# Patient Record
Sex: Female | Born: 1960 | Race: Black or African American | Hispanic: No | Smoking: Former smoker
Health system: Southern US, Community
[De-identification: ages and names within clinical notes are randomized; demographics above are authoritative.]

## PROBLEM LIST (undated history)

## (undated) DIAGNOSIS — C50919 Malignant neoplasm of unspecified site of unspecified female breast: Secondary | ICD-10-CM

## (undated) DIAGNOSIS — K5010 Crohn's disease of large intestine without complications: Secondary | ICD-10-CM

## (undated) DIAGNOSIS — K219 Gastro-esophageal reflux disease without esophagitis: Secondary | ICD-10-CM

## (undated) DIAGNOSIS — R7303 Prediabetes: Secondary | ICD-10-CM

## (undated) DIAGNOSIS — I10 Essential (primary) hypertension: Secondary | ICD-10-CM

## (undated) HISTORY — DX: Essential (primary) hypertension: I10

## (undated) HISTORY — PX: MYOMECTOMY: SHX85

## (undated) HISTORY — DX: Malignant neoplasm of unspecified site of unspecified female breast: C50.919

## (undated) HISTORY — PX: BREAST SURGERY: SHX581

## (undated) HISTORY — PX: TONSILLECTOMY: SUR1361

## (undated) HISTORY — PX: BUNIONECTOMY: SHX129

---

## 2006-02-23 ENCOUNTER — Encounter: Admission: RE | Admit: 2006-02-23 | Discharge: 2006-02-23 | Payer: Self-pay | Admitting: Obstetrics & Gynecology

## 2006-03-24 ENCOUNTER — Encounter: Admission: RE | Admit: 2006-03-24 | Discharge: 2006-03-24 | Payer: Self-pay | Admitting: Internal Medicine

## 2006-10-10 ENCOUNTER — Ambulatory Visit: Payer: Self-pay

## 2007-04-24 ENCOUNTER — Encounter: Admission: RE | Admit: 2007-04-24 | Discharge: 2007-04-24 | Payer: Self-pay | Admitting: Obstetrics & Gynecology

## 2007-05-02 ENCOUNTER — Encounter: Admission: RE | Admit: 2007-05-02 | Discharge: 2007-05-02 | Payer: Self-pay | Admitting: Obstetrics & Gynecology

## 2008-01-18 ENCOUNTER — Ambulatory Visit: Payer: Self-pay

## 2008-07-03 ENCOUNTER — Encounter: Admission: RE | Admit: 2008-07-03 | Discharge: 2008-07-03 | Payer: Self-pay | Admitting: Internal Medicine

## 2009-07-14 ENCOUNTER — Encounter: Admission: RE | Admit: 2009-07-14 | Discharge: 2009-07-14 | Payer: Self-pay | Admitting: Internal Medicine

## 2010-04-21 ENCOUNTER — Other Ambulatory Visit: Admission: RE | Admit: 2010-04-21 | Discharge: 2010-04-21 | Payer: Self-pay | Admitting: Internal Medicine

## 2010-07-19 ENCOUNTER — Encounter: Payer: Self-pay | Admitting: Obstetrics & Gynecology

## 2010-08-05 ENCOUNTER — Other Ambulatory Visit: Payer: Self-pay | Admitting: Internal Medicine

## 2010-08-05 DIAGNOSIS — Z1231 Encounter for screening mammogram for malignant neoplasm of breast: Secondary | ICD-10-CM

## 2010-08-12 ENCOUNTER — Ambulatory Visit
Admission: RE | Admit: 2010-08-12 | Discharge: 2010-08-12 | Disposition: A | Discharge status: Home / Self Care | Payer: Federal, State, Local not specified - PPO | Source: Ambulatory Visit | Attending: Internal Medicine | Admitting: Internal Medicine

## 2010-08-12 DIAGNOSIS — Z1231 Encounter for screening mammogram for malignant neoplasm of breast: Secondary | ICD-10-CM

## 2011-07-15 ENCOUNTER — Other Ambulatory Visit: Payer: Self-pay | Admitting: Internal Medicine

## 2011-07-15 DIAGNOSIS — Z1231 Encounter for screening mammogram for malignant neoplasm of breast: Secondary | ICD-10-CM

## 2011-08-20 ENCOUNTER — Ambulatory Visit: Payer: Federal, State, Local not specified - PPO

## 2011-08-27 ENCOUNTER — Ambulatory Visit
Admission: RE | Admit: 2011-08-27 | Discharge: 2011-08-27 | Disposition: A | Discharge status: Home / Self Care | Payer: Federal, State, Local not specified - PPO | Source: Ambulatory Visit | Attending: Internal Medicine | Admitting: Internal Medicine

## 2011-08-27 DIAGNOSIS — Z1231 Encounter for screening mammogram for malignant neoplasm of breast: Secondary | ICD-10-CM

## 2012-08-14 ENCOUNTER — Other Ambulatory Visit: Payer: Self-pay | Admitting: Internal Medicine

## 2012-08-14 DIAGNOSIS — Z1231 Encounter for screening mammogram for malignant neoplasm of breast: Secondary | ICD-10-CM

## 2012-09-01 ENCOUNTER — Ambulatory Visit: Payer: Federal, State, Local not specified - PPO

## 2012-09-22 ENCOUNTER — Other Ambulatory Visit (HOSPITAL_COMMUNITY)
Admission: RE | Admit: 2012-09-22 | Discharge: 2012-09-22 | Disposition: A | Discharge status: Home / Self Care | Payer: Federal, State, Local not specified - PPO | Source: Ambulatory Visit | Attending: Internal Medicine | Admitting: Internal Medicine

## 2012-09-22 DIAGNOSIS — Z01419 Encounter for gynecological examination (general) (routine) without abnormal findings: Secondary | ICD-10-CM | POA: Insufficient documentation

## 2012-10-27 ENCOUNTER — Ambulatory Visit: Payer: Federal, State, Local not specified - PPO

## 2012-12-08 ENCOUNTER — Ambulatory Visit
Admission: RE | Admit: 2012-12-08 | Discharge: 2012-12-08 | Disposition: A | Discharge status: Home / Self Care | Payer: Federal, State, Local not specified - PPO | Source: Ambulatory Visit | Attending: Internal Medicine | Admitting: Internal Medicine

## 2012-12-08 DIAGNOSIS — Z1231 Encounter for screening mammogram for malignant neoplasm of breast: Secondary | ICD-10-CM

## 2013-01-12 ENCOUNTER — Encounter: Payer: Self-pay | Admitting: Internal Medicine

## 2014-04-23 ENCOUNTER — Emergency Department (HOSPITAL_COMMUNITY)
Admission: EM | Admit: 2014-04-23 | Discharge: 2014-04-23 | Disposition: A | Discharge status: Home / Self Care | Payer: Federal, State, Local not specified - PPO | Source: Home / Self Care | Attending: Family Medicine | Admitting: Family Medicine

## 2014-04-23 ENCOUNTER — Encounter (HOSPITAL_COMMUNITY): Payer: Self-pay | Admitting: Emergency Medicine

## 2014-04-23 DIAGNOSIS — L237 Allergic contact dermatitis due to plants, except food: Secondary | ICD-10-CM

## 2014-04-23 DIAGNOSIS — IMO0001 Reserved for inherently not codable concepts without codable children: Secondary | ICD-10-CM

## 2014-04-23 DIAGNOSIS — R030 Elevated blood-pressure reading, without diagnosis of hypertension: Secondary | ICD-10-CM

## 2014-04-23 MED ORDER — PREDNISONE 5 MG PO KIT: PACK | ORAL | Status: DC

## 2014-04-23 MED ORDER — CLOBETASOL PROPIONATE 0.05 % EX OINT: 1.0000 "application " | TOPICAL_OINTMENT | Freq: Two times a day (BID) | CUTANEOUS | Status: DC

## 2014-04-23 NOTE — ED Provider Notes (Signed)
Rita Cole is a 53 y.o. female who presents to Urgent Care today for rash. Patient notes a several day history of rash on her bilateral forearms. Patient was trimming vines in her yard and thinks she was exposed to poison ivy which she is sensitive to. She has  tried over-the-counter anti-itch medicines which have helped a little. In the past prednisone and clobetasol have helped a lot. No mouth swelling or trouble breathing. No chest pains palpitations. Patient denies any history of hypertension.  History reviewed. No pertinent past medical history. History  Substance Use Topics  . Smoking status: Never Smoker   . Smokeless tobacco: Not on file  . Alcohol Use: No   ROS as above Medications: No current facility-administered medications for this encounter.   Current Outpatient Prescriptions  Medication Sig Dispense Refill  . clobetasol ointment (TEMOVATE) 6.07 % Apply 1 application topically 2 (two) times daily.  30 g  0  . PredniSONE 5 MG KIT 12 day dosepack po  1 kit  0    Exam:  BP 193/86  Pulse 73  Temp(Src) 98.1 F (36.7 C) (Oral)  Resp 18  SpO2 99% Gen: Well NAD HEENT: EOMI,  MMM no mouth swelling or oral lesions.  Lungs: Normal work of breathing. CTABL Heart: RRR no MRG Abd: NABS, Soft. Nondistended, Nontender Exts: Brisk capillary refill, warm and well perfused.  Skin: Vesicular street rash bilateral upper arms consistent with poison ivy dermatitis  No results found for this or any previous visit (from the past 24 hour(s)). No results found.  Assessment and Plan: 53 y.o. female with  irritant contact dermatitis due to poison ivy. Treat with prednisone Dosepak and clobetasol ointment.  Recommend patient follow up with the primary care provider sent for elevated blood pressure.  Discussed warning signs or symptoms. Please see discharge instructions. Patient expresses understanding.     Gregor Hams, MD 04/23/14 [phone]

## 2014-04-23 NOTE — ED Notes (Signed)
C/o rash on arms and hands.  States taking off branches of trees over the weekend.  gradually getting worse and having irritation.

## 2014-04-23 NOTE — Discharge Instructions (Signed)
Thank you for coming in today. Take prednisone daily for 12 days. Use clobetasol ointment as needed. Follow-up with your primary care provider soon for blood pressure issues. Call or go to the emergency room if you get worse, have trouble breathing, have chest pains, or palpitations.    Poison [employer] ivy is a inflammation of the skin (contact dermatitis) caused by touching the allergens on the leaves of the ivy plant following previous exposure to the plant. The rash usually appears 48 hours after exposure. The rash is usually bumps (papules) or blisters (vesicles) in a linear pattern. Depending on your own sensitivity, the rash may simply cause redness and itching, or it may also progress to blisters which may break open. These must be well cared for to prevent secondary bacterial (germ) infection, followed by scarring. Keep any open areas dry, clean, dressed, and covered with an antibacterial ointment if needed. The eyes may also get puffy. The puffiness is worst in the morning and gets better as the day progresses. This dermatitis usually heals without scarring, within 2 to 3 weeks without treatment. HOME CARE INSTRUCTIONS  Thoroughly wash with soap and water as soon as you have been exposed to poison ivy. You have about one half hour to remove the plant resin before it will cause the rash. This washing will destroy the oil or antigen on the skin that is causing, or will cause, the rash. Be sure to wash under your fingernails as any plant resin there will continue to spread the rash. Do not rub skin vigorously when washing affected area. Poison ivy cannot spread if no oil from the plant remains on your body. A rash that has progressed to weeping sores will not spread the rash unless you have not washed thoroughly. It is also important to wash any clothes you have been wearing as these may carry active allergens. The rash will return if you wear the unwashed clothing, even several days  later. Avoidance of the plant in the future is the best measure. Poison ivy plant can be recognized by the number of leaves. Generally, poison ivy has three leaves with flowering branches on a single stem. Diphenhydramine may be purchased over the counter and used as needed for itching. Do not drive with this medication if it makes you drowsy.Ask your caregiver about medication for children. SEEK MEDICAL CARE IF:  Open sores develop.  Redness spreads beyond area of rash.  You notice purulent (pus-like) discharge.  You have increased pain.  Other signs of infection develop (such as fever). Document Released: 06/11/2000 Document Revised: 09/06/2011 Document Reviewed: 11/22/2008 [facility] Patient Information 2015 [place], [place]. This information is not intended to replace advice given to you by your health care provider. Make sure you discuss any questions you have with your health care provider.

## 2014-07-24 ENCOUNTER — Other Ambulatory Visit (INDEPENDENT_AMBULATORY_CARE_PROVIDER_SITE_OTHER): Payer: Self-pay | Admitting: Surgery

## 2014-11-14 ENCOUNTER — Encounter (HOSPITAL_COMMUNITY): Payer: Self-pay | Admitting: Emergency Medicine

## 2014-11-14 ENCOUNTER — Emergency Department (HOSPITAL_COMMUNITY)
Admission: EM | Admit: 2014-11-14 | Discharge: 2014-11-14 | Disposition: A | Discharge status: Home / Self Care | Payer: Federal, State, Local not specified - PPO | Source: Home / Self Care

## 2014-11-14 DIAGNOSIS — L200 Besnier's prurigo: Secondary | ICD-10-CM | POA: Diagnosis not present

## 2014-11-14 DIAGNOSIS — L239 Allergic contact dermatitis, unspecified cause: Secondary | ICD-10-CM

## 2014-11-14 MED ORDER — DEXAMETHASONE 4 MG PO TABS
ORAL_TABLET | ORAL | Status: AC
  Filled 2014-11-14: qty 2

## 2014-11-14 MED ORDER — PREDNISONE 50 MG PO TABS: ORAL_TABLET | ORAL | Status: DC

## 2014-11-14 MED ORDER — DEXAMETHASONE 2 MG PO TABS
ORAL_TABLET | ORAL | Status: AC
  Filled 2014-11-14: qty 1

## 2014-11-14 MED ORDER — DEXAMETHASONE 4 MG PO TABS
10.0000 mg | ORAL_TABLET | Freq: Once | ORAL | Status: AC
  Administered 2014-11-14: 10 mg via ORAL

## 2014-11-14 NOTE — Discharge Instructions (Signed)
Your rash is likely due to something he came into contact with her environment. They're given a dose of steroids in our clinic to help start the healing process. Please take your steroids as prescribed for the full dose. Please use Benadryl 50 mg for additional relief.

## 2014-11-14 NOTE — ED Notes (Signed)
Reports working out in the yard on Sunday.  Developed a rash on hands, arms, and face.  Pt has used otc creams/ointments with no relief.

## 2014-11-14 NOTE — ED Provider Notes (Addendum)
CSN: [id number]     Arrival date & time 11/14/14  1543 History   None    Chief Complaint  Patient presents with  . Rash   (Consider location/radiation/quality/duration/timing/severity/associated sxs/prior Treatment) HPI  Patient was working in her yard on Sunday when she started developing a tingling sensation in her skin. Was working on trimming bushes and trees. Following day she developed itchy rash. Rarely on her arms and now on her face. Clear pustules. Nontender. Calamine lotion without improvement. Benadryl with some improvement in the itch. Progressive and constant.  Denies dysphagia, dyspnea, shortness breath, palpitations, tongue swelling, runny nose, congestion, cough, chest pain, bone pain, dysuria, back pain.  History reviewed. No pertinent past medical history. History reviewed. No pertinent past surgical history. Family History  Problem Relation Age of Onset  . Heart attack Father    History  Substance Use Topics  . Smoking status: Never Smoker   . Smokeless tobacco: Not on file  . Alcohol Use: No   OB History    No data available     Review of Systems Per HPI with all other pertinent systems negative.   Allergies  Review of patient's allergies indicates no known allergies.  Home Medications   Prior to Admission medications   Medication Sig Start Date End Date Taking? Authorizing Provider  clobetasol ointment (TEMOVATE) 5.99 % Apply 1 application topically 2 (two) times daily. 04/23/14   Gregor Hams, MD  predniSONE (DELTASONE) 50 MG tablet Take daily with breakfast 11/14/14   Waldemar Dickens, MD   BP 197/77 mmHg  Pulse 81  Temp(Src) 97.9 F (36.6 C) (Oral)  Resp 18  SpO2 95% Physical Exam Physical Exam  Constitutional: oriented to person, place, and time. appears well-developed and well-nourished. No distress.  HENT:  Head: Normocephalic and atraumatic.  Eyes: EOMI. PERRL.  Neck: Normal range of motion.  Cardiovascular: RRR, no m/r/g, 2+ distal  pulses,  Pulmonary/Chest: Effort normal and breath sounds normal. No respiratory distress.  Abdominal: Soft. Bowel sounds are normal. NonTTP, no distension.  Musculoskeletal: Normal range of motion. Non ttp, no effusion.  Neurological: alert and oriented to person, place, and time.  Skin: Maculopapular rash on arms and face with few clear vesicles.Marland Kitchen  Psychiatric: normal mood and affect. behavior is normal. Judgment and thought content normal.    ED Course  Procedures (including critical care time) Labs Review Labs Reviewed - No data to display  Imaging Review No results found.   MDM   1. Allergic dermatitis    Decadron 10 mg by mouth in office.  Continue Benadryl 50 mg. Start steroid Dosepak. Precautions given and all questions answered.  Hypertension: Blood pressure elevated likely secondary to acute illness and patient stress. Patient to follow-up with PCP if remains elevated in the future.  Waldemar Dickens, MD 11/14/14 1649  Waldemar Dickens, MD 11/14/14 [phone]

## 2017-03-18 ENCOUNTER — Ambulatory Visit (HOSPITAL_COMMUNITY)
Admission: EM | Admit: 2017-03-18 | Discharge: 2017-03-18 | Disposition: A | Discharge status: Home / Self Care | Payer: Federal, State, Local not specified - PPO | Attending: Internal Medicine | Admitting: Internal Medicine

## 2017-03-18 ENCOUNTER — Encounter (HOSPITAL_COMMUNITY): Payer: Self-pay | Admitting: Emergency Medicine

## 2017-03-18 DIAGNOSIS — L237 Allergic contact dermatitis due to plants, except food: Secondary | ICD-10-CM

## 2017-03-18 MED ORDER — TRIAMCINOLONE ACETONIDE 0.1 % EX CREA: 1.0000 "application " | TOPICAL_CREAM | Freq: Two times a day (BID) | CUTANEOUS | 0 refills | Status: DC

## 2017-03-18 NOTE — Discharge Instructions (Signed)
Apply the triamcinolone cream to the areas affected as we discussed. Do this twice a day. In between those application she may also use Benadryl gel or cream. Cold compresses neck to the eye and face will help with swelling and itching and burning.

## 2017-03-18 NOTE — ED Provider Notes (Signed)
[place]    CSN: [id number] Arrival date & time: 03/18/17  1641     History   Chief Complaint Chief Complaint  Patient presents with  . Rash    HPI Rita Cole is a 56 y.o. female.   Partially 5 days ago this patient noticed some swelling rash and itchiness in the right periorbital area left upper cheek and a small area along the neckline. This occurred after she was cleaning up some brush in the yard. In the morning she wakes up with puffiness and swelling to the left face and underneath the left eye. Denies actual problems or visual problems. She has tried Benadryl topically without much relief. No pain just itching.      History reviewed. No pertinent past medical history.  There are no active problems to display for this patient.   History reviewed. No pertinent surgical history.  OB History    No data available       Home Medications    Prior to Admission medications   Medication Sig Start Date End Date Taking? Authorizing Provider  clobetasol ointment (TEMOVATE) 0.99 % Apply 1 application topically 2 (two) times daily. 04/23/14   Gregor Hams, MD  predniSONE (DELTASONE) 50 MG tablet Take daily with breakfast 11/14/14   Waldemar Dickens, MD  triamcinolone cream (KENALOG) 0.1 % Apply 1 application topically 2 (two) times daily. 03/18/17   Janne Napoleon, NP    Family History Family History  Problem Relation Age of Onset  . Heart attack Father     Social History Social History  Substance Use Topics  . Smoking status: Never Smoker  . Smokeless tobacco: Never Used  . Alcohol use No     Allergies   Patient has no known allergies.   Review of Systems Review of Systems  Constitutional: Negative.   HENT: Negative for congestion, ear pain, postnasal drip, sore throat and tinnitus.        Minor swelling/bagginess under the left eye and left cheek.  Respiratory: Negative.   Gastrointestinal: Negative.   Skin: Positive for rash.    Neurological: Negative.   All other systems reviewed and are negative.    Physical Exam Triage Vital Signs ED Triage Vitals  Enc Vitals Group     BP 03/18/17 1806 (!) 157/86     Pulse Rate 03/18/17 1806 83     Resp 03/18/17 1806 20     Temp 03/18/17 1806 98.4 F (36.9 C)     Temp Source 03/18/17 1806 Oral     SpO2 03/18/17 1806 98 %     Weight --      Height --      Head Circumference --      Peak Flow --      Pain Score 03/18/17 1807 5     Pain Loc --      Pain Edu? --      Excl. in [place]? --    No data found.   Updated Vital Signs BP (!) 157/86 (BP Location: Left Arm)   Pulse 83   Temp 98.4 F (36.9 C) (Oral)   Resp 20   SpO2 98%   Visual Acuity Right Eye Distance:   Left Eye Distance:   Bilateral Distance:    Right Eye Near:   Left Eye Near:    Bilateral Near:     Physical Exam  Constitutional: She is oriented to person, place, and time. She appears well-developed and well-nourished. No distress.  Eyes: EOM are normal.  Neck: Normal range of motion. Neck supple.  Cardiovascular: Normal rate.   Pulmonary/Chest: Effort normal. No respiratory distress.  Musculoskeletal: She exhibits no edema.  Neurological: She is alert and oriented to person, place, and time. She exhibits normal muscle tone.  Skin: Skin is warm and dry. Capillary refill takes less than 2 seconds.  As per history of present illness. Slightly rough and papular rash to the upper eyelid, beneath the left lower eyelid and cheek. A ring of similar rash around the right anterolateral neck.  Psychiatric: She has a normal mood and affect.  Nursing note and vitals reviewed.    UC Treatments / Results  Labs (all labs ordered are listed, but only abnormal results are displayed) Labs Reviewed - No data to display  EKG  EKG Interpretation None       Radiology No results found.  Procedures Procedures (including critical care time)  Medications Ordered in UC Medications - No data to  display   Initial Impression / Assessment and Plan / UC Course  I have reviewed the triage vital signs and the nursing notes.  Pertinent labs & imaging results that were available during my care of the patient were reviewed by me and considered in my medical decision making (see chart for details).    Apply the triamcinolone cream to the areas affected as we discussed. Do this twice a day. In between those application she may also use Benadryl gel or cream. Cold compresses neck to the eye and face will help with swelling and itching and burning.    Final Clinical Impressions(s) / UC Diagnoses   Final diagnoses:  Allergic contact dermatitis due to plants, except food    New Prescriptions New Prescriptions   TRIAMCINOLONE CREAM (KENALOG) 0.1 %    Apply 1 application topically 2 (two) times daily.   Discharge vital provider 1927 hrs.  Controlled Substance Prescriptions [place] Controlled Substance Registry consulted? Not Applicable   Janne Napoleon, NP 03/18/17 [phone]
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
## 2017-05-29 ENCOUNTER — Encounter (HOSPITAL_COMMUNITY): Payer: Self-pay | Admitting: *Deleted

## 2017-05-29 ENCOUNTER — Ambulatory Visit (HOSPITAL_COMMUNITY)
Admission: EM | Admit: 2017-05-29 | Discharge: 2017-05-29 | Disposition: A | Discharge status: Home / Self Care | Payer: Federal, State, Local not specified - PPO | Attending: Urgent Care | Admitting: Urgent Care

## 2017-05-29 ENCOUNTER — Other Ambulatory Visit: Payer: Self-pay

## 2017-05-29 DIAGNOSIS — J019 Acute sinusitis, unspecified: Secondary | ICD-10-CM

## 2017-05-29 DIAGNOSIS — Z9109 Other allergy status, other than to drugs and biological substances: Secondary | ICD-10-CM

## 2017-05-29 DIAGNOSIS — I10 Essential (primary) hypertension: Secondary | ICD-10-CM

## 2017-05-29 DIAGNOSIS — J3489 Other specified disorders of nose and nasal sinuses: Secondary | ICD-10-CM

## 2017-05-29 DIAGNOSIS — R030 Elevated blood-pressure reading, without diagnosis of hypertension: Secondary | ICD-10-CM

## 2017-05-29 MED ORDER — CETIRIZINE HCL 10 MG PO TABS: 10.0000 mg | ORAL_TABLET | Freq: Every day | ORAL | 11 refills | Status: DC

## 2017-05-29 MED ORDER — AMOXICILLIN 500 MG PO CAPS: 500.0000 mg | ORAL_CAPSULE | Freq: Three times a day (TID) | ORAL | 0 refills | Status: DC

## 2017-05-29 NOTE — ED Triage Notes (Addendum)
Per pt she thinks she has sinus infection, per pt her right side is more affected.

## 2017-05-29 NOTE — ED Provider Notes (Signed)
    MRN: [id number] DOB: 11-07-1960  Subjective:   Rita Cole is a 56 y.o. female presenting for 4 day history of worsening right sided sinus pain, facial pain, right ear fullness, nasal congestion (R>L), mild dry cough. Has been using nasal saline and Flonase to address allergies which have been difficult to control for patient this fall season. She has also tried [place] flu. Denies smoking cigarettes.   Cassara takes [facility] and has No Known Allergies.  Jenevie has pmh of HTN. Denies past surgical history.  Objective:   Vitals: BP (!) 155/80 (BP Location: Left Arm) Comment: large cuff  Pulse 78   Temp 97.9 F (36.6 C) (Oral)   Resp (!) 24   SpO2 97%   BP Readings from Last 3 Encounters:  05/29/17 (!) 155/80  03/18/17 (!) 157/86  11/14/14 197/77    Physical Exam  Constitutional: She is oriented to person, place, and time. She appears well-developed and well-nourished.  HENT:  TM's intact bilaterally, no effusions or erythema. Nasal turbinates pink and moist, nasal passages patent. Right sided maxillary sinus tenderness. Throat with moderate post-nasal drainage, mucous membranes moist.   Eyes: Right eye exhibits discharge (clear). Left eye exhibits no discharge.  Neck: Normal range of motion. Neck supple.  Cardiovascular: Normal rate.  Pulmonary/Chest: Effort normal.  Lymphadenopathy:    She has no cervical adenopathy.  Neurological: She is alert and oriented to person, place, and time.  Skin: Skin is warm and dry.   Assessment and Plan :   Acute sinusitis, recurrence not specified, unspecified location  Sinus pain  Environmental allergies  Essential hypertension  Elevated blood pressure reading  Will manage sinusitis secondary to difficult to control allergies with amoxicillin. Hold Flonase for now but use Zyrtec. Recommended supportive care otherwise. Return-to-clinic precautions discussed, patient verbalized understanding. Counseled patient on risks of  uncontrolled HTN. She will consider establishing care for management of her BP.  Jaynee Eagles, PA-C [facility]  05/29/2017  3:55 PM    Jaynee Eagles, PA-C 05/29/17 1624

## 2017-05-29 NOTE — Discharge Instructions (Signed)
If you are interested in letting me help you with your healthcare, please call and set up an appointment to establish care.  [place]

## 2020-09-06 ENCOUNTER — Emergency Department (HOSPITAL_COMMUNITY): Payer: Federal, State, Local not specified - PPO

## 2020-09-06 ENCOUNTER — Other Ambulatory Visit: Payer: Self-pay

## 2020-09-06 ENCOUNTER — Emergency Department (HOSPITAL_COMMUNITY)
Admission: EM | Admit: 2020-09-06 | Discharge: 2020-09-07 | Disposition: A | Discharge status: Home / Self Care | Payer: Federal, State, Local not specified - PPO | Attending: Emergency Medicine | Admitting: Emergency Medicine

## 2020-09-06 DIAGNOSIS — M25552 Pain in left hip: Secondary | ICD-10-CM | POA: Insufficient documentation

## 2020-09-06 DIAGNOSIS — I10 Essential (primary) hypertension: Secondary | ICD-10-CM | POA: Diagnosis not present

## 2020-09-06 DIAGNOSIS — M47812 Spondylosis without myelopathy or radiculopathy, cervical region: Secondary | ICD-10-CM

## 2020-09-06 DIAGNOSIS — R030 Elevated blood-pressure reading, without diagnosis of hypertension: Secondary | ICD-10-CM

## 2020-09-06 DIAGNOSIS — M25532 Pain in left wrist: Secondary | ICD-10-CM | POA: Diagnosis not present

## 2020-09-06 DIAGNOSIS — R000 Tachycardia, unspecified: Secondary | ICD-10-CM | POA: Insufficient documentation

## 2020-09-06 DIAGNOSIS — M542 Cervicalgia: Secondary | ICD-10-CM | POA: Diagnosis present

## 2020-09-06 DIAGNOSIS — M4692 Unspecified inflammatory spondylopathy, cervical region: Secondary | ICD-10-CM | POA: Diagnosis not present

## 2020-09-06 LAB — CBC WITH DIFFERENTIAL/PLATELET
Abs Immature Granulocytes: 0.03 10*3/uL (ref 0.00–0.07)
Basophils Absolute: 0.1 10*3/uL (ref 0.0–0.1)
Basophils Relative: 1 %
Eosinophils Absolute: 0.1 10*3/uL (ref 0.0–0.5)
Eosinophils Relative: 1 %
HCT: 42.4 % (ref 36.0–46.0)
Hemoglobin: 13.5 g/dL (ref 12.0–15.0)
Immature Granulocytes: 0 %
Lymphocytes Relative: 14 %
Lymphs Abs: 1.3 10*3/uL (ref 0.7–4.0)
MCH: 28.2 pg (ref 26.0–34.0)
MCHC: 31.8 g/dL (ref 30.0–36.0)
MCV: 88.7 fL (ref 80.0–100.0)
Monocytes Absolute: 1 10*3/uL (ref 0.1–1.0)
Monocytes Relative: 10 %
Neutro Abs: 6.9 10*3/uL (ref 1.7–7.7)
Neutrophils Relative %: 74 %
Platelets: 259 10*3/uL (ref 150–400)
RBC: 4.78 MIL/uL (ref 3.87–5.11)
RDW: 14.6 % (ref 11.5–15.5)
WBC: 9.3 10*3/uL (ref 4.0–10.5)
nRBC: 0 % (ref 0.0–0.2)

## 2020-09-06 LAB — SEDIMENTATION RATE: Sed Rate: 65 mm/hr — ABNORMAL HIGH (ref 0–22)

## 2020-09-06 LAB — BASIC METABOLIC PANEL
Anion gap: 12 (ref 5–15)
BUN: 20 mg/dL (ref 6–20)
CO2: 26 mmol/L (ref 22–32)
Calcium: 9.6 mg/dL (ref 8.9–10.3)
Chloride: 99 mmol/L (ref 98–111)
Creatinine, Ser: 1.01 mg/dL — ABNORMAL HIGH (ref 0.44–1.00)
GFR, Estimated: >60 mL/min (ref >60)
Glucose, Bld: 90 mg/dL (ref 70–99)
Potassium: 4.8 mmol/L (ref 3.5–5.1)
Sodium: 137 mmol/L (ref 135–145)

## 2020-09-06 LAB — C-REACTIVE PROTEIN: CRP: 6.8 mg/dL — ABNORMAL HIGH (ref <1.0)

## 2020-09-06 MED ORDER — KETOROLAC TROMETHAMINE 60 MG/2ML IM SOLN
60.0000 mg | Freq: Once | INTRAMUSCULAR | Status: AC
  Administered 2020-09-06: 60 mg via INTRAMUSCULAR
  Filled 2020-09-06: qty 2

## 2020-09-06 MED ORDER — KETOROLAC TROMETHAMINE 10 MG PO TABS: 10.0000 mg | ORAL_TABLET | Freq: Four times a day (QID) | ORAL | 0 refills | Status: DC | PRN

## 2020-09-06 NOTE — ED Provider Notes (Signed)
Care assumed from Dr. Joya Gaskins, patient with pains in multiple areas which are probably secondary to osteoarthritis but pending CRP and sedimentation rate.  She did get good pain relief with ketorolac.  Both sedimentation rate and CRP are moderately elevated.  Her symptoms are consistent with polymyalgia rheumatica and will start treatment with prednisone.  Also, blood pressure is significantly elevated.  Patient states that she does check her blood pressure at home and pressures frequently elevated to almost 847 systolic.  She clearly will need to be started on medication for her hypertension and she is given a prescription for hydrochlorothiazide.  She is to see her primary care provider in the next week at which time blood pressure can be rechecked and she can arrange for referral to rheumatology.   Rita Fuel, MD 20/72/18 [phone]

## 2020-09-06 NOTE — ED Provider Notes (Signed)
[facility] DEPT Provider Note   CSN: [id number] Arrival date & time: 09/06/20  2013     History Chief Complaint  Patient presents with   Groin Pain   Torticollis   Back Pain    Pain in groin, stiff neck and back, and difficultly getting around     Eudell L Bonello is a 60 y.o. female.  Patient reports that she has multiple sites of musculoskeletal pain.  No history of injury.  She states that it is just like something came over her.  She has seen her primary care physician, and physical therapy has been prescribed.  She did 6 sessions without relief, and she has done home exercises.  Main sites of pain include her neck, left wrist, and left hip.  She has had chronic knee pain, and this seems unrelated to her current issue.  No history of inflammatory arthritis.  The history is provided by the patient.  Back Pain Pain location: cervical spine. Quality:  Stiffness Stiffness is present:  All day Radiates to:  R shoulder and L shoulder Pain severity:  Severe Pain is:  Same all the time Onset quality:  Gradual Duration: worse for the past 1 week. Timing:  Constant Progression:  Worsening Chronicity:  New Context: not recent illness and not recent injury   Relieved by:  Nothing Worsened by:  Movement and bending Ineffective treatments:  Muscle relaxants (physical therapy) Associated symptoms: no abdominal pain, no bladder incontinence, no bowel incontinence, no chest pain, no dysuria, no fever, no numbness, no paresthesias, no tingling and no weight loss        No past medical history on file.  There are no problems to display for this patient.   No past surgical history on file.   OB History   No obstetric history on file.     Family History  Problem Relation Age of Onset   Heart attack Father     Social History   Tobacco Use   Smoking status: Never Smoker   Smokeless tobacco: Never Used  Vaping Use   Vaping Use:  Never used  Substance Use Topics   Alcohol use: No   Drug use: No    Home Medications Prior to Admission medications   Medication Sig Start Date End Date Taking? Authorizing Provider  amoxicillin (AMOXIL) 500 MG capsule Take 1 capsule (500 mg total) by mouth 3 (three) times daily. 05/29/17   Jaynee Eagles, PA-C  cetirizine (ZYRTEC ALLERGY) 10 MG tablet Take 1 tablet (10 mg total) by mouth daily. 05/29/17   Jaynee Eagles, PA-C    Allergies    Patient has no known allergies.  Review of Systems   Review of Systems  Constitutional: Negative for chills, fever and weight loss.  HENT: Negative for ear pain and sore throat.   Eyes: Negative for pain and visual disturbance.  Respiratory: Negative for cough and shortness of breath.   Cardiovascular: Negative for chest pain and palpitations.  Gastrointestinal: Negative for abdominal pain, bowel incontinence and vomiting.  Genitourinary: Negative for bladder incontinence, dysuria and hematuria.  Musculoskeletal: Positive for arthralgias (left wrist pain and swelling, left hip pain), back pain, gait problem (has to hold her left lower abdomen/groin when walking due to pain) and neck stiffness.  Skin: Negative for color change and rash.  Neurological: Negative for tingling, seizures, syncope, numbness and paresthesias.  All other systems reviewed and are negative.   Physical Exam Updated Vital Signs BP (!) 202/123 (BP Location: Right  Arm)    Pulse (!) 109    Temp 98.9 F (37.2 C) (Oral)    Resp 18    Ht 5\' 6"  (1.676 m)    Wt 102.1 kg    SpO2 97%    BMI 36.32 kg/m   Physical Exam Vitals and nursing note reviewed.  Constitutional:      Comments: Moves stiffly, appears uncomfortable  HENT:     Head: Normocephalic and atraumatic.  Eyes:     General: No scleral icterus. Neck:     Comments: Decreased ROM for all with increased pain with leftward rotation, flexion, and extension of the neck.  Pulmonary:     Effort: Pulmonary effort is normal.  No respiratory distress.  Abdominal:     Palpations: Abdomen is soft.     Tenderness: There is no abdominal tenderness.  Musculoskeletal:     Left wrist: No effusion. Decreased range of motion.     Cervical back: Muscular tenderness present. Decreased range of motion.     Left hip: Decreased range of motion.     Comments: Increased pain with flexion and extension of the left wrist Increased pain with hip flexion and internal rotation  All extremities are warm and well-perfused.  Skin:    General: Skin is warm and dry.  Neurological:     Mental Status: She is alert.  Psychiatric:        Mood and Affect: Mood normal.     ED Results / Procedures / Treatments   Labs (all labs ordered are listed, but only abnormal results are displayed) Labs Reviewed  BASIC METABOLIC PANEL - Abnormal; Notable for the following components:      Result Value   Creatinine, Ser 1.01 (*)    All other components within normal limits  SEDIMENTATION RATE - Abnormal; Notable for the following components:   Sed Rate 65 (*)    All other components within normal limits  C-REACTIVE PROTEIN - Abnormal; Notable for the following components:   CRP 6.8 (*)    All other components within normal limits  CBC WITH DIFFERENTIAL/PLATELET    EKG None  Radiology DG Cervical Spine Complete  Result Date: 09/06/2020 CLINICAL DATA:  Neck pain EXAM: CERVICAL SPINE - COMPLETE 4+ VIEW COMPARISON:  None. FINDINGS: Degenerative disc disease is greatest at C4-6 with anterior osteophytes. No visible fracture. The dens is intact. Lateral masses of C1 and C2 are aligned. IMPRESSION: Degenerative disc disease, greatest at C4-6. Electronically Signed   By: Ulyses Jarred M.D.   On: 09/06/2020 20:53   DG Wrist Complete Left  Result Date: 09/06/2020 CLINICAL DATA:  Left wrist swelling. EXAM: LEFT WRIST - COMPLETE 3+ VIEW COMPARISON:  None. FINDINGS: There is no evidence of fracture or dislocation. Degenerative changes are seen  within the lateral aspect of the left wrist and at the carpometacarpal articulation of the left thumb. Mild diffuse soft tissue swelling is noted. IMPRESSION: Degenerative changes without evidence of an acute osseous abnormality. Electronically Signed   By: Virgina Norfolk M.D.   On: 09/06/2020 20:57   DG Hip Unilat With Pelvis 2-3 Views Left  Result Date: 09/06/2020 CLINICAL DATA:  Left-sided groin pain EXAM: DG HIP (WITH OR WITHOUT PELVIS) 2-3V LEFT COMPARISON:  None. FINDINGS: There is no evidence of hip fracture or dislocation. There is no evidence of arthropathy or other focal bone abnormality. IMPRESSION: Negative. Electronically Signed   By: Ulyses Jarred M.D.   On: 09/06/2020 20:55    Procedures Procedures  Medications Ordered in ED Medications  ketorolac (TORADOL) injection 60 mg (has no administration in time range)    ED Course  I have reviewed the triage vital signs and the nursing notes.  Pertinent labs & imaging results that were available during my care of the patient were reviewed by me and considered in my medical decision making (see chart for details).  Clinical Course as of 09/06/20 2303  Sat Sep 06, 2020  2211 DG Hip Unilat With Pelvis 2-3 Views Left [AW]    Clinical Course User Index [AW] Arnaldo Natal, MD   MDM Rules/Calculators/A&P                          The patient is 60 years old.  She presents with a variety of musculoskeletal sources of pain.  Upon arrival to the ED she was noted to be mildly tachycardic and hypertensive.  She appeared to be in quite a bit of pain, and at this point it appears her vital sign abnormalities are related to her pain.  While I did consider other sources of pain, her symptoms seem to be worse with movement of her neck and the other affected joints.  Therefore, I think it is most likely these are musculoskeletal in etiology.  Imaging does correlate her symptoms with abnormalities.  Her cervical spine has quite extensive  arthritis, and she also has some arthritis of her left wrist.  The patient does not have significant arthritis of her hip.  I ordered labs to assess for the presence or absence of any inflammatory markers.  She understands these are very broad test and that she may need further testing.  We talked about inflammatory arthropathies, and she will ask her primary care doctor about a referral to rheumatology.  She did achieve symptom relief with anti-inflammatories, and I prescribed a short course of these upon discharge. No evidence of septic arthritis. Final Clinical Impression(s) / ED Diagnoses Final diagnoses:  Cervical spine arthritis  Arthralgia of left wrist  Pain of left hip joint    Rx / DC Orders ED Discharge Orders         Ordered    ketorolac (TORADOL) 10 MG tablet  Every 6 hours PRN        09/06/20 2302           Arnaldo Natal, MD 09/07/20 1315

## 2020-09-06 NOTE — ED Triage Notes (Signed)
Patient arrived POV. A&O x4 Pain in groin area, back, neck, and arms. Patient has been having difficulty moving. L wrist swollen. Patient is taking physical therapy. But pain has is recurrent.

## 2020-09-06 NOTE — Discharge Instructions (Addendum)
Please consider asking your primary care physician to refer you to a rheumatologist.  Please check your blood pressure once a day and keep a record of it.  Take that record with you when you see your primary care provider.  Return if your symptoms are getting worse.

## 2020-09-07 MED ORDER — PREDNISONE 5 MG PO TABS: 15.0000 mg | ORAL_TABLET | Freq: Every day | ORAL | 0 refills | Status: DC

## 2020-09-07 MED ORDER — PREDNISONE 5 MG PO TABS
15.0000 mg | ORAL_TABLET | Freq: Once | ORAL | Status: AC
  Administered 2020-09-07: 15 mg via ORAL
  Filled 2020-09-07: qty 1

## 2020-09-07 MED ORDER — HYDROCHLOROTHIAZIDE 25 MG PO TABS: 25.0000 mg | ORAL_TABLET | Freq: Every day | ORAL | 0 refills | Status: DC

## 2020-09-07 MED ORDER — MELOXICAM 15 MG PO TABS: 15.0000 mg | ORAL_TABLET | Freq: Every day | ORAL | 0 refills | Status: DC

## 2020-09-26 ENCOUNTER — Telehealth: Payer: Self-pay | Admitting: Hematology and Oncology

## 2020-09-26 ENCOUNTER — Encounter: Payer: Self-pay | Admitting: *Deleted

## 2020-09-26 NOTE — Telephone Encounter (Signed)
LVM for patient in reference to afternoon [facility] appointment for 4/6, patient to arrive at 1215, left my contact information for patient to return call

## 2020-09-30 ENCOUNTER — Encounter: Payer: Self-pay | Admitting: *Deleted

## 2020-09-30 ENCOUNTER — Other Ambulatory Visit: Payer: Self-pay | Admitting: *Deleted

## 2020-09-30 ENCOUNTER — Encounter: Payer: Self-pay | Admitting: Nurse Practitioner

## 2020-09-30 DIAGNOSIS — C50412 Malignant neoplasm of upper-outer quadrant of left female breast: Secondary | ICD-10-CM

## 2020-09-30 NOTE — Progress Notes (Signed)
[facility] NOTE  Patient Care Team: Riki Sheer, NP as PCP - General (Nurse Practitioner) Mauro Kaufmann, RN as Oncology Nurse Navigator Rockwell Germany, RN as Oncology Nurse Navigator Rolm Bookbinder, MD as Consulting Physician (General Surgery) Nicholas Lose, MD as Consulting Physician (Hematology and Oncology) Kyung Rudd, MD as Consulting Physician (Radiation Oncology)  CHIEF COMPLAINTS/PURPOSE OF CONSULTATION:  Newly diagnosed breast cancer  HISTORY OF PRESENTING ILLNESS:  Rita Cole 60 y.o. female is here because of recent diagnosis of left breast cancer. Screening mammogram on 09/12/20 showed a focal asymmetry in the left breast and multiple enlarged left axillary lymph nodes. Diagnostic mammogram and [place] on 09/25/20 showed a 5.0cm left breast asymmetry at the 1-2 o'clock position with skin thickening and 3 enlarged lymph nodes, largest 5.0cm. She presents to the clinic today for initial evaluation and discussion of treatment options.  Patient also has not she did not feel any problems until she came for her routine mammogram and Dr. Georgiann Cocker at [facility] mammography detected that her left breast was much larger and there was a lot of lymphedema and noticed some redness as well.  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  To her clinical examination it does look like peau d'orange but I do not feel that she meets the clinical criteria for inflammatory breast cancer.  I reviewed her records extensively and collaborated the history with the patient.  SUMMARY OF ONCOLOGIC HISTORY: Oncology History  Malignant neoplasm of upper-outer quadrant of left breast in female, estrogen receptor negative ([place])  09/30/2020 Initial Diagnosis   Swollen left breast: Mammogram detected left breast mass UOQ skin thickening with 3 abnormal lymph nodes that are matted and hard, ultrasound 2.7 cm, 2.9 cm, 3 cm and 3 lymph nodes biopsy revealed  grade 2-3 IDC with DCIS ER/PR negative HER-2 IHC equivocal FISH pending, Ki-67 60%, lymph node biopsy positive   10/01/2020 Cancer Staging   Staging form: Breast, AJCC 8th Edition - Clinical stage from 10/01/2020: Stage IIIC (cT4d, cN2a, cM0, G3, ER-, PR-, HER2: Equivocal) - Signed by Nicholas Lose, MD on 10/01/2020 Histologic grading system: 3 grade system     MEDICAL HISTORY:  Past Medical History:  Diagnosis Date  . Breast cancer ([place])   . Hypertension     SURGICAL HISTORY: Past Surgical History:  Procedure Laterality Date  . BUNIONECTOMY    . MYOMECTOMY      SOCIAL HISTORY: Social History   Socioeconomic History  . Marital status: Divorced    Spouse name: Not on file  . Number of children: Not on file  . Years of education: Not on file  . Highest education level: Not on file  Occupational History  . Not on file  Tobacco Use  . Smoking status: Former Smoker    Packs/day: 0.25  . Smokeless tobacco: Never Used  Vaping Use  . Vaping Use: Never used  Substance and Sexual Activity  . Alcohol use: No  . Drug use: No  . Sexual activity: Yes  Other Topics Concern  . Not on file  Social History Narrative  . Not on file   Social Determinants of Health   Financial Resource Strain: Not on file  Food Insecurity: Not on file  Transportation Needs: Not on file  Physical Activity: Not on file  Stress: Not on file  Social Connections: Not on file  Intimate Partner Violence: Not on file    FAMILY HISTORY: Family History  Problem Relation Age of  Onset  . Heart attack Father   . Lung cancer Father     ALLERGIES:  has No Known Allergies.  MEDICATIONS:  Current Outpatient Medications  Medication Sig Dispense Refill  . cyclobenzaprine (FLEXERIL) 10 MG tablet Take 10 mg by mouth 3 (three) times daily as needed.    . hydrochlorothiazide (HYDRODIURIL) 25 MG tablet Take 1 tablet (25 mg total) by mouth daily. 30 tablet 0  . ketorolac (TORADOL) 10 MG tablet Take 1 tablet (10  mg total) by mouth every 6 (six) hours as needed. 20 tablet 0  . meloxicam (MOBIC) 15 MG tablet Take 1 tablet (15 mg total) by mouth daily. 10 tablet 0  . Vitamin D, Ergocalciferol, (DRISDOL) 1.25 MG (50000 UNIT) CAPS capsule Take 50,000 Units by mouth once a week. Monday    . benazepril (LOTENSIN) 40 MG tablet Take 40 mg by mouth daily.    Marland Kitchen tiZANidine (ZANAFLEX) 4 MG tablet Take 4 mg by mouth 3 (three) times daily as needed.     No current facility-administered medications for this visit.    REVIEW OF SYSTEMS:   Constitutional: Denies fevers, chills or abnormal night sweats   Breast: left breast skin thickening and significant lymphedema and palpable mass in the axilla All other systems were reviewed with the patient and are negative.  PHYSICAL EXAMINATION: ECOG PERFORMANCE STATUS: 1 - Symptomatic but completely ambulatory  Vitals:   10/01/20 1259  BP: (!) 168/63  Pulse: 93  Resp: 18  Temp: 98 F (36.7 C)  SpO2: 100%   Filed Weights   10/01/20 1259  Weight: 211 lb 6.4 oz (95.9 kg)    GENERAL:alert, no distress and comfortable  BREAST: Markedly swollen left breast with lymphedema, firmness and thickening of the breast, markedly enlarged left axillary lymph nodes (exam performed in the presence of a chaperone)   LABORATORY DATA:  I have reviewed the data as listed Lab Results  Component Value Date   WBC 7.9 10/01/2020   HGB 12.5 10/01/2020   HCT 38.4 10/01/2020   MCV 84.2 10/01/2020   PLT 255 10/01/2020   Lab Results  Component Value Date   NA 142 10/01/2020   K 3.2 (L) 10/01/2020   CL 98 10/01/2020   CO2 29 10/01/2020    RADIOGRAPHIC STUDIES: I have personally reviewed the radiological reports and agreed with the findings in the report.  ASSESSMENT AND PLAN:  Malignant neoplasm of upper-outer quadrant of left breast in female, estrogen receptor negative ([place]) Inflammatory breast cancer: Mammogram detected left breast mass UOQ skin thickening with 3 abnormal  lymph nodes that are matted and hard, ultrasound 2.7 cm, 2.9 cm, 3 cm and 3 lymph nodes biopsy revealed grade 2-3 IDC with DCIS ER/PR negative HER-2 IHC equivocal FISH pending, Ki-67 60%, lymph node biopsy positive Stage IIIc Pathology and radiology counseling: Discussed with the patient, the details of pathology including the type of breast cancer,the clinical staging, the significance of ER, PR and HER-2/neu receptors and the implications for treatment. After reviewing the pathology in detail, we proceeded to discuss the different treatment options between surgery, radiation, chemotherapy, antiestrogen therapies.  Recommendation based on multidisciplinary tumor board: 1. Neoadjuvant chemotherapy with Adriamycin and Cytoxan dose dense 4 followed by Taxol weekly 12 with carboplatin every 3 weeks x4 (pembrolizumab will be added if she is triple negative) 2. Followed by breast conserving surgery versus mastectomy and axillary lymph node dissection (due to matted axillary lymph nodes) 3. Followed by adjuvant radiation therapy  Chemotherapy Counseling: I  discussed the risks and benefits of chemotherapy including the risks of nausea/ vomiting, risk of infection from low WBC count, fatigue due to chemo or anemia, bruising or bleeding due to low platelets, mouth sores, loss/ change in taste and decreased appetite. Liver and kidney function will be monitored through out chemotherapy as abnormalities in liver and kidney function may be a side effect of treatment.  Peripheral neuropathy due to Taxol and cardiac dysfunction due to Adriamycin was discussed in detail. Risk of permanent bone marrow dysfunction due to chemo were also discussed.  Plan: 1. Port placement   2. Echocardiogram 3. Chemotherapy class 4. Breast MRI 5. CT chest abdomen pelvis and bone scan for staging 6. Genetic counseling will also be arranged  [place]: Treatment of refractory nausea.  After first cycle of chemo if patient  experience chemo induced nausea and vomiting the randomized from cycle 2 to Aloxi plus Dex plus olanzapine or placebo plus Compazine or placebo plus placebo prior to chemo and take home medications for day 2 today for of Dex plus olanzapine or placebo and Compazine or placebo every 8 hours.  If patient does not have nausea after cycle 1, then the trial is complete.  Return to clinic in 1-2 weeks to start chemotherapy.   All questions were answered. The patient knows to call the clinic with any problems, questions or concerns.   Rulon Eisenmenger, MD, MPH 10/01/2020    I, Molly Dorshimer, am acting as scribe for Nicholas Lose, MD.  I have reviewed the above documentation for accuracy and completeness, and I agree with the above.

## 2020-10-01 ENCOUNTER — Ambulatory Visit
Admission: RE | Admit: 2020-10-01 | Discharge: 2020-10-01 | Disposition: A | Discharge status: Home / Self Care | Payer: Federal, State, Local not specified - PPO | Source: Ambulatory Visit | Attending: Radiation Oncology | Admitting: Radiation Oncology

## 2020-10-01 ENCOUNTER — Inpatient Hospital Stay (HOSPITAL_BASED_OUTPATIENT_CLINIC_OR_DEPARTMENT_OTHER): Payer: Federal, State, Local not specified - PPO | Admitting: Genetic Counselor

## 2020-10-01 ENCOUNTER — Encounter: Payer: Self-pay | Admitting: Physical Therapy

## 2020-10-01 ENCOUNTER — Other Ambulatory Visit: Payer: Self-pay | Admitting: *Deleted

## 2020-10-01 ENCOUNTER — Inpatient Hospital Stay: Payer: Federal, State, Local not specified - PPO

## 2020-10-01 ENCOUNTER — Other Ambulatory Visit: Payer: Self-pay

## 2020-10-01 ENCOUNTER — Encounter: Payer: Self-pay | Admitting: Hematology and Oncology

## 2020-10-01 ENCOUNTER — Encounter: Payer: Self-pay | Admitting: Emergency Medicine

## 2020-10-01 ENCOUNTER — Inpatient Hospital Stay
Payer: Federal, State, Local not specified - PPO | Attending: Hematology and Oncology | Admitting: Hematology and Oncology

## 2020-10-01 ENCOUNTER — Other Ambulatory Visit: Payer: Self-pay | Admitting: Pharmacist

## 2020-10-01 ENCOUNTER — Ambulatory Visit: Payer: Federal, State, Local not specified - PPO | Attending: General Surgery | Admitting: Physical Therapy

## 2020-10-01 VITALS — BP 168/63 | HR 93 | Temp 98.0°F | Resp 18 | Ht 66.0 in | Wt 211.4 lb

## 2020-10-01 DIAGNOSIS — R293 Abnormal posture: Secondary | ICD-10-CM | POA: Insufficient documentation

## 2020-10-01 DIAGNOSIS — Z171 Estrogen receptor negative status [ER-]: Secondary | ICD-10-CM

## 2020-10-01 DIAGNOSIS — C50412 Malignant neoplasm of upper-outer quadrant of left female breast: Secondary | ICD-10-CM

## 2020-10-01 DIAGNOSIS — Z801 Family history of malignant neoplasm of trachea, bronchus and lung: Secondary | ICD-10-CM | POA: Diagnosis not present

## 2020-10-01 DIAGNOSIS — Z87891 Personal history of nicotine dependence: Secondary | ICD-10-CM

## 2020-10-01 DIAGNOSIS — R590 Localized enlarged lymph nodes: Secondary | ICD-10-CM | POA: Diagnosis not present

## 2020-10-01 DIAGNOSIS — Z8249 Family history of ischemic heart disease and other diseases of the circulatory system: Secondary | ICD-10-CM

## 2020-10-01 LAB — CMP (CANCER CENTER ONLY)
ALT: 16 U/L (ref 0–44)
AST: 21 U/L (ref 15–41)
Albumin: 3.6 g/dL (ref 3.5–5.0)
Alkaline Phosphatase: 76 U/L (ref 38–126)
Anion gap: 15 (ref 5–15)
BUN: 29 mg/dL — ABNORMAL HIGH (ref 6–20)
CO2: 29 mmol/L (ref 22–32)
Calcium: 9.4 mg/dL (ref 8.9–10.3)
Chloride: 98 mmol/L (ref 98–111)
Creatinine: 1.16 mg/dL — ABNORMAL HIGH (ref 0.44–1.00)
GFR, Estimated: 55 mL/min — ABNORMAL LOW (ref >60)
Glucose, Bld: 94 mg/dL (ref 70–99)
Potassium: 3.2 mmol/L — ABNORMAL LOW (ref 3.5–5.1)
Sodium: 142 mmol/L (ref 135–145)
Total Bilirubin: 0.4 mg/dL (ref 0.3–1.2)
Total Protein: 8.6 g/dL — ABNORMAL HIGH (ref 6.5–8.1)

## 2020-10-01 LAB — CBC WITH DIFFERENTIAL (CANCER CENTER ONLY)
Abs Immature Granulocytes: 0.03 10*3/uL (ref 0.00–0.07)
Basophils Absolute: 0 10*3/uL (ref 0.0–0.1)
Basophils Relative: 1 %
Eosinophils Absolute: 0.1 10*3/uL (ref 0.0–0.5)
Eosinophils Relative: 1 %
HCT: 38.4 % (ref 36.0–46.0)
Hemoglobin: 12.5 g/dL (ref 12.0–15.0)
Immature Granulocytes: 0 %
Lymphocytes Relative: 16 %
Lymphs Abs: 1.2 10*3/uL (ref 0.7–4.0)
MCH: 27.4 pg (ref 26.0–34.0)
MCHC: 32.6 g/dL (ref 30.0–36.0)
MCV: 84.2 fL (ref 80.0–100.0)
Monocytes Absolute: 0.9 10*3/uL (ref 0.1–1.0)
Monocytes Relative: 11 %
Neutro Abs: 5.6 10*3/uL (ref 1.7–7.7)
Neutrophils Relative %: 71 %
Platelet Count: 255 10*3/uL (ref 150–400)
RBC: 4.56 MIL/uL (ref 3.87–5.11)
RDW: 14.1 % (ref 11.5–15.5)
WBC Count: 7.9 10*3/uL (ref 4.0–10.5)
nRBC: 0 % (ref 0.0–0.2)

## 2020-10-01 LAB — GENETIC SCREENING ORDER

## 2020-10-01 MED ORDER — PROCHLORPERAZINE MALEATE 10 MG PO TABS: 10.0000 mg | ORAL_TABLET | Freq: Four times a day (QID) | ORAL | 1 refills | Status: DC | PRN

## 2020-10-01 MED ORDER — LIDOCAINE-PRILOCAINE 2.5-2.5 % EX CREA: TOPICAL_CREAM | CUTANEOUS | 3 refills | Status: DC

## 2020-10-01 MED ORDER — ONDANSETRON HCL 8 MG PO TABS: 8.0000 mg | ORAL_TABLET | Freq: Two times a day (BID) | ORAL | 1 refills | Status: DC | PRN

## 2020-10-01 NOTE — Progress Notes (Signed)
REFERRING PROVIDER: Serena Croissant, MD [address] [place],  [place] [address]  PRIMARY PROVIDER:  Shanna Cisco, NP  PRIMARY REASON FOR VISIT:  Encounter Diagnoses  Name Primary?  . Malignant neoplasm of upper-outer quadrant of left breast in female, estrogen receptor negative (HCC) Yes  . Family history of lung cancer     HISTORY OF PRESENT ILLNESS:   Rita Cole, a 60 y.o. female, was seen for a [facility] cancer genetics consultation during the breast multidisciplinary clinic at the request of Dr. Pamelia Hoit due to a personal history of triple negative breast cancer.  Rita Cole presents to clinic today with her sister to discuss the possibility of a hereditary predisposition to cancer, to discuss genetic testing, and to further clarify her future cancer risks, as well as potential cancer risks for family members.   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 The preliminary treatment plan includes neoadjuvant chemotherapy, surgery, and adjuvant radiation.   CANCER HISTORY:  Oncology History  Malignant neoplasm of upper-outer quadrant of left breast in female, estrogen receptor negative (HCC)  09/30/2020 Initial Diagnosis   Inflammatory breast cancer: Mammogram detected left breast mass UOQ skin thickening with 3 abnormal lymph nodes that are matted and hard, ultrasound 2.7 cm, 2.9 cm, 3 cm and 3 lymph nodes biopsy revealed grade 2-3 IDC with DCIS ER/PR negative HER-2 IHC equivocal FISH pending, Ki-67 60%, lymph node biopsy positive   10/01/2020 Cancer Staging   Staging form: Breast, AJCC 8th Edition - Clinical stage from 10/01/2020: Stage IIIC (cT4d, cN2a, cM0, G3, ER-, PR-, HER2: Equivocal) - Signed by Serena Croissant, MD on 10/01/2020 Histologic grading system: 3 grade system      RISK FACTORS:  Menarche was at age 63.  First live birth at age 9.  OCP use for  approximately 25 years.  Ovaries intact: yes.  Hysterectomy: no.  Menopausal status: postmenopausal.  HRT use: 0 years. Colonoscopy: yes; most recent March 2022. Mammogram within the last year: yes. Up to date with pelvic exams: yes; most recent PAP February 2022  Past Medical History:  Diagnosis Date  . Breast cancer (HCC)   . Hypertension     Past Surgical History:  Procedure Laterality Date  . BUNIONECTOMY    . MYOMECTOMY      Social History   Socioeconomic History  . Marital status: Divorced    Spouse name: Not on file  . Number of children: Not on file  . Years of education: Not on file  . Highest education level: Not on file  Occupational History  . Not on file  Tobacco Use  . Smoking status: Former Smoker    Packs/day: 0.25  . Smokeless tobacco: Never Used  Vaping Use  . Vaping Use: Never used  Substance and Sexual Activity  . Alcohol use: No  . Drug use: No  . Sexual activity: Yes  Other Topics Concern  . Not on file  Social History Narrative  . Not on file   Social Determinants of Health   Financial Resource Strain: Not on file  Food Insecurity: Not on file  Transportation Needs: Not on file  Physical Activity: Not on file  Stress: Not on file  Social Connections: Not on file     FAMILY HISTORY:  We obtained a detailed, 4-generation family history.  Significant diagnoses are listed below: Family History  Problem Relation Age of Onset  . Heart attack  Father   . Lung cancer Father        dx unknown age  . Cancer Other        MGM's mother; unknown type; dx unknown age    Rita Cole has one daughter, Rita Cole, age 47.  She has one sister, Rita Cole, age 16.  Rita Cole mother is living at age 40.  Rita Cole reported possible breast cancer in her mother, stating that her mother had a left mastectomy.  Rita Cole reported an unknown type of cancer in her maternal grandmother's mother.  Rita Cole father died at  age 59 and had a history of lung cancer.   Rita Cole is unaware of previous family history of genetic testing for hereditary cancer risks. There is no reported Ashkenazi Jewish ancestry. There is no known consanguinity.  GENETIC COUNSELING ASSESSMENT: Rita Cole is a 60 y.o. female with a personal history of cancer which is somewhat suggestive of a hereditary cancer syndrome and predisposition to cancer given her diagnosis of triple negative breast cancer. We, therefore, discussed and recommended the following at today's visit.   DISCUSSION: We discussed that 5 - 10% of cancer is hereditary, with most cases of hereditary breast cancer associated with mutations in BRCA1/2.  There are other genes that can be associated with hereditary breast cancer syndromes.  Type of cancer risk and level of risk are gene-specific.  We discussed that testing is beneficial for several reasons including knowing how to follow individuals for their cancer risks, identifying whether potential surgical or treatment options would be beneficial, and understanding if other family members could be at risk for cancer and allowing them to undergo genetic testing.   We reviewed the characteristics, features and inheritance patterns of hereditary cancer syndromes. We also discussed genetic testing, including the appropriate family members to test, the process of testing, insurance coverage and turn-around-time for results. We discussed the implications of a negative, positive, carrier and/or variant of uncertain significant result. We recommended Rita Cole pursue genetic testing for a panel that includes genes associated with breast cancer, such as CustomNext-Cancer +RNAinsight Panel through [facility].   The CustomNext-Cancer+RNAinsight panel offered by [facility] includes sequencing and rearrangement analysis for the following 47 genes:  APC, ATM, AXIN2, BARD1, BMPR1A, BRCA1, BRCA2, BRIP1, CDH1, CDK4, CDKN2A,  CHEK2, DICER1, EPCAM, GREM1, HOXB13, MEN1, MLH1, MSH2, MSH3, MSH6, MUTYH, NBN, NF1, NF2, NTHL1, PALB2, PMS2, POLD1, POLE, PTEN, RAD51C, RAD51D, RECQL, RET, SDHA, SDHAF2, SDHB, SDHC, SDHD, SMAD4, SMARCA4, STK11, TP53, TSC1, TSC2, and VHL.  RNA data is routinely analyzed for use in variant interpretation for all genes.  Based on Rita Cole's personal history of triple negative breast cancer cancer, she meets medical criteria for genetic testing. Despite that she meets criteria, she may still have an out of pocket cost.   PLAN: Rita Cole is interested in genetic testing, but wishes to receive more information on insurance coverage before proceeding.  Insurance pre-verification was submitted to [employer] and should be completed in approximately 1 week.  An estimated out of pocket cost will be communicated to Rita Cole by telephone when available.   Lastly, we encouraged Rita Cole to remain in contact with cancer genetics annually so that we can continuously update the family history and inform her of any changes in cancer genetics and testing that may be of benefit for this family.   Ms. Brisbane questions were answered to her satisfaction today. Our contact information was provided should additional questions or concerns arise. Thank you  for the referral and allowing [place] to share in the care of your patient.   Sareena Odeh M. Joette Catching, [place], [facility] Genetic Counselor [email or web address] (P) [phone]  The patient was seen for a total of 20 minutes in face-to-face genetic counseling.  Drs. Magrinat, Lindi Adie and/or Burr Medico were available to discuss this case as needed.    _______________________________________________________________________ For Office Staff:  Number of people involved in session: 1 Was an Intern/ student involved with case: no

## 2020-10-01 NOTE — Therapy (Signed)
[place], [place], [address] Phone: [phone]   Fax:  [phone]  Physical Therapy Evaluation  Patient Details  Name: Rita Cole MRN: [id number] Date of Birth: December 02, 1960 Referring Provider (PT): Dr. Rolm Bookbinder   Encounter Date: 10/01/2020   PT End of Session - 10/01/20 1612    Visit Number 1    Number of Visits 2    Date for PT Re-Evaluation 04/02/21    PT Start Time 1518    PT Stop Time 1600    PT Time Calculation (min) 42 min    Activity Tolerance Treatment limited secondary to agitation    Behavior During Therapy [facility] for tasks assessed/performed           Past Medical History:  Diagnosis Date  . Breast cancer ([place])   . Hypertension     Past Surgical History:  Procedure Laterality Date  . BUNIONECTOMY    . MYOMECTOMY      There were no vitals filed for this visit.    Subjective Assessment - 10/01/20 1601    Subjective Patient reports she is here today to be seen by her medical team for her newly diagnosed left breast cancer.    Patient is accompained by: Family member    Pertinent History Patient was diagnosed on 09/12/2020 with left grade II-III invasive ductal carcinoma breast cancer. There are 3 areas in the upper outer quadrant measuring 3 cm, 2.9 cm, and 2.7 cm. It is triple negative with a Ki67 of 60%. She has 3 abnormal appearing lymph nodes and 1 was biopsied and found to be positive.    Patient Stated Goals Reduce lymphedema risk and learn post op shoulder ROM HEP    Currently in Pain? Yes    Pain Score 5     Pain Location --   Proximal upper and lower extremities   Pain Orientation Proximal    Pain Descriptors / Indicators Aching    Pain Type Chronic pain    Pain Onset More than a month ago    Pain Frequency Constant    Aggravating Factors  Lifting arms and leg    Pain Relieving Factors unknown              [facility] PT Assessment - 10/01/20 0001       Assessment   Medical Diagnosis Left breast cancer    Referring Provider (PT) Dr. Rolm Bookbinder    Onset Date/Surgical Date 09/12/20    Hand Dominance Right    Prior Therapy none      Precautions   Precautions Other (comment)    Precaution Comments active cancer      Restrictions   Weight Bearing Restrictions No      Balance Screen   Has the patient fallen in the past 6 months No    Has the patient had a decrease in activity level because of a fear of falling?  No    Is the patient reluctant to leave their home because of a fear of falling?  No      Home Environment   Living Environment Private residence    Living Arrangements Alone    Available Help at Discharge Family      Prior Function   Level of [place] Retired    Leisure She does not exercise      Cognition   Overall Cognitive Status Within Functional Limits for tasks assessed  Posture/Postural Control   Posture/Postural Control Postural limitations    Postural Limitations Rounded Shoulders;Forward head      ROM / Strength   AROM / PROM / Strength AROM;Strength      AROM   Overall AROM Comments Bilateral cervical rotation limited 25%; others are WNL    AROM Assessment Site Shoulder    Right/Left Shoulder Right;Left    Right Shoulder Extension 37 Degrees    Right Shoulder Flexion 110 Degrees    Right Shoulder ABduction 120 Degrees    Left Shoulder Extension 36 Degrees    Left Shoulder Flexion 122 Degrees    Left Shoulder ABduction 127 Degrees      Strength   Overall Strength Comments Gross weakness present in proximal upper and lower extremities             LYMPHEDEMA/ONCOLOGY QUESTIONNAIRE - 10/01/20 0001      Type   Cancer Type Left breast cancer      Lymphedema Assessments   Lymphedema Assessments Upper extremities      Right Upper Extremity Lymphedema   15 cm Proximal to Olecranon Process 36.3 cm    10 cm Proximal to Olecranon Process 33.2 cm     Olecranon Process 26.3 cm    10 cm Proximal to Ulnar Styloid Process 23.2 cm    Just Proximal to Ulnar Styloid Process 15.5 cm    Across Hand at [facility] 18.6 cm    At [place] of 2nd Digit 6 cm      Left Upper Extremity Lymphedema   15 cm Proximal to Olecranon Process 38.8 cm    10 cm Proximal to Olecranon Process 36.4 cm    Olecranon Process 27.6 cm    10 cm Proximal to Ulnar Styloid Process 22.2 cm    Just Proximal to Ulnar Styloid Process 16.5 cm    Across Hand at [facility] 18.3 cm    At [place] of 2nd Digit 5.9 cm           L-DEX FLOWSHEETS - 10/01/20 1600      L-DEX LYMPHEDEMA SCREENING   Measurement Type Unilateral    L-DEX MEASUREMENT EXTREMITY Upper Extremity    POSITION  Standing    DOMINANT SIDE Right    At Risk Side Left    BASELINE SCORE (UNILATERAL) 16.1   Pt with visible and measurable edema present in left upper arm likely due to mets in lymph nodes          The patient was assessed using the L-Dex machine today to produce a lymphedema index baseline score. The patient will be reassessed on a regular basis (typically every 3 months) to obtain new L-Dex scores. If the score is > 6.5 points away from his/her baseline score indicating onset of subclinical lymphedema, it will be recommended to wear a compression garment for 4 weeks, 12 hours per day and then be reassessed. If the score continues to be > 6.5 points from baseline at reassessment, we will initiate lymphedema treatment. Assessing in this manner has a 95% rate of preventing clinically significant lymphedema.      Neldon Mc - 10/01/20 0001    Open a tight or new jar Moderate difficulty    Do heavy household chores (wash walls, wash floors) Moderate difficulty    Carry a shopping bag or briefcase Mild difficulty    Wash your back Moderate difficulty    Use a knife to cut food No difficulty    Recreational activities  in which you take some force or impact through your arm, shoulder, or hand  (golf, hammering, tennis) Severe difficulty    During the past week, to what extent has your arm, shoulder or hand problem interfered with your normal social activities with family, friends, neighbors, or groups? Quite a bit    During the past week, to what extent has your arm, shoulder or hand problem limited your work or other regular daily activities Modererately    Arm, shoulder, or hand pain. Moderate    Tingling (pins and needles) in your arm, shoulder, or hand Moderate    Difficulty Sleeping Severe difficulty    DASH Score 50 %            Objective measurements completed on examination: See above findings.      Patient was instructed today in a home exercise program today for post op shoulder range of motion. These included active assist shoulder flexion in sitting, scapular retraction, wall walking with shoulder abduction, and hands behind head external rotation.  She was encouraged to do these twice a day, holding 3 seconds and repeating 5 times when permitted by her physician.             PT Education - 10/01/20 1610    Education Details Lymphedema risk reduction and post op shoulder ROM HEP    Person(s) Educated Patient;Other (comment)   sister   Methods Demonstration;Handout    Comprehension Returned demonstration;Verbalized understanding               PT Long Term Goals - 10/01/20 1620      PT LONG TERM GOAL #1   Title Patient will demonstrate she has regained shoulder ROM and function post operatively compared to baselines.    Time 6    Period Months    Status New    Target Date 04/02/21           Breast Clinic Goals - 10/01/20 1620      Patient will be able to verbalize understanding of pertinent lymphedema risk reduction practices relevant to her diagnosis specifically related to skin care.   Time 1    Period Days    Status Achieved      Patient will be able to return demonstrate and/or verbalize understanding of the post-op home exercise  program related to regaining shoulder range of motion.   Time 1    Period Days    Status Achieved      Patient will be able to verbalize understanding of the importance of attending the postoperative After Breast Cancer Class for further lymphedema risk reduction education and therapeutic exercise.   Time 1    Period Days    Status Achieved                 Plan - 10/01/20 1613    Clinical Impression Statement Patient was diagnosed on 09/12/2020 with left grade II-III invasive ductal carcinoma breast cancer. There are 3 areas in the upper outer quadrant measuring 3 cm, 2.9 cm, and 2.7 cm. It is triple negative with a Ki67 of 60%. She has 3 abnormal appearing lymph nodes and 1 was biopsied and found to be positive. Her multidisciplinary medical team met prior to her assessments to determine a recommended treatment plan. She is planning to have neoadjuvant chemotherapy followed by a left modified radical mastectomy and radiation. She will benefit from PT following upcoming staging scans (dependent on findings) for generalized weakness and ROM deficits. She will  benefit from a post op PT reassessment to determine needs at that time and from L-Dex screens every 3 months for 2 years to detect subclinical lymphedema. She currently measures 16.1 on the SOZO scale indicating active lymphedema in the left arm.    Stability/Clinical Decision Making Stable/Uncomplicated    Clinical Decision Making Low    Rehab Potential Good    PT Frequency --   Eval and 1 f/u visit   PT Treatment/Interventions ADLs/Self Care Home Management;Therapeutic exercise;Patient/family education    PT Next Visit Plan Will reassess 3-4 weeks post op    PT Home Exercise Plan Post op shoulder ROM HEP    Consulted and Agree with Plan of Care Patient;Family member/caregiver    Family Member Consulted sister           Patient will benefit from skilled therapeutic intervention in order to improve the following deficits and  impairments:  Postural dysfunction,Decreased range of motion,Decreased knowledge of precautions,Impaired UE functional use,Pain  Visit Diagnosis: Malignant neoplasm of upper-outer quadrant of left breast in female, estrogen receptor negative ([place]) - Plan: PT plan of care cert/re-cert  Abnormal posture - Plan: PT plan of care cert/re-cert   Patient will follow up at outpatient cancer rehab 3-4 weeks following surgery.  If the patient requires physical therapy at that time, a specific plan will be dictated and sent to the referring physician for approval. The patient was educated today on appropriate basic range of motion exercises to begin post operatively and the importance of attending the After Breast Cancer class following surgery.  Patient was educated today on lymphedema risk reduction practices as it pertains to recommendations that will benefit the patient immediately following surgery.  She verbalized good understanding.      Problem List Patient Active Problem List   Diagnosis Date Noted  . Malignant neoplasm of upper-outer quadrant of left breast in female, estrogen receptor negative ([place]) 09/30/2020   Annia Friendly, PT 10/01/20 4:23 PM  [place], [place], [address] Phone: [phone]   Fax:  [phone]  Name: Rita Cole MRN: [id number] Date of Birth: [birth year]

## 2020-10-01 NOTE — Research (Signed)
[id number] - TREATMENT OF REFRACTORY NAUSEA  Study Introduction:  Patient Rita Cole was identified by Dr. Lindi Adie as a potential candidate for the above listed study.  This Clinical Research Coordinator met with Rita Cole, MRN [id number], on 10/01/20 in a private exam room to discuss participation in the above listed research study.  Patient is accompanied by her family member.  A copy of the informed consent document and separate HIPAA Authorization was provided to the patient.  Patient reads, speaks, and understands [place].    Patient was provided with the business card of this Coordinator and encouraged to contact the research team with any questions.  Approximately 20 minutes were spent with the patient reviewing the informed consent documents.  Patient was provided the option of taking informed consent documents home to review and was encouraged to review at their convenience with their support network, including other care providers. Patient took the consent documents home to review.   Plan:  Will call the patient on Friday of this week to check her interest in the study.  She was urged to call in the meantime with any questions.  She was thanked for her time and consideration of the study.  Clabe Seal Clinical Research Coordinator I  10/01/20 5:25 PM

## 2020-10-01 NOTE — Assessment & Plan Note (Signed)
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 After reviewing the pathology in detail, we proceeded to discuss the different treatment options between surgery, radiation, chemotherapy, antiestrogen therapies.  Recommendation based on multidisciplinary tumor board: 1. Neoadjuvant chemotherapy with Adriamycin and Cytoxan dose dense 4 followed by Taxol weekly 12 with carboplatin every 3 weeks x4 (pembrolizumab will be added if she is triple negative) 2. Followed by mastectomy and targeted node dissection 3. Followed by adjuvant radiation therapy  Chemotherapy Counseling: I discussed the risks and benefits of chemotherapy including the risks of nausea/ vomiting, risk of infection from low WBC count, fatigue due to chemo or anemia, bruising or bleeding due to low platelets, mouth sores, loss/ change in taste and decreased appetite. Liver and kidney function will be monitored through out chemotherapy as abnormalities in liver and kidney function may be a side effect of treatment.  Peripheral neuropathy due to Taxol and cardiac dysfunction due to Adriamycin was discussed in detail. Risk of permanent bone marrow dysfunction due to chemo were also discussed.  Plan: 1. Port placement   2. Echocardiogram 3. Chemotherapy class 4. Breast MRI 5. CT chest abdomen pelvis and bone scan for staging 6. Genetic counseling will also be arranged  [place]: Treatment of refractory nausea.  After first cycle of  chemo if patient experience chemo induced nausea and vomiting the randomized from cycle 2 to Aloxi plus Dex plus olanzapine or placebo plus Compazine or placebo plus placebo prior to chemo and take home medications for day 2 today for of Dex plus olanzapine or placebo and Compazine or placebo every 8 hours.  If patient does not have nausea after cycle 1, then the trial is complete.  Return to clinic in 1-2 weeks to start chemotherapy.

## 2020-10-01 NOTE — Progress Notes (Signed)
START ON PATHWAY REGIMEN - Breast     Cycles 1 through 4: A cycle is every 21 days:     Pembrolizumab      Paclitaxel      Carboplatin      Filgrastim-xxxx    Cycles 5 through 8: A cycle is every 21 days:     Pembrolizumab      Doxorubicin      Cyclophosphamide      Pegfilgrastim-xxxx   **Always confirm dose/schedule in your pharmacy ordering system**  Patient Characteristics: Preoperative or Nonsurgical Candidate (Clinical Staging), Neoadjuvant Therapy followed by Surgery, Invasive Disease, Chemotherapy, HER2 Negative/Unknown/Equivocal, ER Negative/Unknown, Platinum Therapy Indicated, High-Risk Disease Present Therapeutic Status: Preoperative or Nonsurgical Candidate (Clinical Staging) AJCC M Category: cM0 AJCC Grade: G3 Breast Surgical Plan: Neoadjuvant Therapy followed by Surgery ER Status: Negative (-) AJCC 8 Stage Grouping: IIIC HER2 Status: Negative (-) AJCC T Category: cT4b AJCC N Category: cN2a PR Status: Negative (-) Type of Therapy: Platinum Therapy Indicated Intent of Therapy: Curative Intent, Not Discussed with Patient

## 2020-10-01 NOTE — Patient Instructions (Signed)

## 2020-10-01 NOTE — Progress Notes (Signed)
Radiation Oncology         [phone] ________________________________  Name: Rita Cole        MRN: [id number]  Date of Service: 10/01/2020 DOB: 01-Apr-1961  HQ:[id number], Nena Alexander, NP  Rolm Bookbinder, MD     REFERRING PHYSICIAN: Rolm Bookbinder, MD   DIAGNOSIS: The encounter diagnosis was Malignant neoplasm of upper-outer quadrant of left breast in female, estrogen receptor negative ([place]).   HISTORY OF PRESENT ILLNESS: Rita Cole is a 60 y.o. female seen in the multidisciplinary breast clinic for a new diagnosis of left breast cancer.  The patient was found to have distortion as well as adenopathy within the left breast, she had further diagnostic imaging that showed a 5 cm asymmetry as well as 3 abnormal appearing lymph nodes.  At the time of her ultrasound the radiologist noted hard red nodding skin as well as firm fixed lymph nodes in the left axilla.  She had 3 separate masses measuring 2.7 cm, 2.9 cm, and 3 cm each.  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  Her tumor was ER/PR negative, HER-2 is equivocal and pending FISH this morning, her Ki-67 was 60%.  She is seen today to discuss treatment recommendations of her cancer.   PREVIOUS RADIATION THERAPY: No   PAST MEDICAL HISTORY: No past medical history on file.     PAST SURGICAL HISTORY:No past surgical history on file.   FAMILY HISTORY:  Family History  Problem Relation Age of Onset  . Heart attack Father      SOCIAL HISTORY:  reports that she has never smoked. She has never used smokeless tobacco. She reports that she does not drink alcohol and does not use drugs.  The patient is divorced and lives in [place].  She works for the [employer].   ALLERGIES: Patient has no known allergies.   MEDICATIONS:   Current Outpatient Medications  Medication Sig Dispense Refill  . cyclobenzaprine (FLEXERIL) 10 MG tablet Take 10 mg by mouth 3 (three) times daily as needed.    . hydrochlorothiazide (HYDRODIURIL) 25 MG tablet Take 1 tablet (25 mg total) by mouth daily. 30 tablet 0  . ketorolac (TORADOL) 10 MG tablet Take 1 tablet (10 mg total) by mouth every 6 (six) hours as needed. 20 tablet 0  . meloxicam (MOBIC) 15 MG tablet Take 1 tablet (15 mg total) by mouth daily. 10 tablet 0  . predniSONE (DELTASONE) 5 MG tablet Take 3 tablets (15 mg total) by mouth daily. 2 tabs po daily x 4 days 30 tablet 0  . tiZANidine (ZANAFLEX) 4 MG tablet Take 4 mg by mouth 3 (three) times daily as needed.    . Vitamin D, Ergocalciferol, (DRISDOL) 1.25 MG (50000 UNIT) CAPS capsule Take 50,000 Units by mouth once a week. Monday     No current facility-administered medications for this encounter.     REVIEW OF SYSTEMS: On review of systems, the patient reports that she is doing well. She dos have some hip and low back pain at times. She denies any concerns with her breast at this time. No other complaints are noted.     PHYSICAL EXAM:  Wt Readings from Last 3 Encounters:  09/06/20 225 lb (102.1 kg)   Temp Readings from Last 3 Encounters:  09/07/20 98.4 F (36.9 C) (Oral)  05/29/17 97.9 F (36.6 C) (Oral)  03/18/17 98.4 F (36.9 C) (Oral)   BP Readings from Last 3 Encounters:  09/07/20 (!) 171/100  05/29/17 (!) 155/80  03/18/17 (!) 157/86   Pulse Readings from Last 3 Encounters:  09/07/20 93  05/29/17 78  03/18/17 83    In general this is a well appearing African-American female in no acute distress. She's alert and oriented x4 and appropriate throughout the examination. Cardiopulmonary assessment is negative for acute distress and she exhibits normal effort. Bilateral breast exam is deferred.    ECOG = 1  0 - Asymptomatic (Fully active, able to carry on all predisease activities without restriction)  1  - Symptomatic but completely ambulatory (Restricted in physically strenuous activity but ambulatory and able to carry out work of a light or sedentary nature. For example, light housework, office work)  2 - Symptomatic, <50% in bed during the day (Ambulatory and capable of all self care but unable to carry out any work activities. Up and about more than 50% of waking hours)  3 - Symptomatic, >50% in bed, but not bedbound (Capable of only limited self-care, confined to bed or chair 50% or more of waking hours)  4 - Bedbound (Completely disabled. Cannot carry on any self-care. Totally confined to bed or chair)  5 - Death   Eustace Pen MM, Creech RH, Tormey DC, et al. [phone]). "Toxicity and response criteria of the [facility]". [place] Oncol. 5 (6): 649-55    LABORATORY DATA:  Lab Results  Component Value Date   WBC 9.3 09/06/2020   HGB 13.5 09/06/2020   HCT 42.4 09/06/2020   MCV 88.7 09/06/2020   PLT 259 09/06/2020   Lab Results  Component Value Date   NA 137 09/06/2020   K 4.8 09/06/2020   CL 99 09/06/2020   CO2 26 09/06/2020   No results found for: ALT, AST, GGT, ALKPHOS, BILITOT    RADIOGRAPHY: DG Cervical Spine Complete  Result Date: 09/06/2020 CLINICAL DATA:  Neck pain EXAM: CERVICAL SPINE - COMPLETE 4+ VIEW COMPARISON:  None. FINDINGS: Degenerative disc disease is greatest at C4-6 with anterior osteophytes. No visible fracture. The dens is intact. Lateral masses of C1 and C2 are aligned. IMPRESSION: Degenerative disc disease, greatest at C4-6. Electronically Signed   By: Ulyses Jarred M.D.   On: 09/06/2020 20:53   DG Wrist Complete Left  Result Date: 09/06/2020 CLINICAL DATA:  Left wrist swelling. EXAM: LEFT WRIST - COMPLETE 3+ VIEW COMPARISON:  None. FINDINGS: There is no evidence of fracture or dislocation. Degenerative changes are seen within the lateral aspect of the left wrist and at the carpometacarpal articulation of the left thumb. Mild diffuse  soft tissue swelling is noted. IMPRESSION: Degenerative changes without evidence of an acute osseous abnormality. Electronically Signed   By: Virgina Norfolk M.D.   On: 09/06/2020 20:57   DG Hip Unilat With Pelvis 2-3 Views Left  Result Date: 09/06/2020 CLINICAL DATA:  Left-sided groin pain EXAM: DG HIP (WITH OR WITHOUT PELVIS) 2-3V LEFT COMPARISON:  None. FINDINGS: There is no evidence of hip fracture or dislocation. There is no evidence of arthropathy or other focal bone abnormality. IMPRESSION: Negative. Electronically Signed   By: Ulyses Jarred M.D.   On: 09/06/2020 20:55       IMPRESSION/PLAN: 1. 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 Dr. Lisbeth Renshaw discusses the pathology findings and reviews the nature of node positive breast  disease. The consensus from the breast conference includes proceeding with neoadjuvant chemotherapy.  Dr. Donne Hazel anticipates modified radical mastectomy given her nodal involvement, and presentation with what appears to be inflammatory disease.  Dr. Lisbeth Renshaw discusses the external radiotherapy to the chest wall and regional lymph nodes to reduce risks of local recurrence followed by antiestrogen therapy. We discussed the risks, benefits, short, and long term effects of radiotherapy, as well as the curative intent, and the patient is interested in proceeding. Dr. Lisbeth Renshaw discusses the delivery and logistics of radiotherapy and anticipates a course of 6 1/2 weeks of radiotherapy to the left chest wall and regional nodes.. We will see her back a few weeks after surgery to discuss the simulation process and anticipate we starting radiotherapy about 4-6 weeks after surgery.    In a visit lasting 60  minutes, greater than 50% of the time was spent face to face reviewing her case, as well as in preparation of, discussing, and coordinating the patient's care.  The above documentation reflects my direct findings during this  shared patient visit. Please see the separate note by Dr. Lisbeth Renshaw on this date for the remainder of the patient's plan of care.    Carola Rhine, [facility]    **Disclaimer: This note was dictated with voice recognition software. Similar sounding words can inadvertently be transcribed and this note may contain transcription errors which may not have been corrected upon publication of note.**

## 2020-10-02 ENCOUNTER — Encounter: Payer: Self-pay | Admitting: *Deleted

## 2020-10-02 ENCOUNTER — Telehealth: Payer: Self-pay | Admitting: *Deleted

## 2020-10-02 ENCOUNTER — Telehealth: Payer: Self-pay | Admitting: Genetic Counselor

## 2020-10-02 ENCOUNTER — Other Ambulatory Visit: Payer: Self-pay | Admitting: *Deleted

## 2020-10-02 ENCOUNTER — Encounter: Payer: Self-pay | Admitting: Genetic Counselor

## 2020-10-02 DIAGNOSIS — Z171 Estrogen receptor negative status [ER-]: Secondary | ICD-10-CM

## 2020-10-02 DIAGNOSIS — C50412 Malignant neoplasm of upper-outer quadrant of left female breast: Secondary | ICD-10-CM

## 2020-10-02 DIAGNOSIS — Z801 Family history of malignant neoplasm of trachea, bronchus and lung: Secondary | ICD-10-CM

## 2020-10-02 HISTORY — DX: Family history of malignant neoplasm of trachea, bronchus and lung: Z80.1

## 2020-10-02 NOTE — Progress Notes (Signed)
[facility] Psychosocial Distress Screening Counseling Intern  Counseling intern was referred by distress screening protocol.  The patient scored a 5 on the Psychosocial Distress Thermometer which indicates moderate distress. Counseling intern met with patient in exam room" to assess for distress and other psychosocial needs. The intern met with patient and her sister. They both liked the doctors and the patient reported feeling a "lot better." The sister asked a lot of questions about support and the patient shared a lot of her own feelings and thoughts. The patient reported being "full of faith" and requested to connect with the chaplain.   ONCBCN DISTRESS SCREENING 10/02/2020  Screening Type Initial Screening  Distress experienced in past week (1-10) 5  Family Problem type Children;Other (comment)  Emotional problem type Nervousness/Anxiety;Adjusting to illness  Physical Problem type Pain;Sleep/insomnia;Getting around;Loss of appetitie;Tingling hands/feet  Referral to support programs Yes   Follow up needed: Yes.    If yes, follow up plan: Intern will forward chart to [facility], MDiv per request of patient.  [name] Counseling Intern

## 2020-10-02 NOTE — Telephone Encounter (Signed)
Spoke to pt concerning Rita Cole from 10/01/20. Denies questions or concerns regarding dx or treatment care plan. Discussed in detail future appts with, dates, times and directions. No further needs voiced at this time. Encourage pt to call with needs. Received verbal understanding. Contact information provided.

## 2020-10-02 NOTE — Telephone Encounter (Signed)
Called patient to answer questions about insurance coverage and genetic testing.  Discussed plan of submitting pre-verification to obtain estimated OOP for genetic testing.  Estimate should be available in approximately 1 week, and I will call her with results.   Patient stated that she would like sample submitted to lab and placed on hold until pre-verification process is complete.

## 2020-10-03 ENCOUNTER — Telehealth: Payer: Self-pay | Admitting: Emergency Medicine

## 2020-10-03 NOTE — Telephone Encounter (Signed)
[id number] - TREATMENT OF REFRACTORY NAUSEA  10/03/20  9:45 AM Outgoing call:  Called patient to follow up concerning interest in this research study.  No answer, left voicemail requesting return call.

## 2020-10-06 ENCOUNTER — Ambulatory Visit
Admission: RE | Admit: 2020-10-06 | Discharge: 2020-10-06 | Disposition: A | Discharge status: Home / Self Care | Payer: Federal, State, Local not specified - PPO | Source: Ambulatory Visit | Attending: Hematology and Oncology | Admitting: Hematology and Oncology

## 2020-10-06 ENCOUNTER — Other Ambulatory Visit: Payer: Self-pay

## 2020-10-06 ENCOUNTER — Telehealth: Payer: Self-pay | Admitting: Emergency Medicine

## 2020-10-06 DIAGNOSIS — C50412 Malignant neoplasm of upper-outer quadrant of left female breast: Secondary | ICD-10-CM

## 2020-10-06 MED ORDER — GADOBUTROL 1 MMOL/ML IV SOLN
9.0000 mL | Freq: Once | INTRAVENOUS | Status: AC | PRN
  Administered 2020-10-06: 9 mL via INTRAVENOUS

## 2020-10-06 NOTE — Telephone Encounter (Signed)
DCP-001: Use of a Clinical Trial Screening Tool to Address Cancer Health Disparities in the [place] ([facility])  Introduced the DCP-001 research study to the patient.  Will review the consent document in person tomorrow prior to her chemo education appointment.  Patient was thanked for her time.  Clabe Seal Clinical Research Coordinator I  10/06/20  4:08 PM

## 2020-10-06 NOTE — Telephone Encounter (Signed)
[id number] - TREATMENT OF REFRACTORY NAUSEA  Called patient to discuss this research study.  Patient states that after consideration she has decided to decline participation.  Patient declined due to concerns of additional time required for the study with her busy schedule.  The patient was thanked for considering participating and urged to call if she has any further questions or changes her mind.  Clabe Seal Clinical Research Coordinator I  10/06/20 3:51 PM

## 2020-10-07 ENCOUNTER — Encounter: Payer: Self-pay | Admitting: Emergency Medicine

## 2020-10-07 ENCOUNTER — Encounter: Payer: Self-pay | Admitting: *Deleted

## 2020-10-07 ENCOUNTER — Ambulatory Visit (HOSPITAL_COMMUNITY)
Admission: RE | Admit: 2020-10-07 | Discharge: 2020-10-07 | Disposition: A | Discharge status: Home / Self Care | Payer: Federal, State, Local not specified - PPO | Source: Ambulatory Visit | Attending: Hematology and Oncology | Admitting: Hematology and Oncology

## 2020-10-07 ENCOUNTER — Telehealth: Payer: Self-pay | Admitting: Emergency Medicine

## 2020-10-07 ENCOUNTER — Telehealth: Payer: Self-pay | Admitting: Genetic Counselor

## 2020-10-07 ENCOUNTER — Encounter (HOSPITAL_COMMUNITY): Payer: Self-pay

## 2020-10-07 ENCOUNTER — Telehealth: Payer: Self-pay | Admitting: Hematology and Oncology

## 2020-10-07 ENCOUNTER — Inpatient Hospital Stay: Payer: Federal, State, Local not specified - PPO

## 2020-10-07 DIAGNOSIS — C50412 Malignant neoplasm of upper-outer quadrant of left female breast: Secondary | ICD-10-CM

## 2020-10-07 DIAGNOSIS — Z171 Estrogen receptor negative status [ER-]: Secondary | ICD-10-CM | POA: Diagnosis present

## 2020-10-07 MED ORDER — IOHEXOL 300 MG/ML  SOLN
100.0000 mL | Freq: Once | INTRAMUSCULAR | Status: AC | PRN
  Administered 2020-10-07: 100 mL via INTRAVENOUS

## 2020-10-07 NOTE — Telephone Encounter (Signed)
Scheduled appts per 4/7 sch msg. Called pt, no answer. Left msg with appts dates and times. Will have updated calendar printed for pt.

## 2020-10-07 NOTE — Telephone Encounter (Signed)
DCP-001: Use of a Clinical Trial Screening Tool to Address Cancer Health Disparities in the [facility])  Patient called after she finished her CT scan to inform that she is still considering participation in this study and is taking the forms home to review. The study procedures for this data collection study were discussed.  She was informed again that this study is voluntary.  She states that if she plans to participate, she will let me know prior to her appointments on Monday.  She was thanked for her time and urged to call with any questions.  Clabe Seal Clinical Research Coordinator I  10/07/20  3:48 PM

## 2020-10-07 NOTE — Research (Signed)
DCP-001: Use of a Clinical Trial Screening Tool to Address Cancer Health Disparities in the [place] [facility] ([facility])  10/07/20  Patient Rita Cole was identified by this Research officer, political party as a potential candidate for the above listed study.  This Clinical Research Coordinator met with JADELIN ENG, MRN [id number], on 10/07/20 in a manner and location that ensures patient privacy to discuss participation in the above listed research study.  Patient is Accompanied by her daughter Caryl Pina.  A copy of the informed consent document and separate HIPAA Authorization was provided to the patient.  Patient reads, speaks, and understands [place].   Patient was provided with the business card of this Coordinator and encouraged to contact the research team with any questions.  Approximately 10 minutes were spent with the patient reviewing the informed consent documents.  Patient was provided the option of taking informed consent documents home to review and was encouraged to review at their convenience with their support network, including other care providers.  The patient was given the informed consent documents to review.  Patient stated she will call me if she wants to participate in this study.  Clabe Seal Clinical Research Coordinator I  10/07/20 12:02 PM

## 2020-10-07 NOTE — Telephone Encounter (Signed)
Received update from Jesse Fall, RN that [name] received billing update from [employer] and wishes to proceed with genetic testing at this time.  LVM with [name] confirming receipt of this message and notifying her that testing is in progress.  Contact information provided.

## 2020-10-08 ENCOUNTER — Encounter: Payer: Self-pay | Admitting: *Deleted

## 2020-10-08 ENCOUNTER — Encounter: Payer: Self-pay | Admitting: General Practice

## 2020-10-08 NOTE — Progress Notes (Signed)
[facility] Spiritual Care Note  Reached Ellyson by phone per referral from Counseling Intern [name] for spiritual/emotional support. Anaiza's faith is a central part of her meaning-making and coping. She is working hard to process her diagnosis and the whirlwind of preparing for treatment. We plan to follow up in person at her first chemo treatment on 4/25.   [place], [place], [facility] Pager [phone] Voicemail [phone]

## 2020-10-09 ENCOUNTER — Other Ambulatory Visit: Payer: Self-pay | Admitting: *Deleted

## 2020-10-09 ENCOUNTER — Telehealth: Payer: Self-pay | Admitting: *Deleted

## 2020-10-09 DIAGNOSIS — C50412 Malignant neoplasm of upper-outer quadrant of left female breast: Secondary | ICD-10-CM

## 2020-10-09 DIAGNOSIS — R928 Other abnormal and inconclusive findings on diagnostic imaging of breast: Secondary | ICD-10-CM

## 2020-10-09 NOTE — Telephone Encounter (Signed)
Left vm regarding CT results and request return call to discuss recommendations.

## 2020-10-10 ENCOUNTER — Telehealth: Payer: Self-pay | Admitting: *Deleted

## 2020-10-10 NOTE — Telephone Encounter (Signed)
Spoke to pt concerning CT scan results and need for PET scan. Received verbal understanding.

## 2020-10-13 ENCOUNTER — Encounter: Payer: Self-pay | Admitting: Genetic Counselor

## 2020-10-13 ENCOUNTER — Telehealth: Payer: Self-pay | Admitting: *Deleted

## 2020-10-13 ENCOUNTER — Other Ambulatory Visit: Payer: Self-pay

## 2020-10-13 ENCOUNTER — Encounter (HOSPITAL_COMMUNITY)
Admission: RE | Admit: 2020-10-13 | Discharge: 2020-10-13 | Disposition: A | Discharge status: Home / Self Care | Payer: Federal, State, Local not specified - PPO | Source: Ambulatory Visit | Attending: Hematology and Oncology | Admitting: Hematology and Oncology

## 2020-10-13 ENCOUNTER — Other Ambulatory Visit: Payer: Self-pay | Admitting: Physician Assistant

## 2020-10-13 ENCOUNTER — Other Ambulatory Visit: Payer: Self-pay | Admitting: *Deleted

## 2020-10-13 ENCOUNTER — Telehealth: Payer: Self-pay | Admitting: Genetic Counselor

## 2020-10-13 DIAGNOSIS — Z171 Estrogen receptor negative status [ER-]: Secondary | ICD-10-CM | POA: Insufficient documentation

## 2020-10-13 DIAGNOSIS — Z1379 Encounter for other screening for genetic and chromosomal anomalies: Secondary | ICD-10-CM | POA: Insufficient documentation

## 2020-10-13 DIAGNOSIS — C50412 Malignant neoplasm of upper-outer quadrant of left female breast: Secondary | ICD-10-CM | POA: Diagnosis present

## 2020-10-13 MED ORDER — TECHNETIUM TC 99M MEDRONATE IV KIT
20.0000 | PACK | Freq: Once | INTRAVENOUS | Status: AC | PRN
  Administered 2020-10-13: 21.5 via INTRAVENOUS

## 2020-10-13 NOTE — Telephone Encounter (Signed)
Revealed negative genetic testing.  Discussed that we do not know why she has breast cancer or why there is cancer in the family. It could be sporadic, due to a different gene that we are not testing, or maybe our current technology may not be able to pick something up.  It will be important for her to keep in contact with genetics to keep up with whether additional testing may be needed.

## 2020-10-13 NOTE — Progress Notes (Signed)
Pharmacist Chemotherapy Monitoring - Initial Assessment    Anticipated start date: 10/20/20   Regimen:  . Are orders appropriate based on the patient's diagnosis, regimen, and cycle? Yes . Does the plan date match the patient's scheduled date? Yes . Is the sequencing of drugs appropriate? Yes . Are the premedications appropriate for the patient's regimen? Yes . Prior Authorization for treatment is: Approved o If applicable, is the correct biosimilar selected based on the patient's insurance? not applicable  Organ Function and Labs: Marland Kitchen Are dose adjustments needed based on the patient's renal function, hepatic function, or hematologic function? Yes . Are appropriate labs ordered prior to the start of patient's treatment? Yes . Other organ system assessment, if indicated: anthracyclines: Echo/ MUGA . The following baseline labs, if indicated, have been ordered: pembrolizumab: baseline TSH +/- T4  Dose Assessment: . Are the drug doses appropriate? Yes . Are the following correct: o Drug concentrations Yes o IV fluid compatible with drug Yes o Administration routes Yes o Timing of therapy Yes . If applicable, does the patient have documented access for treatment and/or plans for port-a-cath placement? yes . If applicable, have lifetime cumulative doses been properly documented and assessed? yes Lifetime Dose Tracking  No doses have been documented on this patient for the following tracked chemicals: Doxorubicin, Epirubicin, Idarubicin, Daunorubicin, Mitoxantrone, Bleomycin, Oxaliplatin, Carboplatin, Liposomal Doxorubicin  o   Toxicity Monitoring/Prevention: . The patient has the following take home antiemetics prescribed: Ondansetron and Prochlorperazine . The patient has the following take home medications prescribed: N/A . Medication allergies and previous infusion related reactions, if applicable, have been reviewed and addressed. No . The patient's current medication list has been  assessed for drug-drug interactions with their chemotherapy regimen. no significant drug-drug interactions were identified on review.  Order Review: . Are the treatment plan orders signed? Yes . Is the patient scheduled to see a provider prior to their treatment? No  I verify that I have reviewed each item in the above checklist and answered each question accordingly.  Larene Beach, [place], 10/13/2020  3:55 PM

## 2020-10-13 NOTE — Telephone Encounter (Signed)
Pt called this am with some questions.  She is at [facility] for bone Scan.  She wanted to do tour of treatment area.  Informed that I would come get her.  Toured treatment area & questions answered.

## 2020-10-13 NOTE — Telephone Encounter (Signed)
Spoke to pt regarding future appts. Confirmed appts, date and time as well as location. Informed right axiliary bx is negative.

## 2020-10-14 ENCOUNTER — Other Ambulatory Visit (HOSPITAL_COMMUNITY): Payer: Federal, State, Local not specified - PPO

## 2020-10-14 ENCOUNTER — Ambulatory Visit (HOSPITAL_COMMUNITY)
Admission: RE | Admit: 2020-10-14 | Discharge: 2020-10-14 | Disposition: A | Discharge status: Home / Self Care | Payer: Federal, State, Local not specified - PPO | Source: Ambulatory Visit | Attending: Hematology and Oncology | Admitting: Hematology and Oncology

## 2020-10-14 ENCOUNTER — Encounter: Payer: Self-pay | Admitting: *Deleted

## 2020-10-14 ENCOUNTER — Encounter (HOSPITAL_COMMUNITY): Payer: Self-pay

## 2020-10-14 ENCOUNTER — Ambulatory Visit (HOSPITAL_COMMUNITY): Payer: Federal, State, Local not specified - PPO

## 2020-10-14 ENCOUNTER — Other Ambulatory Visit: Payer: Self-pay | Admitting: Hematology and Oncology

## 2020-10-14 ENCOUNTER — Encounter: Payer: Self-pay | Admitting: Hematology and Oncology

## 2020-10-14 ENCOUNTER — Ambulatory Visit: Payer: Self-pay | Admitting: Genetic Counselor

## 2020-10-14 ENCOUNTER — Encounter: Payer: Self-pay | Admitting: Emergency Medicine

## 2020-10-14 DIAGNOSIS — Z791 Long term (current) use of non-steroidal anti-inflammatories (NSAID): Secondary | ICD-10-CM | POA: Diagnosis not present

## 2020-10-14 DIAGNOSIS — I10 Essential (primary) hypertension: Secondary | ICD-10-CM | POA: Diagnosis not present

## 2020-10-14 DIAGNOSIS — C50412 Malignant neoplasm of upper-outer quadrant of left female breast: Secondary | ICD-10-CM

## 2020-10-14 DIAGNOSIS — Z79899 Other long term (current) drug therapy: Secondary | ICD-10-CM | POA: Diagnosis not present

## 2020-10-14 DIAGNOSIS — C50912 Malignant neoplasm of unspecified site of left female breast: Secondary | ICD-10-CM | POA: Diagnosis present

## 2020-10-14 DIAGNOSIS — Z171 Estrogen receptor negative status [ER-]: Secondary | ICD-10-CM

## 2020-10-14 DIAGNOSIS — Z801 Family history of malignant neoplasm of trachea, bronchus and lung: Secondary | ICD-10-CM

## 2020-10-14 DIAGNOSIS — Z87891 Personal history of nicotine dependence: Secondary | ICD-10-CM | POA: Insufficient documentation

## 2020-10-14 DIAGNOSIS — R7303 Prediabetes: Secondary | ICD-10-CM | POA: Insufficient documentation

## 2020-10-14 DIAGNOSIS — Z1379 Encounter for other screening for genetic and chromosomal anomalies: Secondary | ICD-10-CM

## 2020-10-14 HISTORY — PX: IR IMAGING GUIDED PORT INSERTION: IMG5740

## 2020-10-14 HISTORY — DX: Prediabetes: R73.03

## 2020-10-14 MED ORDER — FENTANYL CITRATE (PF) 100 MCG/2ML IJ SOLN
INTRAMUSCULAR | Status: AC
  Filled 2020-10-14: qty 2

## 2020-10-14 MED ORDER — HEPARIN SOD (PORK) LOCK FLUSH 100 UNIT/ML IV SOLN
INTRAVENOUS | Status: AC
  Filled 2020-10-14: qty 5

## 2020-10-14 MED ORDER — MIDAZOLAM HCL 2 MG/2ML IJ SOLN
INTRAMUSCULAR | Status: AC
  Filled 2020-10-14: qty 4

## 2020-10-14 MED ORDER — SODIUM CHLORIDE 0.9 % IV SOLN: INTRAVENOUS | Status: DC

## 2020-10-14 MED ORDER — FENTANYL CITRATE (PF) 100 MCG/2ML IJ SOLN
INTRAMUSCULAR | Status: AC | PRN
  Administered 2020-10-14 (×2): 50 ug via INTRAVENOUS

## 2020-10-14 MED ORDER — LIDOCAINE-EPINEPHRINE 1 %-1:100000 IJ SOLN
INTRAMUSCULAR | Status: AC
  Filled 2020-10-14: qty 1

## 2020-10-14 MED ORDER — LIDOCAINE-EPINEPHRINE 1 %-1:100000 IJ SOLN
INTRAMUSCULAR | Status: AC | PRN
  Administered 2020-10-14 (×3): 10 mL via INTRADERMAL

## 2020-10-14 MED ORDER — HEPARIN SOD (PORK) LOCK FLUSH 100 UNIT/ML IV SOLN
INTRAVENOUS | Status: AC | PRN
  Administered 2020-10-14: 500 [IU] via INTRAVENOUS

## 2020-10-14 MED ORDER — MIDAZOLAM HCL 2 MG/2ML IJ SOLN
INTRAMUSCULAR | Status: AC | PRN
  Administered 2020-10-14 (×4): 1 mg via INTRAVENOUS

## 2020-10-14 NOTE — Discharge Instructions (Signed)
Implanted Port Home Guide An implanted port is a device that is placed under the skin. It is usually placed in the chest. The device can be used to give IV medicine, to take blood, or for dialysis. You may have an implanted port if:  You need IV medicine that would be irritating to the small veins in your hands or arms.  You need IV medicines, such as antibiotics, for a long period of time.  You need IV nutrition for a long period of time.  You need dialysis. When you have a port, your health care provider can choose to use the port instead of veins in your arms for these procedures. You may have fewer limitations when using a port than you would if you used other types of long-term IVs, and you will likely be able to return to normal activities after your incision heals. An implanted port has two main parts:  Reservoir. The reservoir is the part where a needle is inserted to give medicines or draw blood. The reservoir is round. After it is placed, it appears as a small, raised area under your skin.  Catheter. The catheter is a thin, flexible tube that connects the reservoir to a vein. Medicine that is inserted into the reservoir goes into the catheter and then into the vein. How is my port accessed? To access your port:  A numbing cream may be placed on the skin over the port site.  Your health care provider will put on a mask and sterile gloves.  The skin over your port will be cleaned carefully with a germ-killing soap and allowed to dry.  Your health care provider will gently pinch the port and insert a needle into it.  Your health care provider will check for a blood return to make sure the port is in the vein and is not clogged.  If your port needs to remain accessed to get medicine continuously (constant infusion), your health care provider will place a clear bandage (dressing) over the needle site. The dressing and needle will need to be changed every week, or as told by your  health care provider. What is flushing? Flushing helps keep the port from getting clogged. Follow instructions from your health care provider about how and when to flush the port. Ports are usually flushed with saline solution or a medicine called heparin. The need for flushing will depend on how the port is used:  If the port is only used from time to time to give medicines or draw blood, the port may need to be flushed: ? Before and after medicines have been given. ? Before and after blood has been drawn. ? As part of routine maintenance. Flushing may be recommended every 4-6 weeks.  If a constant infusion is running, the port may not need to be flushed.  Throw away any syringes in a disposal container that is meant for sharp items (sharps container). You can buy a sharps container from a pharmacy, or you can make one by using an empty hard plastic bottle with a cover. How long will my port stay implanted? The port can stay in for as long as your health care provider thinks it is needed. When it is time for the port to come out, a surgery will be done to remove it. The surgery will be similar to the procedure that was done to put the port in. Follow these instructions at home:  Flush your port as told by your health care   provider.  If you need an infusion over several days, follow instructions from your health care provider about how to take care of your port site. Make sure you: ? Wash your hands with soap and water before you change your dressing. If soap and water are not available, use alcohol-based hand sanitizer. ? Change your dressing as told by your health care provider. ? Place any used dressings or infusion bags into a plastic bag. Throw that bag in the trash. ? Keep the dressing that covers the needle clean and dry. Do not get it wet. ? Do not use scissors or sharp objects near the tube. ? Keep the tube clamped, unless it is being used.  Check your port site every day for signs  of infection. Check for: ? Redness, swelling, or pain. ? Fluid or blood. ? Pus or a bad smell.  Protect the skin around the port site. ? Avoid wearing bra straps that rub or irritate the site. ? Protect the skin around your port from seat belts. Place a soft pad over your chest if needed.  Bathe or shower as told by your health care provider. The site may get wet as long as you are not actively receiving an infusion.  Return to your normal activities as told by your health care provider. Ask your health care provider what activities are safe for you.  Carry a medical alert card or wear a medical alert bracelet at all times. This will let health care providers know that you have an implanted port in case of an emergency.   Get help right away if:  You have redness, swelling, or pain at the port site.  You have fluid or blood coming from your port site.  You have pus or a bad smell coming from the port site.  You have a fever. Summary  Implanted ports are usually placed in the chest for long-term IV access.  Follow instructions from your health care provider about flushing the port and changing bandages (dressings).  Take care of the area around your port by avoiding clothing that puts pressure on the area, and by watching for signs of infection.  Protect the skin around your port from seat belts. Place a soft pad over your chest if needed.  Get help right away if you have a fever or you have redness, swelling, pain, drainage, or a bad smell at the port site. This information is not intended to replace advice given to you by your health care provider. Make sure you discuss any questions you have with your health care provider. Document Revised: 10/29/2019 Document Reviewed: 10/29/2019 Elsevier Patient Education  2021 Elsevier Inc. Moderate Conscious Sedation, Adult, Care After This sheet gives you information about how to care for yourself after your procedure. Your health care  provider may also give you more specific instructions. If you have problems or questions, contact your health care provider. What can I expect after the procedure? After the procedure, it is common to have:  Sleepiness for several hours.  Impaired judgment for several hours.  Difficulty with balance.  Vomiting if you eat too soon. Follow these instructions at home: For the time period you were told by your health care provider:  Rest.  Do not participate in activities where you could fall or become injured.  Do not drive or use machinery.  Do not drink alcohol.  Do not take sleeping pills or medicines that cause drowsiness.  Do not make important decisions or sign   legal documents.  Do not take care of children on your own.      Eating and drinking  Follow the diet recommended by your health care provider.  Drink enough fluid to keep your urine pale yellow.  If you vomit: ? Drink water, juice, or soup when you can drink without vomiting. ? Make sure you have little or no nausea before eating solid foods.   General instructions  Take over-the-counter and prescription medicines only as told by your health care provider.  Have a responsible adult stay with you for the time you are told. It is important to have someone help care for you until you are awake and alert.  Do not smoke.  Keep all follow-up visits as told by your health care provider. This is important. Contact a health care provider if:  You are still sleepy or having trouble with balance after 24 hours.  You feel light-headed.  You keep feeling nauseous or you keep vomiting.  You develop a rash.  You have a fever.  You have redness or swelling around the IV site. Get help right away if:  You have trouble breathing.  You have new-onset confusion at home. Summary  After the procedure, it is common to feel sleepy, have impaired judgment, or feel nauseous if you eat too soon.  Rest after you  get home. Know the things you should not do after the procedure.  Follow the diet recommended by your health care provider and drink enough fluid to keep your urine pale yellow.  Get help right away if you have trouble breathing or new-onset confusion at home. This information is not intended to replace advice given to you by your health care provider. Make sure you discuss any questions you have with your health care provider. Document Revised: 10/12/2019 Document Reviewed: 05/10/2019 Elsevier Patient Education  2021 Elsevier Inc.  

## 2020-10-14 NOTE — Progress Notes (Signed)
Called pt to introduce myself as her Arboriculturist and to discuss copay assistance.  Pt would like to apply and gave me consent to apply in her behalf so I completed theonlineapplicationwith the[employer] for [employer]. The app is pending so I will notify herof the outcome once I receive it.  I also completed the [employer] application, will get hers and Dr. Geralyn Flash signature and fax to [employer] for processing.  I will notify the pt of the outcome once I receive it.  She would also like to apply for the [place] so she will bring her proof of income on 10/20/20.  If approved I will give her an expense sheet and my card for any questions or concernsshe may have in the future.

## 2020-10-14 NOTE — Progress Notes (Signed)
HPI:  Ms. Cutbirth was previously seen in the [facility] clinic due to a personal history of cancer and concerns regarding a hereditary predisposition to cancer. Please refer to our prior cancer genetics clinic note for more information regarding our discussion, assessment and recommendations, at the time. Ms. Bischoff recent genetic test results were disclosed to her, as were recommendations warranted by these results. These results and recommendations are discussed in more detail below.  CANCER HISTORY:  In April 2022, at the age of 60, 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 The preliminary treatment plan includes neoadjuvant chemotherapy, surgery, and adjuvant radiation.  Oncology History  Malignant neoplasm of upper-outer quadrant of left breast in female, estrogen receptor negative ([place])  09/30/2020 Initial Diagnosis   Swollen left breast: Mammogram detected left breast mass UOQ skin thickening with 3 abnormal lymph nodes that are matted and hard, ultrasound 2.7 cm, 2.9 cm, 3 cm and 3 lymph nodes biopsy revealed grade 2-3 IDC with DCIS ER/PR negative HER-2 IHC equivocal FISH pending, Ki-67 60%, lymph node biopsy positive   10/01/2020 Cancer Staging   Staging form: Breast, AJCC 8th Edition - Clinical stage from 10/01/2020: Stage IIIC (cT4b, cN2a, cM0, G3, ER-, PR-, HER2: Equivocal) - Signed by Nicholas Lose, MD on 10/01/2020 Stage prefix: Initial diagnosis Histologic grading system: 3 grade system   10/13/2020 Genetic Testing   Negative hereditary cancer genetic testing: no pathogenic variants detected in [facility] CustomNext-Cancer +RNAinsight Panel.  The report date is October 13, 2020.   The CustomNext-Cancer+RNAinsight panel offered by [facility] includes sequencing and rearrangement analysis for the following 47 genes:  APC, ATM, AXIN2, BARD1, BMPR1A, BRCA1, BRCA2, BRIP1, CDH1, CDK4, CDKN2A, CHEK2, DICER1, EPCAM,  GREM1, HOXB13, MEN1, MLH1, MSH2, MSH3, MSH6, MUTYH, NBN, NF1, NF2, NTHL1, PALB2, PMS2, POLD1, POLE, PTEN, RAD51C, RAD51D, RECQL, RET, SDHA, SDHAF2, SDHB, SDHC, SDHD, SMAD4, SMARCA4, STK11, TP53, TSC1, TSC2, and VHL.  RNA data is routinely analyzed for use in variant interpretation for all genes.   01/12/2021 -  Chemotherapy    Patient is on Treatment Plan: BREAST PEMBROLIZUMAB + AC Q21D X 4 CYCLES / PEMBROLIZUMAB + CARBOPLATIN D1 + PACLITAXEL D1,8,15 Q21D X 4 CYCLES        FAMILY HISTORY:  We obtained a detailed, 4-generation family history.  Significant diagnoses are listed below: Family History  Problem Relation Age of Onset  . Lung cancer Father        dx unknown age  . Cancer Other        MGM's mother; unknown type; dx unknown age    [name] has one daughter, Caryl Pina, age 58.  She has one sister, Tressia Miners, age 26.  Ms. Huge mother is living at age 50.  Ms. Branca reported possible breast cancer in her mother, stating that her mother had a left mastectomy.  Ms. Runnion reported an unknown type of cancer in her maternal grandmother's mother.  Ms. Limburg father died at age 15 and had a history of lung cancer.   Ms. Muldrew is unaware of previous family history of genetic testing for hereditary cancer risks. There is no reported Ashkenazi Jewish ancestry. There is no known consanguinity.  GENETIC TEST RESULTS: Genetic testing reported out on October 13, 2020.  The CustomNext-Cancer +RNAinsight Panel found no pathogenic mutations. The CustomNext-Cancer+RNAinsight panel offered by [facility] includes sequencing and rearrangement analysis for the following 47 genes:  APC, ATM, AXIN2, BARD1, BMPR1A, BRCA1, BRCA2, BRIP1, CDH1, CDK4, CDKN2A, CHEK2,  DICER1, EPCAM, GREM1, HOXB13, MEN1, MLH1, MSH2, MSH3, MSH6, MUTYH, NBN, NF1, NF2, NTHL1, PALB2, PMS2, POLD1, POLE, PTEN, RAD51C, RAD51D, RECQL, RET, SDHA, SDHAF2, SDHB, SDHC, SDHD, SMAD4, SMARCA4, STK11,  TP53, TSC1, TSC2, and VHL.  RNA data is routinely analyzed for use in variant interpretation for all genes.  The test report has been scanned into EPIC and is located under the Molecular Pathology section of the Results Review tab.  A portion of the result report is included below for reference.     We discussed with [name] that because current genetic testing is not perfect, it is possible there may be a gene mutation in one of these genes that current testing cannot detect, but that chance is small.  We also discussed, that there could be another gene that has not yet been discovered, or that we have not yet tested, that is responsible for the cancer diagnoses in the family. It is also possible there is a hereditary cause for the cancer in the family that [name] did not inherit and therefore was not identified in her testing.  Therefore, it is important to remain in touch with cancer genetics in the future so that we can continue to offer [name] the most up to date genetic testing.   ADDITIONAL GENETIC TESTING: There are other genes that are associated with increased cancer risk that can be analyzed. Should [name] wish to pursue additional genetic testing, we are happy to discuss and coordinate this testing, at any time.    CANCER SCREENING RECOMMENDATIONS: Ms. Pingley test result is considered negative (normal).  This means that we have not identified a hereditary cause for her personal history of cancer at this time. Most cancers happen by chance and this negative test suggests that her cancer may fall into this category.    While reassuring, this does not definitively rule out a hereditary predisposition to cancer. It is still possible that there could be genetic mutations that are undetectable by current technology. There could be genetic mutations in genes that have not been tested or identified to increase cancer risk.  Therefore, it is  recommended she continue to follow the cancer management and screening guidelines provided by her oncology and primary healthcare provider.   An individual's cancer risk and medical management are not determined by genetic test results alone. Overall cancer risk assessment incorporates additional factors, including personal medical history, family history, and any available genetic information that may result in a personalized plan for cancer prevention and surveillance  RECOMMENDATIONS FOR FAMILY MEMBERS:  Individuals in this family might be at some increased risk of developing cancer, over the general population risk, simply due to the family history of cancer.  We recommended women in this family have a yearly mammogram beginning at age 50, or 52 years younger than the earliest onset of cancer, an annual clinical breast exam, and perform monthly breast self-exams. Women in this family should also have a gynecological exam as recommended by their primary provider. Family members should be referred for colonoscopy starting at age 34.  FOLLOW-UP: Lastly, we discussed with [name] that cancer genetics is a rapidly advancing field and it is possible that new genetic tests will be appropriate for her and/or her family members in the future. We encouraged her to remain in contact with cancer genetics on an annual basis so we can update her personal and family histories and let her know of advances in cancer genetics that may benefit this family.  Our contact number was provided. Ms. Rey questions were answered to her satisfaction, and she knows she is welcome to call us at anytime with additional questions or concerns.      M. Joette Catching, [place], [facility] Genetic Counselor [email or web address] (P) [phone]

## 2020-10-14 NOTE — Procedures (Signed)
Interventional Radiology Procedure Note  Procedure: RT IJ POWER PORT    Complications: None  Estimated Blood Loss:  MIN  Findings: TIP SVCRA    M. TREVOR Charlee Squibb, MD    

## 2020-10-14 NOTE — Consult Note (Signed)
Chief Complaint: Patient was seen in consultation today for Port-A-Cath placement  Referring Physician(s): Gudena,Vinay  Supervising Physician: Daryll Brod  Patient Status: [facility] - Out-pt  History of Present Illness: Rita Cole is a 60 y.o. female with history of prediabetes, hypertension, and newly diagnosed left breast carcinoma who presents today for Port-A-Cath placement for chemotherapy.  Past Medical History:  Diagnosis Date  . Breast cancer ([place])   . Family history of lung cancer 10/02/2020  . Hypertension   . Pre-diabetes     Past Surgical History:  Procedure Laterality Date  . BUNIONECTOMY    . MYOMECTOMY      Allergies: Patient has no known allergies.  Medications: Prior to Admission medications   Medication Sig Start Date End Date Taking? Authorizing Provider  benazepril (LOTENSIN) 40 MG tablet Take 40 mg by mouth daily. 09/29/20  Yes [provider]  hydrochlorothiazide (HYDRODIURIL) 25 MG tablet Take 1 tablet (25 mg total) by mouth daily. 3/71/69  Yes Delora Fuel, MD  Vitamin D, Ergocalciferol, (DRISDOL) 1.25 MG (50000 UNIT) CAPS capsule Take 50,000 Units by mouth once a week. Monday 08/19/20  Yes [provider]  cyclobenzaprine (FLEXERIL) 10 MG tablet Take 10 mg by mouth 3 (three) times daily as needed. 05/26/20   [provider]  ketorolac (TORADOL) 10 MG tablet Take 1 tablet (10 mg total) by mouth every 6 (six) hours as needed. 09/06/20   Arnaldo Natal, MD  lidocaine-prilocaine (EMLA) cream Apply to affected area once 10/01/20   Nicholas Lose, MD  meloxicam (MOBIC) 15 MG tablet Take 1 tablet (15 mg total) by mouth daily. 6/78/93   Delora Fuel, MD  ondansetron (ZOFRAN) 8 MG tablet Take 1 tablet (8 mg total) by mouth 2 (two) times daily as needed. Start on the third day after carboplatin and AC chemotherapy. 10/01/20   Nicholas Lose, MD  prochlorperazine (COMPAZINE) 10 MG tablet Take 1 tablet (10 mg total) by mouth every 6  (six) hours as needed (Nausea or vomiting). 10/01/20   Nicholas Lose, MD  tiZANidine (ZANAFLEX) 4 MG tablet Take 4 mg by mouth 3 (three) times daily as needed. 07/30/20   [provider]  cetirizine (ZYRTEC ALLERGY) 10 MG tablet Take 1 tablet (10 mg total) by mouth daily. Patient not taking: Reported on 09/06/2020 05/29/17 09/06/20  Jaynee Eagles, PA-C     Family History  Problem Relation Age of Onset  . Heart attack Father   . Lung cancer Father        dx unknown age  . Cancer Other        MGM's mother; unknown type; dx unknown age    Social History   Socioeconomic History  . Marital status: Divorced    Spouse name: Not on file  . Number of children: Not on file  . Years of education: Not on file  . Highest education level: Not on file  Occupational History  . Not on file  Tobacco Use  . Smoking status: Former Smoker    Packs/day: 0.25  . Smokeless tobacco: Never Used  Vaping Use  . Vaping Use: Never used  Substance and Sexual Activity  . Alcohol use: No  . Drug use: No  . Sexual activity: Yes  Other Topics Concern  . Not on file  Social History Narrative  . Not on file   Social Determinants of Health   Financial Resource Strain: Not on file  Food Insecurity: Not on file  Transportation Needs: Not on file  Physical Activity: Not on file  Stress: Not on file  Social Connections: Not on file      Review of Systems denies fever, headache, chest pain, dyspnea, cough, abdominal pain, nausea, vomiting or bleeding.  Does have some right upper back/shoulder discomfort  Vital Signs: BP 98/77   Pulse 82   Temp 98.4 F (36.9 C) (Oral)   Resp 16   SpO2 97%   Physical Exam awake, alert.  Chest clear to auscultation bilaterally.  Heart with regular rate and rhythm.  Abdomen soft, positive bowel sounds, nontender.  No lower extremity edema.  Imaging: CT CHEST W CONTRAST  Result Date: 10/09/2020 CLINICAL DATA:  New diagnosis of breast cancer. Staging examination.  EXAM: CT CHEST, ABDOMEN, AND PELVIS WITH CONTRAST TECHNIQUE: Multidetector CT imaging of the chest, abdomen and pelvis was performed following the standard protocol during bolus administration of intravenous contrast. CONTRAST:  165mL OMNIPAQUE IOHEXOL 300 MG/ML  SOLN COMPARISON:  Breast MRI 10/06/2020 FINDINGS: CT CHEST FINDINGS Cardiovascular: The heart is normal in size. No pericardial effusion. The aorta is normal in caliber. No dissection. Minimal atherosclerotic calcifications. The branch vessels are patent. No definite coronary artery calcifications. Mediastinum/Nodes: Anterior mediastinal mass appears to be contiguous with internal mammary adenopathy and is likely adenopathy also. It measures approximately 3.1 x 1.3 cm on image 24/2. The left internal mammary lymph node measures 10 mm on image 21/2 no other enlarged mediastinal or hilar lymph nodes are identified. The esophagus is grossly normal. Lungs/Pleura: No worrisome pulmonary nodules to suggest pulmonary metastatic disease. No acute pulmonary findings. No pleural effusions or pleural nodules. Musculoskeletal: There is marked skin thickening of the left breast along with ill-defined interstitial changes and irregular enhancing 3 cm left breast mass which contains a biopsy clip. Findings consistent with known breast cancer. There is associated extensive left axillary metastatic adenopathy with the largest node measuring 6.0 x 4.0 cm. There is also subpectoral adenopathy with the largest node measuring short axis 2.1 cm. Subclavicular lymph node on image number 10/2 measures 11 mm. No lytic or sclerotic bone lesions are identified to suggest osseous metastatic disease. No right-sided breast mass is identified. There are few borderline right axillary lymph nodes. CT ABDOMEN PELVIS FINDINGS Hepatobiliary: 2 small low-attenuation liver lesions are indeterminate. 8.5 mm lesion in segment 4A on image 50/2 and 8 mm lesion in the caudate lobe on image 49/2. No  intrahepatic biliary dilatation. The gallbladder is normal. No common bile duct dilatation. Pancreas: No mass, inflammation or ductal dilatation. Small low-attenuation area in the body/tail junction region is likely a fatty cleft. Spleen: Normal size.  No focal lesions. Adrenals/Urinary Tract: 10 mm left adrenal gland nodule is indeterminate. The right adrenal gland is normal. Indeterminate low-attenuation lesion in the midpole region of the left kidney posteriorly. The right kidney is unremarkable. The bladder is unremarkable. Stomach/Bowel: The stomach, duodenum, small bowel and colon are unremarkable. The terminal ileum and appendix are normal. Vascular/Lymphatic: Scattered aortic calcifications but no aneurysm or dissection. The branch vessels are patent. The major venous structures are patent. No mesenteric or retroperitoneal mass or adenopathy. Small scattered lymph nodes are noted. Reproductive: Small scattered calcified uterine fibroids are noted. Both ovaries are normal. Other: No pelvic mass or adenopathy. No free pelvic fluid collections. No inguinal mass or adenopathy. No abdominal wall hernia or subcutaneous lesions. Musculoskeletal: Advanced degenerative changes at the pubic symphysis suggesting osteitis pubis. No worrisome bone lesions to suggest metastatic disease. IMPRESSION: 1. CT findings consistent with known left breast  cancer with left breast mass and extensive left axillary, subpectoral and left internal mammary adenopathy. 2. Anterior mediastinal mass appears to be contiguous with internal mammary adenopathy. 3. No findings for pulmonary metastatic disease. 4. Small indeterminate liver, left adrenal and left renal lesions. PET-CT may be helpful for further evaluation if clinically necessary. 5. No findings suspicious for osseous metastatic disease. Aortic Atherosclerosis (ICD10-I70.0). Electronically Signed   By: Marijo Sanes M.D.   On: 10/09/2020 08:33   NM Bone Scan Whole Body  Result  Date: 10/13/2020 CLINICAL DATA:  LEFT breast cancer, staging, chronic RIGHT shoulder pain EXAM: NUCLEAR MEDICINE WHOLE BODY BONE SCAN TECHNIQUE: Whole body anterior and posterior images were obtained approximately 3 hours after intravenous injection of radiopharmaceutical. RADIOPHARMACEUTICALS:  21.5 mCi Technetium-17m MDP IV COMPARISON:  None Correlation: CT chest abdomen pelvis 10/07/2020 FINDINGS: Uptake at shoulders, sternoclavicular joints, hands/wrists, hips, knees, ankles/feet, typically degenerative. Uptake bilaterally at lower lumbar spine at approximately a L4-L5 corresponding to facet degenerative changes on CT. No worrisome sites of abnormal tracer accumulation are identified to suggest osseous metastatic disease. Uptake in LEFT breast consistent with surgery/therapy. IMPRESSION: Scattered degenerative type uptake as above. No scintigraphic evidence of osseous metastatic disease. Electronically Signed   By: Lavonia Dana M.D.   On: 10/13/2020 19:53   CT ABDOMEN PELVIS W CONTRAST  Result Date: 10/09/2020 CLINICAL DATA:  New diagnosis of breast cancer. Staging examination. EXAM: CT CHEST, ABDOMEN, AND PELVIS WITH CONTRAST TECHNIQUE: Multidetector CT imaging of the chest, abdomen and pelvis was performed following the standard protocol during bolus administration of intravenous contrast. CONTRAST:  196mL OMNIPAQUE IOHEXOL 300 MG/ML  SOLN COMPARISON:  Breast MRI 10/06/2020 FINDINGS: CT CHEST FINDINGS Cardiovascular: The heart is normal in size. No pericardial effusion. The aorta is normal in caliber. No dissection. Minimal atherosclerotic calcifications. The branch vessels are patent. No definite coronary artery calcifications. Mediastinum/Nodes: Anterior mediastinal mass appears to be contiguous with internal mammary adenopathy and is likely adenopathy also. It measures approximately 3.1 x 1.3 cm on image 24/2. The left internal mammary lymph node measures 10 mm on image 21/2 no other enlarged mediastinal  or hilar lymph nodes are identified. The esophagus is grossly normal. Lungs/Pleura: No worrisome pulmonary nodules to suggest pulmonary metastatic disease. No acute pulmonary findings. No pleural effusions or pleural nodules. Musculoskeletal: There is marked skin thickening of the left breast along with ill-defined interstitial changes and irregular enhancing 3 cm left breast mass which contains a biopsy clip. Findings consistent with known breast cancer. There is associated extensive left axillary metastatic adenopathy with the largest node measuring 6.0 x 4.0 cm. There is also subpectoral adenopathy with the largest node measuring short axis 2.1 cm. Subclavicular lymph node on image number 10/2 measures 11 mm. No lytic or sclerotic bone lesions are identified to suggest osseous metastatic disease. No right-sided breast mass is identified. There are few borderline right axillary lymph nodes. CT ABDOMEN PELVIS FINDINGS Hepatobiliary: 2 small low-attenuation liver lesions are indeterminate. 8.5 mm lesion in segment 4A on image 50/2 and 8 mm lesion in the caudate lobe on image 49/2. No intrahepatic biliary dilatation. The gallbladder is normal. No common bile duct dilatation. Pancreas: No mass, inflammation or ductal dilatation. Small low-attenuation area in the body/tail junction region is likely a fatty cleft. Spleen: Normal size.  No focal lesions. Adrenals/Urinary Tract: 10 mm left adrenal gland nodule is indeterminate. The right adrenal gland is normal. Indeterminate low-attenuation lesion in the midpole region of the left kidney posteriorly. The right kidney  is unremarkable. The bladder is unremarkable. Stomach/Bowel: The stomach, duodenum, small bowel and colon are unremarkable. The terminal ileum and appendix are normal. Vascular/Lymphatic: Scattered aortic calcifications but no aneurysm or dissection. The branch vessels are patent. The major venous structures are patent. No mesenteric or retroperitoneal mass  or adenopathy. Small scattered lymph nodes are noted. Reproductive: Small scattered calcified uterine fibroids are noted. Both ovaries are normal. Other: No pelvic mass or adenopathy. No free pelvic fluid collections. No inguinal mass or adenopathy. No abdominal wall hernia or subcutaneous lesions. Musculoskeletal: Advanced degenerative changes at the pubic symphysis suggesting osteitis pubis. No worrisome bone lesions to suggest metastatic disease. IMPRESSION: 1. CT findings consistent with known left breast cancer with left breast mass and extensive left axillary, subpectoral and left internal mammary adenopathy. 2. Anterior mediastinal mass appears to be contiguous with internal mammary adenopathy. 3. No findings for pulmonary metastatic disease. 4. Small indeterminate liver, left adrenal and left renal lesions. PET-CT may be helpful for further evaluation if clinically necessary. 5. No findings suspicious for osseous metastatic disease. Aortic Atherosclerosis (ICD10-I70.0). Electronically Signed   By: Marijo Sanes M.D.   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 The three-dimensional MR images were interpreted, and findings are reported in the following complete MRI report for this study. Three dimensional images were evaluated at the independent interpreting workstation using the DynaCAD thin client. COMPARISON:  Previous exam(s). FINDINGS: Breast  composition: b. Scattered fibroglandular tissue. Background parenchymal enhancement: Mild Right breast: No mass or abnormal enhancement. Left breast: A 5.1 x 3.6 x 4.4 cm irregular enhancing mass within the UPPER-OUTER LEFT breast, middle depth, is identified with biopsy clip artifact along the OUTER aspect of this mass and compatible with biopsy-proven malignancy. Scattered non masslike enhancement throughout all 4 quadrants of the LEFT breast as well as diffuse LEFT breast skin thickening and enhancement, all highly suspicious for malignancy. Lymph nodes: At least 8 abnormal LEFT axillary lymph nodes are identified, the largest with 3.2 cm cortical thickening and containing biopsy clip artifact compatible with known metastasis. Abnormal level 1 and level 2 LEFT axillary lymph nodes are noted. It is difficult to visualize the level 3 region on this study. A single 0.5 cm LEFT internal mammary lymph node (series 12: Image 49) is noted. There are 2 RIGHT axillary lymph nodes with possible cortical thickening. 2nd-look ultrasound is recommended. Ancillary findings:  None. IMPRESSION: 1. 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 2. At least 8 abnormal LEFT axillary lymph nodes compatible with metastatic disease, the largest biopsy proven to be a metastasis. 3. Indeterminate 0.5 cm LEFT internal mammary lymph node. 4. Two RIGHT axillary lymph nodes with possible cortical thickening. Ultrasound evaluation is recommended. 5. No MR evidence of RIGHT breast malignancy. RECOMMENDATION: RIGHT axillary ultrasound and possible biopsy. Otherwise treatment plan for known LEFT breast cancer and lymph node  metastasis. BI-RADS CATEGORY  4: Suspicious. Electronically Signed   By: Margarette Canada M.D.   On: 10/06/2020  13:17    Labs:  CBC: Recent Labs    09/06/20 2055 10/01/20 1204  WBC 9.3 7.9  HGB 13.5 12.5  HCT 42.4 38.4  PLT 259 255    COAGS: No results for input(s): INR, APTT in the last 8760 hours.  BMP: Recent Labs    09/06/20 2055 10/01/20 1204  NA 137 142  K 4.8 3.2*  CL 99 98  CO2 26 29  GLUCOSE 90 94  BUN 20 29*  CALCIUM 9.6 9.4  CREATININE 1.01* 1.16*  GFRNONAA >60 55*    LIVER FUNCTION TESTS: Recent Labs    10/01/20 1204  BILITOT 0.4  AST 21  ALT 16  ALKPHOS 76  PROT 8.6*  ALBUMIN 3.6    TUMOR MARKERS: No results for input(s): AFPTM, CEA, CA199, CHROMGRNA in the last 8760 hours.  Assessment and Plan: 60 y.o. female with history of prediabetes, hypertension, and newly diagnosed left breast carcinoma who presents today for Port-A-Cath placement for chemotherapy.Risks and benefits of image guided port-a-catheter placement was discussed with the patient including, but not limited to bleeding, infection, pneumothorax, or fibrin sheath development and need for additional procedures.  All of the patient's questions were answered, patient is agreeable to proceed. Consent signed and in chart.      Thank you for this interesting consult.  I greatly enjoyed meeting W[place] and look forward to participating in their care.  A copy of this report was sent to the requesting provider on this date.  Electronically Signed: D. Rowe Robert, PA-C 10/14/2020, 12:18 PM   I spent a total of 25 minutes    in face to face in clinical consultation, greater than 50% of which was counseling/coordinating care for Port-A-Cath placement

## 2020-10-15 ENCOUNTER — Encounter: Payer: Self-pay | Admitting: Hematology and Oncology

## 2020-10-15 ENCOUNTER — Encounter: Payer: Self-pay | Admitting: Dietician

## 2020-10-15 NOTE — Progress Notes (Signed)
DCP-001: Use of a Clinical Trial Screening Tool to Address Cancer Health Disparities in the [facility] ([facility])  10/14/20: Patient's sister and daughter spoke to this research coordinator in the hallway while patient was here for port placement.  The patient's family members informed this coordinator that the patient was not interested in participating in this study at this time.  Clabe Seal Clinical Research Coordinator I  10/15/20 3:56 PM

## 2020-10-15 NOTE — Progress Notes (Signed)
PtwasapprovedforFulphila from4/19/22to 4/18/23for up to $10,000.The program reduces pt's copay responsibility to $0.

## 2020-10-15 NOTE — Progress Notes (Signed)
Nutrition   Patient identified on MST for weight loss after attending Breast Clinic on 4/6. Patient given nutrition packet with RD contact information by nurse navigator at that time.  Chart reviewed. Patient with newly diagnosed left breast cancer. Pending neoadjuvant chemotherapy on 4/25.   Spoke with patient via phone. Introduced Sports coach and service at [employer]. She reports intentional weight loss after prediabetes diagnosis in March. Patient reports dietary changes, eating more fruits, vegetables, chicken, salmon, whole grains, drinking lots of water and some Hibiscus tea. She occasionally drinks cranberry juice.   Patient is not currently at nutrition risk. Patient appreciative of call and will contact if nutrition issues arise.   Ht: 5'6" Wt: 211 lb 6.4 lb (4/6) BMI: 34.12  Lajuan Lines, RD, [place] [facility] [phone]

## 2020-10-16 ENCOUNTER — Telehealth: Payer: Self-pay | Admitting: *Deleted

## 2020-10-16 NOTE — Telephone Encounter (Signed)
Spoke to pt confirmed appts on 4/25. Discussed CT/bone scan results.

## 2020-10-17 ENCOUNTER — Other Ambulatory Visit: Payer: Federal, State, Local not specified - PPO

## 2020-10-17 ENCOUNTER — Ambulatory Visit (HOSPITAL_COMMUNITY)
Admission: RE | Admit: 2020-10-17 | Discharge: 2020-10-17 | Disposition: A | Discharge status: Home / Self Care | Payer: Federal, State, Local not specified - PPO | Source: Ambulatory Visit | Attending: Hematology and Oncology | Admitting: Hematology and Oncology

## 2020-10-17 ENCOUNTER — Other Ambulatory Visit: Payer: Self-pay

## 2020-10-17 DIAGNOSIS — Z0189 Encounter for other specified special examinations: Secondary | ICD-10-CM | POA: Diagnosis not present

## 2020-10-17 DIAGNOSIS — Z01818 Encounter for other preprocedural examination: Secondary | ICD-10-CM | POA: Diagnosis present

## 2020-10-17 DIAGNOSIS — Z171 Estrogen receptor negative status [ER-]: Secondary | ICD-10-CM | POA: Insufficient documentation

## 2020-10-17 DIAGNOSIS — C50412 Malignant neoplasm of upper-outer quadrant of left female breast: Secondary | ICD-10-CM | POA: Insufficient documentation

## 2020-10-17 LAB — ECHOCARDIOGRAM COMPLETE
Area-P 1/2: 3.31 cm2
S' Lateral: 3.4 cm

## 2020-10-17 NOTE — Progress Notes (Signed)
  Echocardiogram 2D Echocardiogram has been performed.  Rita Cole 10/17/2020, 11:29 AM

## 2020-10-20 ENCOUNTER — Encounter: Payer: Self-pay | Admitting: Hematology and Oncology

## 2020-10-20 ENCOUNTER — Encounter: Payer: Self-pay | Admitting: *Deleted

## 2020-10-20 ENCOUNTER — Inpatient Hospital Stay: Payer: Federal, State, Local not specified - PPO | Admitting: Hematology and Oncology

## 2020-10-20 ENCOUNTER — Encounter: Payer: Self-pay | Admitting: General Practice

## 2020-10-20 ENCOUNTER — Inpatient Hospital Stay: Payer: Federal, State, Local not specified - PPO

## 2020-10-20 ENCOUNTER — Other Ambulatory Visit: Payer: Self-pay

## 2020-10-20 ENCOUNTER — Other Ambulatory Visit: Payer: Federal, State, Local not specified - PPO

## 2020-10-20 ENCOUNTER — Other Ambulatory Visit: Payer: Self-pay | Admitting: Oncology

## 2020-10-20 DIAGNOSIS — Z171 Estrogen receptor negative status [ER-]: Secondary | ICD-10-CM | POA: Diagnosis not present

## 2020-10-20 DIAGNOSIS — C50412 Malignant neoplasm of upper-outer quadrant of left female breast: Secondary | ICD-10-CM | POA: Diagnosis not present

## 2020-10-20 DIAGNOSIS — Z95828 Presence of other vascular implants and grafts: Secondary | ICD-10-CM

## 2020-10-20 LAB — CBC WITH DIFFERENTIAL (CANCER CENTER ONLY)
Abs Immature Granulocytes: 0.02 10*3/uL (ref 0.00–0.07)
Basophils Absolute: 0 10*3/uL (ref 0.0–0.1)
Basophils Relative: 1 %
Eosinophils Absolute: 0.1 10*3/uL (ref 0.0–0.5)
Eosinophils Relative: 2 %
HCT: 35.7 % — ABNORMAL LOW (ref 36.0–46.0)
Hemoglobin: 11.3 g/dL — ABNORMAL LOW (ref 12.0–15.0)
Immature Granulocytes: 0 %
Lymphocytes Relative: 19 %
Lymphs Abs: 1.2 10*3/uL (ref 0.7–4.0)
MCH: 27.5 pg (ref 26.0–34.0)
MCHC: 31.7 g/dL (ref 30.0–36.0)
MCV: 86.9 fL (ref 80.0–100.0)
Monocytes Absolute: 1 10*3/uL (ref 0.1–1.0)
Monocytes Relative: 14 %
Neutro Abs: 4.3 10*3/uL (ref 1.7–7.7)
Neutrophils Relative %: 64 %
Platelet Count: 230 10*3/uL (ref 150–400)
RBC: 4.11 MIL/uL (ref 3.87–5.11)
RDW: 15.3 % (ref 11.5–15.5)
WBC Count: 6.6 10*3/uL (ref 4.0–10.5)
nRBC: 0 % (ref 0.0–0.2)

## 2020-10-20 LAB — CMP (CANCER CENTER ONLY)
ALT: 24 U/L (ref 0–44)
AST: 24 U/L (ref 15–41)
Albumin: 3.6 g/dL (ref 3.5–5.0)
Alkaline Phosphatase: 75 U/L (ref 38–126)
Anion gap: 10 (ref 5–15)
BUN: 14 mg/dL (ref 6–20)
CO2: 30 mmol/L (ref 22–32)
Calcium: 9.6 mg/dL (ref 8.9–10.3)
Chloride: 100 mmol/L (ref 98–111)
Creatinine: 0.87 mg/dL (ref 0.44–1.00)
GFR, Estimated: >60 mL/min (ref >60)
Glucose, Bld: 90 mg/dL (ref 70–99)
Potassium: 3.9 mmol/L (ref 3.5–5.1)
Sodium: 140 mmol/L (ref 135–145)
Total Bilirubin: 0.4 mg/dL (ref 0.3–1.2)
Total Protein: 8.4 g/dL — ABNORMAL HIGH (ref 6.5–8.1)

## 2020-10-20 LAB — TSH: TSH: 4.665 u[IU]/mL — ABNORMAL HIGH (ref 0.308–3.960)

## 2020-10-20 MED ORDER — SODIUM CHLORIDE 0.9 % IV SOLN
150.0000 mg | Freq: Once | INTRAVENOUS | Status: AC
  Administered 2020-10-20: 150 mg via INTRAVENOUS
  Filled 2020-10-20: qty 150

## 2020-10-20 MED ORDER — PALONOSETRON HCL INJECTION 0.25 MG/5ML
INTRAVENOUS | Status: AC
  Filled 2020-10-20: qty 5

## 2020-10-20 MED ORDER — SODIUM CHLORIDE 0.9 % IV SOLN
200.0000 mg | Freq: Once | INTRAVENOUS | Status: AC
  Administered 2020-10-20: 200 mg via INTRAVENOUS
  Filled 2020-10-20: qty 8

## 2020-10-20 MED ORDER — SODIUM CHLORIDE 0.9 % IV SOLN
Freq: Once | INTRAVENOUS | Status: AC
  Filled 2020-10-20: qty 250

## 2020-10-20 MED ORDER — PALONOSETRON HCL INJECTION 0.25 MG/5ML
0.2500 mg | Freq: Once | INTRAVENOUS | Status: AC
  Administered 2020-10-20: 0.25 mg via INTRAVENOUS

## 2020-10-20 MED ORDER — HEPARIN SOD (PORK) LOCK FLUSH 100 UNIT/ML IV SOLN
500.0000 [IU] | Freq: Once | INTRAVENOUS | Status: AC | PRN
  Administered 2020-10-20: 500 [IU]
  Filled 2020-10-20: qty 5

## 2020-10-20 MED ORDER — SODIUM CHLORIDE 0.9 % IV SOLN
600.0000 mg/m2 | Freq: Once | INTRAVENOUS | Status: AC
  Administered 2020-10-20: 1260 mg via INTRAVENOUS
  Filled 2020-10-20: qty 63

## 2020-10-20 MED ORDER — DOXORUBICIN HCL CHEMO IV INJECTION 2 MG/ML
60.0000 mg/m2 | Freq: Once | INTRAVENOUS | Status: AC
  Administered 2020-10-20: 126 mg via INTRAVENOUS
  Filled 2020-10-20: qty 63

## 2020-10-20 MED ORDER — SODIUM CHLORIDE 0.9% FLUSH
10.0000 mL | INTRAVENOUS | Status: DC | PRN
  Administered 2020-10-20: 10 mL
  Filled 2020-10-20: qty 10

## 2020-10-20 MED ORDER — SODIUM CHLORIDE 0.9 % IV SOLN
10.0000 mg | Freq: Once | INTRAVENOUS | Status: AC
  Administered 2020-10-20: 10 mg via INTRAVENOUS
  Filled 2020-10-20: qty 10

## 2020-10-20 MED ORDER — SODIUM CHLORIDE 0.9% FLUSH
10.0000 mL | INTRAVENOUS | Status: DC | PRN
  Administered 2020-10-20: 10 mL via INTRAVENOUS
  Filled 2020-10-20: qty 10

## 2020-10-20 NOTE — Progress Notes (Signed)
Pt provided her proof of income for the [employer] and unfortunately she exceeds the income requirement so she doesn't qualify at this time.

## 2020-10-20 NOTE — Progress Notes (Signed)
Patient Care Team: Riki Sheer, NP as PCP - General (Nurse Practitioner) Mauro Kaufmann, RN as Oncology Nurse Navigator Rockwell Germany, RN as Oncology Nurse Navigator Rolm Bookbinder, MD as Consulting Physician (General Surgery) Nicholas Lose, MD as Consulting Physician (Hematology and Oncology) Kyung Rudd, MD as Consulting Physician (Radiation Oncology)  DIAGNOSIS:    ICD-10-CM   1. Malignant neoplasm of upper-outer quadrant of left breast in female, estrogen receptor negative ([place])  C50.412    Z17.1     SUMMARY OF ONCOLOGIC HISTORY: Oncology History  Malignant neoplasm of upper-outer quadrant of left breast in female, estrogen receptor negative ([place])  09/30/2020 Initial Diagnosis   Swollen left breast: Mammogram detected left breast mass UOQ skin thickening with 3 abnormal lymph nodes that are matted and hard, ultrasound 2.7 cm, 2.9 cm, 3 cm and 3 lymph nodes biopsy revealed grade 2-3 IDC with DCIS ER/PR negative HER-2 IHC equivocal FISH pending, Ki-67 60%, lymph node biopsy positive   10/01/2020 Cancer Staging   Staging form: Breast, AJCC 8th Edition - Clinical stage from 10/01/2020: Stage IIIC (cT4b, cN2a, cM0, G3, ER-, PR-, HER2: Equivocal) - Signed by Nicholas Lose, MD on 10/01/2020 Stage prefix: Initial diagnosis Histologic grading system: 3 grade system   10/13/2020 Genetic Testing   Negative hereditary cancer genetic testing: no pathogenic variants detected in [facility] CustomNext-Cancer +RNAinsight Panel.  The report date is October 13, 2020.   The CustomNext-Cancer+RNAinsight panel offered by [facility] includes sequencing and rearrangement analysis for the following 47 genes:  APC, ATM, AXIN2, BARD1, BMPR1A, BRCA1, BRCA2, BRIP1, CDH1, CDK4, CDKN2A, CHEK2, DICER1, EPCAM, GREM1, HOXB13, MEN1, MLH1, MSH2, MSH3, MSH6, MUTYH, NBN, NF1, NF2, NTHL1, PALB2, PMS2, POLD1, POLE, PTEN, RAD51C, RAD51D, RECQL, RET, SDHA, SDHAF2, SDHB, SDHC, SDHD, SMAD4, SMARCA4, STK11, TP53, TSC1,  TSC2, and VHL.  RNA data is routinely analyzed for use in variant interpretation for all genes.   01/12/2021 -  Chemotherapy    Patient is on Treatment Plan: BREAST PEMBROLIZUMAB + AC Q21D X 4 CYCLES / PEMBROLIZUMAB + CARBOPLATIN D1 + PACLITAXEL D1,8,15 Q21D X 4 CYCLES        CHIEF COMPLIANT: Cycle 1 Adriamycin and Cytoxan  INTERVAL HISTORY: Rita Cole is a 60 y.o. with above-mentioned history of left breast cancer currently on neoadjuvant chemotherapy with dose dense Adriamycin and Cytoxan. Echo on 10/17/20 showed normal function. Her port was placed on 10/14/20. She presents to the clinic today for cycle 1.  She has a PET CT scan appointment coming up in a couple of days.  She reports to me that the left breast appears to be getting engorged and more swollen and uncomfortable.  ALLERGIES:  has No Known Allergies.  MEDICATIONS:  Current Outpatient Medications  Medication Sig Dispense Refill  . benazepril (LOTENSIN) 40 MG tablet Take 40 mg by mouth daily.    . cyclobenzaprine (FLEXERIL) 10 MG tablet Take 10 mg by mouth 3 (three) times daily as needed.    . hydrochlorothiazide (HYDRODIURIL) 25 MG tablet Take 1 tablet (25 mg total) by mouth daily. 30 tablet 0  . ketorolac (TORADOL) 10 MG tablet Take 1 tablet (10 mg total) by mouth every 6 (six) hours as needed. 20 tablet 0  . lidocaine-prilocaine (EMLA) cream Apply to affected area once 30 g 3  . meloxicam (MOBIC) 15 MG tablet Take 1 tablet (15 mg total) by mouth daily. 10 tablet 0  . ondansetron (ZOFRAN) 8 MG tablet Take 1 tablet (8 mg total) by mouth 2 (two)  times daily as needed. Start on the third day after carboplatin and AC chemotherapy. 30 tablet 1  . prochlorperazine (COMPAZINE) 10 MG tablet Take 1 tablet (10 mg total) by mouth every 6 (six) hours as needed (Nausea or vomiting). 30 tablet 1  . tiZANidine (ZANAFLEX) 4 MG tablet Take 4 mg by mouth 3 (three) times daily as needed.    . Vitamin D, Ergocalciferol, (DRISDOL) 1.25  MG (50000 UNIT) CAPS capsule Take 50,000 Units by mouth once a week. Monday     No current facility-administered medications for this visit.   Facility-Administered Medications Ordered in Other Visits  Medication Dose Route Frequency Provider Last Rate Last Admin  . cyclophosphamide (CYTOXAN) 1,260 mg in sodium chloride 0.9 % 250 mL chemo infusion  600 mg/m2 (Treatment Plan Recorded) Intravenous Once Magrinat, Virgie Dad, MD      . DOXOrubicin (ADRIAMYCIN) chemo injection 126 mg  60 mg/m2 (Treatment Plan Recorded) Intravenous Once Magrinat, Virgie Dad, MD      . heparin lock flush 100 unit/mL  500 Units Intracatheter Once PRN Magrinat, Virgie Dad, MD      . pembrolizumab [facility]) 200 mg in sodium chloride 0.9 % 50 mL chemo infusion  200 mg Intravenous Once Magrinat, Virgie Dad, MD      . sodium chloride flush (NS) 0.9 % injection 10 mL  10 mL Intracatheter PRN Magrinat, Virgie Dad, MD        PHYSICAL EXAMINATION: ECOG PERFORMANCE STATUS: 1 - Symptomatic but completely ambulatory  Vitals:   10/20/20 1120  BP: (!) 154/75  Pulse: 85  Resp: 18  Temp: 97.6 F (36.4 C)  SpO2: 100%   Filed Weights   10/20/20 1120  Weight: 211 lb (95.7 kg)    LABORATORY DATA:  I have reviewed the data as listed CMP Latest Ref Rng & Units 10/20/2020 10/01/2020 09/06/2020  Glucose 70 - 99 mg/dL 90 94 90  BUN 6 - 20 mg/dL 14 29(H) 20  Creatinine 0.44 - 1.00 mg/dL 0.87 1.16(H) 1.01(H)  Sodium 135 - 145 mmol/L 140 142 137  Potassium 3.5 - 5.1 mmol/L 3.9 3.2(L) 4.8  Chloride 98 - 111 mmol/L 100 98 99  CO2 22 - 32 mmol/L _0 Calcium 8.9 - 10.3 mg/dL 9.6 9.4 9.6  Total Protein 6.5 - 8.1 g/dL 8.4(H) 8.6(H) -  Total Bilirubin 0.3 - 1.2 mg/dL 0.4 0.4 -  Alkaline Phos 38 - 126 U/L 75 76 -  AST 15 - 41 U/L 24 21 -  ALT 0 - 44 U/L 24 16 -    Lab Results  Component Value Date   WBC 6.6 10/20/2020   HGB 11.3 (L) 10/20/2020   HCT 35.7 (L) 10/20/2020   MCV 86.9 10/20/2020   PLT 230 10/20/2020   NEUTROABS 4.3  10/20/2020    ASSESSMENT & PLAN:  Malignant neoplasm of upper-outer quadrant of left breast in female, estrogen receptor negative ([place]) 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 After reviewing the pathology in detail, we proceeded to discuss the different  treatment options between surgery, radiation, chemotherapy, antiestrogen therapies.  Recommendation based on multidisciplinary tumor board: 1. Neoadjuvant chemotherapy with Adriamycin and Cytoxan dose dense 4 followed by Taxol weekly 12 with carboplatin every 3 weeks x4 (pembrolizumab will be added if she is triple negative) 2. Followed by breast conserving surgery versus mastectomy and axillary lymph node dissection (due to matted axillary lymph nodes) 3. Followed by adjuvant radiation therapy -------------------------------------------------------------------------- Current treatment: Cycle 1 day 1 neoadjuvant Adriamycin Cytoxan and Pembrolizumab  Labs reviewed, antiemetics reviewed, echocardiogram reviewed I will see her next week to do a toxicity evaluation.  She has a PET/CT coming up in a couple of days. Her daughter accompanying her today and will bring her for each of her appointments.  Worsening erythema of the left breast with more swelling: I discussed with the patient that this is malignancy related changes in the breast.  We hope to see an improvement with neoadjuvant chemo.  Return to clinic 1 week for toxicity  check    No orders of the defined types were placed in this encounter.  The patient has a good understanding of the overall plan. she agrees with it. she will call with any problems that may develop before the next visit here.  Total time spent: 30 mins including face to face time and time spent for planning, charting and coordination of care  Rulon Eisenmenger, MD, MPH 10/20/2020  I, Molly Dorshimer, am acting as scribe for Dr. Nicholas Lose.  I have reviewed the above documentation for accuracy and completeness, and I agree with the above.

## 2020-10-20 NOTE — Patient Instructions (Signed)
Implanted Port Insertion, Care After This sheet gives you information about how to care for yourself after your procedure. Your health care provider may also give you more specific instructions. If you have problems or questions, contact your health care provider. What can I expect after the procedure? After the procedure, it is common to have:  Discomfort at the port insertion site.  Bruising on the skin over the port. This should improve over 3-4 days. Follow these instructions at home: Port care  After your port is placed, you will get a manufacturer's information card. The card has information about your port. Keep this card with you at all times.  Take care of the port as told by your health care provider. Ask your health care provider if you or a family member can get training for taking care of the port at home. A home health care nurse may also take care of the port.  Make sure to remember what type of port you have. Incision care  Follow instructions from your health care provider about how to take care of your port insertion site. Make sure you: ? Wash your hands with soap and water before and after you change your bandage (dressing). If soap and water are not available, use hand sanitizer. ? Change your dressing as told by your health care provider. ? Leave stitches (sutures), skin glue, or adhesive strips in place. These skin closures may need to stay in place for 2 weeks or longer. If adhesive strip edges start to loosen and curl up, you may trim the loose edges. Do not remove adhesive strips completely unless your health care provider tells you to do that.  Check your port insertion site every day for signs of infection. Check for: ? Redness, swelling, or pain. ? Fluid or blood. ? Warmth. ? Pus or a bad smell.      Activity  Return to your normal activities as told by your health care provider. Ask your health care provider what activities are safe for you.  Do not  lift anything that is heavier than 10 lb (4.5 kg), or the limit that you are told, until your health care provider says that it is safe. General instructions  Take over-the-counter and prescription medicines only as told by your health care provider.  Do not take baths, swim, or use a hot tub until your health care provider approves. Ask your health care provider if you may take showers. You may only be allowed to take sponge baths.  Do not drive for 24 hours if you were given a sedative during your procedure.  Wear a medical alert bracelet in case of an emergency. This will tell any health care providers that you have a port.  Keep all follow-up visits as told by your health care provider. This is important. Contact a health care provider if:  You cannot flush your port with saline as directed, or you cannot draw blood from the port.  You have a fever or chills.  You have redness, swelling, or pain around your port insertion site.  You have fluid or blood coming from your port insertion site.  Your port insertion site feels warm to the touch.  You have pus or a bad smell coming from the port insertion site. Get help right away if:  You have chest pain or shortness of breath.  You have bleeding from your port that you cannot control. Summary  Take care of the port as told by your   health care provider. Keep the manufacturer's information card with you at all times.  Change your dressing as told by your health care provider.  Contact a health care provider if you have a fever or chills or if you have redness, swelling, or pain around your port insertion site.  Keep all follow-up visits as told by your health care provider. This information is not intended to replace advice given to you by your health care provider. Make sure you discuss any questions you have with your health care provider. Document Revised: 01/10/2018 Document Reviewed: 01/10/2018 Elsevier Patient Education   2021 [employer] Inc.  

## 2020-10-20 NOTE — Progress Notes (Signed)
[place] Spiritual Care Note  Followed up with Vici in infusion, providing empathic listening and emotional support as she shared and processed her nervousness about first treatment, her deep faith about how this experience will strengthen her testimony, and her family support (particularly from daughter Caryl Pina and sister Leana Roe). Pl[facility] referral per her request and plan to follow up at her next treatment. She also has my direct-dial number, which she plans to share with family, for interim support.   [place], [place], [facility] Pager [phone] Voicemail [phone]

## 2020-10-20 NOTE — Assessment & Plan Note (Signed)
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 After reviewing the pathology in detail, we proceeded to discuss the different treatment options between surgery, radiation, chemotherapy, antiestrogen therapies.  Recommendation based on multidisciplinary tumor board: 1. Neoadjuvant chemotherapy with Adriamycin and Cytoxan dose dense 4 followed by Taxol weekly 12 with carboplatin every 3 weeks x4 (pembrolizumab will be added if she is triple negative) 2. Followed by breast conserving surgery versus mastectomy and axillary lymph node dissection (due to matted axillary lymph nodes) 3. Followed by adjuvant radiation therapy -------------------------------------------------------------------------- Current treatment: Cycle 1 day 1 neoadjuvant Adriamycin Cytoxan and Pembrolizumab  Labs reviewed, antiemetics reviewed, echocardiogram reviewed I will see her next week to do a toxicity evaluation.  She has a PET/CT coming up in a couple of days. Her daughter accompanying her today and will bring her for each of her appointments.  Worsening erythema of the left breast with more swelling: I discussed with the patient that this is malignancy related changes in the breast.  We hope to see an improvement with neoadjuvant chemo.  Return to clinic 1 week for toxicity check 

## 2020-10-20 NOTE — Patient Instructions (Signed)
[facility]  Discharge Instructions: Thank you for choosing [facility] to provide your oncology and hematology care.   If you have a lab appointment with the [facility], please go directly to the [facility] and check in at the registration area.   Wear comfortable clothing and clothing appropriate for easy access to any Portacath or PICC line.   We strive to give you quality time with your provider. You may need to reschedule your appointment if you arrive late (15 or more minutes).  Arriving late affects you and other patients whose appointments are after yours.  Also, if you miss three or more appointments without notifying the office, you may be dismissed from the clinic at the provider's discretion.      For prescription refill requests, have your pharmacy contact our office and allow 72 hours for refills to be completed.    Today you received the following chemotherapy and/or immunotherapy agents pembroilizumab, doxorubicin, cyclophosphamide.    To help prevent nausea and vomiting after your treatment, we encourage you to take your nausea medication as directed.  BELOW ARE SYMPTOMS THAT SHOULD BE REPORTED IMMEDIATELY: . *FEVER GREATER THAN 100.4 F (38 C) OR HIGHER . *CHILLS OR SWEATING . *NAUSEA AND VOMITING THAT IS NOT CONTROLLED WITH YOUR NAUSEA MEDICATION . *UNUSUAL SHORTNESS OF BREATH . *UNUSUAL BRUISING OR BLEEDING . *URINARY PROBLEMS (pain or burning when urinating, or frequent urination) . *BOWEL PROBLEMS (unusual diarrhea, constipation, pain near the anus) . TENDERNESS IN MOUTH AND THROAT WITH OR WITHOUT PRESENCE OF ULCERS (sore throat, sores in mouth, or a toothache) . UNUSUAL RASH, SWELLING OR PAIN  . UNUSUAL VAGINAL DISCHARGE OR ITCHING   Items with * indicate a potential emergency and should be followed up as soon as possible or go to the Emergency Department if any problems should occur.  Please show the CHEMOTHERAPY  ALERT CARD or IMMUNOTHERAPY ALERT CARD at check-in to the Emergency Department and triage nurse.  Should you have questions after your visit or need to cancel or reschedule your appointment, please contact [facility]  Dept: [phone]  and follow the prompts.  Office hours are 8:00 a.m. to 4:30 p.m. Monday - Friday. Please note that voicemails left after 4:00 p.m. may not be returned until the following business day.  We are closed weekends and major holidays. You have access to a nurse at all times for urgent questions. Please call the main number to the clinic Dept: [phone] and follow the prompts.   For any non-urgent questions, you may also contact your provider using MyChart. We now offer e-Visits for anyone 74 and older to request care online for non-urgent symptoms. For details visit [email or web address].   Also download the MyChart app! Go to the app store, search "MyChart", open the app, select [facility], and log in with your MyChart username and password.  Due to Covid, a mask is required upon entering the hospital/clinic. If you do not have a mask, one will be given to you upon arrival. For doctor visits, patients may have 1 support person aged 19 or older with them. For treatment visits, patients cannot have anyone with them due to current Covid guidelines and our immunocompromised population.   Pembrolizumab injection What is this medicine? PEMBROLIZUMAB (pem broe liz ue mab) is a monoclonal antibody. It is used to treat certain types of cancer. This medicine may be used for other purposes; ask your health care provider or  pharmacist if you have questions. COMMON BRAND NAME(S): Keytruda What should I tell my health care provider before I take this medicine? They need to know if you have any of these conditions:  autoimmune diseases like Crohn's disease, ulcerative colitis, or lupus  have had or planning to have an allogeneic stem cell  transplant (uses someone else's stem cells)  history of organ transplant  history of chest radiation  nervous system problems like myasthenia gravis or Guillain-Barre syndrome  an unusual or allergic reaction to pembrolizumab, other medicines, foods, dyes, or preservatives  pregnant or trying to get pregnant  breast-feeding How should I use this medicine? This medicine is for infusion into a vein. It is given by a health care professional in a hospital or clinic setting. A special MedGuide will be given to you before each treatment. Be sure to read this information carefully each time. Talk to your pediatrician regarding the use of this medicine in children. While this drug may be prescribed for children as young as 6 months for selected conditions, precautions do apply. Overdosage: If you think you have taken too much of this medicine contact a poison control center or emergency room at once. NOTE: This medicine is only for you. Do not share this medicine with others. What if I miss a dose? It is important not to miss your dose. Call your doctor or health care professional if you are unable to keep an appointment. What may interact with this medicine? Interactions have not been studied. This list may not describe all possible interactions. Give your health care provider a list of all the medicines, herbs, non-prescription drugs, or dietary supplements you use. Also tell them if you smoke, drink alcohol, or use illegal drugs. Some items may interact with your medicine. What should I watch for while using this medicine? Your condition will be monitored carefully while you are receiving this medicine. You may need blood work done while you are taking this medicine. Do not become pregnant while taking this medicine or for 4 months after stopping it. Women should inform their doctor if they wish to become pregnant or think they might be pregnant. There is a potential for serious side effects  to an unborn child. Talk to your health care professional or pharmacist for more information. Do not breast-feed an infant while taking this medicine or for 4 months after the last dose. What side effects may I notice from receiving this medicine? Side effects that you should report to your doctor or health care professional as soon as possible:  allergic reactions like skin rash, itching or hives, swelling of the face, lips, or tongue  bloody or black, tarry  breathing problems  changes in vision  chest pain  chills  confusion  constipation  cough  diarrhea  dizziness or feeling faint or lightheaded  fast or irregular heartbeat  fever  flushing  joint pain  low blood counts - this medicine may decrease the number of white blood cells, red blood cells and platelets. You may be at increased risk for infections and bleeding.  muscle pain  muscle weakness  pain, tingling, numbness in the hands or feet  persistent headache  redness, blistering, peeling or loosening of the skin, including inside the mouth  signs and symptoms of high blood sugar such as dizziness; dry mouth; dry skin; fruity breath; nausea; stomach pain; increased hunger or thirst; increased urination  signs and symptoms of kidney injury like trouble passing urine or change in the  amount of urine  signs and symptoms of liver injury like dark urine, light-colored stools, loss of appetite, nausea, right upper belly pain, yellowing of the eyes or skin  sweating  swollen lymph nodes  weight loss Side effects that usually do not require medical attention (report to your doctor or health care professional if they continue or are bothersome):  decreased appetite  hair loss  tiredness This list may not describe all possible side effects. Call your doctor for medical advice about side effects. You may report side effects to FDA at 1-800-FDA-1088. Where should I keep my medicine? This drug is given  in a hospital or clinic and will not be stored at home. NOTE: This sheet is a summary. It may not cover all possible information. If you have questions about this medicine, talk to your doctor, pharmacist, or health care provider.  2021 Elsevier/Gold Standard (2019-05-16 21:44:53)  Doxorubicin injection What is this medicine? DOXORUBICIN (dox oh ROO bi sin) is a chemotherapy drug. It is used to treat many kinds of cancer like leukemia, lymphoma, neuroblastoma, sarcoma, and Wilms' tumor. It is also used to treat bladder cancer, breast cancer, lung cancer, ovarian cancer, stomach cancer, and thyroid cancer. This medicine may be used for other purposes; ask your health care provider or pharmacist if you have questions. COMMON BRAND NAME(S): Adriamycin, Adriamycin PFS, Adriamycin RDF, Rubex What should I tell my health care provider before I take this medicine? They need to know if you have any of these conditions:  heart disease  history of low blood counts caused by a medicine  liver disease  recent or ongoing radiation therapy  an unusual or allergic reaction to doxorubicin, other chemotherapy agents, other medicines, foods, dyes, or preservatives  pregnant or trying to get pregnant  breast-feeding How should I use this medicine? This drug is given as an infusion into a vein. It is administered in a hospital or clinic by a specially trained health care professional. If you have pain, swelling, burning or any unusual feeling around the site of your injection, tell your health care professional right away. Talk to your pediatrician regarding the use of this medicine in children. Special care may be needed. Overdosage: If you think you have taken too much of this medicine contact a poison control center or emergency room at once. NOTE: This medicine is only for you. Do not share this medicine with others. What if I miss a dose? It is important not to miss your dose. Call your doctor or  health care professional if you are unable to keep an appointment. What may interact with this medicine? This medicine may interact with the following medications:  6-mercaptopurine  paclitaxel  phenytoin  St. John's Wort  trastuzumab  verapamil This list may not describe all possible interactions. Give your health care provider a list of all the medicines, herbs, non-prescription drugs, or dietary supplements you use. Also tell them if you smoke, drink alcohol, or use illegal drugs. Some items may interact with your medicine. What should I watch for while using this medicine? This drug may make you feel generally unwell. This is not uncommon, as chemotherapy can affect healthy cells as well as cancer cells. Report any side effects. Continue your course of treatment even though you feel ill unless your doctor tells you to stop. There is a maximum amount of this medicine you should receive throughout your life. The amount depends on the medical condition being treated and your overall health. Your doctor  will watch how much of this medicine you receive in your lifetime. Tell your doctor if you have taken this medicine before. You may need blood work done while you are taking this medicine. Your urine may turn red for a few days after your dose. This is not blood. If your urine is dark or brown, call your doctor. In some cases, you may be given additional medicines to help with side effects. Follow all directions for their use. Call your doctor or health care professional for advice if you get a fever, chills or sore throat, or other symptoms of a cold or flu. Do not treat yourself. This drug decreases your body's ability to fight infections. Try to avoid being around people who are sick. This medicine may increase your risk to bruise or bleed. Call your doctor or health care professional if you notice any unusual bleeding. Talk to your doctor about your risk of cancer. You may be more at risk  for certain types of cancers if you take this medicine. Do not become pregnant while taking this medicine or for 6 months after stopping it. Women should inform their doctor if they wish to become pregnant or think they might be pregnant. Men should not father a child while taking this medicine and for 6 months after stopping it. There is a potential for serious side effects to an unborn child. Talk to your health care professional or pharmacist for more information. Do not breast-feed an infant while taking this medicine. This medicine has caused ovarian failure in some women and reduced sperm counts in some men This medicine may interfere with the ability to have a child. Talk with your doctor or health care professional if you are concerned about your fertility. This medicine may cause a decrease in Co-Enzyme Q-10. You should make sure that you get enough Co-Enzyme Q-10 while you are taking this medicine. Discuss the foods you eat and the vitamins you take with your health care professional. What side effects may I notice from receiving this medicine? Side effects that you should report to your doctor or health care professional as soon as possible:  allergic reactions like skin rash, itching or hives, swelling of the face, lips, or tongue  breathing problems  chest pain  fast or irregular heartbeat  low blood counts - this medicine may decrease the number of white blood cells, red blood cells and platelets. You may be at increased risk for infections and bleeding.  pain, redness, or irritation at site where injected  signs of infection - fever or chills, cough, sore throat, pain or difficulty passing urine  signs of decreased platelets or bleeding - bruising, pinpoint red spots on the skin, black, tarry stools, blood in the urine  swelling of the ankles, feet, hands  tiredness  weakness Side effects that usually do not require medical attention (report to your doctor or health care  professional if they continue or are bothersome):  diarrhea  hair loss  mouth sores  nail discoloration or damage  nausea  red colored urine  vomiting This list may not describe all possible side effects. Call your doctor for medical advice about side effects. You may report side effects to FDA at 1-800-FDA-1088. Where should I keep my medicine? This drug is given in a hospital or clinic and will not be stored at home. NOTE: This sheet is a summary. It may not cover all possible information. If you have questions about this medicine, talk to your doctor,  pharmacist, or health care provider.  2021 Elsevier/Gold Standard (2017-01-26 11:01:26)  Cyclophosphamide Injection What is this medicine? CYCLOPHOSPHAMIDE (sye kloe FOSS fa mide) is a chemotherapy drug. It slows the growth of cancer cells. This medicine is used to treat many types of cancer like lymphoma, myeloma, leukemia, breast cancer, and ovarian cancer, to name a few. This medicine may be used for other purposes; ask your health care provider or pharmacist if you have questions. COMMON BRAND NAME(S): Cytoxan, Neosar What should I tell my health care provider before I take this medicine? They need to know if you have any of these conditions:  heart disease  history of irregular heartbeat  infection  kidney disease  liver disease  low blood counts, like white cells, platelets, or red blood cells  on hemodialysis  recent or ongoing radiation therapy  scarring or thickening of the lungs  trouble passing urine  an unusual or allergic reaction to cyclophosphamide, other medicines, foods, dyes, or preservatives  pregnant or trying to get pregnant  breast-feeding How should I use this medicine? This drug is usually given as an injection into a vein or muscle or by infusion into a vein. It is administered in a hospital or clinic by a specially trained health care professional. Talk to your pediatrician regarding  the use of this medicine in children. Special care may be needed. Overdosage: If you think you have taken too much of this medicine contact a poison control center or emergency room at once. NOTE: This medicine is only for you. Do not share this medicine with others. What if I miss a dose? It is important not to miss your dose. Call your doctor or health care professional if you are unable to keep an appointment. What may interact with this medicine?  amphotericin B  azathioprine  certain antivirals for HIV or hepatitis  certain medicines for blood pressure, heart disease, irregular heart beat  certain medicines that treat or prevent blood clots like warfarin  certain other medicines for cancer  cyclosporine  etanercept  indomethacin  medicines that relax muscles for surgery  medicines to increase blood counts  metronidazole This list may not describe all possible interactions. Give your health care provider a list of all the medicines, herbs, non-prescription drugs, or dietary supplements you use. Also tell them if you smoke, drink alcohol, or use illegal drugs. Some items may interact with your medicine. What should I watch for while using this medicine? Your condition will be monitored carefully while you are receiving this medicine. You may need blood work done while you are taking this medicine. Drink water or other fluids as directed. Urinate often, even at night. Some products may contain alcohol. Ask your health care professional if this medicine contains alcohol. Be sure to tell all health care professionals you are taking this medicine. Certain medicines, like metronidazole and disulfiram, can cause an unpleasant reaction when taken with alcohol. The reaction includes flushing, headache, nausea, vomiting, sweating, and increased thirst. The reaction can last from 30 minutes to several hours. Do not become pregnant while taking this medicine or for 1 year after stopping  it. Women should inform their health care professional if they wish to become pregnant or think they might be pregnant. Men should not father a child while taking this medicine and for 4 months after stopping it. There is potential for serious side effects to an unborn child. Talk to your health care professional for more information. Do not breast-feed an infant while  taking this medicine or for 1 week after stopping it. This medicine has caused ovarian failure in some women. This medicine may make it more difficult to get pregnant. Talk to your health care professional if you are concerned about your fertility. This medicine has caused decreased sperm counts in some men. This may make it more difficult to father a child. Talk to your health care professional if you are concerned about your fertility. Call your health care professional for advice if you get a fever, chills, or sore throat, or other symptoms of a cold or flu. Do not treat yourself. This medicine decreases your body's ability to fight infections. Try to avoid being around people who are sick. Avoid taking medicines that contain aspirin, acetaminophen, ibuprofen, naproxen, or ketoprofen unless instructed by your health care professional. These medicines may hide a fever. Talk to your health care professional about your risk of cancer. You may be more at risk for certain types of cancer if you take this medicine. If you are going to need surgery or other procedure, tell your health care professional that you are using this medicine. Be careful brushing or flossing your teeth or using a toothpick because you may get an infection or bleed more easily. If you have any dental work done, tell your dentist you are receiving this medicine. What side effects may I notice from receiving this medicine? Side effects that you should report to your doctor or health care professional as soon as possible:  allergic reactions like skin rash, itching or  hives, swelling of the face, lips, or tongue  breathing problems  nausea, vomiting  signs and symptoms of bleeding such as bloody or black, tarry stools; red or dark brown urine; spitting up blood or brown material that looks like coffee grounds; red spots on the skin; unusual bruising or bleeding from the eyes, gums, or nose  signs and symptoms of heart failure like fast, irregular heartbeat, sudden weight gain; swelling of the ankles, feet, hands  signs and symptoms of infection like fever; chills; cough; sore throat; pain or trouble passing urine  signs and symptoms of kidney injury like trouble passing urine or change in the amount of urine  signs and symptoms of liver injury like dark yellow or brown urine; general ill feeling or flu-like symptoms; light-colored stools; loss of appetite; nausea; right upper belly pain; unusually weak or tired; yellowing of the eyes or skin Side effects that usually do not require medical attention (report to your doctor or health care professional if they continue or are bothersome):  confusion  decreased hearing  diarrhea  facial flushing  hair loss  headache  loss of appetite  missed menstrual periods  signs and symptoms of low red blood cells or anemia such as unusually weak or tired; feeling faint or lightheaded; falls  skin discoloration This list may not describe all possible side effects. Call your doctor for medical advice about side effects. You may report side effects to FDA at 1-800-FDA-1088. Where should I keep my medicine? This drug is given in a hospital or clinic and will not be stored at home. NOTE: This sheet is a summary. It may not cover all possible information. If you have questions about this medicine, talk to your doctor, pharmacist, or health care provider.  2021 Elsevier/Gold Standard (2019-03-19 09:53:29)

## 2020-10-20 NOTE — Progress Notes (Unsigned)
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  At the time the orders were written HER2 was equivocal but now its been confirmed negative with a ratio of 1.27 and #2.10.  Patient is echocardiography on 10/17/2020 shows a normal left ventricular function.  Orders have been signed

## 2020-10-21 ENCOUNTER — Telehealth: Payer: Self-pay | Admitting: *Deleted

## 2020-10-21 LAB — T4: T4, Total: 9.7 ug/dL (ref 4.5–12.0)

## 2020-10-22 ENCOUNTER — Ambulatory Visit (HOSPITAL_COMMUNITY)
Admission: RE | Admit: 2020-10-22 | Discharge: 2020-10-22 | Disposition: A | Discharge status: Home / Self Care | Payer: Federal, State, Local not specified - PPO | Source: Ambulatory Visit | Attending: Hematology and Oncology | Admitting: Hematology and Oncology

## 2020-10-22 ENCOUNTER — Inpatient Hospital Stay: Payer: Federal, State, Local not specified - PPO

## 2020-10-22 ENCOUNTER — Other Ambulatory Visit: Payer: Self-pay

## 2020-10-22 VITALS — BP 142/68 | HR 90 | Resp 20

## 2020-10-22 DIAGNOSIS — Z171 Estrogen receptor negative status [ER-]: Secondary | ICD-10-CM | POA: Diagnosis present

## 2020-10-22 DIAGNOSIS — C50412 Malignant neoplasm of upper-outer quadrant of left female breast: Secondary | ICD-10-CM

## 2020-10-22 LAB — GLUCOSE, CAPILLARY: Glucose-Capillary: 84 mg/dL (ref 70–99)

## 2020-10-22 MED ORDER — FLUDEOXYGLUCOSE F - 18 (FDG) INJECTION
10.5800 | Freq: Once | INTRAVENOUS | Status: AC | PRN
  Administered 2020-10-22: 10.58 via INTRAVENOUS

## 2020-10-22 MED ORDER — PEGFILGRASTIM-JMDB 6 MG/0.6ML ~~LOC~~ SOSY
PREFILLED_SYRINGE | SUBCUTANEOUS | Status: AC
  Filled 2020-10-22: qty 0.6

## 2020-10-22 MED ORDER — PEGFILGRASTIM-JMDB 6 MG/0.6ML ~~LOC~~ SOSY
6.0000 mg | PREFILLED_SYRINGE | Freq: Once | SUBCUTANEOUS | Status: AC
  Administered 2020-10-22: 6 mg via SUBCUTANEOUS

## 2020-10-22 NOTE — Patient Instructions (Signed)
Pegfilgrastim injection What is this medicine? PEGFILGRASTIM (PEG fil gra stim) is a long-acting granulocyte colony-stimulating factor that stimulates the growth of neutrophils, a type of white blood cell important in the body's fight against infection. It is used to reduce the incidence of fever and infection in patients with certain types of cancer who are receiving chemotherapy that affects the bone marrow, and to increase survival after being exposed to high doses of radiation. This medicine may be used for other purposes; ask your health care provider or pharmacist if you have questions. COMMON BRAND NAME(S): Fulphila, Neulasta, Nyvepria, UDENYCA, Ziextenzo What should I tell my health care provider before I take this medicine? They need to know if you have any of these conditions:  kidney disease  latex allergy  ongoing radiation therapy  sickle cell disease  skin reactions to acrylic adhesives (On-Body Injector only)  an unusual or allergic reaction to pegfilgrastim, filgrastim, other medicines, foods, dyes, or preservatives  pregnant or trying to get pregnant  breast-feeding How should I use this medicine? This medicine is for injection under the skin. If you get this medicine at home, you will be taught how to prepare and give the pre-filled syringe or how to use the On-body Injector. Refer to the patient Instructions for Use for detailed instructions. Use exactly as directed. Tell your healthcare provider immediately if you suspect that the On-body Injector may not have performed as intended or if you suspect the use of the On-body Injector resulted in a missed or partial dose. It is important that you put your used needles and syringes in a special sharps container. Do not put them in a trash can. If you do not have a sharps container, call your pharmacist or healthcare provider to get one. Talk to your pediatrician regarding the use of this medicine in children. While this drug  may be prescribed for selected conditions, precautions do apply. Overdosage: If you think you have taken too much of this medicine contact a poison control center or emergency room at once. NOTE: This medicine is only for you. Do not share this medicine with others. What if I miss a dose? It is important not to miss your dose. Call your doctor or health care professional if you miss your dose. If you miss a dose due to an On-body Injector failure or leakage, a new dose should be administered as soon as possible using a single prefilled syringe for manual use. What may interact with this medicine? Interactions have not been studied. This list may not describe all possible interactions. Give your health care provider a list of all the medicines, herbs, non-prescription drugs, or dietary supplements you use. Also tell them if you smoke, drink alcohol, or use illegal drugs. Some items may interact with your medicine. What should I watch for while using this medicine? Your condition will be monitored carefully while you are receiving this medicine. You may need blood work done while you are taking this medicine. Talk to your health care provider about your risk of cancer. You may be more at risk for certain types of cancer if you take this medicine. If you are going to need a MRI, CT scan, or other procedure, tell your doctor that you are using this medicine (On-Body Injector only). What side effects may I notice from receiving this medicine? Side effects that you should report to your doctor or health care professional as soon as possible:  allergic reactions (skin rash, itching or hives, swelling of   the face, lips, or tongue)  back pain  dizziness  fever  pain, redness, or irritation at site where injected  pinpoint red spots on the skin  red or dark-brown urine  shortness of breath or breathing problems  stomach or side pain, or pain at the shoulder  swelling  tiredness  trouble  passing urine or change in the amount of urine  unusual bruising or bleeding Side effects that usually do not require medical attention (report to your doctor or health care professional if they continue or are bothersome):  bone pain  muscle pain This list may not describe all possible side effects. Call your doctor for medical advice about side effects. You may report side effects to FDA at 1-800-FDA-1088. Where should I keep my medicine? Keep out of the reach of children. If you are using this medicine at home, you will be instructed on how to store it. Throw away any unused medicine after the expiration date on the label. NOTE: This sheet is a summary. It may not cover all possible information. If you have questions about this medicine, talk to your doctor, pharmacist, or health care provider.  2021 Elsevier/Gold Standard (2019-07-06 13:20:51)  

## 2020-10-23 ENCOUNTER — Encounter: Payer: Self-pay | Admitting: *Deleted

## 2020-10-23 ENCOUNTER — Telehealth: Payer: Self-pay | Admitting: General Practice

## 2020-10-23 NOTE — Telephone Encounter (Signed)
[facility] CSW Progress Notes  Spoke with patient, invited her to Living with Cancer support group.  Discussed general information on the group and her situation.  Edwyna Shell, LCSW Clinical Social Worker Phone:  [phone]

## 2020-10-26 NOTE — Assessment & Plan Note (Signed)
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 Neoadjuvant chemotherapy with Adriamycin and Cytoxan dose dense 4 followed by Taxol weekly 12 with carboplatin every 3 weeks x4(pembrolizumab will be added if she is triple negative) 2. Followed bybreast conserving surgery versusmastectomy andaxillary lymph node dissection (due to matted axillary lymph nodes) 3. Followed by adjuvant radiation therapy -------------------------------------------------------------------------- Current treatment: Cycle 1 day 8 neoadjuvant Adriamycin Cytoxan and Pembrolizumab Toxicities:  RTC in 2 weeks for cycle 2

## 2020-10-26 NOTE — Progress Notes (Signed)
Patient Care Team: Riki Sheer, NP as PCP - General (Nurse Practitioner) Mauro Kaufmann, RN as Oncology Nurse Navigator Rockwell Germany, RN as Oncology Nurse Navigator Rolm Bookbinder, MD as Consulting Physician (General Surgery) Nicholas Lose, MD as Consulting Physician (Hematology and Oncology) Kyung Rudd, MD as Consulting Physician (Radiation Oncology)  DIAGNOSIS:    ICD-10-CM   1. 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  The report date is October 13, 2020.   The CustomNext-Cancer+RNAinsight panel offered by [facility] includes sequencing and rearrangement analysis for the following 47 genes:  APC, ATM, AXIN2, BARD1, BMPR1A, BRCA1, BRCA2, BRIP1, CDH1, CDK4, CDKN2A, CHEK2, DICER1, EPCAM, GREM1, HOXB13, MEN1, MLH1, MSH2, MSH3, MSH6, MUTYH, NBN, NF1, NF2, NTHL1, PALB2, PMS2, POLD1, POLE, PTEN, RAD51C, RAD51D, RECQL, RET, SDHA, SDHAF2, SDHB, SDHC, SDHD, SMAD4, SMARCA4, STK11, TP53, TSC1,  TSC2, and VHL.  RNA data is routinely analyzed for use in variant interpretation for all genes.   01/12/2021 -  Chemotherapy    Patient is on Treatment Plan: BREAST PEMBROLIZUMAB + AC Q21D X 4 CYCLES / PEMBROLIZUMAB + CARBOPLATIN D1 + PACLITAXEL D1,8,15 Q21D X 4 CYCLES        CHIEF COMPLIANT: Cycle 1 Day 8 Adriamycin and Cytoxan  INTERVAL HISTORY: Rita Cole is a 60 y.o. with above-mentioned history of left breast cancer currently on neoadjuvant chemotherapy with dose dense Adriamycin and Cytoxan. PET scan on 10/22/20 showed the known left breast and axilla malignancies and no evidence of metastatic disease.  She tolerated chemotherapy extremely well without any problems or concerns.  Denies any nausea or vomiting.  She does feel somewhat tired but because of steroids she had a burst of energy and she was able to do a lot of household chores.  ALLERGIES:  has No Known Allergies.  MEDICATIONS:  Current Outpatient Medications  Medication Sig Dispense Refill  . benazepril (LOTENSIN) 40 MG tablet Take 40 mg by mouth daily.    . cyclobenzaprine (FLEXERIL) 10 MG tablet Take 10 mg by mouth 3 (three) times daily as needed.    . hydrochlorothiazide (HYDRODIURIL) 25 MG tablet Take 1 tablet (25 mg total) by mouth daily. 30 tablet 0  . ketorolac (TORADOL) 10 MG tablet Take 1 tablet (10 mg total) by mouth every 6 (six) hours as needed. 20 tablet 0  . lidocaine-prilocaine (EMLA) cream Apply to affected area once 30 g 3  . meloxicam (MOBIC) 15 MG tablet Take 1 tablet (15 mg total) by mouth daily. 10 tablet 0  . ondansetron (ZOFRAN) 8 MG tablet Take  1 tablet (8 mg total) by mouth 2 (two) times daily as needed. Start on the third day after carboplatin and AC chemotherapy. 30 tablet 1  . prochlorperazine (COMPAZINE) 10 MG tablet Take 1 tablet (10 mg total) by mouth every 6 (six) hours as needed (Nausea or vomiting). 30 tablet 1  . tiZANidine (ZANAFLEX) 4 MG tablet Take 4 mg by mouth 3 (three) times  daily as needed.    . Vitamin D, Ergocalciferol, (DRISDOL) 1.25 MG (50000 UNIT) CAPS capsule Take 50,000 Units by mouth once a week. Monday     No current facility-administered medications for this visit.    PHYSICAL EXAMINATION: ECOG PERFORMANCE STATUS: 1 - Symptomatic but completely ambulatory  Vitals:   10/27/20 1228  BP: (!) 163/89  Pulse: 77  Resp: 18  Temp: 97.8 F (36.6 C)  SpO2: 99%   Filed Weights   10/27/20 1228  Weight: 212 lb 8 oz (96.4 kg)    LABORATORY DATA:  I have reviewed the data as listed CMP Latest Ref Rng & Units 10/27/2020 10/20/2020 10/01/2020  Glucose 70 - 99 mg/dL 90 90 94  BUN 6 - 20 mg/dL 20 14 29(H)  Creatinine 0.44 - 1.00 mg/dL 0.87 0.87 1.16(H)  Sodium 135 - 145 mmol/L 139 140 142  Potassium 3.5 - 5.1 mmol/L 3.6 3.9 3.2(L)  Chloride 98 - 111 mmol/L 99 100 98  CO2 22 - 32 mmol/L $RemoveB'30 30 29  'rcspwXHE$ Calcium 8.9 - 10.3 mg/dL 9.4 9.6 9.4  Total Protein 6.5 - 8.1 g/dL 7.6 8.4(H) 8.6(H)  Total Bilirubin 0.3 - 1.2 mg/dL 0.5 0.4 0.4  Alkaline Phos 38 - 126 U/L 87 75 76  AST 15 - 41 U/L $Remo'19 24 21  'EsvEq$ ALT 0 - 44 U/L $Remo'10 24 16    'RpGXk$ Lab Results  Component Value Date   WBC 1.7 (L) 10/27/2020   HGB 10.2 (L) 10/27/2020   HCT 31.5 (L) 10/27/2020   MCV 85.1 10/27/2020   PLT 127 (L) 10/27/2020   NEUTROABS 1.2 (L) 10/27/2020    ASSESSMENT & PLAN:  Malignant neoplasm of upper-outer quadrant of left breast in female, estrogen receptor negative ([place]) 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 Neoadjuvant chemotherapy with Adriamycin and Cytoxan dose dense 4 followed by Taxol weekly 12 with carboplatin every 3 weeks x4(pembrolizumab will be added if she is triple  negative) 2. Followed bybreast conserving surgery versusmastectomy andaxillary lymph node dissection (due to matted axillary lymph nodes) 3. Followed by adjuvant radiation therapy PET CT scan: Known breast cancer and lymphadenopathy in the axilla, supraclavicular, pectoral, internal mammary lymph nodes, no distant metastatic disease -------------------------------------------------------------------------- Current treatment: Cycle 1 day 8 neoadjuvant Adriamycin Cytoxan and Pembrolizumab Toxicities: Denies any nausea or vomiting. Fatigue Leukopenia: Expected for day 8 ANC today is 1.2 Chemotherapy-induced anemia today's hemoglobin 10.2  RTC in 2 weeks for cycle 2    No orders of the defined types were placed in this encounter.  The patient has a good understanding of the overall plan. she agrees with it. she will call with any problems that may develop before the next visit here.  Total time spent: 30 mins including face to face time and time spent for planning, charting and  coordination of care  Rulon Eisenmenger, MD, MPH 10/27/2020  I, Molly Dorshimer, am acting as scribe for Dr. Nicholas Lose.  I have reviewed the above documentation for accuracy and completeness, and I agree with the above.

## 2020-10-27 ENCOUNTER — Other Ambulatory Visit: Payer: Federal, State, Local not specified - PPO

## 2020-10-27 ENCOUNTER — Inpatient Hospital Stay: Payer: Federal, State, Local not specified - PPO | Attending: Hematology and Oncology

## 2020-10-27 ENCOUNTER — Other Ambulatory Visit: Payer: Self-pay

## 2020-10-27 ENCOUNTER — Inpatient Hospital Stay (HOSPITAL_BASED_OUTPATIENT_CLINIC_OR_DEPARTMENT_OTHER): Payer: Federal, State, Local not specified - PPO | Admitting: Hematology and Oncology

## 2020-10-27 ENCOUNTER — Encounter: Payer: Self-pay | Admitting: *Deleted

## 2020-10-27 DIAGNOSIS — T451X5A Adverse effect of antineoplastic and immunosuppressive drugs, initial encounter: Secondary | ICD-10-CM | POA: Diagnosis not present

## 2020-10-27 DIAGNOSIS — C50412 Malignant neoplasm of upper-outer quadrant of left female breast: Secondary | ICD-10-CM | POA: Diagnosis present

## 2020-10-27 DIAGNOSIS — Z79899 Other long term (current) drug therapy: Secondary | ICD-10-CM | POA: Insufficient documentation

## 2020-10-27 DIAGNOSIS — Z5111 Encounter for antineoplastic chemotherapy: Secondary | ICD-10-CM | POA: Diagnosis present

## 2020-10-27 DIAGNOSIS — Z171 Estrogen receptor negative status [ER-]: Secondary | ICD-10-CM | POA: Insufficient documentation

## 2020-10-27 DIAGNOSIS — Z5112 Encounter for antineoplastic immunotherapy: Secondary | ICD-10-CM | POA: Insufficient documentation

## 2020-10-27 DIAGNOSIS — R5383 Other fatigue: Secondary | ICD-10-CM | POA: Diagnosis not present

## 2020-10-27 DIAGNOSIS — D72819 Decreased white blood cell count, unspecified: Secondary | ICD-10-CM | POA: Diagnosis not present

## 2020-10-27 DIAGNOSIS — Z5189 Encounter for other specified aftercare: Secondary | ICD-10-CM | POA: Insufficient documentation

## 2020-10-27 DIAGNOSIS — D6481 Anemia due to antineoplastic chemotherapy: Secondary | ICD-10-CM | POA: Insufficient documentation

## 2020-10-27 LAB — TSH: TSH: 2.942 u[IU]/mL (ref 0.350–4.500)

## 2020-10-27 LAB — CBC WITH DIFFERENTIAL (CANCER CENTER ONLY)
Abs Immature Granulocytes: 0.02 10*3/uL (ref 0.00–0.07)
Basophils Absolute: 0 10*3/uL (ref 0.0–0.1)
Basophils Relative: 2 %
Eosinophils Absolute: 0.1 10*3/uL (ref 0.0–0.5)
Eosinophils Relative: 3 %
HCT: 31.5 % — ABNORMAL LOW (ref 36.0–46.0)
Hemoglobin: 10.2 g/dL — ABNORMAL LOW (ref 12.0–15.0)
Immature Granulocytes: 1 %
Lymphocytes Relative: 23 %
Lymphs Abs: 0.4 10*3/uL — ABNORMAL LOW (ref 0.7–4.0)
MCH: 27.6 pg (ref 26.0–34.0)
MCHC: 32.4 g/dL (ref 30.0–36.0)
MCV: 85.1 fL (ref 80.0–100.0)
Monocytes Absolute: 0.1 10*3/uL (ref 0.1–1.0)
Monocytes Relative: 5 %
Neutro Abs: 1.2 10*3/uL — ABNORMAL LOW (ref 1.7–7.7)
Neutrophils Relative %: 66 %
Platelet Count: 127 10*3/uL — ABNORMAL LOW (ref 150–400)
RBC: 3.7 MIL/uL — ABNORMAL LOW (ref 3.87–5.11)
RDW: 15.4 % (ref 11.5–15.5)
WBC Count: 1.7 10*3/uL — ABNORMAL LOW (ref 4.0–10.5)
nRBC: 0 % (ref 0.0–0.2)

## 2020-10-27 LAB — CMP (CANCER CENTER ONLY)
ALT: 10 U/L (ref 0–44)
AST: 19 U/L (ref 15–41)
Albumin: 3.5 g/dL (ref 3.5–5.0)
Alkaline Phosphatase: 87 U/L (ref 38–126)
Anion gap: 10 (ref 5–15)
BUN: 20 mg/dL (ref 6–20)
CO2: 30 mmol/L (ref 22–32)
Calcium: 9.4 mg/dL (ref 8.9–10.3)
Chloride: 99 mmol/L (ref 98–111)
Creatinine: 0.87 mg/dL (ref 0.44–1.00)
GFR, Estimated: >60 mL/min (ref >60)
Glucose, Bld: 90 mg/dL (ref 70–99)
Potassium: 3.6 mmol/L (ref 3.5–5.1)
Sodium: 139 mmol/L (ref 135–145)
Total Bilirubin: 0.5 mg/dL (ref 0.3–1.2)
Total Protein: 7.6 g/dL (ref 6.5–8.1)

## 2020-10-27 MED ORDER — HEPARIN SOD (PORK) LOCK FLUSH 100 UNIT/ML IV SOLN
500.0000 [IU] | Freq: Once | INTRAVENOUS | Status: AC
  Administered 2020-10-27: 500 [IU] via INTRAVENOUS
  Filled 2020-10-27: qty 5

## 2020-10-27 MED ORDER — SODIUM CHLORIDE 0.9% FLUSH
10.0000 mL | Freq: Once | INTRAVENOUS | Status: AC
Start: 2020-10-27 — End: 2020-10-27
  Administered 2020-10-27: 10 mL via INTRAVENOUS
  Filled 2020-10-27: qty 10

## 2020-10-28 LAB — T4: T4, Total: 10.6 ug/dL (ref 4.5–12.0)

## 2020-11-04 ENCOUNTER — Encounter: Payer: Self-pay | Admitting: Hematology and Oncology

## 2020-11-04 NOTE — Progress Notes (Signed)
Pthas been enrolledw/theMerckCopay Assistance programfor Keytruda for $25,000 from 06/28/20- 06/27/21.Hercopay for Keytruda will be $25.  

## 2020-11-09 NOTE — Progress Notes (Signed)
Patient Care Team: Riki Sheer, NP as PCP - General (Nurse Practitioner) Mauro Kaufmann, RN as Oncology Nurse Navigator Rockwell Germany, RN as Oncology Nurse Navigator Rolm Bookbinder, MD as Consulting Physician (General Surgery) Nicholas Lose, MD as Consulting Physician (Hematology and Oncology) Kyung Rudd, MD as Consulting Physician (Radiation Oncology)  DIAGNOSIS:    ICD-10-CM   1. 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  The report date is October 13, 2020.   The CustomNext-Cancer+RNAinsight panel offered by [facility] includes sequencing and rearrangement analysis for the following 47 genes:  APC, ATM, AXIN2, BARD1, BMPR1A, BRCA1, BRCA2, BRIP1, CDH1, CDK4, CDKN2A, CHEK2, DICER1, EPCAM, GREM1, HOXB13, MEN1, MLH1, MSH2, MSH3, MSH6, MUTYH, NBN, NF1, NF2, NTHL1, PALB2, PMS2, POLD1, POLE, PTEN, RAD51C, RAD51D, RECQL, RET, SDHA, SDHAF2, SDHB, SDHC, SDHD, SMAD4, SMARCA4, STK11, TP53, TSC1,  TSC2, and VHL.  RNA data is routinely analyzed for use in variant interpretation for all genes.   01/12/2021 -  Chemotherapy    Patient is on Treatment Plan: BREAST PEMBROLIZUMAB + AC Q21D X 4 CYCLES / PEMBROLIZUMAB + CARBOPLATIN D1 + PACLITAXEL D1,8,15 Q21D X 4 CYCLES        CHIEF COMPLIANT: Cycle 2 Day 1 Adriamycin and Cytoxan  INTERVAL HISTORY: Rita Cole is a 60 y.o. with above-mentioned history of left breast cancer currently on neoadjuvant chemotherapy withdose denseAdriamycin and Cytoxan. She presents to the clinic today for treatment.   ALLERGIES:  has No Known Allergies.  MEDICATIONS:  Current Outpatient Medications  Medication Sig Dispense Refill  . benazepril (LOTENSIN) 40 MG tablet Take 40 mg by mouth daily.    . cyclobenzaprine (FLEXERIL) 10 MG tablet Take 10 mg by mouth 3 (three) times daily as needed.    . hydrochlorothiazide (HYDRODIURIL) 25 MG tablet Take 1 tablet (25 mg total) by mouth daily. 30 tablet 0  . ketorolac (TORADOL) 10 MG tablet Take 1 tablet (10 mg total) by mouth every 6 (six) hours as needed. 20 tablet 0  . lidocaine-prilocaine (EMLA) cream Apply to affected area once 30 g 3  . meloxicam (MOBIC) 15 MG tablet Take 1 tablet (15 mg total) by mouth daily. 10 tablet 0  . ondansetron (ZOFRAN) 8 MG tablet Take 1 tablet (8 mg total) by mouth 2 (two) times daily as needed. Start on the third day after carboplatin and AC chemotherapy. 30 tablet 1  . prochlorperazine (COMPAZINE) 10 MG tablet Take 1 tablet (10 mg total) by mouth every 6 (six) hours as needed (Nausea or vomiting). 30 tablet 1  .  tiZANidine (ZANAFLEX) 4 MG tablet Take 4 mg by mouth 3 (three) times daily as needed.    . Vitamin D, Ergocalciferol, (DRISDOL) 1.25 MG (50000 UNIT) CAPS capsule Take 50,000 Units by mouth once a week. Monday     No current facility-administered medications for this visit.    PHYSICAL EXAMINATION: ECOG PERFORMANCE STATUS: 1 - Symptomatic but completely  ambulatory  Vitals:   11/10/20 1113  BP: (!) 147/78  Pulse: 88  Resp: 18  Temp: (!) 97.3 F (36.3 C)  SpO2: 100%   Filed Weights   11/10/20 1113  Weight: 211 lb 11.2 oz (96 kg)     LABORATORY DATA:  I have reviewed the data as listed CMP Latest Ref Rng & Units 10/27/2020 10/20/2020 10/01/2020  Glucose 70 - 99 mg/dL 90 90 94  BUN 6 - 20 mg/dL 20 14 29(H)  Creatinine 0.44 - 1.00 mg/dL 0.87 0.87 1.16(H)  Sodium 135 - 145 mmol/L 139 140 142  Potassium 3.5 - 5.1 mmol/L 3.6 3.9 3.2(L)  Chloride 98 - 111 mmol/L 99 100 98  CO2 22 - 32 mmol/L $RemoveB'30 30 29  'BLOlvILp$ Calcium 8.9 - 10.3 mg/dL 9.4 9.6 9.4  Total Protein 6.5 - 8.1 g/dL 7.6 8.4(H) 8.6(H)  Total Bilirubin 0.3 - 1.2 mg/dL 0.5 0.4 0.4  Alkaline Phos 38 - 126 U/L 87 75 76  AST 15 - 41 U/L $Remo'19 24 21  'TodLD$ ALT 0 - 44 U/L $Remo'10 24 16    'VwJFW$ Lab Results  Component Value Date   WBC 8.5 11/10/2020   HGB 10.3 (L) 11/10/2020   HCT 31.6 (L) 11/10/2020   MCV 86.8 11/10/2020   PLT 288 11/10/2020   NEUTROABS 6.4 11/10/2020    ASSESSMENT & PLAN:  Malignant neoplasm of upper-outer quadrant of left breast in female, estrogen receptor negative ([facility]) 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 Neoadjuvant chemotherapy with Adriamycin and Cytoxan dose dense 4 followed by Taxol weekly 12 with carboplatin every 3 weeks x4(pembrolizumab will be added if she is triple negative) 2. Followed bybreast conserving surgery versusmastectomy andaxillary lymph node dissection (due to matted axillary lymph nodes) 3. Followed by adjuvant radiation therapy PET CT scan: Known breast cancer and lymphadenopathy in the axilla, supraclavicular, pectoral, internal mammary lymph  nodes, no distant metastatic disease -------------------------------------------------------------------------- Current treatment:Cycle 2 neoadjuvant Adriamycin Cytoxan and Pembrolizumab Toxicities: Denies any nausea or vomiting. 1. Fatigue 2. Leukopenia: Expected for day 8  3. Chemotherapy-induced anemia today's hemoglobin 10.3  Monitoring closely for immunotherapy related adverse effects. Patient thinks that the breast is doing much better since chemo Started.  RTC in 3 weeks for cycle 3    No orders of the defined types were placed in this encounter.  The patient has a good understanding of the overall plan. she agrees with it. she will call with any problems that may develop before the next visit here.  Total time spent: 30 mins including face to face time and time spent for planning, charting and coordination of care  Rulon Eisenmenger, MD, MPH 11/10/2020  I, Cloyde Reams Dorshimer, am acting as scribe for Dr. Nicholas Lose.  I have reviewed the above documentation for accuracy and completeness, and I agree with the above.

## 2020-11-10 ENCOUNTER — Inpatient Hospital Stay: Payer: Federal, State, Local not specified - PPO

## 2020-11-10 ENCOUNTER — Other Ambulatory Visit: Payer: Self-pay | Admitting: Hematology and Oncology

## 2020-11-10 ENCOUNTER — Other Ambulatory Visit: Payer: Federal, State, Local not specified - PPO

## 2020-11-10 ENCOUNTER — Other Ambulatory Visit: Payer: Self-pay

## 2020-11-10 ENCOUNTER — Inpatient Hospital Stay: Payer: Federal, State, Local not specified - PPO | Admitting: Hematology and Oncology

## 2020-11-10 DIAGNOSIS — Z171 Estrogen receptor negative status [ER-]: Secondary | ICD-10-CM

## 2020-11-10 DIAGNOSIS — Z95828 Presence of other vascular implants and grafts: Secondary | ICD-10-CM

## 2020-11-10 DIAGNOSIS — C50412 Malignant neoplasm of upper-outer quadrant of left female breast: Secondary | ICD-10-CM | POA: Diagnosis not present

## 2020-11-10 DIAGNOSIS — Z5112 Encounter for antineoplastic immunotherapy: Secondary | ICD-10-CM | POA: Diagnosis not present

## 2020-11-10 LAB — CBC WITH DIFFERENTIAL (CANCER CENTER ONLY)
Abs Immature Granulocytes: 0.04 10*3/uL (ref 0.00–0.07)
Basophils Absolute: 0.1 10*3/uL (ref 0.0–0.1)
Basophils Relative: 1 %
Eosinophils Absolute: 0 10*3/uL (ref 0.0–0.5)
Eosinophils Relative: 0 %
HCT: 31.6 % — ABNORMAL LOW (ref 36.0–46.0)
Hemoglobin: 10.3 g/dL — ABNORMAL LOW (ref 12.0–15.0)
Immature Granulocytes: 1 %
Lymphocytes Relative: 10 %
Lymphs Abs: 0.8 10*3/uL (ref 0.7–4.0)
MCH: 28.3 pg (ref 26.0–34.0)
MCHC: 32.6 g/dL (ref 30.0–36.0)
MCV: 86.8 fL (ref 80.0–100.0)
Monocytes Absolute: 1.1 10*3/uL — ABNORMAL HIGH (ref 0.1–1.0)
Monocytes Relative: 13 %
Neutro Abs: 6.4 10*3/uL (ref 1.7–7.7)
Neutrophils Relative %: 75 %
Platelet Count: 288 10*3/uL (ref 150–400)
RBC: 3.64 MIL/uL — ABNORMAL LOW (ref 3.87–5.11)
RDW: 17.9 % — ABNORMAL HIGH (ref 11.5–15.5)
WBC Count: 8.5 10*3/uL (ref 4.0–10.5)
nRBC: 0 % (ref 0.0–0.2)

## 2020-11-10 LAB — CMP (CANCER CENTER ONLY)
ALT: 11 U/L (ref 0–44)
AST: 16 U/L (ref 15–41)
Albumin: 3.4 g/dL — ABNORMAL LOW (ref 3.5–5.0)
Alkaline Phosphatase: 76 U/L (ref 38–126)
Anion gap: 6 (ref 5–15)
BUN: 14 mg/dL (ref 6–20)
CO2: 30 mmol/L (ref 22–32)
Calcium: 9.8 mg/dL (ref 8.9–10.3)
Chloride: 105 mmol/L (ref 98–111)
Creatinine: 0.78 mg/dL (ref 0.44–1.00)
GFR, Estimated: >60 mL/min (ref >60)
Glucose, Bld: 99 mg/dL (ref 70–99)
Potassium: 3.6 mmol/L (ref 3.5–5.1)
Sodium: 141 mmol/L (ref 135–145)
Total Bilirubin: 0.3 mg/dL (ref 0.3–1.2)
Total Protein: 7.7 g/dL (ref 6.5–8.1)

## 2020-11-10 LAB — TSH: TSH: 3.98 u[IU]/mL — ABNORMAL HIGH (ref 0.308–3.960)

## 2020-11-10 MED ORDER — SODIUM CHLORIDE 0.9 % IV SOLN
200.0000 mg | Freq: Once | INTRAVENOUS | Status: AC
  Administered 2020-11-10: 200 mg via INTRAVENOUS
  Filled 2020-11-10: qty 8

## 2020-11-10 MED ORDER — HEPARIN SOD (PORK) LOCK FLUSH 100 UNIT/ML IV SOLN
500.0000 [IU] | Freq: Once | INTRAVENOUS | Status: AC | PRN
  Administered 2020-11-10: 500 [IU]
  Filled 2020-11-10: qty 5

## 2020-11-10 MED ORDER — DOXORUBICIN HCL CHEMO IV INJECTION 2 MG/ML
60.0000 mg/m2 | Freq: Once | INTRAVENOUS | Status: AC
  Administered 2020-11-10: 126 mg via INTRAVENOUS
  Filled 2020-11-10: qty 63

## 2020-11-10 MED ORDER — SODIUM CHLORIDE 0.9 % IV SOLN
10.0000 mg | Freq: Once | INTRAVENOUS | Status: AC
  Administered 2020-11-10: 10 mg via INTRAVENOUS
  Filled 2020-11-10: qty 10

## 2020-11-10 MED ORDER — PALONOSETRON HCL INJECTION 0.25 MG/5ML
0.2500 mg | Freq: Once | INTRAVENOUS | Status: AC
Start: 2020-11-10 — End: 2020-11-10
  Administered 2020-11-10: 0.25 mg via INTRAVENOUS

## 2020-11-10 MED ORDER — SODIUM CHLORIDE 0.9 % IV SOLN
600.0000 mg/m2 | Freq: Once | INTRAVENOUS | Status: AC
  Administered 2020-11-10: 1260 mg via INTRAVENOUS
  Filled 2020-11-10: qty 63

## 2020-11-10 MED ORDER — SODIUM CHLORIDE 0.9% FLUSH
10.0000 mL | Freq: Once | INTRAVENOUS | Status: AC
Start: 2020-11-10 — End: 2020-11-10
  Administered 2020-11-10: 10 mL
  Filled 2020-11-10: qty 10

## 2020-11-10 MED ORDER — PALONOSETRON HCL INJECTION 0.25 MG/5ML
INTRAVENOUS | Status: AC
  Filled 2020-11-10: qty 5

## 2020-11-10 MED ORDER — SODIUM CHLORIDE 0.9% FLUSH
10.0000 mL | INTRAVENOUS | Status: DC | PRN
  Administered 2020-11-10: 10 mL
  Filled 2020-11-10: qty 10

## 2020-11-10 MED ORDER — SODIUM CHLORIDE 0.9 % IV SOLN
Freq: Once | INTRAVENOUS | Status: AC
  Filled 2020-11-10: qty 250

## 2020-11-10 MED ORDER — SODIUM CHLORIDE 0.9 % IV SOLN
150.0000 mg | Freq: Once | INTRAVENOUS | Status: AC
  Administered 2020-11-10: 150 mg via INTRAVENOUS
  Filled 2020-11-10: qty 150

## 2020-11-10 NOTE — Assessment & Plan Note (Signed)
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 Neoadjuvant chemotherapy with Adriamycin and Cytoxan dose dense 4 followed by Taxol weekly 12 with carboplatin every 3 weeks x4(pembrolizumab will be added if she is triple negative) 2. Followed bybreast conserving surgery versusmastectomy andaxillary lymph node dissection (due to matted axillary lymph nodes) 3. Followed by adjuvant radiation therapy PET CT scan: Known breast cancer and lymphadenopathy in the axilla, supraclavicular, pectoral, internal mammary lymph nodes, no distant metastatic disease -------------------------------------------------------------------------- Current treatment:Cycle 2 neoadjuvant Adriamycin Cytoxan and Pembrolizumab Toxicities: Denies any nausea or vomiting. Fatigue Leukopenia: Expected for day 8 ANC today is 1.2 Chemotherapy-induced anemia today's hemoglobin 10.2  RTC in 3 weeks for cycle 3

## 2020-11-10 NOTE — Patient Instructions (Signed)
[facility] ONCOLOGY  Discharge Instructions: Thank you for choosing [facility] to provide your oncology and hematology care.   If you have a lab appointment with the [facility], please go directly to the [facility] and check in at the registration area.   Wear comfortable clothing and clothing appropriate for easy access to any Portacath or PICC line.   We strive to give you quality time with your provider. You may need to reschedule your appointment if you arrive late (15 or more minutes).  Arriving late affects you and other patients whose appointments are after yours.  Also, if you miss three or more appointments without notifying the office, you may be dismissed from the clinic at the provider's discretion.      For prescription refill requests, have your pharmacy contact our office and allow 72 hours for refills to be completed.    Today you received the following chemotherapy and/or immunotherapy agents pembroilizumab, doxorubicin, cyclophosphamide.    To help prevent nausea and vomiting after your treatment, we encourage you to take your nausea medication as directed.  BELOW ARE SYMPTOMS THAT SHOULD BE REPORTED IMMEDIATELY: . *FEVER GREATER THAN 100.4 F (38 C) OR HIGHER . *CHILLS OR SWEATING . *NAUSEA AND VOMITING THAT IS NOT CONTROLLED WITH YOUR NAUSEA MEDICATION . *UNUSUAL SHORTNESS OF BREATH . *UNUSUAL BRUISING OR BLEEDING . *URINARY PROBLEMS (pain or burning when urinating, or frequent urination) . *BOWEL PROBLEMS (unusual diarrhea, constipation, pain near the anus) . TENDERNESS IN MOUTH AND THROAT WITH OR WITHOUT PRESENCE OF ULCERS (sore throat, sores in mouth, or a toothache) . UNUSUAL RASH, SWELLING OR PAIN  . UNUSUAL VAGINAL DISCHARGE OR ITCHING   Items with * indicate a potential emergency and should be followed up as soon as possible or go to the Emergency Department if any problems should occur.  Please show the CHEMOTHERAPY  ALERT CARD or IMMUNOTHERAPY ALERT CARD at check-in to the Emergency Department and triage nurse.  Should you have questions after your visit or need to cancel or reschedule your appointment, please contact [facility]  Dept: [phone]  and follow the prompts.  Office hours are 8:00 a.m. to 4:30 p.m. Monday - Friday. Please note that voicemails left after 4:00 p.m. may not be returned until the following business day.  We are closed weekends and major holidays. You have access to a nurse at all times for urgent questions. Please call the main number to the clinic Dept: [phone] and follow the prompts.   For any non-urgent questions, you may also contact your provider using MyChart. We now offer e-Visits for anyone 59 and older to request care online for non-urgent symptoms. For details visit [email or web address].   Also download the MyChart app! Go to the app store, search "MyChart", open the app, select [facility], and log in with your MyChart username and password.  Due to Covid, a mask is required upon entering the hospital/clinic. If you do not have a mask, one will be given to you upon arrival. For doctor visits, patients may have 1 support person aged 26 or older with them. For treatment visits, patients cannot have anyone with them due to current Covid guidelines and our immunocompromised population.

## 2020-11-11 LAB — T4: T4, Total: 10.2 ug/dL (ref 4.5–12.0)

## 2020-11-12 ENCOUNTER — Other Ambulatory Visit: Payer: Self-pay

## 2020-11-12 ENCOUNTER — Inpatient Hospital Stay: Payer: Federal, State, Local not specified - PPO

## 2020-11-12 DIAGNOSIS — Z5112 Encounter for antineoplastic immunotherapy: Secondary | ICD-10-CM | POA: Diagnosis not present

## 2020-11-12 DIAGNOSIS — Z171 Estrogen receptor negative status [ER-]: Secondary | ICD-10-CM

## 2020-11-12 MED ORDER — PEGFILGRASTIM-JMDB 6 MG/0.6ML ~~LOC~~ SOSY
PREFILLED_SYRINGE | SUBCUTANEOUS | Status: AC
  Filled 2020-11-12: qty 0.6

## 2020-11-12 MED ORDER — PEGFILGRASTIM-JMDB 6 MG/0.6ML ~~LOC~~ SOSY
6.0000 mg | PREFILLED_SYRINGE | Freq: Once | SUBCUTANEOUS | Status: AC
  Administered 2020-11-12: 6 mg via SUBCUTANEOUS

## 2020-11-20 ENCOUNTER — Encounter: Payer: Self-pay | Admitting: Hematology and Oncology

## 2020-11-30 NOTE — Progress Notes (Signed)
Patient Care Team: Riki Sheer, NP as PCP - General (Nurse Practitioner) Mauro Kaufmann, RN as Oncology Nurse Navigator Rockwell Germany, RN as Oncology Nurse Navigator Rolm Bookbinder, MD as Consulting Physician (General Surgery) Nicholas Lose, MD as Consulting Physician (Hematology and Oncology) Kyung Rudd, MD as Consulting Physician (Radiation Oncology)  DIAGNOSIS:    ICD-10-CM   1. Malignant neoplasm of upper-outer quadrant of left breast in female, estrogen receptor negative ([place])  C50.412    Z17.1     SUMMARY OF ONCOLOGIC HISTORY: Oncology History  Malignant neoplasm of upper-outer quadrant of left breast in female, estrogen receptor negative ([place])  09/30/2020 Initial Diagnosis   Swollen left breast: Mammogram detected left breast mass UOQ skin thickening with 3 abnormal lymph nodes that are matted and hard, ultrasound 2.7 cm, 2.9 cm, 3 cm and 3 lymph nodes biopsy revealed grade 2-3 IDC with DCIS ER/PR negative HER-2 IHC equivocal FISH pending, Ki-67 60%, lymph node biopsy positive   10/01/2020 Cancer Staging   Staging form: Breast, AJCC 8th Edition - Clinical stage from 10/01/2020: Stage IIIC (cT4b, cN2a, cM0, G3, ER-, PR-, HER2: Equivocal) - Signed by Nicholas Lose, MD on 10/01/2020 Stage prefix: Initial diagnosis Histologic grading system: 3 grade system   10/13/2020 Genetic Testing   Negative hereditary cancer genetic testing: no pathogenic variants detected in [facility] CustomNext-Cancer +RNAinsight Panel.  The report date is October 13, 2020.   The CustomNext-Cancer+RNAinsight panel offered by [facility] includes sequencing and rearrangement analysis for the following 47 genes:  APC, ATM, AXIN2, BARD1, BMPR1A, BRCA1, BRCA2, BRIP1, CDH1, CDK4, CDKN2A, CHEK2, DICER1, EPCAM, GREM1, HOXB13, MEN1, MLH1, MSH2, MSH3, MSH6, MUTYH, NBN, NF1, NF2, NTHL1, PALB2, PMS2, POLD1, POLE, PTEN, RAD51C, RAD51D, RECQL, RET, SDHA, SDHAF2, SDHB, SDHC, SDHD, SMAD4, SMARCA4, STK11, TP53, TSC1,  TSC2, and VHL.  RNA data is routinely analyzed for use in variant interpretation for all genes.   01/12/2021 -  Chemotherapy    Patient is on Treatment Plan: BREAST PEMBROLIZUMAB + AC Q21D X 4 CYCLES / PEMBROLIZUMAB + CARBOPLATIN D1 + PACLITAXEL D1,8,15 Q21D X 4 CYCLES        CHIEF COMPLIANT: Cycle 3Day 1Adriamycin and Cytoxan and Keytruda  INTERVAL HISTORY: Rita Cole is a 60 y.o. with above-mentioned history of left breast cancer currently on neoadjuvant chemotherapy withdose denseAdriamycin and Cytoxan. She presents to the clinic today for treatment.   Overall she is tolerating the treatment extremely well.  She does not have any nausea or vomiting.  She has excellent appetite.  She is excellent energy levels.  ALLERGIES:  has No Known Allergies.  MEDICATIONS:  Current Outpatient Medications  Medication Sig Dispense Refill  . benazepril (LOTENSIN) 40 MG tablet Take 40 mg by mouth daily.    . cyclobenzaprine (FLEXERIL) 10 MG tablet Take 10 mg by mouth 3 (three) times daily as needed.    . hydrochlorothiazide (HYDRODIURIL) 25 MG tablet Take 1 tablet (25 mg total) by mouth daily. 30 tablet 0  . ketorolac (TORADOL) 10 MG tablet Take 1 tablet (10 mg total) by mouth every 6 (six) hours as needed. 20 tablet 0  . lidocaine-prilocaine (EMLA) cream Apply to affected area once 30 g 3  . meloxicam (MOBIC) 15 MG tablet Take 1 tablet (15 mg total) by mouth daily. 10 tablet 0  . ondansetron (ZOFRAN) 8 MG tablet Take 1 tablet (8 mg total) by mouth 2 (two) times daily as needed. Start on the third day after carboplatin and AC chemotherapy. 30 tablet 1  .  prochlorperazine (COMPAZINE) 10 MG tablet Take 1 tablet (10 mg total) by mouth every 6 (six) hours as needed (Nausea or vomiting). 30 tablet 1  . tiZANidine (ZANAFLEX) 4 MG tablet Take 4 mg by mouth 3 (three) times daily as needed.    . Vitamin D, Ergocalciferol, (DRISDOL) 1.25 MG (50000 UNIT) CAPS capsule Take 50,000 Units by mouth once  a week. Monday     No current facility-administered medications for this visit.    PHYSICAL EXAMINATION: ECOG PERFORMANCE STATUS: 1 - Symptomatic but completely ambulatory  Vitals:   12/01/20 0910  BP: (!) 154/95  Pulse: 96  Resp: 18  Temp: 97.6 F (36.4 C)  SpO2: 100%   Filed Weights   12/01/20 0910  Weight: 211 lb (95.7 kg)    LABORATORY DATA:  I have reviewed the data as listed CMP Latest Ref Rng & Units 11/10/2020 10/27/2020 10/20/2020  Glucose 70 - 99 mg/dL 99 90 90  BUN 6 - 20 mg/dL $Remove'14 20 14  'fgdblSy$ Creatinine 0.44 - 1.00 mg/dL 0.78 0.87 0.87  Sodium 135 - 145 mmol/L 141 139 140  Potassium 3.5 - 5.1 mmol/L 3.6 3.6 3.9  Chloride 98 - 111 mmol/L 105 99 100  CO2 22 - 32 mmol/L $RemoveB'30 30 30  'hfpEvIaD$ Calcium 8.9 - 10.3 mg/dL 9.8 9.4 9.6  Total Protein 6.5 - 8.1 g/dL 7.7 7.6 8.4(H)  Total Bilirubin 0.3 - 1.2 mg/dL 0.3 0.5 0.4  Alkaline Phos 38 - 126 U/L 76 87 75  AST 15 - 41 U/L $Remo'16 19 24  'elisb$ ALT 0 - 44 U/L $Remo'11 10 24    'gADGd$ Lab Results  Component Value Date   WBC 5.5 12/01/2020   HGB 10.7 (L) 12/01/2020   HCT 33.1 (L) 12/01/2020   MCV 88.5 12/01/2020   PLT 239 12/01/2020   NEUTROABS 3.6 12/01/2020    ASSESSMENT & PLAN:  Malignant neoplasm of upper-outer quadrant of left breast in female, estrogen receptor negative ([place]) 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 Neoadjuvant chemotherapy with Adriamycin and Cytoxan dose dense 4 followed by Taxol weekly 12 with carboplatin every 3 weeks x4(pembrolizumab will be added if she is triple negative) 2. Followed bybreast conserving surgery versusmastectomy andaxillary lymph node dissection (due to matted axillary lymph nodes) 3. Followed by  adjuvant radiation therapy PET CT scan: Known breast cancer and lymphadenopathy in the axilla, supraclavicular, pectoral, internal mammary lymph nodes, no distant metastatic disease -------------------------------------------------------------------------- Current treatment:Cycle 3neoadjuvant Adriamycin Cytoxan and Pembrolizumab Toxicities: Denies any nausea or vomiting. 1. Fatigue 2.  alopecia 3. Chemotherapy-induced anemia today's hemoglobin 10.7 4.  Runny nose  Monitoring closely for immunotherapy related adverse effects.:  I am awaiting the results of TSH.    RTC in 3 weeks for cycle 4    No orders of the defined types were placed in this encounter.  The patient has a good understanding of the overall plan. she agrees with it. she will call with any problems that may develop before the next visit here.  Total time spent: 30 mins including face to face time and time spent for planning, charting and coordination of care  Rulon Eisenmenger, MD, MPH 12/01/2020  I, Cloyde Reams Dorshimer, am acting as Education administrator for  Dr. Nicholas Lose.  I have reviewed the above documentation for accuracy and completeness, and I agree with the above.

## 2020-11-30 NOTE — Assessment & Plan Note (Signed)
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 Neoadjuvant chemotherapy with Adriamycin and Cytoxan dose dense 4 followed by Taxol weekly 12 with carboplatin every 3 weeks x4(pembrolizumab will be added if she is triple negative) 2. Followed bybreast conserving surgery versusmastectomy andaxillary lymph node dissection (due to matted axillary lymph nodes) 3. Followed by adjuvant radiation therapy PET CT scan: Known breast cancer and lymphadenopathy in the axilla, supraclavicular, pectoral, internal mammary lymph nodes, no distant metastatic disease -------------------------------------------------------------------------- Current treatment:Cycle 3neoadjuvant Adriamycin Cytoxan and Pembrolizumab Toxicities: Denies any nausea or vomiting. 1. Fatigue 2. Leukopenia: Expected for day 8  3. Chemotherapy-induced anemia today's hemoglobin 10.3  Monitoring closely for immunotherapy related adverse effects. Patient thinks that the breast is doing much better since chemo Started.  RTC in 3 weeks for cycle 4

## 2020-12-01 ENCOUNTER — Inpatient Hospital Stay: Payer: Federal, State, Local not specified - PPO

## 2020-12-01 ENCOUNTER — Inpatient Hospital Stay: Payer: Federal, State, Local not specified - PPO | Attending: Hematology and Oncology

## 2020-12-01 ENCOUNTER — Inpatient Hospital Stay: Payer: Federal, State, Local not specified - PPO | Admitting: Hematology and Oncology

## 2020-12-01 ENCOUNTER — Encounter: Payer: Self-pay | Admitting: Hematology and Oncology

## 2020-12-01 ENCOUNTER — Other Ambulatory Visit: Payer: Federal, State, Local not specified - PPO

## 2020-12-01 ENCOUNTER — Other Ambulatory Visit: Payer: Self-pay

## 2020-12-01 DIAGNOSIS — C50412 Malignant neoplasm of upper-outer quadrant of left female breast: Secondary | ICD-10-CM

## 2020-12-01 DIAGNOSIS — L659 Nonscarring hair loss, unspecified: Secondary | ICD-10-CM | POA: Diagnosis not present

## 2020-12-01 DIAGNOSIS — T451X5A Adverse effect of antineoplastic and immunosuppressive drugs, initial encounter: Secondary | ICD-10-CM | POA: Diagnosis not present

## 2020-12-01 DIAGNOSIS — R5383 Other fatigue: Secondary | ICD-10-CM | POA: Diagnosis not present

## 2020-12-01 DIAGNOSIS — Z5189 Encounter for other specified aftercare: Secondary | ICD-10-CM | POA: Diagnosis not present

## 2020-12-01 DIAGNOSIS — R0989 Other specified symptoms and signs involving the circulatory and respiratory systems: Secondary | ICD-10-CM | POA: Diagnosis not present

## 2020-12-01 DIAGNOSIS — Z79899 Other long term (current) drug therapy: Secondary | ICD-10-CM | POA: Diagnosis not present

## 2020-12-01 DIAGNOSIS — Z5111 Encounter for antineoplastic chemotherapy: Secondary | ICD-10-CM | POA: Insufficient documentation

## 2020-12-01 DIAGNOSIS — Z95828 Presence of other vascular implants and grafts: Secondary | ICD-10-CM

## 2020-12-01 DIAGNOSIS — D6481 Anemia due to antineoplastic chemotherapy: Secondary | ICD-10-CM | POA: Diagnosis not present

## 2020-12-01 DIAGNOSIS — Z171 Estrogen receptor negative status [ER-]: Secondary | ICD-10-CM | POA: Insufficient documentation

## 2020-12-01 DIAGNOSIS — Z5112 Encounter for antineoplastic immunotherapy: Secondary | ICD-10-CM | POA: Insufficient documentation

## 2020-12-01 LAB — CBC WITH DIFFERENTIAL (CANCER CENTER ONLY)
Abs Immature Granulocytes: 0.03 10*3/uL (ref 0.00–0.07)
Basophils Absolute: 0.1 10*3/uL (ref 0.0–0.1)
Basophils Relative: 2 %
Eosinophils Absolute: 0 10*3/uL (ref 0.0–0.5)
Eosinophils Relative: 0 %
HCT: 33.1 % — ABNORMAL LOW (ref 36.0–46.0)
Hemoglobin: 10.7 g/dL — ABNORMAL LOW (ref 12.0–15.0)
Immature Granulocytes: 1 %
Lymphocytes Relative: 12 %
Lymphs Abs: 0.7 10*3/uL (ref 0.7–4.0)
MCH: 28.6 pg (ref 26.0–34.0)
MCHC: 32.3 g/dL (ref 30.0–36.0)
MCV: 88.5 fL (ref 80.0–100.0)
Monocytes Absolute: 1.1 10*3/uL — ABNORMAL HIGH (ref 0.1–1.0)
Monocytes Relative: 20 %
Neutro Abs: 3.6 10*3/uL (ref 1.7–7.7)
Neutrophils Relative %: 65 %
Platelet Count: 239 10*3/uL (ref 150–400)
RBC: 3.74 MIL/uL — ABNORMAL LOW (ref 3.87–5.11)
RDW: 21.2 % — ABNORMAL HIGH (ref 11.5–15.5)
WBC Count: 5.5 10*3/uL (ref 4.0–10.5)
nRBC: 0 % (ref 0.0–0.2)

## 2020-12-01 LAB — CMP (CANCER CENTER ONLY)
ALT: 25 U/L (ref 0–44)
AST: 25 U/L (ref 15–41)
Albumin: 3.6 g/dL (ref 3.5–5.0)
Alkaline Phosphatase: 77 U/L (ref 38–126)
Anion gap: 11 (ref 5–15)
BUN: 14 mg/dL (ref 6–20)
CO2: 28 mmol/L (ref 22–32)
Calcium: 9.5 mg/dL (ref 8.9–10.3)
Chloride: 104 mmol/L (ref 98–111)
Creatinine: 0.74 mg/dL (ref 0.44–1.00)
GFR, Estimated: >60 mL/min (ref >60)
Glucose, Bld: 109 mg/dL — ABNORMAL HIGH (ref 70–99)
Potassium: 3.6 mmol/L (ref 3.5–5.1)
Sodium: 143 mmol/L (ref 135–145)
Total Bilirubin: 0.2 mg/dL — ABNORMAL LOW (ref 0.3–1.2)
Total Protein: 7.1 g/dL (ref 6.5–8.1)

## 2020-12-01 LAB — TSH: TSH: 4.803 u[IU]/mL — ABNORMAL HIGH (ref 0.308–3.960)

## 2020-12-01 MED ORDER — DOXORUBICIN HCL CHEMO IV INJECTION 2 MG/ML
60.0000 mg/m2 | Freq: Once | INTRAVENOUS | Status: AC
  Administered 2020-12-01: 126 mg via INTRAVENOUS
  Filled 2020-12-01: qty 63

## 2020-12-01 MED ORDER — PALONOSETRON HCL INJECTION 0.25 MG/5ML
0.2500 mg | Freq: Once | INTRAVENOUS | Status: AC
  Administered 2020-12-01: 0.25 mg via INTRAVENOUS

## 2020-12-01 MED ORDER — SODIUM CHLORIDE 0.9% FLUSH
10.0000 mL | Freq: Once | INTRAVENOUS | Status: AC
  Administered 2020-12-01: 10 mL
  Filled 2020-12-01: qty 10

## 2020-12-01 MED ORDER — SODIUM CHLORIDE 0.9 % IV SOLN
200.0000 mg | Freq: Once | INTRAVENOUS | Status: AC
  Administered 2020-12-01: 200 mg via INTRAVENOUS
  Filled 2020-12-01: qty 8

## 2020-12-01 MED ORDER — FOSAPREPITANT DIMEGLUMINE INJECTION 150 MG
150.0000 mg | Freq: Once | INTRAVENOUS | Status: AC
  Administered 2020-12-01: 150 mg via INTRAVENOUS
  Filled 2020-12-01: qty 150

## 2020-12-01 MED ORDER — SODIUM CHLORIDE 0.9 % IV SOLN
Freq: Once | INTRAVENOUS | Status: AC
  Filled 2020-12-01: qty 250

## 2020-12-01 MED ORDER — HEPARIN SOD (PORK) LOCK FLUSH 100 UNIT/ML IV SOLN
500.0000 [IU] | Freq: Once | INTRAVENOUS | Status: AC | PRN
  Administered 2020-12-01: 500 [IU]
  Filled 2020-12-01: qty 5

## 2020-12-01 MED ORDER — SODIUM CHLORIDE 0.9 % IV SOLN
600.0000 mg/m2 | Freq: Once | INTRAVENOUS | Status: AC
  Administered 2020-12-01: 1260 mg via INTRAVENOUS
  Filled 2020-12-01: qty 63

## 2020-12-01 MED ORDER — SODIUM CHLORIDE 0.9% FLUSH
10.0000 mL | INTRAVENOUS | Status: DC | PRN
Start: 2020-12-01 — End: 2020-12-01
  Administered 2020-12-01: 10 mL
  Filled 2020-12-01: qty 10

## 2020-12-01 MED ORDER — PALONOSETRON HCL INJECTION 0.25 MG/5ML
INTRAVENOUS | Status: AC
  Filled 2020-12-01: qty 5

## 2020-12-01 MED ORDER — SODIUM CHLORIDE 0.9 % IV SOLN
10.0000 mg | Freq: Once | INTRAVENOUS | Status: AC
  Administered 2020-12-01: 10 mg via INTRAVENOUS
  Filled 2020-12-01: qty 10

## 2020-12-01 NOTE — Patient Instructions (Signed)
[facility] ONCOLOGY  Discharge Instructions: Thank you for choosing [facility] to provide your oncology and hematology care.   If you have a lab appointment with the [facility], please go directly to the [facility] and check in at the registration area.   Wear comfortable clothing and clothing appropriate for easy access to any Portacath or PICC line.   We strive to give you quality time with your provider. You may need to reschedule your appointment if you arrive late (15 or more minutes).  Arriving late affects you and other patients whose appointments are after yours.  Also, if you miss three or more appointments without notifying the office, you may be dismissed from the clinic at the provider's discretion.      For prescription refill requests, have your pharmacy contact our office and allow 72 hours for refills to be completed.    Today you received the following chemotherapy and/or immunotherapy agents pembroilizumab, doxorubicin, cyclophosphamide.    To help prevent nausea and vomiting after your treatment, we encourage you to take your nausea medication as directed.  BELOW ARE SYMPTOMS THAT SHOULD BE REPORTED IMMEDIATELY: . *FEVER GREATER THAN 100.4 F (38 C) OR HIGHER . *CHILLS OR SWEATING . *NAUSEA AND VOMITING THAT IS NOT CONTROLLED WITH YOUR NAUSEA MEDICATION . *UNUSUAL SHORTNESS OF BREATH . *UNUSUAL BRUISING OR BLEEDING . *URINARY PROBLEMS (pain or burning when urinating, or frequent urination) . *BOWEL PROBLEMS (unusual diarrhea, constipation, pain near the anus) . TENDERNESS IN MOUTH AND THROAT WITH OR WITHOUT PRESENCE OF ULCERS (sore throat, sores in mouth, or a toothache) . UNUSUAL RASH, SWELLING OR PAIN  . UNUSUAL VAGINAL DISCHARGE OR ITCHING   Items with * indicate a potential emergency and should be followed up as soon as possible or go to the Emergency Department if any problems should occur.  Please show the CHEMOTHERAPY  ALERT CARD or IMMUNOTHERAPY ALERT CARD at check-in to the Emergency Department and triage nurse.  Should you have questions after your visit or need to cancel or reschedule your appointment, please contact [facility]  Dept: [phone]  and follow the prompts.  Office hours are 8:00 a.m. to 4:30 p.m. Monday - Friday. Please note that voicemails left after 4:00 p.m. may not be returned until the following business day.  We are closed weekends and major holidays. You have access to a nurse at all times for urgent questions. Please call the main number to the clinic Dept: [phone] and follow the prompts.   For any non-urgent questions, you may also contact your provider using MyChart. We now offer e-Visits for anyone 59 and older to request care online for non-urgent symptoms. For details visit [email or web address].   Also download the MyChart app! Go to the app store, search "MyChart", open the app, select [facility], and log in with your MyChart username and password.  Due to Covid, a mask is required upon entering the hospital/clinic. If you do not have a mask, one will be given to you upon arrival. For doctor visits, patients may have 1 support person aged 26 or older with them. For treatment visits, patients cannot have anyone with them due to current Covid guidelines and our immunocompromised population.

## 2020-12-02 LAB — T4: T4, Total: 8.7 ug/dL (ref 4.5–12.0)

## 2020-12-03 ENCOUNTER — Other Ambulatory Visit: Payer: Self-pay

## 2020-12-03 ENCOUNTER — Inpatient Hospital Stay: Payer: Federal, State, Local not specified - PPO

## 2020-12-03 VITALS — BP 151/82 | HR 81 | Temp 97.8°F | Resp 18

## 2020-12-03 DIAGNOSIS — Z171 Estrogen receptor negative status [ER-]: Secondary | ICD-10-CM

## 2020-12-03 DIAGNOSIS — C50412 Malignant neoplasm of upper-outer quadrant of left female breast: Secondary | ICD-10-CM | POA: Diagnosis not present

## 2020-12-03 MED ORDER — PEGFILGRASTIM-JMDB 6 MG/0.6ML ~~LOC~~ SOSY
6.0000 mg | PREFILLED_SYRINGE | Freq: Once | SUBCUTANEOUS | Status: AC
  Administered 2020-12-03: 6 mg via SUBCUTANEOUS

## 2020-12-03 MED ORDER — PEGFILGRASTIM-JMDB 6 MG/0.6ML ~~LOC~~ SOSY
PREFILLED_SYRINGE | SUBCUTANEOUS | Status: AC
  Filled 2020-12-03: qty 0.6

## 2020-12-03 NOTE — Patient Instructions (Signed)
Pegfilgrastim injection What is this medicine? PEGFILGRASTIM (PEG fil gra stim) is a long-acting granulocyte colony-stimulating factor that stimulates the growth of neutrophils, a type of white blood cell important in the body's fight against infection. It is used to reduce the incidence of fever and infection in patients with certain types of cancer who are receiving chemotherapy that affects the bone marrow, and to increase survival after being exposed to high doses of radiation. This medicine may be used for other purposes; ask your health care provider or pharmacist if you have questions. COMMON BRAND NAME(S): Fulphila, Neulasta, Nyvepria, UDENYCA, Ziextenzo What should I tell my health care provider before I take this medicine? They need to know if you have any of these conditions:  kidney disease  latex allergy  ongoing radiation therapy  sickle cell disease  skin reactions to acrylic adhesives (On-Body Injector only)  an unusual or allergic reaction to pegfilgrastim, filgrastim, other medicines, foods, dyes, or preservatives  pregnant or trying to get pregnant  breast-feeding How should I use this medicine? This medicine is for injection under the skin. If you get this medicine at home, you will be taught how to prepare and give the pre-filled syringe or how to use the On-body Injector. Refer to the patient Instructions for Use for detailed instructions. Use exactly as directed. Tell your healthcare provider immediately if you suspect that the On-body Injector may not have performed as intended or if you suspect the use of the On-body Injector resulted in a missed or partial dose. It is important that you put your used needles and syringes in a special sharps container. Do not put them in a trash can. If you do not have a sharps container, call your pharmacist or healthcare provider to get one. Talk to your pediatrician regarding the use of this medicine in children. While this drug  may be prescribed for selected conditions, precautions do apply. Overdosage: If you think you have taken too much of this medicine contact a poison control center or emergency room at once. NOTE: This medicine is only for you. Do not share this medicine with others. What if I miss a dose? It is important not to miss your dose. Call your doctor or health care professional if you miss your dose. If you miss a dose due to an On-body Injector failure or leakage, a new dose should be administered as soon as possible using a single prefilled syringe for manual use. What may interact with this medicine? Interactions have not been studied. This list may not describe all possible interactions. Give your health care provider a list of all the medicines, herbs, non-prescription drugs, or dietary supplements you use. Also tell them if you smoke, drink alcohol, or use illegal drugs. Some items may interact with your medicine. What should I watch for while using this medicine? Your condition will be monitored carefully while you are receiving this medicine. You may need blood work done while you are taking this medicine. Talk to your health care provider about your risk of cancer. You may be more at risk for certain types of cancer if you take this medicine. If you are going to need a MRI, CT scan, or other procedure, tell your doctor that you are using this medicine (On-Body Injector only). What side effects may I notice from receiving this medicine? Side effects that you should report to your doctor or health care professional as soon as possible:  allergic reactions (skin rash, itching or hives, swelling of   the face, lips, or tongue)  back pain  dizziness  fever  pain, redness, or irritation at site where injected  pinpoint red spots on the skin  red or dark-brown urine  shortness of breath or breathing problems  stomach or side pain, or pain at the shoulder  swelling  tiredness  trouble  passing urine or change in the amount of urine  unusual bruising or bleeding Side effects that usually do not require medical attention (report to your doctor or health care professional if they continue or are bothersome):  bone pain  muscle pain This list may not describe all possible side effects. Call your doctor for medical advice about side effects. You may report side effects to FDA at 1-800-FDA-1088. Where should I keep my medicine? Keep out of the reach of children. If you are using this medicine at home, you will be instructed on how to store it. Throw away any unused medicine after the expiration date on the label. NOTE: This sheet is a summary. It may not cover all possible information. If you have questions about this medicine, talk to your doctor, pharmacist, or health care provider.  2021 Elsevier/Gold Standard (2019-07-06 13:20:51)  

## 2020-12-18 ENCOUNTER — Encounter: Payer: Self-pay | Admitting: *Deleted

## 2020-12-21 NOTE — Progress Notes (Signed)
Patient Care Team: Riki Sheer, NP as PCP - General (Nurse Practitioner) Mauro Kaufmann, RN as Oncology Nurse Navigator Rockwell Germany, RN as Oncology Nurse Navigator Rolm Bookbinder, MD as Consulting Physician (General Surgery) Nicholas Lose, MD as Consulting Physician (Hematology and Oncology) Kyung Rudd, MD as Consulting Physician (Radiation Oncology)  DIAGNOSIS:    ICD-10-CM   1. Malignant neoplasm of upper-outer quadrant of left breast in female, estrogen receptor negative ([place])  C50.412    Z17.1       SUMMARY OF ONCOLOGIC HISTORY: Oncology History  Malignant neoplasm of upper-outer quadrant of left breast in female, estrogen receptor negative ([place])  09/30/2020 Initial Diagnosis   Swollen left breast: Mammogram detected left breast mass UOQ skin thickening with 3 abnormal lymph nodes that are matted and hard, ultrasound 2.7 cm, 2.9 cm, 3 cm and 3 lymph nodes biopsy revealed grade 2-3 IDC with DCIS ER/PR negative HER-2 IHC equivocal FISH pending, Ki-67 60%, lymph node biopsy positive   10/01/2020 Cancer Staging   Staging form: Breast, AJCC 8th Edition - Clinical stage from 10/01/2020: Stage IIIC (cT4b, cN2a, cM0, G3, ER-, PR-, HER2: Equivocal) - Signed by Nicholas Lose, MD on 10/01/2020  Stage prefix: Initial diagnosis  Histologic grading system: 3 grade system    10/13/2020 Genetic Testing   Negative hereditary cancer genetic testing: no pathogenic variants detected in [facility] CustomNext-Cancer +RNAinsight Panel.  The report date is October 13, 2020.   The CustomNext-Cancer+RNAinsight panel offered by [facility] includes sequencing and rearrangement analysis for the following 47 genes:  APC, ATM, AXIN2, BARD1, BMPR1A, BRCA1, BRCA2, BRIP1, CDH1, CDK4, CDKN2A, CHEK2, DICER1, EPCAM, GREM1, HOXB13, MEN1, MLH1, MSH2, MSH3, MSH6, MUTYH, NBN, NF1, NF2, NTHL1, PALB2, PMS2, POLD1, POLE, PTEN, RAD51C, RAD51D, RECQL, RET, SDHA, SDHAF2, SDHB, SDHC, SDHD, SMAD4, SMARCA4, STK11,  TP53, TSC1, TSC2, and VHL.  RNA data is routinely analyzed for use in variant interpretation for all genes.   01/12/2021 -  Chemotherapy    Patient is on Treatment Plan: BREAST PEMBROLIZUMAB + AC Q21D X 4 CYCLES / PEMBROLIZUMAB + CARBOPLATIN D1 + PACLITAXEL D1,8,15 Q21D X 4 CYCLES         CHIEF COMPLIANT: Cycle 4 Day 1 Adriamycin and Cytoxan and Keytruda  INTERVAL HISTORY: Rita Cole is a 60 y.o. with above-mentioned history of left breast cancer currently on neoadjuvant chemotherapy with dose dense Adriamycin and Cytoxan. She presents to the clinic today for treatment.  She is tolerated cycle 3 of Adriamycin and Cytoxan extremely well without any problems or concerns.  Denies any nausea or vomiting.  ALLERGIES:  has No Known Allergies.  MEDICATIONS:  Current Outpatient Medications  Medication Sig Dispense Refill   benazepril (LOTENSIN) 40 MG tablet Take 40 mg by mouth daily.     cyclobenzaprine (FLEXERIL) 10 MG tablet Take 10 mg by mouth 3 (three) times daily as needed.     hydrochlorothiazide (HYDRODIURIL) 25 MG tablet Take 1 tablet (25 mg total) by mouth daily. 30 tablet 0   ketorolac (TORADOL) 10 MG tablet Take 1 tablet (10 mg total) by mouth every 6 (six) hours as needed. 20 tablet 0   lidocaine-prilocaine (EMLA) cream Apply to affected area once 30 g 3   meloxicam (MOBIC) 15 MG tablet Take 1 tablet (15 mg total) by mouth daily. 10 tablet 0   ondansetron (ZOFRAN) 8 MG tablet Take 1 tablet (8 mg total) by mouth 2 (two) times daily as needed. Start on the third day after carboplatin and AC chemotherapy.  30 tablet 1   prochlorperazine (COMPAZINE) 10 MG tablet Take 1 tablet (10 mg total) by mouth every 6 (six) hours as needed (Nausea or vomiting). 30 tablet 1   tiZANidine (ZANAFLEX) 4 MG tablet Take 4 mg by mouth 3 (three) times daily as needed.     Vitamin D, Ergocalciferol, (DRISDOL) 1.25 MG (50000 UNIT) CAPS capsule Take 50,000 Units by mouth once a week. Monday     No  current facility-administered medications for this visit.    PHYSICAL EXAMINATION: ECOG PERFORMANCE STATUS: 1 - Symptomatic but completely ambulatory  Vitals:   12/22/20 1152  BP: (!) 154/81  Pulse: 93  Resp: 18  Temp: 97.6 F (36.4 C)  SpO2: 100%   Filed Weights   12/22/20 1152  Weight: 210 lb (95.3 kg)     LABORATORY DATA:  I have reviewed the data as listed CMP Latest Ref Rng & Units 12/01/2020 11/10/2020 10/27/2020  Glucose 70 - 99 mg/dL 109(H) 99 90  BUN 6 - 20 mg/dL _0 Creatinine 0.44 - 1.00 mg/dL 0.74 0.78 0.87  Sodium 135 - 145 mmol/L 143 141 139  Potassium 3.5 - 5.1 mmol/L 3.6 3.6 3.6  Chloride 98 - 111 mmol/L 104 105 99  CO2 22 - 32 mmol/L _1 Calcium 8.9 - 10.3 mg/dL 9.5 9.8 9.4  Total Protein 6.5 - 8.1 g/dL 7.1 7.7 7.6  Total Bilirubin 0.3 - 1.2 mg/dL 0.2(L) 0.3 0.5  Alkaline Phos 38 - 126 U/L 77 76 87  AST 15 - 41 U/L _2 ALT 0 - 44 U/L _3 Lab Results  Component Value Date   WBC 6.5 12/22/2020   HGB 10.7 (L) 12/22/2020   HCT 32.6 (L) 12/22/2020   MCV 90.8 12/22/2020   PLT 225 12/22/2020   NEUTROABS 4.5 12/22/2020    ASSESSMENT & PLAN:  Malignant neoplasm of upper-outer quadrant of left breast in female, estrogen receptor negative ([place]) 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 Neoadjuvant chemotherapy with Adriamycin and Cytoxan dose dense 4 followed by Taxol weekly 12 with carboplatin every 3 weeks x4 (pembrolizumab will be added if she is triple negative) 2. Followed by breast conserving surgery versus mastectomy and axillary lymph node dissection (due to matted axillary lymph nodes) 3. Followed by adjuvant radiation  therapy PET CT scan: Known breast cancer and lymphadenopathy in the axilla, supraclavicular, pectoral, internal mammary lymph nodes, no distant metastatic disease -------------------------------------------------------------------------- Current treatment: Cycle 4 neoadjuvant Adriamycin Cytoxan and Pembrolizumab Toxicities: Denies any nausea or vomiting. 1. Fatigue 2.  alopecia 3. Chemotherapy-induced anemia today's hemoglobin 10.7 4.  Runny nose   Monitoring closely for immunotherapy related adverse effects.:  monitoring TSH.     RTC in 3 weeks for cycle 1 Taxol, carboplatin and Keytruda    No orders of the defined types were placed in this encounter.  The patient has a good understanding of the overall plan. she agrees with it. she will call with any problems that may develop before the next visit here.  Total time spent: 30 mins including face to face time and time spent for planning, charting and coordination of care  Joud Ingwersen  Loyal Gambler, MD, MPH 12/22/2020  I, Thana Ates, am acting as scribe for Dr. Nicholas Lose.  I have reviewed the above documentation for accuracy and completeness, and I agree with the above.

## 2020-12-22 ENCOUNTER — Inpatient Hospital Stay: Payer: Federal, State, Local not specified - PPO | Admitting: Hematology and Oncology

## 2020-12-22 ENCOUNTER — Inpatient Hospital Stay: Payer: Federal, State, Local not specified - PPO

## 2020-12-22 ENCOUNTER — Other Ambulatory Visit: Payer: Self-pay | Admitting: *Deleted

## 2020-12-22 ENCOUNTER — Other Ambulatory Visit: Payer: Federal, State, Local not specified - PPO

## 2020-12-22 ENCOUNTER — Other Ambulatory Visit: Payer: Self-pay

## 2020-12-22 DIAGNOSIS — C50412 Malignant neoplasm of upper-outer quadrant of left female breast: Secondary | ICD-10-CM

## 2020-12-22 DIAGNOSIS — Z171 Estrogen receptor negative status [ER-]: Secondary | ICD-10-CM | POA: Diagnosis not present

## 2020-12-22 DIAGNOSIS — Z79899 Other long term (current) drug therapy: Secondary | ICD-10-CM

## 2020-12-22 LAB — CBC WITH DIFFERENTIAL (CANCER CENTER ONLY)
Abs Immature Granulocytes: 0.02 10*3/uL (ref 0.00–0.07)
Basophils Absolute: 0.1 10*3/uL (ref 0.0–0.1)
Basophils Relative: 1 %
Eosinophils Absolute: 0 10*3/uL (ref 0.0–0.5)
Eosinophils Relative: 1 %
HCT: 32.6 % — ABNORMAL LOW (ref 36.0–46.0)
Hemoglobin: 10.7 g/dL — ABNORMAL LOW (ref 12.0–15.0)
Immature Granulocytes: 0 %
Lymphocytes Relative: 10 %
Lymphs Abs: 0.6 10*3/uL — ABNORMAL LOW (ref 0.7–4.0)
MCH: 29.8 pg (ref 26.0–34.0)
MCHC: 32.8 g/dL (ref 30.0–36.0)
MCV: 90.8 fL (ref 80.0–100.0)
Monocytes Absolute: 1.3 10*3/uL — ABNORMAL HIGH (ref 0.1–1.0)
Monocytes Relative: 20 %
Neutro Abs: 4.5 10*3/uL (ref 1.7–7.7)
Neutrophils Relative %: 68 %
Platelet Count: 225 10*3/uL (ref 150–400)
RBC: 3.59 MIL/uL — ABNORMAL LOW (ref 3.87–5.11)
RDW: 22 % — ABNORMAL HIGH (ref 11.5–15.5)
WBC Count: 6.5 10*3/uL (ref 4.0–10.5)
nRBC: 0 % (ref 0.0–0.2)

## 2020-12-22 LAB — CMP (CANCER CENTER ONLY)
ALT: 18 U/L (ref 0–44)
AST: 22 U/L (ref 15–41)
Albumin: 3.6 g/dL (ref 3.5–5.0)
Alkaline Phosphatase: 77 U/L (ref 38–126)
Anion gap: 10 (ref 5–15)
BUN: 14 mg/dL (ref 6–20)
CO2: 28 mmol/L (ref 22–32)
Calcium: 9.5 mg/dL (ref 8.9–10.3)
Chloride: 104 mmol/L (ref 98–111)
Creatinine: 0.79 mg/dL (ref 0.44–1.00)
GFR, Estimated: >60 mL/min (ref >60)
Glucose, Bld: 92 mg/dL (ref 70–99)
Potassium: 3.6 mmol/L (ref 3.5–5.1)
Sodium: 142 mmol/L (ref 135–145)
Total Bilirubin: 0.4 mg/dL (ref 0.3–1.2)
Total Protein: 7.3 g/dL (ref 6.5–8.1)

## 2020-12-22 LAB — TSH: TSH: 3.081 u[IU]/mL (ref 0.308–3.960)

## 2020-12-22 MED ORDER — PALONOSETRON HCL INJECTION 0.25 MG/5ML
INTRAVENOUS | Status: AC
  Filled 2020-12-22: qty 5

## 2020-12-22 MED ORDER — SODIUM CHLORIDE 0.9 % IV SOLN
600.0000 mg/m2 | Freq: Once | INTRAVENOUS | Status: AC
  Administered 2020-12-22: 1260 mg via INTRAVENOUS
  Filled 2020-12-22: qty 63

## 2020-12-22 MED ORDER — SODIUM CHLORIDE 0.9 % IV SOLN
10.0000 mg | Freq: Once | INTRAVENOUS | Status: AC
  Administered 2020-12-22: 10 mg via INTRAVENOUS
  Filled 2020-12-22: qty 10

## 2020-12-22 MED ORDER — HEPARIN SOD (PORK) LOCK FLUSH 100 UNIT/ML IV SOLN
500.0000 [IU] | Freq: Once | INTRAVENOUS | Status: AC | PRN
  Administered 2020-12-22: 500 [IU]
  Filled 2020-12-22: qty 5

## 2020-12-22 MED ORDER — SODIUM CHLORIDE 0.9 % IV SOLN
Freq: Once | INTRAVENOUS | Status: AC
  Filled 2020-12-22: qty 250

## 2020-12-22 MED ORDER — SODIUM CHLORIDE 0.9% FLUSH
10.0000 mL | INTRAVENOUS | Status: DC | PRN
  Administered 2020-12-22: 10 mL
  Filled 2020-12-22: qty 10

## 2020-12-22 MED ORDER — DOXORUBICIN HCL CHEMO IV INJECTION 2 MG/ML
60.0000 mg/m2 | Freq: Once | INTRAVENOUS | Status: AC
  Administered 2020-12-22: 126 mg via INTRAVENOUS
  Filled 2020-12-22: qty 63

## 2020-12-22 MED ORDER — PALONOSETRON HCL INJECTION 0.25 MG/5ML
0.2500 mg | Freq: Once | INTRAVENOUS | Status: AC
Start: 2020-12-22 — End: 2020-12-22
  Administered 2020-12-22: 0.25 mg via INTRAVENOUS

## 2020-12-22 MED ORDER — SODIUM CHLORIDE 0.9 % IV SOLN
150.0000 mg | Freq: Once | INTRAVENOUS | Status: AC
  Administered 2020-12-22: 150 mg via INTRAVENOUS
  Filled 2020-12-22: qty 150

## 2020-12-22 MED ORDER — SODIUM CHLORIDE 0.9 % IV SOLN
200.0000 mg | Freq: Once | INTRAVENOUS | Status: AC
  Administered 2020-12-22: 200 mg via INTRAVENOUS
  Filled 2020-12-22: qty 8

## 2020-12-22 NOTE — Assessment & Plan Note (Signed)
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 Neoadjuvant chemotherapy with Adriamycin and Cytoxan dose dense 4 followed by Taxol weekly 12 with carboplatin every 3 weeks x4(pembrolizumab will be added if she is triple negative) 2. Followed bybreast conserving surgery versusmastectomy andaxillary lymph node dissection (due to matted axillary lymph nodes) 3. Followed by adjuvant radiation therapy PET CT scan: Known breast cancer and lymphadenopathy in the axilla, supraclavicular, pectoral, internal mammary lymph nodes, no distant metastatic disease -------------------------------------------------------------------------- Current treatment:Cycle4neoadjuvant Adriamycin Cytoxan and Pembrolizumab Toxicities: Denies any nausea or vomiting. 1.Fatigue 2. alopecia 3.Chemotherapy-induced anemia today's hemoglobin 10.7 4.  Runny nose  Monitoring closely for immunotherapy related adverse effects.:  I am awaiting the results of TSH.    RTC in3weeks for cycle 1 Taxol, carboplatin and Keytruda

## 2020-12-22 NOTE — Progress Notes (Signed)
Excellent blood return before, during and after IVP Doxorubicin

## 2020-12-23 LAB — T4: T4, Total: 9.1 ug/dL (ref 4.5–12.0)

## 2020-12-24 ENCOUNTER — Other Ambulatory Visit: Payer: Self-pay

## 2020-12-24 ENCOUNTER — Inpatient Hospital Stay: Payer: Federal, State, Local not specified - PPO

## 2020-12-24 VITALS — BP 149/76 | HR 88 | Temp 98.1°F | Resp 17

## 2020-12-24 DIAGNOSIS — C50412 Malignant neoplasm of upper-outer quadrant of left female breast: Secondary | ICD-10-CM

## 2020-12-24 MED ORDER — PEGFILGRASTIM-JMDB 6 MG/0.6ML ~~LOC~~ SOSY
6.0000 mg | PREFILLED_SYRINGE | Freq: Once | SUBCUTANEOUS | Status: AC
  Administered 2020-12-24: 6 mg via SUBCUTANEOUS

## 2020-12-24 MED ORDER — PEGFILGRASTIM-JMDB 6 MG/0.6ML ~~LOC~~ SOSY
PREFILLED_SYRINGE | SUBCUTANEOUS | Status: AC
  Filled 2020-12-24: qty 0.6

## 2021-01-01 ENCOUNTER — Encounter: Payer: Self-pay | Admitting: *Deleted

## 2021-01-02 ENCOUNTER — Telehealth: Payer: Self-pay | Admitting: Hematology and Oncology

## 2021-01-02 NOTE — Telephone Encounter (Signed)
Scheduled appts per 7/7 sch msg. Pt aware.  

## 2021-01-07 ENCOUNTER — Encounter: Payer: Self-pay | Admitting: *Deleted

## 2021-01-11 NOTE — Progress Notes (Signed)
Patient Care Team: Riki Sheer, NP as PCP - General (Nurse Practitioner) Mauro Kaufmann, RN as Oncology Nurse Navigator Rockwell Germany, RN as Oncology Nurse Navigator Rolm Bookbinder, MD as Consulting Physician (General Surgery) Nicholas Lose, MD as Consulting Physician (Hematology and Oncology) Kyung Rudd, MD as Consulting Physician (Radiation Oncology)  DIAGNOSIS:    ICD-10-CM   1. Malignant neoplasm of upper-outer quadrant of left breast in female, estrogen receptor negative ([place])  C50.412    Z17.1       SUMMARY OF ONCOLOGIC HISTORY: Oncology History  Malignant neoplasm of upper-outer quadrant of left breast in female, estrogen receptor negative ([place])  09/30/2020 Initial Diagnosis   Swollen left breast: Mammogram detected left breast mass UOQ skin thickening with 3 abnormal lymph nodes that are matted and hard, ultrasound 2.7 cm, 2.9 cm, 3 cm and 3 lymph nodes biopsy revealed grade 2-3 IDC with DCIS ER/PR negative HER-2 IHC equivocal FISH pending, Ki-67 60%, lymph node biopsy positive   10/01/2020 Cancer Staging   Staging form: Breast, AJCC 8th Edition - Clinical stage from 10/01/2020: Stage IIIC (cT4b, cN2a, cM0, G3, ER-, PR-, HER2: Equivocal) - Signed by Nicholas Lose, MD on 10/01/2020  Stage prefix: Initial diagnosis  Histologic grading system: 3 grade system    10/13/2020 Genetic Testing   Negative hereditary cancer genetic testing: no pathogenic variants detected in [facility]Next-Cancer +RNAinsight Panel.  The report date is October 13, 2020.   The CustomNext-Cancer+RNAinsight panel offered by [facility] includes sequencing and rearrangement analysis for the following 47 genes:  APC, ATM, AXIN2, BARD1, BMPR1A, BRCA1, BRCA2, BRIP1, CDH1, CDK4, CDKN2A, CHEK2, DICER1, EPCAM, GREM1, HOXB13, MEN1, MLH1, MSH2, MSH3, MSH6, MUTYH, NBN, NF1, NF2, NTHL1, PALB2, PMS2, POLD1, POLE, PTEN, RAD51C, RAD51D, RECQL, RET, SDHA, SDHAF2, SDHB, SDHC, SDHD, SMAD4, SMARCA4, STK11,  TP53, TSC1, TSC2, and VHL.  RNA data is routinely analyzed for use in variant interpretation for all genes.   01/13/2021 -  Chemotherapy    Patient is on Treatment Plan: BREAST PEMBROLIZUMAB + AC Q21D X 4 CYCLES / PEMBROLIZUMAB + CARBOPLATIN D1 + PACLITAXEL D1,8,15 Q21D X 4 CYCLES         CHIEF COMPLIANT: Cycle 1 Taxol carboplatin and Keytruda will start tomorrow 01/13/2021  INTERVAL HISTORY: Rita Cole is a 60 y.o. with above-mentioned history of  left breast cancer currently on neoadjuvant chemotherapy with dose dense Adriamycin and Cytoxan. She presents to the clinic   for cycle 1 Taxol Carboplatin and Keytruda which will be given tomorrow.  She reports no new complaints or concerns.  She does have fatigue, hair loss.  ALLERGIES:  has No Known Allergies.  MEDICATIONS:  Current Outpatient Medications  Medication Sig Dispense Refill   benazepril (LOTENSIN) 40 MG tablet Take 40 mg by mouth daily.     cyclobenzaprine (FLEXERIL) 10 MG tablet Take 10 mg by mouth 3 (three) times daily as needed.     hydrochlorothiazide (HYDRODIURIL) 25 MG tablet Take 1 tablet (25 mg total) by mouth daily. 30 tablet 0   ketorolac (TORADOL) 10 MG tablet Take 1 tablet (10 mg total) by mouth every 6 (six) hours as needed. 20 tablet 0   lidocaine-prilocaine (EMLA) cream Apply to affected area once 30 g 3   meloxicam (MOBIC) 15 MG tablet Take 1 tablet (15 mg total) by mouth daily. 10 tablet 0   ondansetron (ZOFRAN) 8 MG tablet Take 1 tablet (8 mg total) by mouth 2 (two) times daily as needed. Start on the third day  after carboplatin and AC chemotherapy. 30 tablet 1   prochlorperazine (COMPAZINE) 10 MG tablet Take 1 tablet (10 mg total) by mouth every 6 (six) hours as needed (Nausea or vomiting). 30 tablet 1   tiZANidine (ZANAFLEX) 4 MG tablet Take 4 mg by mouth 3 (three) times daily as needed.     Vitamin D, Ergocalciferol, (DRISDOL) 1.25 MG (50000 UNIT) CAPS capsule Take 50,000 Units by mouth once a  week. Monday     No current facility-administered medications for this visit.    PHYSICAL EXAMINATION: ECOG PERFORMANCE STATUS: 1 - Symptomatic but completely ambulatory  Vitals:   01/12/21 0937  BP: (!) 160/90  Pulse: 100  Resp: 19  Temp: 98 F (36.7 C)  SpO2: 100%   Filed Weights   01/12/21 0937  Weight: 212 lb 8 oz (96.4 kg)    LABORATORY DATA:  I have reviewed the data as listed CMP Latest Ref Rng & Units 12/22/2020 12/01/2020 11/10/2020  Glucose 70 - 99 mg/dL 92 109(H) 99  BUN 6 - 20 mg/dL $Remove'14 14 14  'ogskFmM$ Creatinine 0.44 - 1.00 mg/dL 0.79 0.74 0.78  Sodium 135 - 145 mmol/L 142 143 141  Potassium 3.5 - 5.1 mmol/L 3.6 3.6 3.6  Chloride 98 - 111 mmol/L 104 104 105  CO2 22 - 32 mmol/L $RemoveB'28 28 30  'MUzySAXa$ Calcium 8.9 - 10.3 mg/dL 9.5 9.5 9.8  Total Protein 6.5 - 8.1 g/dL 7.3 7.1 7.7  Total Bilirubin 0.3 - 1.2 mg/dL 0.4 0.2(L) 0.3  Alkaline Phos 38 - 126 U/L 77 77 76  AST 15 - 41 U/L $Remo'22 25 16  'UaJII$ ALT 0 - 44 U/L $Remo'18 25 11    'mXuAC$ Lab Results  Component Value Date   WBC 5.6 01/12/2021   HGB 11.3 (L) 01/12/2021   HCT 34.4 (L) 01/12/2021   MCV 93.2 01/12/2021   PLT 222 01/12/2021   NEUTROABS 3.9 01/12/2021    ASSESSMENT & PLAN:  Malignant neoplasm of upper-outer quadrant of left breast in female, estrogen receptor negative ([place]) 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 Neoadjuvant chemotherapy with Adriamycin and Cytoxan dose dense 4 followed by Taxol weekly 12 with carboplatin every 3 weeks x4 (pembrolizumab will be added if she is triple negative) 2. Followed by breast conserving surgery versus mastectomy and axillary lymph node dissection (due to matted axillary lymph nodes) 3. Followed  by adjuvant radiation therapy PET CT scan: Known breast cancer and lymphadenopathy in the axilla, supraclavicular, pectoral, internal mammary lymph nodes, no distant metastatic disease -------------------------------------------------------------------------- Current treatment: Completed 4 cycles of neoadjuvant Adriamycin Cytoxan and Pembrolizumab, today is cycle 1 Taxol, carbo and [place]  Toxicities: Denies any nausea or vomiting. 1. Fatigue 2.  alopecia 3. Chemotherapy-induced anemia today's hemoglobin 10.7 4.  Runny nose   Monitoring closely for immunotherapy related adverse effects.:  monitoring TSH. I recommended adding G-CSF injections prior to big treatment date for Taxol [employer]. She would like to receive her treatments on the same day instead of split appointments.   RTC in 1 week for cycle 2 Taxol      No orders of the defined types were placed in this encounter.  The patient has a good understanding of the  overall plan. she agrees with it. she will call with any problems that may develop before the next visit here.  Total time spent: 30 mins including face to face time and time spent for planning, charting and coordination of care  Rulon Eisenmenger, MD, MPH 01/12/2021  I, Thana Ates, am acting as scribe for Dr. Nicholas Lose.  I have reviewed the above documentation for accuracy and completeness, and I agree with the above.

## 2021-01-12 ENCOUNTER — Encounter: Payer: Self-pay | Admitting: Pharmacist

## 2021-01-12 ENCOUNTER — Inpatient Hospital Stay: Payer: Federal, State, Local not specified - PPO | Attending: Hematology and Oncology

## 2021-01-12 ENCOUNTER — Other Ambulatory Visit: Payer: Self-pay | Admitting: Pharmacist

## 2021-01-12 ENCOUNTER — Other Ambulatory Visit: Payer: Self-pay

## 2021-01-12 ENCOUNTER — Inpatient Hospital Stay (HOSPITAL_BASED_OUTPATIENT_CLINIC_OR_DEPARTMENT_OTHER): Payer: Federal, State, Local not specified - PPO | Admitting: Hematology and Oncology

## 2021-01-12 DIAGNOSIS — C50412 Malignant neoplasm of upper-outer quadrant of left female breast: Secondary | ICD-10-CM | POA: Insufficient documentation

## 2021-01-12 DIAGNOSIS — Z171 Estrogen receptor negative status [ER-]: Secondary | ICD-10-CM | POA: Diagnosis not present

## 2021-01-12 DIAGNOSIS — Z79899 Other long term (current) drug therapy: Secondary | ICD-10-CM | POA: Insufficient documentation

## 2021-01-12 DIAGNOSIS — L659 Nonscarring hair loss, unspecified: Secondary | ICD-10-CM | POA: Diagnosis not present

## 2021-01-12 DIAGNOSIS — Z8249 Family history of ischemic heart disease and other diseases of the circulatory system: Secondary | ICD-10-CM | POA: Insufficient documentation

## 2021-01-12 DIAGNOSIS — R5383 Other fatigue: Secondary | ICD-10-CM | POA: Diagnosis not present

## 2021-01-12 DIAGNOSIS — Z809 Family history of malignant neoplasm, unspecified: Secondary | ICD-10-CM | POA: Diagnosis not present

## 2021-01-12 DIAGNOSIS — Z5112 Encounter for antineoplastic immunotherapy: Secondary | ICD-10-CM | POA: Diagnosis present

## 2021-01-12 DIAGNOSIS — Z801 Family history of malignant neoplasm of trachea, bronchus and lung: Secondary | ICD-10-CM | POA: Diagnosis not present

## 2021-01-12 DIAGNOSIS — Z87891 Personal history of nicotine dependence: Secondary | ICD-10-CM | POA: Diagnosis not present

## 2021-01-12 DIAGNOSIS — Z95828 Presence of other vascular implants and grafts: Secondary | ICD-10-CM

## 2021-01-12 DIAGNOSIS — Z5111 Encounter for antineoplastic chemotherapy: Secondary | ICD-10-CM | POA: Diagnosis present

## 2021-01-12 LAB — CMP (CANCER CENTER ONLY)
ALT: 20 U/L (ref 0–44)
AST: 23 U/L (ref 15–41)
Albumin: 3.7 g/dL (ref 3.5–5.0)
Alkaline Phosphatase: 81 U/L (ref 38–126)
Anion gap: 10 (ref 5–15)
BUN: 11 mg/dL (ref 6–20)
CO2: 29 mmol/L (ref 22–32)
Calcium: 9.9 mg/dL (ref 8.9–10.3)
Chloride: 103 mmol/L (ref 98–111)
Creatinine: 0.77 mg/dL (ref 0.44–1.00)
GFR, Estimated: >60 mL/min (ref >60)
Glucose, Bld: 122 mg/dL — ABNORMAL HIGH (ref 70–99)
Potassium: 3.7 mmol/L (ref 3.5–5.1)
Sodium: 142 mmol/L (ref 135–145)
Total Bilirubin: 0.3 mg/dL (ref 0.3–1.2)
Total Protein: 7.7 g/dL (ref 6.5–8.1)

## 2021-01-12 LAB — CBC WITH DIFFERENTIAL (CANCER CENTER ONLY)
Abs Immature Granulocytes: 0.02 10*3/uL (ref 0.00–0.07)
Basophils Absolute: 0.1 10*3/uL (ref 0.0–0.1)
Basophils Relative: 1 %
Eosinophils Absolute: 0 10*3/uL (ref 0.0–0.5)
Eosinophils Relative: 0 %
HCT: 34.4 % — ABNORMAL LOW (ref 36.0–46.0)
Hemoglobin: 11.3 g/dL — ABNORMAL LOW (ref 12.0–15.0)
Immature Granulocytes: 0 %
Lymphocytes Relative: 10 %
Lymphs Abs: 0.5 10*3/uL — ABNORMAL LOW (ref 0.7–4.0)
MCH: 30.6 pg (ref 26.0–34.0)
MCHC: 32.8 g/dL (ref 30.0–36.0)
MCV: 93.2 fL (ref 80.0–100.0)
Monocytes Absolute: 1.1 10*3/uL — ABNORMAL HIGH (ref 0.1–1.0)
Monocytes Relative: 19 %
Neutro Abs: 3.9 10*3/uL (ref 1.7–7.7)
Neutrophils Relative %: 70 %
Platelet Count: 222 10*3/uL (ref 150–400)
RBC: 3.69 MIL/uL — ABNORMAL LOW (ref 3.87–5.11)
RDW: 20.7 % — ABNORMAL HIGH (ref 11.5–15.5)
WBC Count: 5.6 10*3/uL (ref 4.0–10.5)
nRBC: 0 % (ref 0.0–0.2)

## 2021-01-12 LAB — TSH: TSH: 3.816 u[IU]/mL (ref 0.308–3.960)

## 2021-01-12 MED ORDER — SODIUM CHLORIDE 0.9% FLUSH
10.0000 mL | Freq: Once | INTRAVENOUS | Status: AC
  Administered 2021-01-12: 10 mL
  Filled 2021-01-12: qty 10

## 2021-01-12 MED ORDER — HEPARIN SOD (PORK) LOCK FLUSH 100 UNIT/ML IV SOLN
500.0000 [IU] | Freq: Once | INTRAVENOUS | Status: AC
  Administered 2021-01-12: 500 [IU]
  Filled 2021-01-12: qty 5

## 2021-01-12 NOTE — Assessment & Plan Note (Signed)
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 Neoadjuvant chemotherapy with Adriamycin and Cytoxan dose dense 4 followed by Taxol weekly 12 with carboplatin every 3 weeks x4(pembrolizumab will be added if she is triple negative) 2. Followed bybreast conserving surgery versusmastectomy andaxillary lymph node dissection (due to matted axillary lymph nodes) 3. Followed by adjuvant radiation therapy PET CT scan: Known breast cancer and lymphadenopathy in the axilla, supraclavicular, pectoral, internal mammary lymph nodes, no distant metastatic disease -------------------------------------------------------------------------- Current treatment:Completed 4 cycles ofneoadjuvant Adriamycin Cytoxan and Pembrolizumab, today is cycle 1 Taxol, carbo and [place]  Toxicities: Denies any nausea or vomiting. 1.Fatigue 2.alopecia 3.Chemotherapy-induced anemia today's hemoglobin 10.7 4.Runny nose  Monitoring closely for immunotherapy related adverse effects.: monitoring TSH.  RTC in 1 week for cycle 2 Taxol RTC in3weeks for cycle1 Taxol, carboplatin and Keytruda

## 2021-01-12 NOTE — Progress Notes (Signed)
  Oncology Clinical Pharmacist Practitioner Note  Rita Cole is a 60 y.o. female with a diagnosis of breast cancer. She is planning to start pembrolizumab+paclitaxel+carboplatin on 01/12/21 under the care of Dr. Nicholas Lose. She recently finished pembrolizumab+AC.   Per Dr. Geralyn Flash request, adding filgrastim to each cycle (5 - 8) of pembrolizumab+paclitaxel+carboplatin portion of treatment plan.  This will start on Thursdays (day 17) of treatment plan and continue for 3 days each cycle. First treatment tentatively 01/29/21. Scheduling and Revenue cycle teams made aware of this change, orders have been signed.  Clinical pharmacy will continue to support [name]  and Dr. Lindi Adie as needed.  Raina Mina, RPH-CPP,  01/12/2021  11:12 AM

## 2021-01-13 ENCOUNTER — Inpatient Hospital Stay: Payer: Federal, State, Local not specified - PPO

## 2021-01-13 VITALS — BP 155/92 | HR 77 | Temp 97.7°F | Resp 20 | Wt 213.2 lb

## 2021-01-13 DIAGNOSIS — C50412 Malignant neoplasm of upper-outer quadrant of left female breast: Secondary | ICD-10-CM

## 2021-01-13 DIAGNOSIS — Z171 Estrogen receptor negative status [ER-]: Secondary | ICD-10-CM

## 2021-01-13 LAB — T4: T4, Total: 9.2 ug/dL (ref 4.5–12.0)

## 2021-01-13 MED ORDER — SODIUM CHLORIDE 0.9 % IV SOLN
200.0000 mg | Freq: Once | INTRAVENOUS | Status: AC
  Administered 2021-01-13: 200 mg via INTRAVENOUS
  Filled 2021-01-13: qty 8

## 2021-01-13 MED ORDER — FAMOTIDINE 20 MG IN NS 100 ML IVPB
INTRAVENOUS | Status: AC
  Filled 2021-01-13: qty 100

## 2021-01-13 MED ORDER — SODIUM CHLORIDE 0.9% FLUSH
10.0000 mL | INTRAVENOUS | Status: DC | PRN
  Administered 2021-01-13: 10 mL
  Filled 2021-01-13: qty 10

## 2021-01-13 MED ORDER — SODIUM CHLORIDE 0.9 % IV SOLN
698.0000 mg | Freq: Once | INTRAVENOUS | Status: AC
  Administered 2021-01-13: 700 mg via INTRAVENOUS
  Filled 2021-01-13: qty 70

## 2021-01-13 MED ORDER — SODIUM CHLORIDE 0.9 % IV SOLN
10.0000 mg | Freq: Once | INTRAVENOUS | Status: AC
  Administered 2021-01-13: 10 mg via INTRAVENOUS
  Filled 2021-01-13: qty 10

## 2021-01-13 MED ORDER — DIPHENHYDRAMINE HCL 50 MG/ML IJ SOLN
25.0000 mg | Freq: Once | INTRAMUSCULAR | Status: AC
  Administered 2021-01-13: 25 mg via INTRAVENOUS

## 2021-01-13 MED ORDER — FAMOTIDINE 20 MG IN NS 100 ML IVPB
20.0000 mg | Freq: Once | INTRAVENOUS | Status: AC
  Administered 2021-01-13: 20 mg via INTRAVENOUS

## 2021-01-13 MED ORDER — PALONOSETRON HCL INJECTION 0.25 MG/5ML
INTRAVENOUS | Status: AC
  Filled 2021-01-13: qty 5

## 2021-01-13 MED ORDER — PALONOSETRON HCL INJECTION 0.25 MG/5ML
0.2500 mg | Freq: Once | INTRAVENOUS | Status: AC
  Administered 2021-01-13: 0.25 mg via INTRAVENOUS

## 2021-01-13 MED ORDER — SODIUM CHLORIDE 0.9 % IV SOLN
Freq: Once | INTRAVENOUS | Status: AC
  Filled 2021-01-13: qty 250

## 2021-01-13 MED ORDER — DIPHENHYDRAMINE HCL 50 MG/ML IJ SOLN
INTRAMUSCULAR | Status: AC
  Filled 2021-01-13: qty 1

## 2021-01-13 MED ORDER — SODIUM CHLORIDE 0.9 % IV SOLN
150.0000 mg | Freq: Once | INTRAVENOUS | Status: AC
  Administered 2021-01-13: 150 mg via INTRAVENOUS
  Filled 2021-01-13: qty 150

## 2021-01-13 MED ORDER — SODIUM CHLORIDE 0.9 % IV SOLN
80.0000 mg/m2 | Freq: Once | INTRAVENOUS | Status: AC
  Administered 2021-01-13: 168 mg via INTRAVENOUS
  Filled 2021-01-13: qty 28

## 2021-01-13 MED ORDER — COLD PACK MISC ONCOLOGY
1.0000 | Freq: Once | Status: AC | PRN
  Administered 2021-01-13: 1 via TOPICAL
  Filled 2021-01-13: qty 1

## 2021-01-13 MED ORDER — HEPARIN SOD (PORK) LOCK FLUSH 100 UNIT/ML IV SOLN
500.0000 [IU] | Freq: Once | INTRAVENOUS | Status: AC | PRN
Start: 2021-01-13 — End: 2021-01-13
  Administered 2021-01-13: 500 [IU]
  Filled 2021-01-13: qty 5

## 2021-01-13 NOTE — Patient Instructions (Signed)
[facility]  Discharge Instructions: Thank you for choosing [facility] to provide your oncology and hematology care.   If you have a lab appointment with the [facility], please go directly to the [facility] and check in at the registration area.   Wear comfortable clothing and clothing appropriate for easy access to any Portacath or PICC line.   We strive to give you quality time with your provider. You may need to reschedule your appointment if you arrive late (15 or more minutes).  Arriving late affects you and other patients whose appointments are after yours.  Also, if you miss three or more appointments without notifying the office, you may be dismissed from the clinic at the provider's discretion.      For prescription refill requests, have your pharmacy contact our office and allow 72 hours for refills to be completed.    Today you received the following chemotherapy and/or immunotherapy agents: Pembrolizumab (Keytruda), Paclitaxel (Taxol), and  Carboplatin.   To help prevent nausea and vomiting after your treatment, we encourage you to take your nausea medication as directed.  BELOW ARE SYMPTOMS THAT SHOULD BE REPORTED IMMEDIATELY: *FEVER GREATER THAN 100.4 F (38 C) OR HIGHER *CHILLS OR SWEATING *NAUSEA AND VOMITING THAT IS NOT CONTROLLED WITH YOUR NAUSEA MEDICATION *UNUSUAL SHORTNESS OF BREATH *UNUSUAL BRUISING OR BLEEDING *URINARY PROBLEMS (pain or burning when urinating, or frequent urination) *BOWEL PROBLEMS (unusual diarrhea, constipation, pain near the anus) TENDERNESS IN MOUTH AND THROAT WITH OR WITHOUT PRESENCE OF ULCERS (sore throat, sores in mouth, or a toothache) UNUSUAL RASH, SWELLING OR PAIN  UNUSUAL VAGINAL DISCHARGE OR ITCHING   Items with * indicate a potential emergency and should be followed up as soon as possible or go to the Emergency Department if any problems should occur.  Please show the CHEMOTHERAPY  ALERT CARD or IMMUNOTHERAPY ALERT CARD at check-in to the Emergency Department and triage nurse.  Should you have questions after your visit or need to cancel or reschedule your appointment, please contact [facility]  Dept: [phone]  and follow the prompts.  Office hours are 8:00 a.m. to 4:30 p.m. Monday - Friday. Please note that voicemails left after 4:00 p.m. may not be returned until the following business day.  We are closed weekends and major holidays. You have access to a nurse at all times for urgent questions. Please call the main number to the clinic Dept: [phone] and follow the prompts.   For any non-urgent questions, you may also contact your provider using MyChart. We now offer e-Visits for anyone 71 and older to request care online for non-urgent symptoms. For details visit [email or web address].   Also download the MyChart app! Go to the app store, search "MyChart", open the app, select [facility], and log in with your MyChart username and password.  Due to Covid, a mask is required upon entering the hospital/clinic. If you do not have a mask, one will be given to you upon arrival. For doctor visits, patients may have 1 support person aged 83 or older with them. For treatment visits, patients cannot have anyone with them due to current Covid guidelines and our immunocompromised population.   Paclitaxel injection What is this medication? PACLITAXEL (PAK li TAX el) is a chemotherapy drug. It targets fast dividing cells, like cancer cells, and causes these cells to die. This medicine is used to treat ovarian cancer, breast cancer, lung cancer, Kaposi's sarcoma, andother cancers. This  medicine may be used for other purposes; ask your health care provider orpharmacist if you have questions. COMMON BRAND NAME(S): Onxol, Taxol What should I tell my care team before I take this medication? They need to know if you have any of these  conditions: history of irregular heartbeat liver disease low blood counts, like low white cell, platelet, or red cell counts lung or breathing disease, like asthma tingling of the fingers or toes, or other nerve disorder an unusual or allergic reaction to paclitaxel, alcohol, polyoxyethylated castor oil, other chemotherapy, other medicines, foods, dyes, or preservatives pregnant or trying to get pregnant breast-feeding How should I use this medication? This drug is given as an infusion into a vein. It is administered in a hospitalor clinic by a specially trained health care professional. Talk to your pediatrician regarding the use of this medicine in children.Special care may be needed. Overdosage: If you think you have taken too much of this medicine contact apoison control center or emergency room at once. NOTE: This medicine is only for you. Do not share this medicine with others. What if I miss a dose? It is important not to miss your dose. Call your doctor or health careprofessional if you are unable to keep an appointment. What may interact with this medication? Do not take this medicine with any of the following medications: live virus vaccines This medicine may also interact with the following medications: antiviral medicines for hepatitis, HIV or AIDS certain antibiotics like erythromycin and clarithromycin certain medicines for fungal infections like ketoconazole and itraconazole certain medicines for seizures like carbamazepine, phenobarbital, phenytoin gemfibrozil nefazodone rifampin St. John's wort This list may not describe all possible interactions. Give your health care provider a list of all the medicines, herbs, non-prescription drugs, or dietary supplements you use. Also tell them if you smoke, drink alcohol, or use illegaldrugs. Some items may interact with your medicine. What should I watch for while using this medication? Your condition will be monitored carefully  while you are receiving this medicine. You will need important blood work done while you are taking thismedicine. This medicine can cause serious allergic reactions. To reduce your risk you will need to take other medicine(s) before treatment with this medicine. If you experience allergic reactions like skin rash, itching or hives, swelling of theface, lips, or tongue, tell your doctor or health care professional right away. In some cases, you may be given additional medicines to help with side effects.Follow all directions for their use. This drug may make you feel generally unwell. This is not uncommon, as chemotherapy can affect healthy cells as well as cancer cells. Report any side effects. Continue your course of treatment even though you feel ill unless yourdoctor tells you to stop. Call your doctor or health care professional for advice if you get a fever, chills or sore throat, or other symptoms of a cold or flu. Do not treat yourself. This drug decreases your body's ability to fight infections. Try toavoid being around people who are sick. This medicine may increase your risk to bruise or bleed. Call your doctor orhealth care professional if you notice any unusual bleeding. Be careful brushing and flossing your teeth or using a toothpick because you may get an infection or bleed more easily. If you have any dental work done,tell your dentist you are receiving this medicine. Avoid taking products that contain aspirin, acetaminophen, ibuprofen, naproxen, or ketoprofen unless instructed by your doctor. These medicines may hide afever. Do not become pregnant while  taking this medicine. Women should inform their doctor if they wish to become pregnant or think they might be pregnant. There is a potential for serious side effects to an unborn child. Talk to your health care professional or pharmacist for more information. Do not breast-feed aninfant while taking this medicine. Men are advised not to  father a child while receiving this medicine. This product may contain alcohol. Ask your pharmacist or healthcare provider if this medicine contains alcohol. Be sure to tell all healthcare providers you are taking this medicine. Certain medicines, like metronidazole and disulfiram, can cause an unpleasant reaction when taken with alcohol. The reaction includes flushing, headache, nausea, vomiting, sweating, and increased thirst. Thereaction can last from 30 minutes to several hours. What side effects may I notice from receiving this medication? Side effects that you should report to your doctor or health care professionalas soon as possible: allergic reactions like skin rash, itching or hives, swelling of the face, lips, or tongue breathing problems changes in vision fast, irregular heartbeat high or low blood pressure mouth sores pain, tingling, numbness in the hands or feet signs of decreased platelets or bleeding - bruising, pinpoint red spots on the skin, black, tarry stools, blood in the urine signs of decreased red blood cells - unusually weak or tired, feeling faint or lightheaded, falls signs of infection - fever or chills, cough, sore throat, pain or difficulty passing urine signs and symptoms of liver injury like dark yellow or brown urine; general ill feeling or flu-like symptoms; light-colored stools; loss of appetite; nausea; right upper belly pain; unusually weak or tired; yellowing of the eyes or skin swelling of the ankles, feet, hands unusually slow heartbeat Side effects that usually do not require medical attention (report to yourdoctor or health care professional if they continue or are bothersome): diarrhea hair loss loss of appetite muscle or joint pain nausea, vomiting pain, redness, or irritation at site where injected tiredness This list may not describe all possible side effects. Call your doctor for medical advice about side effects. You may report side effects to  FDA at1-800-FDA-1088. Where should I keep my medication? This drug is given in a hospital or clinic and will not be stored at home. NOTE: This sheet is a summary. It may not cover all possible information. If you have questions about this medicine, talk to your doctor, pharmacist, orhealth care provider.  2022 Elsevier/Gold Standard (2019-05-16 13:37:23)  Carboplatin injection What is this medication? CARBOPLATIN (KAR boe pla tin) is a chemotherapy drug. It targets fast dividing cells, like cancer cells, and causes these cells to die. This medicine is usedto treat ovarian cancer and many other cancers. This medicine may be used for other purposes; ask your health care provider orpharmacist if you have questions. COMMON BRAND NAME(S): Paraplatin What should I tell my care team before I take this medication? They need to know if you have any of these conditions: blood disorders hearing problems kidney disease recent or ongoing radiation therapy an unusual or allergic reaction to carboplatin, cisplatin, other chemotherapy, other medicines, foods, dyes, or preservatives pregnant or trying to get pregnant breast-feeding How should I use this medication? This drug is usually given as an infusion into a vein. It is administered in [facility] or clinic by a specially trained health care professional. Talk to your pediatrician regarding the use of this medicine in children.Special care may be needed. Overdosage: If you think you have taken too much of this medicine contact apoison control center or  emergency room at once. NOTE: This medicine is only for you. Do not share this medicine with others. What if I miss a dose? It is important not to miss a dose. Call your doctor or health careprofessional if you are unable to keep an appointment. What may interact with this medication? medicines for seizures medicines to increase blood counts like filgrastim, pegfilgrastim, sargramostim some  antibiotics like amikacin, gentamicin, neomycin, streptomycin, tobramycin vaccines Talk to your doctor or health care professional before taking any of thesemedicines: acetaminophen aspirin ibuprofen ketoprofen naproxen This list may not describe all possible interactions. Give your health care provider a list of all the medicines, herbs, non-prescription drugs, or dietary supplements you use. Also tell them if you smoke, drink alcohol, or use illegaldrugs. Some items may interact with your medicine. What should I watch for while using this medication? Your condition will be monitored carefully while you are receiving this medicine. You will need important blood work done while you are taking thismedicine. This drug may make you feel generally unwell. This is not uncommon, as chemotherapy can affect healthy cells as well as cancer cells. Report any side effects. Continue your course of treatment even though you feel ill unless yourdoctor tells you to stop. In some cases, you may be given additional medicines to help with side effects.Follow all directions for their use. Call your doctor or health care professional for advice if you get a fever, chills or sore throat, or other symptoms of a cold or flu. Do not treat yourself. This drug decreases your body's ability to fight infections. Try toavoid being around people who are sick. This medicine may increase your risk to bruise or bleed. Call your doctor orhealth care professional if you notice any unusual bleeding. Be careful brushing and flossing your teeth or using a toothpick because you may get an infection or bleed more easily. If you have any dental work done,tell your dentist you are receiving this medicine. Avoid taking products that contain aspirin, acetaminophen, ibuprofen, naproxen, or ketoprofen unless instructed by your doctor. These medicines may hide afever. Do not become pregnant while taking this medicine. Women should inform their  doctor if they wish to become pregnant or think they might be pregnant. There is a potential for serious side effects to an unborn child. Talk to your health care professional or pharmacist for more information. Do not breast-feed aninfant while taking this medicine. What side effects may I notice from receiving this medication? Side effects that you should report to your doctor or health care professionalas soon as possible: allergic reactions like skin rash, itching or hives, swelling of the face, lips, or tongue signs of infection - fever or chills, cough, sore throat, pain or difficulty passing urine signs of decreased platelets or bleeding - bruising, pinpoint red spots on the skin, black, tarry stools, nosebleeds signs of decreased red blood cells - unusually weak or tired, fainting spells, lightheadedness breathing problems changes in hearing changes in vision chest pain high blood pressure low blood counts - This drug may decrease the number of white blood cells, red blood cells and platelets. You may be at increased risk for infections and bleeding. nausea and vomiting pain, swelling, redness or irritation at the injection site pain, tingling, numbness in the hands or feet problems with balance, talking, walking trouble passing urine or change in the amount of urine Side effects that usually do not require medical attention (report to yourdoctor or health care professional if they continue or are  bothersome): hair loss loss of appetite metallic taste in the mouth or changes in taste This list may not describe all possible side effects. Call your doctor for medical advice about side effects. You may report side effects to FDA at1-800-FDA-1088. Where should I keep my medication? This drug is given in a hospital or clinic and will not be stored at home. NOTE: This sheet is a summary. It may not cover all possible information. If you have questions about this medicine, talk to your  doctor, pharmacist, orhealth care provider.  2022 Elsevier/Gold Standard (2007-09-19 14:38:05)

## 2021-01-16 ENCOUNTER — Ambulatory Visit (HOSPITAL_COMMUNITY)
Admission: RE | Admit: 2021-01-16 | Discharge: 2021-01-16 | Disposition: A | Discharge status: Home / Self Care | Payer: Federal, State, Local not specified - PPO | Source: Ambulatory Visit | Attending: Hematology and Oncology | Admitting: Hematology and Oncology

## 2021-01-16 ENCOUNTER — Other Ambulatory Visit: Payer: Self-pay

## 2021-01-16 DIAGNOSIS — I427 Cardiomyopathy due to drug and external agent: Secondary | ICD-10-CM | POA: Insufficient documentation

## 2021-01-16 DIAGNOSIS — Z0189 Encounter for other specified special examinations: Secondary | ICD-10-CM | POA: Diagnosis not present

## 2021-01-16 DIAGNOSIS — T50905D Adverse effect of unspecified drugs, medicaments and biological substances, subsequent encounter: Secondary | ICD-10-CM | POA: Insufficient documentation

## 2021-01-16 DIAGNOSIS — I119 Hypertensive heart disease without heart failure: Secondary | ICD-10-CM | POA: Insufficient documentation

## 2021-01-16 DIAGNOSIS — Z79899 Other long term (current) drug therapy: Secondary | ICD-10-CM | POA: Diagnosis not present

## 2021-01-16 DIAGNOSIS — C50919 Malignant neoplasm of unspecified site of unspecified female breast: Secondary | ICD-10-CM | POA: Diagnosis not present

## 2021-01-16 DIAGNOSIS — Z5181 Encounter for therapeutic drug level monitoring: Secondary | ICD-10-CM | POA: Insufficient documentation

## 2021-01-16 LAB — ECHOCARDIOGRAM COMPLETE
Area-P 1/2: 3.46 cm2
S' Lateral: 2.8 cm

## 2021-01-18 NOTE — Progress Notes (Signed)
[facility] Follow up:    Riki Sheer, NP [place] [address]   DIAGNOSIS: Cancer Staging Malignant neoplasm of upper-outer quadrant of left breast in female, estrogen receptor negative ([facility]) Staging form: Breast, AJCC 8th Edition - Clinical stage from 10/01/2020: Stage IIIC (cT4b, cN2a, cM0, G3, ER-, PR-, HER2: Equivocal) - Signed by Nicholas Lose, MD on 10/01/2020 Stage prefix: Initial diagnosis Histologic grading system: 3 grade system   SUMMARY OF ONCOLOGIC HISTORY: Oncology History  Malignant neoplasm of upper-outer quadrant of left breast in female, estrogen receptor negative ([facility])  09/30/2020 Initial Diagnosis   Swollen left breast: Mammogram detected left breast mass UOQ skin thickening with 3 abnormal lymph nodes that are matted and hard, ultrasound 2.7 cm, 2.9 cm, 3 cm and 3 lymph nodes biopsy revealed grade 2-3 IDC with DCIS ER/PR negative HER-2 IHC equivocal FISH pending, Ki-67 60%, lymph node biopsy positive   10/01/2020 Cancer Staging   Staging form: Breast, AJCC 8th Edition - Clinical stage from 10/01/2020: Stage IIIC (cT4b, cN2a, cM0, G3, ER-, PR-, HER2: Equivocal) - Signed by Nicholas Lose, MD on 10/01/2020  Stage prefix: Initial diagnosis  Histologic grading system: 3 grade system    10/13/2020 Genetic Testing   Negative hereditary cancer genetic testing: no pathogenic variants detected in [facility] CustomNext-Cancer +RNAinsight Panel.  The report date is October 13, 2020.   The CustomNext-Cancer+RNAinsight panel offered by [facility] includes sequencing and rearrangement analysis for the following 47 genes:  APC, ATM, AXIN2, BARD1, BMPR1A, BRCA1, BRCA2, BRIP1, CDH1, CDK4, CDKN2A, CHEK2, DICER1, EPCAM, GREM1, HOXB13, MEN1, MLH1, MSH2, MSH3, MSH6, MUTYH, NBN, NF1, NF2, NTHL1, PALB2, PMS2, POLD1, POLE, PTEN, RAD51C, RAD51D, RECQL, RET, SDHA, SDHAF2, SDHB, SDHC, SDHD, SMAD4, SMARCA4, STK11, TP53, TSC1, TSC2, and VHL.  RNA data is  routinely analyzed for use in variant interpretation for all genes.   01/13/2021 -  Chemotherapy    Patient is on Treatment Plan: BREAST PEMBROLIZUMAB + AC Q21D X 4 CYCLES / PEMBROLIZUMAB + CARBOPLATIN D1 + PACLITAXEL D1,8,15 Q21D X 4 CYCLES         CURRENT THERAPY: Neoadjuvant Carbo, Taxol, Keytruda  INTERVAL HISTORY: Tima L Withers-Smith 60 y.o. female returns for evaluation prior to week two of her weekly Taxol.  She completed 4 weeks of neoadjuvant Adriamycin, Cytoxan, and Keytruda.    She began Carbo, Taxol, and [place] last week.  She receives [place] on day 1 of a 21 day cycle and Taxol on days 1, 8, 15 of a 21 day cycle.  She is due for Taxol only today.    She underwent an echocardiogram last week that showed a normal EF.  She also met with Dr. Iran Planas and has decided against reconstruction.  She is otherwise feeling well. She has maintained a positive attitude.     Patient Active Problem List   Diagnosis Date Noted   Port-A-Cath in place 11/10/2020   Genetic testing 10/13/2020   Family history of lung cancer 10/02/2020   Malignant neoplasm of upper-outer quadrant of left breast in female, estrogen receptor negative ([facility]) 09/30/2020    has No Known Allergies.  MEDICAL HISTORY: Past Medical History:  Diagnosis Date   Breast cancer [facility])    Family history of lung cancer 10/02/2020   Hypertension    Pre-diabetes     SURGICAL HISTORY: Past Surgical History:  Procedure Laterality Date   BUNIONECTOMY     IR IMAGING GUIDED PORT INSERTION  10/14/2020   MYOMECTOMY  SOCIAL HISTORY: Social History   Socioeconomic History   Marital status: Divorced    Spouse name: Not on file   Number of children: Not on file   Years of education: Not on file   Highest education level: Not on file  Occupational History   Not on file  Tobacco Use   Smoking status: Former    Packs/day: 0.25    Types: Cigarettes   Smokeless tobacco: Never  Vaping Use   Vaping  Use: Never used  Substance and Sexual Activity   Alcohol use: No   Drug use: No   Sexual activity: Yes  Other Topics Concern   Not on file  Social History Narrative   Not on file   Social Determinants of Health   Financial Resource Strain: Not on file  Food Insecurity: Not on file  Transportation Needs: Not on file  Physical Activity: Not on file  Stress: Not on file  Social Connections: Not on file  Intimate Partner Violence: Not on file    FAMILY HISTORY: Family History  Problem Relation Age of Onset   Heart attack Father    Lung cancer Father        dx unknown age   Cancer Other        MGM's mother; unknown type; dx unknown age    Review of Systems  Constitutional:  Positive for fatigue. Negative for appetite change, chills, fever and unexpected weight change.  HENT:   Negative for hearing loss, lump/mass and trouble swallowing.   Eyes:  Negative for eye problems and icterus.  Respiratory:  Negative for chest tightness, cough and shortness of breath.   Cardiovascular:  Negative for chest pain, leg swelling and palpitations.  Gastrointestinal:  Negative for abdominal distention, abdominal pain, constipation, diarrhea, nausea and vomiting.  Endocrine: Negative for hot flashes.  Genitourinary:  Negative for difficulty urinating.   Musculoskeletal:  Negative for arthralgias.  Skin:  Negative for itching and rash.  Neurological:  Negative for dizziness, extremity weakness, headaches and numbness.  Hematological:  Negative for adenopathy. Does not bruise/bleed easily.  Psychiatric/Behavioral:  Negative for depression. The patient is not nervous/anxious.      PHYSICAL EXAMINATION  ECOG PERFORMANCE STATUS: 1 - Symptomatic but completely ambulatory  Vitals:   01/19/21 1314  BP: 136/82  Pulse: 100  Resp: 18  Temp: 97.8 F (36.6 C)  SpO2: 100%    Physical Exam Constitutional:      General: She is not in acute distress.    Appearance: Normal appearance. She is  not toxic-appearing.  HENT:     Head: Normocephalic and atraumatic.  Eyes:     General: No scleral icterus. Cardiovascular:     Rate and Rhythm: Normal rate and regular rhythm.     Pulses: Normal pulses.     Heart sounds: Normal heart sounds.  Pulmonary:     Effort: Pulmonary effort is normal.     Breath sounds: Normal breath sounds.  Abdominal:     General: Abdomen is flat. Bowel sounds are normal. There is no distension.     Palpations: Abdomen is soft.     Tenderness: There is no abdominal tenderness.  Musculoskeletal:        General: No swelling.     Cervical back: Neck supple.  Lymphadenopathy:     Cervical: No cervical adenopathy.  Skin:    General: Skin is warm and dry.     Findings: No rash.  Neurological:     General: No focal  deficit present.     Mental Status: She is alert.  Psychiatric:        Mood and Affect: Mood normal.        Behavior: Behavior normal.    LABORATORY DATA:  CBC    Component Value Date/Time   WBC 2.7 (L) 01/19/2021 1306   WBC 9.3 09/06/2020 2055   RBC 3.41 (L) 01/19/2021 1306   HGB 10.6 (L) 01/19/2021 1306   HCT 32.0 (L) 01/19/2021 1306   PLT 200 01/19/2021 1306   MCV 93.8 01/19/2021 1306   MCH 31.1 01/19/2021 1306   MCHC 33.1 01/19/2021 1306   RDW 18.5 (H) 01/19/2021 1306   LYMPHSABS 0.4 (L) 01/19/2021 1306   MONOABS 0.3 01/19/2021 1306   EOSABS 0.0 01/19/2021 1306   BASOSABS 0.0 01/19/2021 1306    CMP     Component Value Date/Time   NA 139 01/19/2021 1306   K 3.7 01/19/2021 1306   CL 103 01/19/2021 1306   CO2 28 01/19/2021 1306   GLUCOSE 104 (H) 01/19/2021 1306   BUN 13 01/19/2021 1306   CREATININE 0.79 01/19/2021 1306   CALCIUM 9.9 01/19/2021 1306   PROT 7.5 01/19/2021 1306   ALBUMIN 3.6 01/19/2021 1306   AST 23 01/19/2021 1306   ALT 23 01/19/2021 1306   ALKPHOS 68 01/19/2021 1306   BILITOT 0.3 01/19/2021 1306   GFRNONAA >60 01/19/2021 1306          ASSESSMENT and THERAPY PLAN:   Malignant neoplasm of  upper-outer quadrant of left breast in female, estrogen receptor negative ([facility]) 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 Neoadjuvant chemotherapy with Adriamycin and Cytoxan dose dense 4 followed by Taxol weekly 12 with carboplatin every 3 weeks x4 (pembrolizumab will be added if she is triple negative) 2. Followed by breast conserving surgery versus mastectomy and axillary lymph node dissection (due to matted axillary lymph nodes) 3. Followed by adjuvant radiation therapy PET CT scan: Known breast cancer and lymphadenopathy in the axilla, supraclavicular, pectoral, internal mammary lymph nodes, no distant metastatic disease -------------------------------------------------------------------------- Current treatment: Completed 4 cycles of neoadjuvant Adriamycin Cytoxan and Pembrolizumab, today is cycle 1 day 8 Taxol, carbo and [place] receives only Taxol today  Toxicities: Denies any nausea or vomiting. 1. Fatigue 2. Alopecia  Monitoring closely for peripheral neuropathy   Monitoring closely for immunotherapy related adverse effects.:  monitoring TSH.   Neema is doing quite well and will continue with treatment.  Her labs had not resulted during my office visit with her, they were pending at the time of her appointment completion.  We will see her back in 1 week for labs, f/u and her next taxol.  She knows to call for any questions that may arise between now and her next appointment.  We are happy to see her sooner if needed.   Total encounter time: 20 minutes in face to face visit time, lab review, chart review, care coordination, and documentation of the encounter.  Wilber Bihari, NP 01/19/21 3:38 PM Medical Oncology and Hematology [facility] [place], [place] [address] Tel. [phone]    Fax. [phone]  *Total Encounter Time as defined by the Centers for  Medicare and Medicaid Services includes, in addition to the face-to-face time of a patient visit (documented in the note above) non-face-to-face time: obtaining and reviewing outside history, ordering and reviewing medications, tests or procedures, care coordination (communications with other health care professionals or caregivers) and documentation in the medical record.

## 2021-01-19 ENCOUNTER — Encounter: Payer: Self-pay | Admitting: *Deleted

## 2021-01-19 ENCOUNTER — Inpatient Hospital Stay: Payer: Federal, State, Local not specified - PPO | Admitting: Adult Health

## 2021-01-19 ENCOUNTER — Inpatient Hospital Stay: Payer: Federal, State, Local not specified - PPO

## 2021-01-19 ENCOUNTER — Other Ambulatory Visit: Payer: Self-pay

## 2021-01-19 ENCOUNTER — Encounter: Payer: Self-pay | Admitting: Adult Health

## 2021-01-19 VITALS — BP 136/82 | HR 100 | Temp 97.8°F | Resp 18 | Ht 66.0 in | Wt 211.4 lb

## 2021-01-19 DIAGNOSIS — C50412 Malignant neoplasm of upper-outer quadrant of left female breast: Secondary | ICD-10-CM | POA: Diagnosis not present

## 2021-01-19 DIAGNOSIS — Z171 Estrogen receptor negative status [ER-]: Secondary | ICD-10-CM

## 2021-01-19 DIAGNOSIS — Z95828 Presence of other vascular implants and grafts: Secondary | ICD-10-CM

## 2021-01-19 LAB — CMP (CANCER CENTER ONLY)
ALT: 23 U/L (ref 0–44)
AST: 23 U/L (ref 15–41)
Albumin: 3.6 g/dL (ref 3.5–5.0)
Alkaline Phosphatase: 68 U/L (ref 38–126)
Anion gap: 8 (ref 5–15)
BUN: 13 mg/dL (ref 6–20)
CO2: 28 mmol/L (ref 22–32)
Calcium: 9.9 mg/dL (ref 8.9–10.3)
Chloride: 103 mmol/L (ref 98–111)
Creatinine: 0.79 mg/dL (ref 0.44–1.00)
GFR, Estimated: >60 mL/min (ref >60)
Glucose, Bld: 104 mg/dL — ABNORMAL HIGH (ref 70–99)
Potassium: 3.7 mmol/L (ref 3.5–5.1)
Sodium: 139 mmol/L (ref 135–145)
Total Bilirubin: 0.3 mg/dL (ref 0.3–1.2)
Total Protein: 7.5 g/dL (ref 6.5–8.1)

## 2021-01-19 LAB — CBC WITH DIFFERENTIAL (CANCER CENTER ONLY)
Abs Immature Granulocytes: 0.02 10*3/uL (ref 0.00–0.07)
Basophils Absolute: 0 10*3/uL (ref 0.0–0.1)
Basophils Relative: 1 %
Eosinophils Absolute: 0 10*3/uL (ref 0.0–0.5)
Eosinophils Relative: 2 %
HCT: 32 % — ABNORMAL LOW (ref 36.0–46.0)
Hemoglobin: 10.6 g/dL — ABNORMAL LOW (ref 12.0–15.0)
Immature Granulocytes: 1 %
Lymphocytes Relative: 16 %
Lymphs Abs: 0.4 10*3/uL — ABNORMAL LOW (ref 0.7–4.0)
MCH: 31.1 pg (ref 26.0–34.0)
MCHC: 33.1 g/dL (ref 30.0–36.0)
MCV: 93.8 fL (ref 80.0–100.0)
Monocytes Absolute: 0.3 10*3/uL (ref 0.1–1.0)
Monocytes Relative: 12 %
Neutro Abs: 1.9 10*3/uL (ref 1.7–7.7)
Neutrophils Relative %: 68 %
Platelet Count: 200 10*3/uL (ref 150–400)
RBC: 3.41 MIL/uL — ABNORMAL LOW (ref 3.87–5.11)
RDW: 18.5 % — ABNORMAL HIGH (ref 11.5–15.5)
WBC Count: 2.7 10*3/uL — ABNORMAL LOW (ref 4.0–10.5)
nRBC: 0 % (ref 0.0–0.2)

## 2021-01-19 LAB — TSH: TSH: 2.786 u[IU]/mL (ref 0.308–3.960)

## 2021-01-19 MED ORDER — SODIUM CHLORIDE 0.9% FLUSH
10.0000 mL | INTRAVENOUS | Status: DC | PRN
Start: 2021-01-19 — End: 2021-01-19
  Administered 2021-01-19: 10 mL
  Filled 2021-01-19: qty 10

## 2021-01-19 MED ORDER — DIPHENHYDRAMINE HCL 50 MG/ML IJ SOLN
25.0000 mg | Freq: Once | INTRAMUSCULAR | Status: AC
  Administered 2021-01-19: 25 mg via INTRAVENOUS

## 2021-01-19 MED ORDER — SODIUM CHLORIDE 0.9 % IV SOLN
Freq: Once | INTRAVENOUS | Status: AC
  Filled 2021-01-19: qty 250

## 2021-01-19 MED ORDER — HEPARIN SOD (PORK) LOCK FLUSH 100 UNIT/ML IV SOLN
500.0000 [IU] | Freq: Once | INTRAVENOUS | Status: AC | PRN
  Administered 2021-01-19: 500 [IU]
  Filled 2021-01-19: qty 5

## 2021-01-19 MED ORDER — SODIUM CHLORIDE 0.9 % IV SOLN
80.0000 mg/m2 | Freq: Once | INTRAVENOUS | Status: AC
  Administered 2021-01-19: 168 mg via INTRAVENOUS
  Filled 2021-01-19: qty 28

## 2021-01-19 MED ORDER — FAMOTIDINE 20 MG IN NS 100 ML IVPB
INTRAVENOUS | Status: AC
  Filled 2021-01-19: qty 100

## 2021-01-19 MED ORDER — DIPHENHYDRAMINE HCL 50 MG/ML IJ SOLN
INTRAMUSCULAR | Status: AC
  Filled 2021-01-19: qty 1

## 2021-01-19 MED ORDER — FAMOTIDINE 20 MG IN NS 100 ML IVPB
20.0000 mg | Freq: Once | INTRAVENOUS | Status: AC
  Administered 2021-01-19: 20 mg via INTRAVENOUS

## 2021-01-19 MED ORDER — SODIUM CHLORIDE 0.9 % IV SOLN
10.0000 mg | Freq: Once | INTRAVENOUS | Status: AC
  Administered 2021-01-19: 10 mg via INTRAVENOUS
  Filled 2021-01-19: qty 10

## 2021-01-19 MED ORDER — SODIUM CHLORIDE 0.9% FLUSH
10.0000 mL | Freq: Once | INTRAVENOUS | Status: AC
  Administered 2021-01-19: 10 mL
  Filled 2021-01-19: qty 10

## 2021-01-19 NOTE — Patient Instructions (Signed)
[facility]  Discharge Instructions: Thank you for choosing [facility] to provide your oncology and hematology care.   If you have a lab appointment with the [facility], please go directly to the [facility] and check in at the registration area.   Wear comfortable clothing and clothing appropriate for easy access to any Portacath or PICC line.   We strive to give you quality time with your provider. You may need to reschedule your appointment if you arrive late (15 or more minutes).  Arriving late affects you and other patients whose appointments are after yours.  Also, if you miss three or more appointments without notifying the office, you may be dismissed from the clinic at the provider's discretion.      For prescription refill requests, have your pharmacy contact our office and allow 72 hours for refills to be completed.    Today you received the following chemotherapy and/or immunotherapy agents: Taxol   To help prevent nausea and vomiting after your treatment, we encourage you to take your nausea medication as directed.  BELOW ARE SYMPTOMS THAT SHOULD BE REPORTED IMMEDIATELY: *FEVER GREATER THAN 100.4 F (38 C) OR HIGHER *CHILLS OR SWEATING *NAUSEA AND VOMITING THAT IS NOT CONTROLLED WITH YOUR NAUSEA MEDICATION *UNUSUAL SHORTNESS OF BREATH *UNUSUAL BRUISING OR BLEEDING *URINARY PROBLEMS (pain or burning when urinating, or frequent urination) *BOWEL PROBLEMS (unusual diarrhea, constipation, pain near the anus) TENDERNESS IN MOUTH AND THROAT WITH OR WITHOUT PRESENCE OF ULCERS (sore throat, sores in mouth, or a toothache) UNUSUAL RASH, SWELLING OR PAIN  UNUSUAL VAGINAL DISCHARGE OR ITCHING   Items with * indicate a potential emergency and should be followed up as soon as possible or go to the Emergency Department if any problems should occur.  Please show the CHEMOTHERAPY ALERT CARD or IMMUNOTHERAPY ALERT CARD at check-in to the  Emergency Department and triage nurse.  Should you have questions after your visit or need to cancel or reschedule your appointment, please contact [facility]  Dept: [phone]  and follow the prompts.  Office hours are 8:00 a.m. to 4:30 p.m. Monday - Friday. Please note that voicemails left after 4:00 p.m. may not be returned until the following business day.  We are closed weekends and major holidays. You have access to a nurse at all times for urgent questions. Please call the main number to the clinic Dept: [phone] and follow the prompts.   For any non-urgent questions, you may also contact your provider using MyChart. We now offer e-Visits for anyone 74 and older to request care online for non-urgent symptoms. For details visit [email or web address].   Also download the MyChart app! Go to the app store, search "MyChart", open the app, select [facility], and log in with your MyChart username and password.  Due to Covid, a mask is required upon entering the hospital/clinic. If you do not have a mask, one will be given to you upon arrival. For doctor visits, patients may have 1 support person aged 67 or older with them. For treatment visits, patients cannot have anyone with them due to current Covid guidelines and our immunocompromised population.

## 2021-01-19 NOTE — Assessment & Plan Note (Signed)
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 Neoadjuvant chemotherapy with Adriamycin and Cytoxan dose dense 4 followed by Taxol weekly 12 with carboplatin every 3 weeks x4(pembrolizumab will be added if she is triple negative) 2. Followed bybreast conserving surgery versusmastectomy andaxillary lymph node dissection (due to matted axillary lymph nodes) 3. Followed by adjuvant radiation therapy PET CT scan: Known breast cancer and lymphadenopathy in the axilla, supraclavicular, pectoral, internal mammary lymph nodes, no distant metastatic disease -------------------------------------------------------------------------- Current treatment:Completed 4 cycles ofneoadjuvant Adriamycin Cytoxan and Pembrolizumab, today is cycle 1 day 8 Taxol, [name] receives only Taxol today  Toxicities: Denies any nausea or vomiting. 1.Fatigue 2.Alopecia  Monitoring closely for peripheral neuropathy  Monitoring closely for immunotherapy related adverse effects.: monitoring TSH.  Rita Cole is doing quite well and will continue with treatment.  Her labs had not resulted during my office visit with her, they were pending at the time of her appointment completion.  We will see her back in 1 week for labs, f/u and her next taxol.  She knows to call for any questions that may arise between now and her next appointment.  We are happy to see her sooner if needed.

## 2021-01-20 LAB — T4: T4, Total: 9.6 ug/dL (ref 4.5–12.0)

## 2021-01-23 ENCOUNTER — Telehealth: Payer: Self-pay | Admitting: Hematology and Oncology

## 2021-01-23 NOTE — Telephone Encounter (Signed)
R/s appt time per 7/29 sch msg. Called pt, no answer. Left msg with updated appt time.

## 2021-01-26 ENCOUNTER — Inpatient Hospital Stay: Payer: Federal, State, Local not specified - PPO

## 2021-01-26 ENCOUNTER — Other Ambulatory Visit: Payer: Self-pay

## 2021-01-26 ENCOUNTER — Inpatient Hospital Stay: Payer: Federal, State, Local not specified - PPO | Attending: Hematology and Oncology

## 2021-01-26 VITALS — BP 160/90 | HR 87 | Temp 98.6°F | Resp 16

## 2021-01-26 DIAGNOSIS — T451X5A Adverse effect of antineoplastic and immunosuppressive drugs, initial encounter: Secondary | ICD-10-CM | POA: Insufficient documentation

## 2021-01-26 DIAGNOSIS — Z79899 Other long term (current) drug therapy: Secondary | ICD-10-CM | POA: Diagnosis not present

## 2021-01-26 DIAGNOSIS — Z95828 Presence of other vascular implants and grafts: Secondary | ICD-10-CM

## 2021-01-26 DIAGNOSIS — L659 Nonscarring hair loss, unspecified: Secondary | ICD-10-CM | POA: Diagnosis not present

## 2021-01-26 DIAGNOSIS — R5383 Other fatigue: Secondary | ICD-10-CM | POA: Diagnosis not present

## 2021-01-26 DIAGNOSIS — Z5112 Encounter for antineoplastic immunotherapy: Secondary | ICD-10-CM | POA: Insufficient documentation

## 2021-01-26 DIAGNOSIS — Z171 Estrogen receptor negative status [ER-]: Secondary | ICD-10-CM | POA: Diagnosis not present

## 2021-01-26 DIAGNOSIS — I10 Essential (primary) hypertension: Secondary | ICD-10-CM | POA: Insufficient documentation

## 2021-01-26 DIAGNOSIS — R519 Headache, unspecified: Secondary | ICD-10-CM | POA: Diagnosis not present

## 2021-01-26 DIAGNOSIS — D6481 Anemia due to antineoplastic chemotherapy: Secondary | ICD-10-CM | POA: Insufficient documentation

## 2021-01-26 DIAGNOSIS — C50412 Malignant neoplasm of upper-outer quadrant of left female breast: Secondary | ICD-10-CM | POA: Diagnosis present

## 2021-01-26 DIAGNOSIS — Z5111 Encounter for antineoplastic chemotherapy: Secondary | ICD-10-CM | POA: Diagnosis present

## 2021-01-26 DIAGNOSIS — D6959 Other secondary thrombocytopenia: Secondary | ICD-10-CM | POA: Insufficient documentation

## 2021-01-26 LAB — CMP (CANCER CENTER ONLY)
ALT: 27 U/L (ref 0–44)
AST: 26 U/L (ref 15–41)
Albumin: 3.9 g/dL (ref 3.5–5.0)
Alkaline Phosphatase: 72 U/L (ref 38–126)
Anion gap: 8 (ref 5–15)
BUN: 17 mg/dL (ref 6–20)
CO2: 29 mmol/L (ref 22–32)
Calcium: 10 mg/dL (ref 8.9–10.3)
Chloride: 105 mmol/L (ref 98–111)
Creatinine: 0.79 mg/dL (ref 0.44–1.00)
GFR, Estimated: >60 mL/min (ref >60)
Glucose, Bld: 100 mg/dL — ABNORMAL HIGH (ref 70–99)
Potassium: 4 mmol/L (ref 3.5–5.1)
Sodium: 142 mmol/L (ref 135–145)
Total Bilirubin: 0.3 mg/dL (ref 0.3–1.2)
Total Protein: 7.4 g/dL (ref 6.5–8.1)

## 2021-01-26 LAB — CBC WITH DIFFERENTIAL (CANCER CENTER ONLY)
Abs Immature Granulocytes: 0.01 10*3/uL (ref 0.00–0.07)
Basophils Absolute: 0 10*3/uL (ref 0.0–0.1)
Basophils Relative: 1 %
Eosinophils Absolute: 0 10*3/uL (ref 0.0–0.5)
Eosinophils Relative: 1 %
HCT: 31.2 % — ABNORMAL LOW (ref 36.0–46.0)
Hemoglobin: 10.5 g/dL — ABNORMAL LOW (ref 12.0–15.0)
Immature Granulocytes: 0 %
Lymphocytes Relative: 13 %
Lymphs Abs: 0.5 10*3/uL — ABNORMAL LOW (ref 0.7–4.0)
MCH: 31.7 pg (ref 26.0–34.0)
MCHC: 33.7 g/dL (ref 30.0–36.0)
MCV: 94.3 fL (ref 80.0–100.0)
Monocytes Absolute: 0.7 10*3/uL (ref 0.1–1.0)
Monocytes Relative: 19 %
Neutro Abs: 2.3 10*3/uL (ref 1.7–7.7)
Neutrophils Relative %: 66 %
Platelet Count: 177 10*3/uL (ref 150–400)
RBC: 3.31 MIL/uL — ABNORMAL LOW (ref 3.87–5.11)
RDW: 18.3 % — ABNORMAL HIGH (ref 11.5–15.5)
WBC Count: 3.6 10*3/uL — ABNORMAL LOW (ref 4.0–10.5)
nRBC: 0 % (ref 0.0–0.2)

## 2021-01-26 MED ORDER — SODIUM CHLORIDE 0.9% FLUSH
10.0000 mL | INTRAVENOUS | Status: DC | PRN
  Administered 2021-01-26: 10 mL
  Filled 2021-01-26: qty 10

## 2021-01-26 MED ORDER — SODIUM CHLORIDE 0.9 % IV SOLN
10.0000 mg | Freq: Once | INTRAVENOUS | Status: AC
  Administered 2021-01-26: 10 mg via INTRAVENOUS
  Filled 2021-01-26: qty 10

## 2021-01-26 MED ORDER — FAMOTIDINE 20 MG IN NS 100 ML IVPB
INTRAVENOUS | Status: AC
  Filled 2021-01-26: qty 100

## 2021-01-26 MED ORDER — DIPHENHYDRAMINE HCL 50 MG/ML IJ SOLN
25.0000 mg | Freq: Once | INTRAMUSCULAR | Status: AC
  Administered 2021-01-26: 25 mg via INTRAVENOUS

## 2021-01-26 MED ORDER — SODIUM CHLORIDE 0.9% FLUSH
10.0000 mL | Freq: Once | INTRAVENOUS | Status: AC
  Administered 2021-01-26: 10 mL
  Filled 2021-01-26: qty 10

## 2021-01-26 MED ORDER — HEPARIN SOD (PORK) LOCK FLUSH 100 UNIT/ML IV SOLN
500.0000 [IU] | Freq: Once | INTRAVENOUS | Status: AC | PRN
  Administered 2021-01-26: 500 [IU]
  Filled 2021-01-26: qty 5

## 2021-01-26 MED ORDER — SODIUM CHLORIDE 0.9 % IV SOLN
80.0000 mg/m2 | Freq: Once | INTRAVENOUS | Status: AC
  Administered 2021-01-26: 168 mg via INTRAVENOUS
  Filled 2021-01-26: qty 28

## 2021-01-26 MED ORDER — FAMOTIDINE 20 MG IN NS 100 ML IVPB
20.0000 mg | Freq: Once | INTRAVENOUS | Status: AC
  Administered 2021-01-26: 20 mg via INTRAVENOUS

## 2021-01-26 MED ORDER — SODIUM CHLORIDE 0.9 % IV SOLN
Freq: Once | INTRAVENOUS | Status: AC
  Filled 2021-01-26: qty 250

## 2021-01-26 MED ORDER — DIPHENHYDRAMINE HCL 50 MG/ML IJ SOLN
INTRAMUSCULAR | Status: AC
  Filled 2021-01-26: qty 1

## 2021-01-26 NOTE — Patient Instructions (Signed)
[facility]   Discharge Instructions: Thank you for choosing [facility] to provide your oncology and hematology care.   If you have a lab appointment with the Cancer Center, please go directly to the Cancer Center and check in at the registration area.   Wear comfortable clothing and clothing appropriate for easy access to any Portacath or PICC line.   We strive to give you quality time with your provider. You may need to reschedule your appointment if you arrive late (15 or more minutes).  Arriving late affects you and other patients whose appointments are after yours.  Also, if you miss three or more appointments without notifying the office, you may be dismissed from the clinic at the provider's discretion.      For prescription refill requests, have your pharmacy contact our office and allow 72 hours for refills to be completed.    Today you received the following chemotherapy and/or immunotherapy agents: paclitaxel.      To help prevent nausea and vomiting after your treatment, we encourage you to take your nausea medication as directed.  BELOW ARE SYMPTOMS THAT SHOULD BE REPORTED IMMEDIATELY: *FEVER GREATER THAN 100.4 F (38 C) OR HIGHER *CHILLS OR SWEATING *NAUSEA AND VOMITING THAT IS NOT CONTROLLED WITH YOUR NAUSEA MEDICATION *UNUSUAL SHORTNESS OF BREATH *UNUSUAL BRUISING OR BLEEDING *URINARY PROBLEMS (pain or burning when urinating, or frequent urination) *BOWEL PROBLEMS (unusual diarrhea, constipation, pain near the anus) TENDERNESS IN MOUTH AND THROAT WITH OR WITHOUT PRESENCE OF ULCERS (sore throat, sores in mouth, or a toothache) UNUSUAL RASH, SWELLING OR PAIN  UNUSUAL VAGINAL DISCHARGE OR ITCHING   Items with * indicate a potential emergency and should be followed up as soon as possible or go to the Emergency Department if any problems should occur.  Please show the CHEMOTHERAPY ALERT CARD or IMMUNOTHERAPY ALERT CARD at check-in  to the Emergency Department and triage nurse.  Should you have questions after your visit or need to cancel or reschedule your appointment, please contact [facility]  Dept: [phone]  and follow the prompts.  Office hours are 8:00 a.m. to 4:30 p.m. Monday - Friday. Please note that voicemails left after 4:00 p.m. may not be returned until the following business day.  We are closed weekends and major holidays. You have access to a nurse at all times for urgent questions. Please call the main number to the clinic Dept: [phone] and follow the prompts.   For any non-urgent questions, you may also contact your provider using MyChart. We now offer e-Visits for anyone 18 and older to request care online for non-urgent symptoms. For details visit [email or web address].   Also download the MyChart app! Go to the app store, search "MyChart", open the app, select [facility], and log in with your MyChart username and password.  Due to Covid, a mask is required upon entering the hospital/clinic. If you do not have a mask, one will be given to you upon arrival. For doctor visits, patients may have 1 support person aged 18 or older with them. For treatment visits, patients cannot have anyone with them due to current Covid guidelines and our immunocompromised population.   

## 2021-01-29 ENCOUNTER — Inpatient Hospital Stay: Payer: Federal, State, Local not specified - PPO

## 2021-01-29 ENCOUNTER — Other Ambulatory Visit: Payer: Self-pay

## 2021-01-29 VITALS — BP 171/93 | HR 88 | Temp 98.8°F | Resp 20

## 2021-01-29 DIAGNOSIS — C50412 Malignant neoplasm of upper-outer quadrant of left female breast: Secondary | ICD-10-CM

## 2021-01-29 DIAGNOSIS — Z171 Estrogen receptor negative status [ER-]: Secondary | ICD-10-CM

## 2021-01-29 MED ORDER — FILGRASTIM-AAFI 480 MCG/0.8ML IJ SOSY
480.0000 ug | PREFILLED_SYRINGE | Freq: Once | INTRAMUSCULAR | Status: AC
  Administered 2021-01-29: 480 ug via SUBCUTANEOUS

## 2021-01-29 MED ORDER — FILGRASTIM-AAFI 480 MCG/0.8ML IJ SOSY
PREFILLED_SYRINGE | INTRAMUSCULAR | Status: AC
  Filled 2021-01-29: qty 0.8

## 2021-01-29 NOTE — Patient Instructions (Signed)
Filgrastim, G-CSF injection What is this medication? FILGRASTIM, G-CSF (fil GRA stim) is a granulocyte colony-stimulating factor that stimulates the growth of neutrophils, a type of white blood cell (WBC) important in the body's fight against infection. It is used to reduce the incidence of fever and infection in patients with certain types of cancer who are receiving chemotherapy that affects the bone marrow, to stimulate blood cell production for removal of WBCs from the body prior to a bone marrow transplantation, to reduce the incidence of fever and infection in patients who have severe chronic neutropenia, and to improve survival outcomes following high-dose radiation exposure that is toxic to the bone marrow. This medicine may be used for other purposes; ask your health care provider or pharmacist if you have questions. COMMON BRAND NAME(S): Neupogen, Nivestym, Releuko, Zarxio What should I tell my care team before I take this medication? They need to know if you have any of these conditions: kidney disease latex allergy ongoing radiation therapy sickle cell disease an unusual or allergic reaction to filgrastim, pegfilgrastim, other medicines, foods, dyes, or preservatives pregnant or trying to get pregnant breast-feeding How should I use this medication? This medicine is for injection under the skin or infusion into a vein. As an infusion into a vein, it is usually given by a health care professional in a hospital or clinic setting. If you get this medicine at home, you will be taught how to prepare and give this medicine. Refer to the Instructions for Use that come with your medication packaging. Use exactly as directed. Take your medicine at regular intervals. Do not take your medicine more often than directed. It is important that you put your used needles and syringes in a special sharps container. Do not put them in a trash can. If you do not have a sharps container, call your pharmacist  or healthcare provider to get one. Talk to your pediatrician regarding the use of this medicine in children. While this drug may be prescribed for children as young as 7 months for selected conditions, precautions do apply. Overdosage: If you think you have taken too much of this medicine contact a poison control center or emergency room at once. NOTE: This medicine is only for you. Do not share this medicine with others. What if I miss a dose? It is important not to miss your dose. Call your doctor or health care professional if you miss a dose. What may interact with this medication? This medicine may interact with the following medications: medicines that may cause a release of neutrophils, such as lithium This list may not describe all possible interactions. Give your health care provider a list of all the medicines, herbs, non-prescription drugs, or dietary supplements you use. Also tell them if you smoke, drink alcohol, or use illegal drugs. Some items may interact with your medicine. What should I watch for while using this medication? Your condition will be monitored carefully while you are receiving this medicine. You may need blood work done while you are taking this medicine. Talk to your health care provider about your risk of cancer. You may be more at risk for certain types of cancer if you take this medicine. What side effects may I notice from receiving this medication? Side effects that you should report to your doctor or health care professional as soon as possible: allergic reactions like skin rash, itching or hives, swelling of the face, lips, or tongue back pain dizziness or feeling faint fever pain, redness, or   irritation at site where injected pinpoint red spots on the skin shortness of breath or breathing problems signs and symptoms of kidney injury like trouble passing urine, change in the amount of urine, or red or dark-brown urine stomach or side pain, or pain at  the shoulder swelling tiredness unusual bleeding or bruising Side effects that usually do not require medical attention (report to your doctor or health care professional if they continue or are bothersome): bone pain cough diarrhea hair loss headache muscle pain This list may not describe all possible side effects. Call your doctor for medical advice about side effects. You may report side effects to FDA at 1-800-FDA-1088. Where should I keep my medication? Keep out of the reach of children. Store in a refrigerator between 2 and 8 degrees C (36 and 46 degrees F). Do not freeze. Keep in carton to protect from light. Throw away this medicine if vials or syringes are left out of the refrigerator for more than 24 hours. Throw away any unused medicine after the expiration date. NOTE: This sheet is a summary. It may not cover all possible information. If you have questions about this medicine, talk to your doctor, pharmacist, or health care provider.  2022 Elsevier/Gold Standard (2019-07-05 18:47:55)  

## 2021-01-30 ENCOUNTER — Inpatient Hospital Stay: Payer: Federal, State, Local not specified - PPO

## 2021-01-30 VITALS — BP 166/68 | HR 106 | Temp 99.0°F | Resp 18

## 2021-01-30 DIAGNOSIS — C50412 Malignant neoplasm of upper-outer quadrant of left female breast: Secondary | ICD-10-CM

## 2021-01-30 DIAGNOSIS — Z171 Estrogen receptor negative status [ER-]: Secondary | ICD-10-CM

## 2021-01-30 MED ORDER — FILGRASTIM-AAFI 480 MCG/0.8ML IJ SOSY
480.0000 ug | PREFILLED_SYRINGE | Freq: Once | INTRAMUSCULAR | Status: AC
  Administered 2021-01-30: 480 ug via SUBCUTANEOUS

## 2021-01-30 MED ORDER — FILGRASTIM-AAFI 480 MCG/0.8ML IJ SOSY
PREFILLED_SYRINGE | INTRAMUSCULAR | Status: AC
  Filled 2021-01-30: qty 0.8

## 2021-01-30 NOTE — Patient Instructions (Signed)
Filgrastim, G-CSF injection What is this medication? FILGRASTIM, G-CSF (fil GRA stim) is a granulocyte colony-stimulating factor that stimulates the growth of neutrophils, a type of white blood cell (WBC) important in the body's fight against infection. It is used to reduce the incidence of fever and infection in patients with certain types of cancer who are receiving chemotherapy that affects the bone marrow, to stimulate blood cell production for removal of WBCs from the body prior to a bone marrow transplantation, to reduce the incidence of fever and infection in patients who have severe chronic neutropenia, and to improve survival outcomes following high-dose radiation exposure that is toxic to the bone marrow. This medicine may be used for other purposes; ask your health care provider or pharmacist if you have questions. COMMON BRAND NAME(S): Neupogen, Nivestym, Releuko, Zarxio What should I tell my care team before I take this medication? They need to know if you have any of these conditions: kidney disease latex allergy ongoing radiation therapy sickle cell disease an unusual or allergic reaction to filgrastim, pegfilgrastim, other medicines, foods, dyes, or preservatives pregnant or trying to get pregnant breast-feeding How should I use this medication? This medicine is for injection under the skin or infusion into a vein. As an infusion into a vein, it is usually given by a health care professional in a hospital or clinic setting. If you get this medicine at home, you will be taught how to prepare and give this medicine. Refer to the Instructions for Use that come with your medication packaging. Use exactly as directed. Take your medicine at regular intervals. Do not take your medicine more often than directed. It is important that you put your used needles and syringes in a special sharps container. Do not put them in a trash can. If you do not have a sharps container, call your pharmacist  or healthcare provider to get one. Talk to your pediatrician regarding the use of this medicine in children. While this drug may be prescribed for children as young as 7 months for selected conditions, precautions do apply. Overdosage: If you think you have taken too much of this medicine contact a poison control center or emergency room at once. NOTE: This medicine is only for you. Do not share this medicine with others. What if I miss a dose? It is important not to miss your dose. Call your doctor or health care professional if you miss a dose. What may interact with this medication? This medicine may interact with the following medications: medicines that may cause a release of neutrophils, such as lithium This list may not describe all possible interactions. Give your health care provider a list of all the medicines, herbs, non-prescription drugs, or dietary supplements you use. Also tell them if you smoke, drink alcohol, or use illegal drugs. Some items may interact with your medicine. What should I watch for while using this medication? Your condition will be monitored carefully while you are receiving this medicine. You may need blood work done while you are taking this medicine. Talk to your health care provider about your risk of cancer. You may be more at risk for certain types of cancer if you take this medicine. What side effects may I notice from receiving this medication? Side effects that you should report to your doctor or health care professional as soon as possible: allergic reactions like skin rash, itching or hives, swelling of the face, lips, or tongue back pain dizziness or feeling faint fever pain, redness, or   irritation at site where injected pinpoint red spots on the skin shortness of breath or breathing problems signs and symptoms of kidney injury like trouble passing urine, change in the amount of urine, or red or dark-brown urine stomach or side pain, or pain at  the shoulder swelling tiredness unusual bleeding or bruising Side effects that usually do not require medical attention (report to your doctor or health care professional if they continue or are bothersome): bone pain cough diarrhea hair loss headache muscle pain This list may not describe all possible side effects. Call your doctor for medical advice about side effects. You may report side effects to FDA at 1-800-FDA-1088. Where should I keep my medication? Keep out of the reach of children. Store in a refrigerator between 2 and 8 degrees C (36 and 46 degrees F). Do not freeze. Keep in carton to protect from light. Throw away this medicine if vials or syringes are left out of the refrigerator for more than 24 hours. Throw away any unused medicine after the expiration date. NOTE: This sheet is a summary. It may not cover all possible information. If you have questions about this medicine, talk to your doctor, pharmacist, or health care provider.  2022 Elsevier/Gold Standard (2019-07-05 18:47:55)  

## 2021-01-31 ENCOUNTER — Other Ambulatory Visit: Payer: Self-pay

## 2021-01-31 ENCOUNTER — Inpatient Hospital Stay: Payer: Federal, State, Local not specified - PPO

## 2021-01-31 VITALS — BP 140/77 | HR 99 | Temp 98.9°F | Resp 20

## 2021-01-31 DIAGNOSIS — C50412 Malignant neoplasm of upper-outer quadrant of left female breast: Secondary | ICD-10-CM | POA: Diagnosis not present

## 2021-01-31 MED ORDER — FILGRASTIM-AAFI 480 MCG/0.8ML IJ SOSY
480.0000 ug | PREFILLED_SYRINGE | Freq: Once | INTRAMUSCULAR | Status: AC
  Administered 2021-01-31: 480 ug via SUBCUTANEOUS

## 2021-01-31 MED ORDER — FILGRASTIM-AAFI 480 MCG/0.8ML IJ SOSY
PREFILLED_SYRINGE | INTRAMUSCULAR | Status: AC
  Filled 2021-01-31: qty 0.8

## 2021-02-02 ENCOUNTER — Other Ambulatory Visit: Payer: Federal, State, Local not specified - PPO

## 2021-02-02 ENCOUNTER — Ambulatory Visit: Payer: Federal, State, Local not specified - PPO | Admitting: Hematology and Oncology

## 2021-02-02 NOTE — Assessment & Plan Note (Signed)
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 Neoadjuvant chemotherapy with Adriamycin and Cytoxan dose dense 4 followed by Taxol weekly 12 with carboplatin every 3 weeks x4(pembrolizumab will be added if she is triple negative) 2. Followed bybreast conserving surgery versusmastectomy andaxillary lymph node dissection (due to matted axillary lymph nodes) 3. Followed by adjuvant radiation therapy PET CT scan: Known breast cancer and lymphadenopathy in the axilla, supraclavicular, pectoral, internal mammary lymph nodes, no distant metastatic disease -------------------------------------------------------------------------- Current treatment:Completed 4 cycles ofneoadjuvant Adriamycin Cytoxan and Pembrolizumab, today is cycle 4 Taxol, carbo and [place]  Toxicities: Denies any nausea or vomiting. 1.Fatigue 2.Alopecia  Monitoring closely for peripheral neuropathy  Monitoring closely for immunotherapy related adverse effects.:monitoringTSH.

## 2021-02-02 NOTE — Progress Notes (Signed)
Patient Care Team: Riki Sheer, NP as PCP - General (Nurse Practitioner) Mauro Kaufmann, RN as Oncology Nurse Navigator Rockwell [place], RN as Oncology Nurse Navigator Rolm Bookbinder, MD as Consulting Physician (General Surgery) Nicholas Lose, MD as Consulting Physician (Hematology and Oncology) Kyung Rudd, MD as Consulting Physician (Radiation Oncology)  DIAGNOSIS:    ICD-10-CM   1. Malignant neoplasm of upper-outer quadrant of left breast in female, estrogen receptor negative ([place])  C50.412    Z17.1       SUMMARY OF ONCOLOGIC HISTORY: Oncology History  Malignant neoplasm of upper-outer quadrant of left breast in female, estrogen receptor negative ([place])  09/30/2020 Initial Diagnosis   Swollen left breast: Mammogram detected left breast mass UOQ skin thickening with 3 abnormal lymph nodes that are matted and hard, ultrasound 2.7 cm, 2.9 cm, 3 cm and 3 lymph nodes biopsy revealed grade 2-3 IDC with DCIS ER/PR negative HER-2 IHC equivocal FISH pending, Ki-67 60%, lymph node biopsy positive   10/01/2020 Cancer Staging   Staging form: Breast, AJCC 8th Edition - Clinical stage from 10/01/2020: Stage IIIC (cT4b, cN2a, cM0, G3, ER-, PR-, HER2: Equivocal) - Signed by Nicholas Lose, MD on 10/01/2020  Stage prefix: Initial diagnosis  Histologic grading system: 3 grade system    10/13/2020 Genetic Testing   Negative hereditary cancer genetic testing: no pathogenic variants detected in [facility] CustomNext-Cancer +RNAinsight Panel.  The report date is October 13, 2020.   The CustomNext-Cancer+RNAinsight panel offered by [facility] includes sequencing and rearrangement analysis for the following 47 genes:  APC, ATM, AXIN2, BARD1, BMPR1A, BRCA1, BRCA2, BRIP1, CDH1, CDK4, CDKN2A, CHEK2, DICER1, EPCAM, GREM1, HOXB13, MEN1, MLH1, MSH2, MSH3, MSH6, MUTYH, NBN, NF1, NF2, NTHL1, PALB2, PMS2, POLD1, POLE, PTEN, RAD51C, RAD51D, RECQL, RET, SDHA, SDHAF2, SDHB, SDHC, SDHD, SMAD4, SMARCA4, STK11,  TP53, TSC1, TSC2, and VHL.  RNA data is routinely analyzed for use in variant interpretation for all genes.   01/13/2021 -  Chemotherapy    Patient is on Treatment Plan: BREAST PEMBROLIZUMAB + AC Q21D X 4 CYCLES / PEMBROLIZUMAB + CARBOPLATIN D1 + PACLITAXEL D1,8,15 Q21D X 4 CYCLES         CHIEF COMPLIANT: Cycle 4 Taxol Carboplatin and Keytruda  INTERVAL HISTORY: Rita Cole is a 60 y.o. with above-mentioned history of left breast cancer currently on neoadjuvant chemotherapy with Taxol Carboplatin and Keytruda. She presents to the clinic for cycle 4 Taxol being administered with Keytruda and carboplatin today.  She complains of fatigue.  She denies peripheral neuropathy.  Her blood pressure is running fairly high and she has a slight headache.  She tells me that her blood pressure runs high in the clinic.  ALLERGIES:  has No Known Allergies.  MEDICATIONS:  Current Outpatient Medications  Medication Sig Dispense Refill   benazepril (LOTENSIN) 40 MG tablet Take 40 mg by mouth daily.     cyclobenzaprine (FLEXERIL) 10 MG tablet Take 10 mg by mouth 3 (three) times daily as needed.     hydrochlorothiazide (HYDRODIURIL) 25 MG tablet Take 1 tablet (25 mg total) by mouth daily. 30 tablet 0   ketorolac (TORADOL) 10 MG tablet Take 1 tablet (10 mg total) by mouth every 6 (six) hours as needed. 20 tablet 0   lidocaine-prilocaine (EMLA) cream Apply to affected area once 30 g 3   meloxicam (MOBIC) 15 MG tablet Take 1 tablet (15 mg total) by mouth daily. 10 tablet 0   ondansetron (ZOFRAN) 8 MG tablet Take 1 tablet (8 mg total)  by mouth 2 (two) times daily as needed. Start on the third day after carboplatin and AC chemotherapy. 30 tablet 1   prochlorperazine (COMPAZINE) 10 MG tablet Take 1 tablet (10 mg total) by mouth every 6 (six) hours as needed (Nausea or vomiting). 30 tablet 1   tiZANidine (ZANAFLEX) 4 MG tablet Take 4 mg by mouth 3 (three) times daily as needed.     Vitamin D,  Ergocalciferol, (DRISDOL) 1.25 MG (50000 UNIT) CAPS capsule Take 50,000 Units by mouth once a week. Monday     No current facility-administered medications for this visit.    PHYSICAL EXAMINATION: ECOG PERFORMANCE STATUS: 1 - Symptomatic but completely ambulatory  Vitals:   02/03/21 0817  BP: (!) 183/87  Pulse: 96  Resp: 18  Temp: 97.8 F (36.6 C)  SpO2: 100%   Filed Weights   02/03/21 0817  Weight: 211 lb 3.2 oz (95.8 kg)    LABORATORY DATA:  I have reviewed the data as listed CMP Latest Ref Rng & Units 02/03/2021 01/26/2021 01/19/2021  Glucose 70 - 99 mg/dL 116(H) 100(H) 104(H)  BUN 6 - 20 mg/dL _0 Creatinine 0.44 - 1.00 mg/dL 0.93 0.79 0.79  Sodium 135 - 145 mmol/L 142 142 139  Potassium 3.5 - 5.1 mmol/L 3.6 4.0 3.7  Chloride 98 - 111 mmol/L 103 105 103  CO2 22 - 32 mmol/L _1 Calcium 8.9 - 10.3 mg/dL 10.0 10.0 9.9  Total Protein 6.5 - 8.1 g/dL 7.8 7.4 7.5  Total Bilirubin 0.3 - 1.2 mg/dL 0.3 0.3 0.3  Alkaline Phos 38 - 126 U/L 107 72 68  AST 15 - 41 U/L _2 ALT 0 - 44 U/L _3 Lab Results  Component Value Date   WBC 5.1 02/03/2021   HGB 11.4 (L) 02/03/2021   HCT 34.6 (L) 02/03/2021   MCV 97.2 02/03/2021   PLT 75 (L) 02/03/2021   NEUTROABS 3.4 02/03/2021    ASSESSMENT & PLAN:  Malignant neoplasm of upper-outer quadrant of left breast in female, estrogen receptor negative ([place]) 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 Neoadjuvant chemotherapy with Adriamycin and Cytoxan dose dense 4 followed by Taxol weekly 12 with carboplatin every 3 weeks x4 (pembrolizumab will be added if she is triple negative) 2. Followed by breast conserving  surgery versus mastectomy and axillary lymph node dissection (due to matted axillary lymph nodes) 3. Followed by adjuvant radiation therapy PET CT scan: Known breast cancer and lymphadenopathy in the axilla, supraclavicular, pectoral, internal mammary lymph nodes, no distant metastatic disease -------------------------------------------------------------------------- Current treatment: Completed 4 cycles of neoadjuvant Adriamycin Cytoxan and Pembrolizumab, today is cycle 4 Taxol, carbo and keytruda (holding carboplatin for platelets of 75)   Toxicities: Denies any nausea or vomiting. 1. Fatigue 2. Alopecia 3.  Hypertension: Monitoring closely. 4.  Chemotherapy-induced anemia 5.  Thrombocytopenia: Platelet count 75: We will hold off on giving carboplatin today.  But we will proceed with giving her Taxol and Keytruda. If her platelets continue to drop then we may have to think about holding further treatment if necessary.  Patient understands this and is agreeable to proceed.   Monitoring closely for  peripheral neuropathy   Monitoring closely for immunotherapy related adverse effects.:  monitoring TSH.  Return to clinic weekly for chemo.  No orders of the defined types were placed in this encounter.  The patient has a good understanding of the overall plan. she agrees with it. she will call with any problems that may develop before the next visit here.  Total time spent: 30 mins including face to face time and time spent for planning, charting and coordination of care  Rulon Eisenmenger, MD, MPH 02/03/2021  I, Thana Ates, am acting as scribe for Dr. Nicholas Lose.  I have reviewed the above documentation for accuracy and completeness, and I agree with the above.

## 2021-02-03 ENCOUNTER — Inpatient Hospital Stay: Payer: Federal, State, Local not specified - PPO

## 2021-02-03 ENCOUNTER — Inpatient Hospital Stay: Payer: Federal, State, Local not specified - PPO | Admitting: Hematology and Oncology

## 2021-02-03 ENCOUNTER — Other Ambulatory Visit: Payer: Self-pay

## 2021-02-03 VITALS — BP 182/99 | HR 89

## 2021-02-03 DIAGNOSIS — C50412 Malignant neoplasm of upper-outer quadrant of left female breast: Secondary | ICD-10-CM | POA: Diagnosis not present

## 2021-02-03 DIAGNOSIS — Z171 Estrogen receptor negative status [ER-]: Secondary | ICD-10-CM

## 2021-02-03 DIAGNOSIS — Z95828 Presence of other vascular implants and grafts: Secondary | ICD-10-CM

## 2021-02-03 LAB — CMP (CANCER CENTER ONLY)
ALT: 24 U/L (ref 0–44)
AST: 22 U/L (ref 15–41)
Albumin: 4 g/dL (ref 3.5–5.0)
Alkaline Phosphatase: 107 U/L (ref 38–126)
Anion gap: 12 (ref 5–15)
BUN: 10 mg/dL (ref 6–20)
CO2: 27 mmol/L (ref 22–32)
Calcium: 10 mg/dL (ref 8.9–10.3)
Chloride: 103 mmol/L (ref 98–111)
Creatinine: 0.93 mg/dL (ref 0.44–1.00)
GFR, Estimated: >60 mL/min (ref >60)
Glucose, Bld: 116 mg/dL — ABNORMAL HIGH (ref 70–99)
Potassium: 3.6 mmol/L (ref 3.5–5.1)
Sodium: 142 mmol/L (ref 135–145)
Total Bilirubin: 0.3 mg/dL (ref 0.3–1.2)
Total Protein: 7.8 g/dL (ref 6.5–8.1)

## 2021-02-03 LAB — CBC WITH DIFFERENTIAL (CANCER CENTER ONLY)
Abs Immature Granulocytes: 0.18 10*3/uL — ABNORMAL HIGH (ref 0.00–0.07)
Basophils Absolute: 0.1 10*3/uL (ref 0.0–0.1)
Basophils Relative: 1 %
Eosinophils Absolute: 0.1 10*3/uL (ref 0.0–0.5)
Eosinophils Relative: 1 %
HCT: 34.6 % — ABNORMAL LOW (ref 36.0–46.0)
Hemoglobin: 11.4 g/dL — ABNORMAL LOW (ref 12.0–15.0)
Immature Granulocytes: 4 %
Lymphocytes Relative: 13 %
Lymphs Abs: 0.6 10*3/uL — ABNORMAL LOW (ref 0.7–4.0)
MCH: 32 pg (ref 26.0–34.0)
MCHC: 32.9 g/dL (ref 30.0–36.0)
MCV: 97.2 fL (ref 80.0–100.0)
Monocytes Absolute: 0.8 10*3/uL (ref 0.1–1.0)
Monocytes Relative: 16 %
Neutro Abs: 3.4 10*3/uL (ref 1.7–7.7)
Neutrophils Relative %: 65 %
Platelet Count: 75 10*3/uL — ABNORMAL LOW (ref 150–400)
RBC: 3.56 MIL/uL — ABNORMAL LOW (ref 3.87–5.11)
RDW: 18.4 % — ABNORMAL HIGH (ref 11.5–15.5)
WBC Count: 5.1 10*3/uL (ref 4.0–10.5)
nRBC: 0 % (ref 0.0–0.2)

## 2021-02-03 LAB — TSH: TSH: 4.655 u[IU]/mL — ABNORMAL HIGH (ref 0.308–3.960)

## 2021-02-03 MED ORDER — HEPARIN SOD (PORK) LOCK FLUSH 100 UNIT/ML IV SOLN
500.0000 [IU] | Freq: Once | INTRAVENOUS | Status: AC | PRN
  Administered 2021-02-03: 500 [IU]
  Filled 2021-02-03: qty 5

## 2021-02-03 MED ORDER — SODIUM CHLORIDE 0.9 % IV SOLN
80.0000 mg/m2 | Freq: Once | INTRAVENOUS | Status: AC
  Administered 2021-02-03: 168 mg via INTRAVENOUS
  Filled 2021-02-03: qty 28

## 2021-02-03 MED ORDER — SODIUM CHLORIDE 0.9% FLUSH
10.0000 mL | INTRAVENOUS | Status: DC | PRN
  Administered 2021-02-03: 10 mL
  Filled 2021-02-03: qty 10

## 2021-02-03 MED ORDER — FAMOTIDINE 20 MG IN NS 100 ML IVPB
20.0000 mg | Freq: Once | INTRAVENOUS | Status: AC
Start: 2021-02-03 — End: 2021-02-03
  Administered 2021-02-03: 20 mg via INTRAVENOUS

## 2021-02-03 MED ORDER — DIPHENHYDRAMINE HCL 50 MG/ML IJ SOLN
25.0000 mg | Freq: Once | INTRAMUSCULAR | Status: AC
  Administered 2021-02-03: 25 mg via INTRAVENOUS

## 2021-02-03 MED ORDER — DIPHENHYDRAMINE HCL 50 MG/ML IJ SOLN
INTRAMUSCULAR | Status: AC
  Filled 2021-02-03: qty 1

## 2021-02-03 MED ORDER — SODIUM CHLORIDE 0.9% FLUSH
10.0000 mL | Freq: Once | INTRAVENOUS | Status: AC
  Administered 2021-02-03: 10 mL
  Filled 2021-02-03: qty 10

## 2021-02-03 MED ORDER — FAMOTIDINE 20 MG IN NS 100 ML IVPB
INTRAVENOUS | Status: AC
  Filled 2021-02-03: qty 100

## 2021-02-03 MED ORDER — PALONOSETRON HCL INJECTION 0.25 MG/5ML
INTRAVENOUS | Status: AC
  Filled 2021-02-03: qty 5

## 2021-02-03 MED ORDER — SODIUM CHLORIDE 0.9 % IV SOLN
Freq: Once | INTRAVENOUS | Status: AC
  Filled 2021-02-03: qty 250

## 2021-02-03 MED ORDER — PALONOSETRON HCL INJECTION 0.25 MG/5ML
0.2500 mg | Freq: Once | INTRAVENOUS | Status: AC
  Administered 2021-02-03: 0.25 mg via INTRAVENOUS

## 2021-02-03 MED ORDER — SODIUM CHLORIDE 0.9 % IV SOLN
200.0000 mg | Freq: Once | INTRAVENOUS | Status: AC
  Administered 2021-02-03: 200 mg via INTRAVENOUS
  Filled 2021-02-03: qty 8

## 2021-02-03 MED ORDER — SODIUM CHLORIDE 0.9 % IV SOLN
10.0000 mg | Freq: Once | INTRAVENOUS | Status: AC
  Administered 2021-02-03: 10 mg via INTRAVENOUS
  Filled 2021-02-03: qty 10

## 2021-02-03 MED ORDER — SODIUM CHLORIDE 0.9 % IV SOLN
150.0000 mg | Freq: Once | INTRAVENOUS | Status: DC
  Filled 2021-02-03: qty 5

## 2021-02-03 NOTE — Progress Notes (Signed)
Per Dr. Geralyn Flash office note from today, okay to proceed with Keytruda and Taxol with PLT 75 and elevated BP.  Holding Carboplatin.

## 2021-02-03 NOTE — Progress Notes (Signed)
Ok to hold [place] today since pt not receiving [place]

## 2021-02-03 NOTE — Patient Instructions (Signed)
[facility]  Discharge Instructions: Thank you for choosing [facility] to provide your oncology and hematology care.   If you have a lab appointment with the [facility], please go directly to the [facility] and check in at the registration area.   Wear comfortable clothing and clothing appropriate for easy access to any Portacath or PICC line.   We strive to give you quality time with your provider. You may need to reschedule your appointment if you arrive late (15 or more minutes).  Arriving late affects you and other patients whose appointments are after yours.  Also, if you miss three or more appointments without notifying the office, you may be dismissed from the clinic at the provider's discretion.      For prescription refill requests, have your pharmacy contact our office and allow 72 hours for refills to be completed.    Today you received the following chemotherapy and/or immunotherapy agents: Keytruda/Taxol.      To help prevent nausea and vomiting after your treatment, we encourage you to take your nausea medication as directed.  BELOW ARE SYMPTOMS THAT SHOULD BE REPORTED IMMEDIATELY: *FEVER GREATER THAN 100.4 F (38 C) OR HIGHER *CHILLS OR SWEATING *NAUSEA AND VOMITING THAT IS NOT CONTROLLED WITH YOUR NAUSEA MEDICATION *UNUSUAL SHORTNESS OF BREATH *UNUSUAL BRUISING OR BLEEDING *URINARY PROBLEMS (pain or burning when urinating, or frequent urination) *BOWEL PROBLEMS (unusual diarrhea, constipation, pain near the anus) TENDERNESS IN MOUTH AND THROAT WITH OR WITHOUT PRESENCE OF ULCERS (sore throat, sores in mouth, or a toothache) UNUSUAL RASH, SWELLING OR PAIN  UNUSUAL VAGINAL DISCHARGE OR ITCHING   Items with * indicate a potential emergency and should be followed up as soon as possible or go to the Emergency Department if any problems should occur.  Please show the CHEMOTHERAPY ALERT CARD or IMMUNOTHERAPY ALERT CARD at  check-in to the Emergency Department and triage nurse.  Should you have questions after your visit or need to cancel or reschedule your appointment, please contact [facility]  Dept: [phone]  and follow the prompts.  Office hours are 8:00 a.m. to 4:30 p.m. Monday - Friday. Please note that voicemails left after 4:00 p.m. may not be returned until the following business day.  We are closed weekends and major holidays. You have access to a nurse at all times for urgent questions. Please call the main number to the clinic Dept: [phone] and follow the prompts.   For any non-urgent questions, you may also contact your provider using MyChart. We now offer e-Visits for anyone 63 and older to request care online for non-urgent symptoms. For details visit [email or web address].   Also download the MyChart app! Go to the app store, search "MyChart", open the app, select [facility], and log in with your MyChart username and password.  Due to Covid, a mask is required upon entering the hospital/clinic. If you do not have a mask, one will be given to you upon arrival. For doctor visits, patients may have 1 support person aged 67 or older with them. For treatment visits, patients cannot have anyone with them due to current Covid guidelines and our immunocompromised population.

## 2021-02-04 LAB — T4: T4, Total: 9.7 ug/dL (ref 4.5–12.0)

## 2021-02-08 NOTE — Progress Notes (Signed)
Patient Care Team: Riki Sheer, NP as PCP - General (Nurse Practitioner) Mauro Kaufmann, RN as Oncology Nurse Navigator Rockwell [place], RN as Oncology Nurse Navigator Rolm Bookbinder, MD as Consulting Physician (General Surgery) Nicholas Lose, MD as Consulting Physician (Hematology and Oncology) Kyung Rudd, MD as Consulting Physician (Radiation Oncology)  DIAGNOSIS:    ICD-10-CM   1. Malignant neoplasm of upper-outer quadrant of left breast in female, estrogen receptor negative ([place])  C50.412    Z17.1       SUMMARY OF ONCOLOGIC HISTORY: Oncology History  Malignant neoplasm of upper-outer quadrant of left breast in female, estrogen receptor negative ([place])  09/30/2020 Initial Diagnosis   Swollen left breast: Mammogram detected left breast mass UOQ skin thickening with 3 abnormal lymph nodes that are matted and hard, ultrasound 2.7 cm, 2.9 cm, 3 cm and 3 lymph nodes biopsy revealed grade 2-3 IDC with DCIS ER/PR negative HER-2 IHC equivocal FISH pending, Ki-67 60%, lymph node biopsy positive   10/01/2020 Cancer Staging   Staging form: Breast, AJCC 8th Edition - Clinical stage from 10/01/2020: Stage IIIC (cT4b, cN2a, cM0, G3, ER-, PR-, HER2: Equivocal) - Signed by Nicholas Lose, MD on 10/01/2020 Stage prefix: Initial diagnosis Histologic grading system: 3 grade system   10/13/2020 Genetic Testing   Negative hereditary cancer genetic testing: no pathogenic variants detected in [facility] CustomNext-Cancer +RNAinsight Panel.  The report date is October 13, 2020.   The CustomNext-Cancer+RNAinsight panel offered by [facility] includes sequencing and rearrangement analysis for the following 47 genes:  APC, ATM, AXIN2, BARD1, BMPR1A, BRCA1, BRCA2, BRIP1, CDH1, CDK4, CDKN2A, CHEK2, DICER1, EPCAM, GREM1, HOXB13, MEN1, MLH1, MSH2, MSH3, MSH6, MUTYH, NBN, NF1, NF2, NTHL1, PALB2, PMS2, POLD1, POLE, PTEN, RAD51C, RAD51D, RECQL, RET, SDHA, SDHAF2, SDHB, SDHC, SDHD, SMAD4, SMARCA4, STK11, TP53,  TSC1, TSC2, and VHL.  RNA data is routinely analyzed for use in variant interpretation for all genes.   01/13/2021 -  Chemotherapy    Patient is on Treatment Plan: BREAST PEMBROLIZUMAB + AC Q21D X 4 CYCLES / PEMBROLIZUMAB + CARBOPLATIN D1 + PACLITAXEL D1,8,15 Q21D X 4 CYCLES         CHIEF COMPLIANT: Taxol (holding because of thrombocytopenia)  INTERVAL HISTORY: Rita Cole is a 60 y.o. with above-mentioned history of left breast cancer currently on neoadjuvant chemotherapy with Taxol Carboplatin and Keytruda. She presents to the clinic for Taxol.  Her platelets today are only 39 and therefore she cannot receive chemotherapy today.  Her white count and hemoglobin have also come down.  She does not feel too badly.  She has excellent energy levels.  She is eating well.  She is hydrating and doing everything she can.  ALLERGIES:  has No Known Allergies.  MEDICATIONS:  Current Outpatient Medications  Medication Sig Dispense Refill   benazepril (LOTENSIN) 40 MG tablet Take 40 mg by mouth daily.     cyclobenzaprine (FLEXERIL) 10 MG tablet Take 10 mg by mouth 3 (three) times daily as needed.     hydrochlorothiazide (HYDRODIURIL) 25 MG tablet Take 1 tablet (25 mg total) by mouth daily. 30 tablet 0   ketorolac (TORADOL) 10 MG tablet Take 1 tablet (10 mg total) by mouth every 6 (six) hours as needed. 20 tablet 0   lidocaine-prilocaine (EMLA) cream Apply to affected area once 30 g 3   meloxicam (MOBIC) 15 MG tablet Take 1 tablet (15 mg total) by mouth daily. 10 tablet 0   ondansetron (ZOFRAN) 8 MG tablet Take 1 tablet (8 mg  total) by mouth 2 (two) times daily as needed. Start on the third day after carboplatin and AC chemotherapy. 30 tablet 1   prochlorperazine (COMPAZINE) 10 MG tablet Take 1 tablet (10 mg total) by mouth every 6 (six) hours as needed (Nausea or vomiting). 30 tablet 1   tiZANidine (ZANAFLEX) 4 MG tablet Take 4 mg by mouth 3 (three) times daily as needed.     Vitamin D,  Ergocalciferol, (DRISDOL) 1.25 MG (50000 UNIT) CAPS capsule Take 50,000 Units by mouth once a week. Monday     No current facility-administered medications for this visit.    PHYSICAL EXAMINATION: ECOG PERFORMANCE STATUS: 1 - Symptomatic but completely ambulatory  Vitals:   02/09/21 0929  BP: (!) 154/87  Pulse: 95  Resp: 18  Temp: 97.8 F (36.6 C)  SpO2: 100%   Filed Weights   02/09/21 0929  Weight: 211 lb 14.4 oz (96.1 kg)    LABORATORY DATA:  I have reviewed the data as listed CMP Latest Ref Rng & Units 02/09/2021 02/03/2021 01/26/2021  Glucose 70 - 99 mg/dL 111(H) 116(H) 100(H)  BUN 6 - 20 mg/dL _0 Creatinine 0.44 - 1.00 mg/dL 0.81 0.93 0.79  Sodium 135 - 145 mmol/L 140 142 142  Potassium 3.5 - 5.1 mmol/L 3.7 3.6 4.0  Chloride 98 - 111 mmol/L 101 103 105  CO2 22 - 32 mmol/L _1 Calcium 8.9 - 10.3 mg/dL 9.9 10.0 10.0  Total Protein 6.5 - 8.1 g/dL 7.2 7.8 7.4  Total Bilirubin 0.3 - 1.2 mg/dL 0.5 0.3 0.3  Alkaline Phos 38 - 126 U/L 70 107 72  AST 15 - 41 U/L _2 ALT 0 - 44 U/L _3 Lab Results  Component Value Date   WBC 2.0 (L) 02/09/2021   HGB 10.3 (L) 02/09/2021   HCT 30.4 (L) 02/09/2021   MCV 95.3 02/09/2021   PLT 39 (L) 02/09/2021   NEUTROABS PENDING 02/09/2021    ASSESSMENT & PLAN:  Malignant neoplasm of upper-outer quadrant of left breast in female, estrogen receptor negative ([place]) 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 Neoadjuvant chemotherapy with Adriamycin and Cytoxan dose dense 4 followed by Taxol weekly 12 with carboplatin every 3 weeks x4 (pembrolizumab will be added if she is triple negative) 2. Followed by breast  conserving surgery versus mastectomy and axillary lymph node dissection (due to matted axillary lymph nodes) 3. Followed by adjuvant radiation therapy PET CT scan: Known breast cancer and lymphadenopathy in the axilla, supraclavicular, pectoral, internal mammary lymph nodes, no distant metastatic disease -------------------------------------------------------------------------- Current treatment: Completed 4 cycles of neoadjuvant Adriamycin Cytoxan and Pembrolizumab, today is cycle 5 Taxol, carbo and [place] (held carboplatin last week for platelets of 75)   Toxicities: Denies any nausea or vomiting. 1. Fatigue 2. Alopecia 3.  Hypertension: Monitoring closely. 4.  Chemotherapy-induced anemia 5.  Thrombocytopenia: Platelet count 39: We therefore will hold off on doing any further chemotherapy until the platelet count recovers. I discussed with her that it might take several weeks for the platelets to come up sometimes. Monitoring closely for immunotherapy related adverse effects.:  monitoring TSH.   Return to clinic  next week to see if she can receive treatment.    No orders of the defined types were placed in this encounter.  The patient has a good understanding of the overall plan. she agrees with it. she will call with any problems that may develop before the next visit here.  Total time spent: 30 mins including face to face time and time spent for planning, charting and coordination of care  Rulon Eisenmenger, MD, MPH 02/09/2021  I, Thana Ates, am acting as scribe for Dr. Nicholas Lose.  I have reviewed the above documentation for accuracy and completeness, and I agree with the above.

## 2021-02-09 ENCOUNTER — Inpatient Hospital Stay: Payer: Federal, State, Local not specified - PPO

## 2021-02-09 ENCOUNTER — Other Ambulatory Visit: Payer: Self-pay

## 2021-02-09 ENCOUNTER — Inpatient Hospital Stay: Payer: Federal, State, Local not specified - PPO | Admitting: Hematology and Oncology

## 2021-02-09 DIAGNOSIS — C50412 Malignant neoplasm of upper-outer quadrant of left female breast: Secondary | ICD-10-CM | POA: Diagnosis not present

## 2021-02-09 DIAGNOSIS — Z171 Estrogen receptor negative status [ER-]: Secondary | ICD-10-CM | POA: Diagnosis not present

## 2021-02-09 LAB — CBC WITH DIFFERENTIAL (CANCER CENTER ONLY)
Abs Immature Granulocytes: 0.01 10*3/uL (ref 0.00–0.07)
Basophils Absolute: 0 10*3/uL (ref 0.0–0.1)
Basophils Relative: 1 %
Eosinophils Absolute: 0 10*3/uL (ref 0.0–0.5)
Eosinophils Relative: 2 %
HCT: 30.4 % — ABNORMAL LOW (ref 36.0–46.0)
Hemoglobin: 10.3 g/dL — ABNORMAL LOW (ref 12.0–15.0)
Immature Granulocytes: 1 %
Lymphocytes Relative: 22 %
Lymphs Abs: 0.4 10*3/uL — ABNORMAL LOW (ref 0.7–4.0)
MCH: 32.3 pg (ref 26.0–34.0)
MCHC: 33.9 g/dL (ref 30.0–36.0)
MCV: 95.3 fL (ref 80.0–100.0)
Monocytes Absolute: 0.1 10*3/uL (ref 0.1–1.0)
Monocytes Relative: 5 %
Neutro Abs: 1.4 10*3/uL — ABNORMAL LOW (ref 1.7–7.7)
Neutrophils Relative %: 69 %
Platelet Count: 39 10*3/uL — ABNORMAL LOW (ref 150–400)
RBC: 3.19 MIL/uL — ABNORMAL LOW (ref 3.87–5.11)
RDW: 16.9 % — ABNORMAL HIGH (ref 11.5–15.5)
WBC Count: 2 10*3/uL — ABNORMAL LOW (ref 4.0–10.5)
nRBC: 0 % (ref 0.0–0.2)

## 2021-02-09 LAB — CMP (CANCER CENTER ONLY)
ALT: 23 U/L (ref 0–44)
AST: 19 U/L (ref 15–41)
Albumin: 3.9 g/dL (ref 3.5–5.0)
Alkaline Phosphatase: 70 U/L (ref 38–126)
Anion gap: 10 (ref 5–15)
BUN: 16 mg/dL (ref 6–20)
CO2: 29 mmol/L (ref 22–32)
Calcium: 9.9 mg/dL (ref 8.9–10.3)
Chloride: 101 mmol/L (ref 98–111)
Creatinine: 0.81 mg/dL (ref 0.44–1.00)
GFR, Estimated: >60 mL/min (ref >60)
Glucose, Bld: 111 mg/dL — ABNORMAL HIGH (ref 70–99)
Potassium: 3.7 mmol/L (ref 3.5–5.1)
Sodium: 140 mmol/L (ref 135–145)
Total Bilirubin: 0.5 mg/dL (ref 0.3–1.2)
Total Protein: 7.2 g/dL (ref 6.5–8.1)

## 2021-02-09 LAB — TSH: TSH: 2.649 u[IU]/mL (ref 0.308–3.960)

## 2021-02-09 NOTE — Assessment & Plan Note (Signed)
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 Neoadjuvant chemotherapy with Adriamycin and Cytoxan dose dense 4 followed by Taxol weekly 12 with carboplatin every 3 weeks x4(pembrolizumab will be added if she is triple negative) 2. Followed bybreast conserving surgery versusmastectomy andaxillary lymph node dissection (due to matted axillary lymph nodes) 3. Followed by adjuvant radiation therapy PET CT scan: Known breast cancer and lymphadenopathy in the axilla, supraclavicular, pectoral, internal mammary lymph nodes, no distant metastatic disease -------------------------------------------------------------------------- Current treatment:Completed 4 cycles ofneoadjuvant Adriamycin Cytoxan and Pembrolizumab, today is cycle 5Taxol, [place] (held carboplatin last week for platelets of 75)  Toxicities: Denies any nausea or vomiting. 1.Fatigue 2.Alopecia 3.  Hypertension: Monitoring closely. 4.  Chemotherapy-induced anemia 5.  Thrombocytopenia: Platelet count 39: We therefore will hold off on doing any further chemotherapy until the platelet count recovers.  Monitoring closely for immunotherapy related adverse effects.:monitoringTSH.  Return to clinic next week to see if she can receive treatment.

## 2021-02-10 LAB — T4: T4, Total: 9.2 ug/dL (ref 4.5–12.0)

## 2021-02-15 NOTE — Progress Notes (Signed)
Patient Care Team: Riki Sheer, NP as PCP - General (Nurse Practitioner) Mauro Kaufmann, RN as Oncology Nurse Navigator Rockwell Germany, RN as Oncology Nurse Navigator Rolm Bookbinder, MD as Consulting Physician (General Surgery) Nicholas Lose, MD as Consulting Physician (Hematology and Oncology) Kyung Rudd, MD as Consulting Physician (Radiation Oncology)  DIAGNOSIS:    ICD-10-CM   1. Malignant neoplasm of upper-outer quadrant of left breast in female, estrogen receptor negative ([place])  C50.412    Z17.1       SUMMARY OF ONCOLOGIC HISTORY: Oncology History  Malignant neoplasm of upper-outer quadrant of left breast in female, estrogen receptor negative ([place])  09/30/2020 Initial Diagnosis   Swollen left breast: Mammogram detected left breast mass UOQ skin thickening with 3 abnormal lymph nodes that are matted and hard, ultrasound 2.7 cm, 2.9 cm, 3 cm and 3 lymph nodes biopsy revealed grade 2-3 IDC with DCIS ER/PR negative HER-2 IHC equivocal FISH pending, Ki-67 60%, lymph node biopsy positive   10/01/2020 Cancer Staging   Staging form: Breast, AJCC 8th Edition - Clinical stage from 10/01/2020: Stage IIIC (cT4b, cN2a, cM0, G3, ER-, PR-, HER2: Equivocal) - Signed by Nicholas Lose, MD on 10/01/2020 Stage prefix: Initial diagnosis Histologic grading system: 3 grade system   10/13/2020 Genetic Testing   Negative hereditary cancer genetic testing: no pathogenic variants detected in [facility] CustomNext-Cancer +RNAinsight Panel.  The report date is October 13, 2020.   The CustomNext-Cancer+RNAinsight panel offered by [facility] includes sequencing and rearrangement analysis for the following 47 genes:  APC, ATM, AXIN2, BARD1, BMPR1A, BRCA1, BRCA2, BRIP1, CDH1, CDK4, CDKN2A, CHEK2, DICER1, EPCAM, GREM1, HOXB13, MEN1, MLH1, MSH2, MSH3, MSH6, MUTYH, NBN, NF1, NF2, NTHL1, PALB2, PMS2, POLD1, POLE, PTEN, RAD51C, RAD51D, RECQL, RET, SDHA, SDHAF2, SDHB, SDHC, SDHD, SMAD4, SMARCA4, STK11, TP53,  TSC1, TSC2, and VHL.  RNA data is routinely analyzed for use in variant interpretation for all genes.   01/13/2021 -  Chemotherapy    Patient is on Treatment Plan: BREAST PEMBROLIZUMAB + AC Q21D X 4 CYCLES / PEMBROLIZUMAB + CARBOPLATIN D1 + PACLITAXEL D1,8,15 Q21D X 4 CYCLES         CHIEF COMPLIANT: Cycle 5 Taxol  INTERVAL HISTORY: Rita Cole is a 60 y.o. with above-mentioned history of left breast cancer currently on neoadjuvant chemotherapy with Taxol Carboplatin and Keytruda. She presents to the clinic for Taxol.  We did not do treatment last week because of thrombocytopenia.  She reports no new problems or concerns.  She feels pretty good.  Denies any bruising or bleeding.  She ate lots of greens beans and peas.  And took B6 and B12.  ALLERGIES:  has No Known Allergies.  MEDICATIONS:  Current Outpatient Medications  Medication Sig Dispense Refill   benazepril (LOTENSIN) 40 MG tablet Take 40 mg by mouth daily.     cyclobenzaprine (FLEXERIL) 10 MG tablet Take 10 mg by mouth 3 (three) times daily as needed.     hydrochlorothiazide (HYDRODIURIL) 25 MG tablet Take 1 tablet (25 mg total) by mouth daily. 30 tablet 0   ketorolac (TORADOL) 10 MG tablet Take 1 tablet (10 mg total) by mouth every 6 (six) hours as needed. 20 tablet 0   lidocaine-prilocaine (EMLA) cream Apply to affected area once 30 g 3   meloxicam (MOBIC) 15 MG tablet Take 1 tablet (15 mg total) by mouth daily. 10 tablet 0   ondansetron (ZOFRAN) 8 MG tablet Take 1 tablet (8 mg total) by mouth 2 (two) times daily as  needed. Start on the third day after carboplatin and AC chemotherapy. 30 tablet 1   prochlorperazine (COMPAZINE) 10 MG tablet Take 1 tablet (10 mg total) by mouth every 6 (six) hours as needed (Nausea or vomiting). 30 tablet 1   tiZANidine (ZANAFLEX) 4 MG tablet Take 4 mg by mouth 3 (three) times daily as needed.     Vitamin D, Ergocalciferol, (DRISDOL) 1.25 MG (50000 UNIT) CAPS capsule Take 50,000 Units by  mouth once a week. Monday     No current facility-administered medications for this visit.    PHYSICAL EXAMINATION: ECOG PERFORMANCE STATUS: 1 - Symptomatic but completely ambulatory  Vitals:   02/16/21 0926  BP: (!) 145/80  Pulse: 96  Resp: 19  Temp: 97.9 F (36.6 C)  SpO2: 100%   Filed Weights   02/16/21 0926  Weight: 212 lb 14.4 oz (96.6 kg)      LABORATORY DATA:  I have reviewed the data as listed CMP Latest Ref Rng & Units 02/09/2021 02/03/2021 01/26/2021  Glucose 70 - 99 mg/dL 111(H) 116(H) 100(H)  BUN 6 - 20 mg/dL $Remove'16 10 17  'yJRagFF$ Creatinine 0.44 - 1.00 mg/dL 0.81 0.93 0.79  Sodium 135 - 145 mmol/L 140 142 142  Potassium 3.5 - 5.1 mmol/L 3.7 3.6 4.0  Chloride 98 - 111 mmol/L 101 103 105  CO2 22 - 32 mmol/L $RemoveB'29 27 29  'nftuRNNA$ Calcium 8.9 - 10.3 mg/dL 9.9 10.0 10.0  Total Protein 6.5 - 8.1 g/dL 7.2 7.8 7.4  Total Bilirubin 0.3 - 1.2 mg/dL 0.5 0.3 0.3  Alkaline Phos 38 - 126 U/L 70 107 72  AST 15 - 41 U/L $Remo'19 22 26  'MVlte$ ALT 0 - 44 U/L $Remo'23 24 27    'QvrWN$ Lab Results  Component Value Date   WBC 1.9 (L) 02/16/2021   HGB 10.1 (L) 02/16/2021   HCT 30.1 (L) 02/16/2021   MCV 96.2 02/16/2021   PLT 168 02/16/2021   NEUTROABS PENDING 02/16/2021    ASSESSMENT & PLAN:  Malignant neoplasm of upper-outer quadrant of left breast in female, estrogen receptor negative ([place]) 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 Neoadjuvant chemotherapy with Adriamycin and Cytoxan dose dense 4 followed by Taxol weekly 12 with carboplatin every 3 weeks x4 (pembrolizumab will be added if she is triple negative) 2. Followed by breast conserving surgery versus mastectomy and axillary lymph node dissection (due to matted  axillary lymph nodes) 3. Followed by adjuvant radiation therapy PET CT scan: Known breast cancer and lymphadenopathy in the axilla, supraclavicular, pectoral, internal mammary lymph nodes, no distant metastatic disease -------------------------------------------------------------------------- Current treatment: Completed 4 cycles of neoadjuvant Adriamycin Cytoxan and Pembrolizumab, today is cycle 5 Taxol, carbo and [place] (held carboplatin last week for platelets of 75)   Toxicities: Denies any nausea or vomiting. 1. Fatigue 2. Alopecia 3.  Hypertension: Monitoring closely. 4.  Chemotherapy-induced anemia: Hemoglobin stable 10.1 5.  Thrombocytopenia: Platelet count is improved to 168. 6.  Leukopenia: Waiting for today's ANC but she can proceed with today's treatment with a Granix injection to be given prior to each Taxol. Monitoring closely for immunotherapy related adverse effects.:  monitoring TSH.   Return to clinic weekly for chemotherapy.  On the weeks that  she gets Taxol alone I recommended that she get a Granix injection on the Saturday before treatment.    No orders of the defined types were placed in this encounter.  The patient has a good understanding of the overall plan. she agrees with it. she will call with any problems that may develop before the next visit here.  Total time spent: 30 mins including face to face time and time spent for planning, charting and coordination of care  Rulon Eisenmenger, MD, MPH 02/16/2021  I, Thana Ates, am acting as scribe for Dr. Nicholas Lose.  I have reviewed the above documentation for accuracy and completeness, and I agree with the above.

## 2021-02-16 ENCOUNTER — Other Ambulatory Visit: Payer: Self-pay | Admitting: Hematology and Oncology

## 2021-02-16 ENCOUNTER — Encounter: Payer: Self-pay | Admitting: *Deleted

## 2021-02-16 ENCOUNTER — Inpatient Hospital Stay: Payer: Federal, State, Local not specified - PPO

## 2021-02-16 ENCOUNTER — Telehealth: Payer: Self-pay | Admitting: Hematology and Oncology

## 2021-02-16 ENCOUNTER — Other Ambulatory Visit: Payer: Self-pay

## 2021-02-16 ENCOUNTER — Inpatient Hospital Stay: Payer: Federal, State, Local not specified - PPO | Admitting: Hematology and Oncology

## 2021-02-16 ENCOUNTER — Other Ambulatory Visit: Payer: Self-pay | Admitting: Pharmacist

## 2021-02-16 DIAGNOSIS — Z95828 Presence of other vascular implants and grafts: Secondary | ICD-10-CM

## 2021-02-16 DIAGNOSIS — C50412 Malignant neoplasm of upper-outer quadrant of left female breast: Secondary | ICD-10-CM | POA: Diagnosis not present

## 2021-02-16 DIAGNOSIS — Z171 Estrogen receptor negative status [ER-]: Secondary | ICD-10-CM

## 2021-02-16 LAB — CMP (CANCER CENTER ONLY)
ALT: 23 U/L (ref 0–44)
AST: 23 U/L (ref 15–41)
Albumin: 3.8 g/dL (ref 3.5–5.0)
Alkaline Phosphatase: 79 U/L (ref 38–126)
Anion gap: 12 (ref 5–15)
BUN: 11 mg/dL (ref 6–20)
CO2: 29 mmol/L (ref 22–32)
Calcium: 9.8 mg/dL (ref 8.9–10.3)
Chloride: 101 mmol/L (ref 98–111)
Creatinine: 0.83 mg/dL (ref 0.44–1.00)
GFR, Estimated: >60 mL/min (ref >60)
Glucose, Bld: 114 mg/dL — ABNORMAL HIGH (ref 70–99)
Potassium: 3.3 mmol/L — ABNORMAL LOW (ref 3.5–5.1)
Sodium: 142 mmol/L (ref 135–145)
Total Bilirubin: 0.3 mg/dL (ref 0.3–1.2)
Total Protein: 7.3 g/dL (ref 6.5–8.1)

## 2021-02-16 LAB — CBC WITH DIFFERENTIAL (CANCER CENTER ONLY)
Abs Immature Granulocytes: 0 10*3/uL (ref 0.00–0.07)
Basophils Absolute: 0 10*3/uL (ref 0.0–0.1)
Basophils Relative: 1 %
Eosinophils Absolute: 0.1 10*3/uL (ref 0.0–0.5)
Eosinophils Relative: 4 %
HCT: 30.1 % — ABNORMAL LOW (ref 36.0–46.0)
Hemoglobin: 10.1 g/dL — ABNORMAL LOW (ref 12.0–15.0)
Immature Granulocytes: 0 %
Lymphocytes Relative: 29 %
Lymphs Abs: 0.6 10*3/uL — ABNORMAL LOW (ref 0.7–4.0)
MCH: 32.3 pg (ref 26.0–34.0)
MCHC: 33.6 g/dL (ref 30.0–36.0)
MCV: 96.2 fL (ref 80.0–100.0)
Monocytes Absolute: 0.4 10*3/uL (ref 0.1–1.0)
Monocytes Relative: 22 %
Neutro Abs: 0.9 10*3/uL — ABNORMAL LOW (ref 1.7–7.7)
Neutrophils Relative %: 44 %
Platelet Count: 168 10*3/uL (ref 150–400)
RBC: 3.13 MIL/uL — ABNORMAL LOW (ref 3.87–5.11)
RDW: 17.2 % — ABNORMAL HIGH (ref 11.5–15.5)
WBC Count: 1.9 10*3/uL — ABNORMAL LOW (ref 4.0–10.5)
nRBC: 0 % (ref 0.0–0.2)

## 2021-02-16 LAB — TSH: TSH: 3.103 u[IU]/mL (ref 0.308–3.960)

## 2021-02-16 MED ORDER — DIPHENHYDRAMINE HCL 50 MG/ML IJ SOLN
25.0000 mg | Freq: Once | INTRAMUSCULAR | Status: AC
  Administered 2021-02-16: 25 mg via INTRAVENOUS

## 2021-02-16 MED ORDER — DIPHENHYDRAMINE HCL 50 MG/ML IJ SOLN
INTRAMUSCULAR | Status: AC
  Filled 2021-02-16: qty 1

## 2021-02-16 MED ORDER — FAMOTIDINE 20 MG IN NS 100 ML IVPB
20.0000 mg | Freq: Once | INTRAVENOUS | Status: AC
  Administered 2021-02-16: 20 mg via INTRAVENOUS

## 2021-02-16 MED ORDER — SODIUM CHLORIDE 0.9% FLUSH
10.0000 mL | INTRAVENOUS | Status: DC | PRN
  Administered 2021-02-16: 10 mL

## 2021-02-16 MED ORDER — SODIUM CHLORIDE 0.9% FLUSH
10.0000 mL | Freq: Once | INTRAVENOUS | Status: AC
  Administered 2021-02-16: 10 mL

## 2021-02-16 MED ORDER — SODIUM CHLORIDE 0.9 % IV SOLN
Freq: Once | INTRAVENOUS | Status: AC
Start: 2021-02-16 — End: 2021-02-16

## 2021-02-16 MED ORDER — SODIUM CHLORIDE 0.9 % IV SOLN
10.0000 mg | Freq: Once | INTRAVENOUS | Status: AC
  Administered 2021-02-16: 10 mg via INTRAVENOUS
  Filled 2021-02-16: qty 10

## 2021-02-16 MED ORDER — HEPARIN SOD (PORK) LOCK FLUSH 100 UNIT/ML IV SOLN
500.0000 [IU] | Freq: Once | INTRAVENOUS | Status: AC | PRN
  Administered 2021-02-16: 500 [IU]

## 2021-02-16 MED ORDER — FAMOTIDINE 20 MG IN NS 100 ML IVPB
INTRAVENOUS | Status: AC
  Filled 2021-02-16: qty 100

## 2021-02-16 MED ORDER — COLD PACK MISC ONCOLOGY
1.0000 | Freq: Once | Status: AC | PRN
  Administered 2021-02-16: 1 via TOPICAL

## 2021-02-16 MED ORDER — SODIUM CHLORIDE 0.9 % IV SOLN
65.0000 mg/m2 | Freq: Once | INTRAVENOUS | Status: AC
  Administered 2021-02-16: 138 mg via INTRAVENOUS
  Filled 2021-02-16: qty 23

## 2021-02-16 NOTE — Assessment & Plan Note (Signed)
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 Neoadjuvant chemotherapy with Adriamycin and Cytoxan dose dense 4 followed by Taxol weekly 12 with carboplatin every 3 weeks x4(pembrolizumab will be added if Rita Cole is triple negative) 2. Followed bybreast conserving surgery versusmastectomy andaxillary lymph node dissection (due to matted axillary lymph nodes) 3. Followed by adjuvant radiation therapy PET CT scan: Known breast cancer and lymphadenopathy in the axilla, supraclavicular, pectoral, internal mammary lymph nodes, no distant metastatic disease -------------------------------------------------------------------------- Current treatment:Completed 4 cycles ofneoadjuvant Adriamycin Cytoxan and Pembrolizumab, today is cycle5Taxol, carbo and keytruda(held carboplatin last week for platelets of 75)  Toxicities: Denies any nausea or vomiting. 1.Fatigue 2.Alopecia 3.Hypertension: Monitoring closely. 4.Chemotherapy-induced anemia 5.Thrombocytopenia: Platelet count 39: We therefore will hold off on doing any further chemotherapy until the platelet count recovers. I discussed with her that it might take several weeks for the platelets to come up sometimes. Monitoring closely for immunotherapy related adverse effects.:monitoringTSH.  Return to clinic next week to see if Rita Cole can receive treatment.

## 2021-02-16 NOTE — Patient Instructions (Signed)
[facility]  Discharge Instructions: Thank you for choosing [facility] to provide your oncology and hematology care.   If you have a lab appointment with the Cancer Center, please go directly to the Cancer Center and check in at the registration area.   Wear comfortable clothing and clothing appropriate for easy access to any Portacath or PICC line.   We strive to give you quality time with your provider. You may need to reschedule your appointment if you arrive late (15 or more minutes).  Arriving late affects you and other patients whose appointments are after yours.  Also, if you miss three or more appointments without notifying the office, you may be dismissed from the clinic at the provider's discretion.      For prescription refill requests, have your pharmacy contact our office and allow 72 hours for refills to be completed.    Today you received the following chemotherapy and/or immunotherapy agent: Paclitaxel (Taxol)   To help prevent nausea and vomiting after your treatment, we encourage you to take your nausea medication as directed.  BELOW ARE SYMPTOMS THAT SHOULD BE REPORTED IMMEDIATELY: *FEVER GREATER THAN 100.4 F (38 C) OR HIGHER *CHILLS OR SWEATING *NAUSEA AND VOMITING THAT IS NOT CONTROLLED WITH YOUR NAUSEA MEDICATION *UNUSUAL SHORTNESS OF BREATH *UNUSUAL BRUISING OR BLEEDING *URINARY PROBLEMS (pain or burning when urinating, or frequent urination) *BOWEL PROBLEMS (unusual diarrhea, constipation, pain near the anus) TENDERNESS IN MOUTH AND THROAT WITH OR WITHOUT PRESENCE OF ULCERS (sore throat, sores in mouth, or a toothache) UNUSUAL RASH, SWELLING OR PAIN  UNUSUAL VAGINAL DISCHARGE OR ITCHING   Items with * indicate a potential emergency and should be followed up as soon as possible or go to the Emergency Department if any problems should occur.  Please show the CHEMOTHERAPY ALERT CARD or IMMUNOTHERAPY ALERT CARD at  check-in to the Emergency Department and triage nurse.  Should you have questions after your visit or need to cancel or reschedule your appointment, please contact [facility]  Dept: [phone]  and follow the prompts.  Office hours are 8:00 a.m. to 4:30 p.m. Monday - Friday. Please note that voicemails left after 4:00 p.m. may not be returned until the following business day.  We are closed weekends and major holidays. You have access to a nurse at all times for urgent questions. Please call the main number to the clinic Dept: [phone] and follow the prompts.   For any non-urgent questions, you may also contact your provider using MyChart. We now offer e-Visits for anyone 18 and older to request care online for non-urgent symptoms. For details visit [email or web address].   Also download the MyChart app! Go to the app store, search "MyChart", open the app, select [facility], and log in with your MyChart username and password.  Due to Covid, a mask is required upon entering the hospital/clinic. If you do not have a mask, one will be given to you upon arrival. For doctor visits, patients may have 1 support person aged 18 or older with them. For treatment visits, patients cannot have anyone with them due to current Covid guidelines and our immunocompromised population.  

## 2021-02-16 NOTE — Progress Notes (Signed)
Per Dr. Lindi Adie, ok for treatment today with ANC 0.9 will dose reduce.

## 2021-02-16 NOTE — Telephone Encounter (Signed)
Scheduled per 8/18 los. Called and spoke with pt confirmed 8/21 appt

## 2021-02-17 LAB — T4: T4, Total: 9.9 ug/dL (ref 4.5–12.0)

## 2021-02-18 ENCOUNTER — Telehealth: Payer: Self-pay | Admitting: Hematology and Oncology

## 2021-02-18 NOTE — Telephone Encounter (Signed)
Scheduled per 8/23 los. Called and spoke with pt confirmed 8/27 appt

## 2021-02-19 ENCOUNTER — Ambulatory Visit: Payer: Federal, State, Local not specified - PPO

## 2021-02-20 ENCOUNTER — Ambulatory Visit: Payer: Federal, State, Local not specified - PPO

## 2021-02-21 ENCOUNTER — Other Ambulatory Visit: Payer: Self-pay

## 2021-02-21 ENCOUNTER — Ambulatory Visit: Payer: Federal, State, Local not specified - PPO

## 2021-02-21 ENCOUNTER — Inpatient Hospital Stay: Payer: Federal, State, Local not specified - PPO

## 2021-02-21 VITALS — BP 136/79 | HR 98 | Temp 97.6°F | Resp 18

## 2021-02-21 DIAGNOSIS — C50412 Malignant neoplasm of upper-outer quadrant of left female breast: Secondary | ICD-10-CM | POA: Diagnosis not present

## 2021-02-21 DIAGNOSIS — Z95828 Presence of other vascular implants and grafts: Secondary | ICD-10-CM

## 2021-02-21 MED ORDER — FILGRASTIM-AAFI 480 MCG/0.8ML IJ SOSY
480.0000 ug | PREFILLED_SYRINGE | Freq: Once | INTRAMUSCULAR | Status: AC
  Administered 2021-02-21: 480 ug via SUBCUTANEOUS

## 2021-02-21 MED ORDER — FILGRASTIM-AAFI 480 MCG/0.8ML IJ SOSY
PREFILLED_SYRINGE | INTRAMUSCULAR | Status: AC
  Filled 2021-02-21: qty 0.8

## 2021-02-22 NOTE — Progress Notes (Signed)
Patient Care Team: Riki Sheer, NP as PCP - General (Nurse Practitioner) Mauro Kaufmann, RN as Oncology Nurse Navigator Rockwell Germany, RN as Oncology Nurse Navigator Rolm Bookbinder, MD as Consulting Physician (General Surgery) Nicholas Lose, MD as Consulting Physician (Hematology and Oncology) Kyung Rudd, MD as Consulting Physician (Radiation Oncology)  DIAGNOSIS:    ICD-10-CM   1. Malignant neoplasm of upper-outer quadrant of left breast in female, estrogen receptor negative ([place])  C50.412 PHARMACY COMMUNICATION ONCOLOGY   Z17.1 PHARMACY COMMUNICATION ONCOLOGY    DISCONTINUED: PACLitaxel (TAXOL) 156 mg in sodium chloride 0.9 % 250 mL chemo infusion (</= $RemoveBefor'80mg'FCUzARexrvCl$ /m2)      SUMMARY OF ONCOLOGIC HISTORY: Oncology History  Malignant neoplasm of upper-outer quadrant of left breast in female, estrogen receptor negative ([place])  09/30/2020 Initial Diagnosis   Swollen left breast: Mammogram detected left breast mass UOQ skin thickening with 3 abnormal lymph nodes that are matted and hard, ultrasound 2.7 cm, 2.9 cm, 3 cm and 3 lymph nodes biopsy revealed grade 2-3 IDC with DCIS ER/PR negative HER-2 IHC equivocal FISH pending, Ki-67 60%, lymph node biopsy positive   10/01/2020 Cancer Staging   Staging form: Breast, AJCC 8th Edition - Clinical stage from 10/01/2020: Stage IIIC (cT4b, cN2a, cM0, G3, ER-, PR-, HER2: Equivocal) - Signed by Nicholas Lose, MD on 10/01/2020 Stage prefix: Initial diagnosis Histologic grading system: 3 grade system   10/13/2020 Genetic Testing   Negative hereditary cancer genetic testing: no pathogenic variants detected in [facility] CustomNext-Cancer +RNAinsight Panel.  The report date is October 13, 2020.   The CustomNext-Cancer+RNAinsight panel offered by [facility] includes sequencing and rearrangement analysis for the following 47 genes:  APC, ATM, AXIN2, BARD1, BMPR1A, BRCA1, BRCA2, BRIP1, CDH1, CDK4, CDKN2A, CHEK2, DICER1, EPCAM, GREM1, HOXB13, MEN1, MLH1, MSH2,  MSH3, MSH6, MUTYH, NBN, NF1, NF2, NTHL1, PALB2, PMS2, POLD1, POLE, PTEN, RAD51C, RAD51D, RECQL, RET, SDHA, SDHAF2, SDHB, SDHC, SDHD, SMAD4, SMARCA4, STK11, TP53, TSC1, TSC2, and VHL.  RNA data is routinely analyzed for use in variant interpretation for all genes.   01/13/2021 -  Chemotherapy    Patient is on Treatment Plan: BREAST PEMBROLIZUMAB + AC Q21D X 4 CYCLES / PEMBROLIZUMAB + CARBOPLATIN D1 + PACLITAXEL D1,8,15 Q21D X 4 CYCLES         CHIEF COMPLIANT: Taxol  INTERVAL HISTORY: Rita Cole is a 60 y.o. with above-mentioned history of left breast cancer currently on neoadjuvant chemotherapy with Taxol Carboplatin and Keytruda. She presents to the clinic for Taxol.  She received G-CSF injection on Saturday and she had done well from that.  She is here today to review her blood work and proceed with Taxol treatment.  ALLERGIES:  has No Known Allergies.  MEDICATIONS:  Current Outpatient Medications  Medication Sig Dispense Refill   benazepril (LOTENSIN) 40 MG tablet Take 40 mg by mouth daily.     cyclobenzaprine (FLEXERIL) 10 MG tablet Take 10 mg by mouth 3 (three) times daily as needed.     hydrochlorothiazide (HYDRODIURIL) 25 MG tablet Take 1 tablet (25 mg total) by mouth daily. 30 tablet 0   ketorolac (TORADOL) 10 MG tablet Take 1 tablet (10 mg total) by mouth every 6 (six) hours as needed. 20 tablet 0   lidocaine-prilocaine (EMLA) cream Apply to affected area once 30 g 3   meloxicam (MOBIC) 15 MG tablet Take 1 tablet (15 mg total) by mouth daily. 10 tablet 0   ondansetron (ZOFRAN) 8 MG tablet Take 1 tablet (8 mg total) by mouth  2 (two) times daily as needed. Start on the third day after carboplatin and AC chemotherapy. 30 tablet 1   prochlorperazine (COMPAZINE) 10 MG tablet Take 1 tablet (10 mg total) by mouth every 6 (six) hours as needed (Nausea or vomiting). 30 tablet 1   tiZANidine (ZANAFLEX) 4 MG tablet Take 4 mg by mouth 3 (three) times daily as needed.     Vitamin D,  Ergocalciferol, (DRISDOL) 1.25 MG (50000 UNIT) CAPS capsule Take 50,000 Units by mouth once a week. Monday     No current facility-administered medications for this visit.   Facility-Administered Medications Ordered in Other Visits  Medication Dose Route Frequency Provider Last Rate Last Admin   dexamethasone (DECADRON) 10 mg in sodium chloride 0.9 % 50 mL IVPB  10 mg Intravenous Once Nicholas Lose, MD       diphenhydrAMINE (BENADRYL) injection 25 mg  25 mg Intravenous Once Nicholas Lose, MD       famotidine (PEPCID) IVPB 20 mg in NS 100 mL IVPB  20 mg Intravenous Once Nicholas Lose, MD       heparin lock flush 100 unit/mL  500 Units Intracatheter Once PRN Nicholas Lose, MD       PACLitaxel (TAXOL) 156 mg in sodium chloride 0.9 % 250 mL chemo infusion (</= $RemoveBefor'80mg'ORHgPBiGdNUH$ /m2)  75 mg/m2 (Treatment Plan Recorded) Intravenous Once Nicholas Lose, MD       sodium chloride flush (NS) 0.9 % injection 10 mL  10 mL Intracatheter PRN Nicholas Lose, MD        PHYSICAL EXAMINATION: ECOG PERFORMANCE STATUS: 1 - Symptomatic but completely ambulatory  Vitals:   02/23/21 1200  BP: (!) 144/74  Pulse: 95  Resp: 18  Temp: 97.6 F (36.4 C)  SpO2: 100%   Filed Weights   02/23/21 1200  Weight: 212 lb 4.8 oz (96.3 kg)    LABORATORY DATA:  I have reviewed the data as listed CMP Latest Ref Rng & Units 02/23/2021 02/16/2021 02/09/2021  Glucose 70 - 99 mg/dL 131(H) 114(H) 111(H)  BUN 6 - 20 mg/dL $Remove'13 11 16  'FCdtrSB$ Creatinine 0.44 - 1.00 mg/dL 0.90 0.83 0.81  Sodium 135 - 145 mmol/L 143 142 140  Potassium 3.5 - 5.1 mmol/L 3.3(L) 3.3(L) 3.7  Chloride 98 - 111 mmol/L 101 101 101  CO2 22 - 32 mmol/L $RemoveB'31 29 29  'cjTjNqjO$ Calcium 8.9 - 10.3 mg/dL 10.1 9.8 9.9  Total Protein 6.5 - 8.1 g/dL 7.5 7.3 7.2  Total Bilirubin 0.3 - 1.2 mg/dL 0.3 0.3 0.5  Alkaline Phos 38 - 126 U/L 97 79 70  AST 15 - 41 U/L $Remo'28 23 19  'sLdCp$ ALT 0 - 44 U/L $Remo'26 23 23    'IkkEr$ Lab Results  Component Value Date   WBC 18.5 (H) 02/23/2021   HGB 10.4 (L) 02/23/2021   HCT 31.4  (L) 02/23/2021   MCV 98.1 02/23/2021   PLT 315 02/23/2021   NEUTROABS 14.5 (H) 02/23/2021    ASSESSMENT & PLAN:  Malignant neoplasm of upper-outer quadrant of left breast in female, estrogen receptor negative ([place]) 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 Neoadjuvant chemotherapy with Adriamycin and Cytoxan dose dense 4 followed by Taxol weekly 12 with carboplatin every 3  weeks x4 (pembrolizumab will be added if she is triple negative) 2. Followed by breast conserving surgery versus mastectomy and axillary lymph node dissection (due to matted axillary lymph nodes) 3. Followed by adjuvant radiation therapy PET CT scan: Known breast cancer and lymphadenopathy in the axilla, supraclavicular, pectoral, internal mammary lymph nodes, no distant metastatic disease -------------------------------------------------------------------------- Current treatment: Completed 4 cycles of neoadjuvant Adriamycin Cytoxan and Pembrolizumab, today is cycle 6 Taxol, carbo and [place] (held carboplatin for platelets of 75)   Toxicities: Denies any nausea or vomiting. 1. Fatigue 2. Alopecia 3.  Hypertension: Monitoring closely. 4.  Chemotherapy-induced anemia: Hemoglobin stable 10.4 5.  Thrombocytopenia: Platelet count is improved to 315 6.  Leukopenia: Gets Granix prior to each chemo  In 3 weeks patient will get Taxol [place] and [place]. Previously with carboplatin her platelet counts declined significantly.  She is worried about that.  Monitoring closely for immunotherapy related adverse effects.:  monitoring TSH.   Return to clinic weekly for chemotherapy.      Orders Placed This Encounter   Procedures   [place] / Pharmacy: per Dr. Lindi Adie, patient will receive GCSF on Saturdays (see supportive therapy plan when releasing) after days 1 and 8 of chemotherapy.  In addition, patient will continue to receive GCSF on days 17, 18, and 19 (see treatment plan when releasing). Thank you.    Standing Status:   Standing    Number of Occurrences:   1   The patient has a good understanding of the overall plan. she agrees with it. she will call with any problems that may develop before the next visit here.  Total time spent: 30 mins including face to face time and time spent for planning, charting and coordination of care  Rulon Eisenmenger, MD, MPH 02/23/2021  I, Thana Ates, am acting as scribe for Dr. Nicholas Lose.  I have reviewed the above documentation for accuracy and completeness, and I agree with the above.

## 2021-02-23 ENCOUNTER — Other Ambulatory Visit: Payer: Federal, State, Local not specified - PPO

## 2021-02-23 ENCOUNTER — Inpatient Hospital Stay: Payer: Federal, State, Local not specified - PPO

## 2021-02-23 ENCOUNTER — Inpatient Hospital Stay: Payer: Federal, State, Local not specified - PPO | Admitting: Hematology and Oncology

## 2021-02-23 ENCOUNTER — Ambulatory Visit: Payer: Federal, State, Local not specified - PPO

## 2021-02-23 ENCOUNTER — Other Ambulatory Visit: Payer: Self-pay

## 2021-02-23 VITALS — BP 144/74 | HR 95 | Temp 97.6°F | Resp 18 | Ht 66.0 in | Wt 212.3 lb

## 2021-02-23 DIAGNOSIS — C50412 Malignant neoplasm of upper-outer quadrant of left female breast: Secondary | ICD-10-CM | POA: Diagnosis not present

## 2021-02-23 DIAGNOSIS — Z171 Estrogen receptor negative status [ER-]: Secondary | ICD-10-CM

## 2021-02-23 DIAGNOSIS — Z95828 Presence of other vascular implants and grafts: Secondary | ICD-10-CM

## 2021-02-23 LAB — CBC WITH DIFFERENTIAL (CANCER CENTER ONLY)
Abs Immature Granulocytes: 1.47 10*3/uL — ABNORMAL HIGH (ref 0.00–0.07)
Basophils Absolute: 0.1 10*3/uL (ref 0.0–0.1)
Basophils Relative: 0 %
Eosinophils Absolute: 0.1 10*3/uL (ref 0.0–0.5)
Eosinophils Relative: 0 %
HCT: 31.4 % — ABNORMAL LOW (ref 36.0–46.0)
Hemoglobin: 10.4 g/dL — ABNORMAL LOW (ref 12.0–15.0)
Immature Granulocytes: 8 %
Lymphocytes Relative: 6 %
Lymphs Abs: 1 10*3/uL (ref 0.7–4.0)
MCH: 32.5 pg (ref 26.0–34.0)
MCHC: 33.1 g/dL (ref 30.0–36.0)
MCV: 98.1 fL (ref 80.0–100.0)
Monocytes Absolute: 1.3 10*3/uL — ABNORMAL HIGH (ref 0.1–1.0)
Monocytes Relative: 7 %
Neutro Abs: 14.5 10*3/uL — ABNORMAL HIGH (ref 1.7–7.7)
Neutrophils Relative %: 79 %
Platelet Count: 315 10*3/uL (ref 150–400)
RBC: 3.2 MIL/uL — ABNORMAL LOW (ref 3.87–5.11)
RDW: 17.2 % — ABNORMAL HIGH (ref 11.5–15.5)
WBC Count: 18.5 10*3/uL — ABNORMAL HIGH (ref 4.0–10.5)
nRBC: 0.1 % (ref 0.0–0.2)

## 2021-02-23 LAB — CMP (CANCER CENTER ONLY)
ALT: 26 U/L (ref 0–44)
AST: 28 U/L (ref 15–41)
Albumin: 4 g/dL (ref 3.5–5.0)
Alkaline Phosphatase: 97 U/L (ref 38–126)
Anion gap: 11 (ref 5–15)
BUN: 13 mg/dL (ref 6–20)
CO2: 31 mmol/L (ref 22–32)
Calcium: 10.1 mg/dL (ref 8.9–10.3)
Chloride: 101 mmol/L (ref 98–111)
Creatinine: 0.9 mg/dL (ref 0.44–1.00)
GFR, Estimated: >60 mL/min (ref >60)
Glucose, Bld: 131 mg/dL — ABNORMAL HIGH (ref 70–99)
Potassium: 3.3 mmol/L — ABNORMAL LOW (ref 3.5–5.1)
Sodium: 143 mmol/L (ref 135–145)
Total Bilirubin: 0.3 mg/dL (ref 0.3–1.2)
Total Protein: 7.5 g/dL (ref 6.5–8.1)

## 2021-02-23 LAB — TSH: TSH: 2.652 u[IU]/mL (ref 0.308–3.960)

## 2021-02-23 MED ORDER — SODIUM CHLORIDE 0.9% FLUSH
10.0000 mL | INTRAVENOUS | Status: DC | PRN
  Administered 2021-02-23: 10 mL

## 2021-02-23 MED ORDER — FAMOTIDINE 20 MG IN NS 100 ML IVPB
20.0000 mg | Freq: Once | INTRAVENOUS | Status: AC
  Administered 2021-02-23: 20 mg via INTRAVENOUS
  Filled 2021-02-23: qty 100

## 2021-02-23 MED ORDER — SODIUM CHLORIDE 0.9 % IV SOLN
Freq: Once | INTRAVENOUS | Status: AC
Start: 2021-02-23 — End: 2021-02-23

## 2021-02-23 MED ORDER — SODIUM CHLORIDE 0.9 % IV SOLN
75.0000 mg/m2 | Freq: Once | INTRAVENOUS | Status: AC
  Administered 2021-02-23: 156 mg via INTRAVENOUS
  Filled 2021-02-23: qty 26

## 2021-02-23 MED ORDER — HEPARIN SOD (PORK) LOCK FLUSH 100 UNIT/ML IV SOLN
500.0000 [IU] | Freq: Once | INTRAVENOUS | Status: AC | PRN
  Administered 2021-02-23: 500 [IU]

## 2021-02-23 MED ORDER — SODIUM CHLORIDE 0.9% FLUSH: 10.0000 mL | Freq: Once | INTRAVENOUS | Status: DC

## 2021-02-23 MED ORDER — DIPHENHYDRAMINE HCL 50 MG/ML IJ SOLN
25.0000 mg | Freq: Once | INTRAMUSCULAR | Status: AC
  Administered 2021-02-23: 25 mg via INTRAVENOUS
  Filled 2021-02-23: qty 1

## 2021-02-23 MED ORDER — DEXAMETHASONE SODIUM PHOSPHATE 100 MG/10ML IJ SOLN
10.0000 mg | Freq: Once | INTRAMUSCULAR | Status: AC
  Administered 2021-02-23: 10 mg via INTRAVENOUS
  Filled 2021-02-23: qty 1
  Filled 2021-02-23: qty 10

## 2021-02-23 NOTE — Assessment & Plan Note (Signed)
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 Neoadjuvant chemotherapy with Adriamycin and Cytoxan dose dense 4 followed by Taxol weekly 12 with carboplatin every 3 weeks x4(pembrolizumab will be added if she is triple negative) 2. Followed bybreast conserving surgery versusmastectomy andaxillary lymph node dissection (due to matted axillary lymph nodes) 3. Followed by adjuvant radiation therapy PET CT scan: Known breast cancer and lymphadenopathy in the axilla, supraclavicular, pectoral, internal mammary lymph nodes, no distant metastatic disease -------------------------------------------------------------------------- Current treatment:Completed 4 cycles ofneoadjuvant Adriamycin Cytoxan and Pembrolizumab, today is cycle6Taxol, carbo and keytruda(heldcarboplatinfor platelets of 75)  Toxicities: Denies any nausea or vomiting. 1.Fatigue 2.Alopecia 3.Hypertension: Monitoring closely. 4.Chemotherapy-induced anemia: Hemoglobin stable 10.1 5.Thrombocytopenia: Platelet count is improved to 168. 6.  Leukopenia: Gets Granix prior to each chemo  Monitoring closely for immunotherapy related adverse effects.:monitoringTSH.  Return to clinic weekly for chemotherapy.

## 2021-02-23 NOTE — Patient Instructions (Signed)
[facility]   Discharge Instructions: Thank you for choosing [facility] to provide your oncology and hematology care.   If you have a lab appointment with the [facility], please go directly to the [facility] and check in at the registration area.   Wear comfortable clothing and clothing appropriate for easy access to any Portacath or PICC line.   We strive to give you quality time with your provider. You may need to reschedule your appointment if you arrive late (15 or more minutes).  Arriving late affects you and other patients whose appointments are after yours.  Also, if you miss three or more appointments without notifying the office, you may be dismissed from the clinic at the provider's discretion.      For prescription refill requests, have your pharmacy contact our office and allow 72 hours for refills to be completed.    Today you received the following chemotherapy and/or immunotherapy agents: Paclitaxel (Taxol)      To help prevent nausea and vomiting after your treatment, we encourage you to take your nausea medication as directed.  BELOW ARE SYMPTOMS THAT SHOULD BE REPORTED IMMEDIATELY: *FEVER GREATER THAN 100.4 F (38 C) OR HIGHER *CHILLS OR SWEATING *NAUSEA AND VOMITING THAT IS NOT CONTROLLED WITH YOUR NAUSEA MEDICATION *UNUSUAL SHORTNESS OF BREATH *UNUSUAL BRUISING OR BLEEDING *URINARY PROBLEMS (pain or burning when urinating, or frequent urination) *BOWEL PROBLEMS (unusual diarrhea, constipation, pain near the anus) TENDERNESS IN MOUTH AND THROAT WITH OR WITHOUT PRESENCE OF ULCERS (sore throat, sores in mouth, or a toothache) UNUSUAL RASH, SWELLING OR PAIN  UNUSUAL VAGINAL DISCHARGE OR ITCHING   Items with * indicate a potential emergency and should be followed up as soon as possible or go to the Emergency Department if any problems should occur.  Please show the CHEMOTHERAPY ALERT CARD or IMMUNOTHERAPY ALERT CARD at  check-in to the Emergency Department and triage nurse.  Should you have questions after your visit or need to cancel or reschedule your appointment, please contact [facility]  Dept: [phone]  and follow the prompts.  Office hours are 8:00 a.m. to 4:30 p.m. Monday - Friday. Please note that voicemails left after 4:00 p.m. may not be returned until the following business day.  We are closed weekends and major holidays. You have access to a nurse at all times for urgent questions. Please call the main number to the clinic Dept: [phone] and follow the prompts.   For any non-urgent questions, you may also contact your provider using MyChart. We now offer e-Visits for anyone 27 and older to request care online for non-urgent symptoms. For details visit [email or web address].   Also download the MyChart app! Go to the app store, search "MyChart", open the app, select [facility], and log in with your MyChart username and password.  Due to Covid, a mask is required upon entering the hospital/clinic. If you do not have a mask, one will be given to you upon arrival. For doctor visits, patients may have 1 support person aged 46 or older with them. For treatment visits, patients cannot have anyone with them due to current Covid guidelines and our immunocompromised population.

## 2021-02-24 ENCOUNTER — Other Ambulatory Visit: Payer: Federal, State, Local not specified - PPO

## 2021-02-24 ENCOUNTER — Ambulatory Visit: Payer: Federal, State, Local not specified - PPO | Admitting: Hematology and Oncology

## 2021-02-24 ENCOUNTER — Ambulatory Visit: Payer: Federal, State, Local not specified - PPO

## 2021-02-24 LAB — T4: T4, Total: 11.3 ug/dL (ref 4.5–12.0)

## 2021-02-26 ENCOUNTER — Other Ambulatory Visit: Payer: Self-pay

## 2021-02-26 ENCOUNTER — Inpatient Hospital Stay: Payer: Federal, State, Local not specified - PPO

## 2021-02-26 MED ORDER — FILGRASTIM-AAFI 480 MCG/0.8ML IJ SOSY: 480.0000 ug | PREFILLED_SYRINGE | Freq: Once | INTRAMUSCULAR | Status: DC

## 2021-02-26 NOTE — Progress Notes (Signed)
Spoke with MD Lindi Adie. He has decided to hold off on giving nivestym 9/1, 9/2, and 9/3 due to patient not being neutropenic.   Larene Beach, PharmD

## 2021-02-26 NOTE — Progress Notes (Signed)
Nivestym injection appts for 9/1,9/2,9/3 canceled per Dr. Lindi Adie. Pt will hold off on injections this round.

## 2021-02-27 ENCOUNTER — Inpatient Hospital Stay: Payer: Federal, State, Local not specified - PPO

## 2021-02-28 ENCOUNTER — Inpatient Hospital Stay: Payer: Federal, State, Local not specified - PPO

## 2021-03-01 NOTE — Progress Notes (Signed)
Patient Care Team: Riki Sheer, NP as PCP - General (Nurse Practitioner) Mauro Kaufmann, RN as Oncology Nurse Navigator Rockwell Germany, RN as Oncology Nurse Navigator Rolm Bookbinder, MD as Consulting Physician (General Surgery) Nicholas Lose, MD as Consulting Physician (Hematology and Oncology) Kyung Rudd, MD as Consulting Physician (Radiation Oncology)  DIAGNOSIS:    ICD-10-CM   1. Malignant neoplasm of upper-outer quadrant of left breast in female, estrogen receptor negative ([place])  C50.412    Z17.1       SUMMARY OF ONCOLOGIC HISTORY: Oncology History  Malignant neoplasm of upper-outer quadrant of left breast in female, estrogen receptor negative ([place])  09/30/2020 Initial Diagnosis   Swollen left breast: Mammogram detected left breast mass UOQ skin thickening with 3 abnormal lymph nodes that are matted and hard, ultrasound 2.7 cm, 2.9 cm, 3 cm and 3 lymph nodes biopsy revealed grade 2-3 IDC with DCIS ER/PR negative HER-2 IHC equivocal FISH pending, Ki-67 60%, lymph node biopsy positive   10/01/2020 Cancer Staging   Staging form: Breast, AJCC 8th Edition - Clinical stage from 10/01/2020: Stage IIIC (cT4b, cN2a, cM0, G3, ER-, PR-, HER2: Equivocal) - Signed by Nicholas Lose, MD on 10/01/2020 Stage prefix: Initial diagnosis Histologic grading system: 3 grade system   10/13/2020 Genetic Testing   Negative hereditary cancer genetic testing: no pathogenic variants detected in [facility] CustomNext-Cancer +RNAinsight Panel.  The report date is October 13, 2020.   The CustomNext-Cancer+RNAinsight panel offered by [facility] includes sequencing and rearrangement analysis for the following 47 genes:  APC, ATM, AXIN2, BARD1, BMPR1A, BRCA1, BRCA2, BRIP1, CDH1, CDK4, CDKN2A, CHEK2, DICER1, EPCAM, GREM1, HOXB13, MEN1, MLH1, MSH2, MSH3, MSH6, MUTYH, NBN, NF1, NF2, NTHL1, PALB2, PMS2, POLD1, POLE, PTEN, RAD51C, RAD51D, RECQL, RET, SDHA, SDHAF2, SDHB, SDHC, SDHD, SMAD4, SMARCA4, STK11, TP53,  TSC1, TSC2, and VHL.  RNA data is routinely analyzed for use in variant interpretation for all genes.   01/13/2021 -  Chemotherapy    Patient is on Treatment Plan: BREAST PEMBROLIZUMAB + AC Q21D X 4 CYCLES / PEMBROLIZUMAB + CARBOPLATIN D1 + PACLITAXEL D1,8,15 Q21D X 4 CYCLES         CHIEF COMPLIANT: Taxol Keytruda  INTERVAL HISTORY: Rita Cole is a 60 y.o. with above-mentioned history of left breast cancer currently on neoadjuvant chemotherapy with Taxol Carboplatin and Keytruda. She presents to the clinic for Taxol and Keytruda.  We discontinued carboplatin because of severe thrombocytopenia.  Therefore we are not doing the carboplatin today.  We only gave her 1 injection of Granix in the last week.  She had a dramatic increase in the white blood cell and therefore we decided not to do the day 2 and day 3 injections.  ALLERGIES:  has No Known Allergies.  MEDICATIONS:  Current Outpatient Medications  Medication Sig Dispense Refill   benazepril (LOTENSIN) 40 MG tablet Take 40 mg by mouth daily.     cyclobenzaprine (FLEXERIL) 10 MG tablet Take 10 mg by mouth 3 (three) times daily as needed.     hydrochlorothiazide (HYDRODIURIL) 25 MG tablet Take 1 tablet (25 mg total) by mouth daily. 30 tablet 0   ketorolac (TORADOL) 10 MG tablet Take 1 tablet (10 mg total) by mouth every 6 (six) hours as needed. 20 tablet 0   lidocaine-prilocaine (EMLA) cream Apply to affected area once 30 g 3   meloxicam (MOBIC) 15 MG tablet Take 1 tablet (15 mg total) by mouth daily. 10 tablet 0   ondansetron (ZOFRAN) 8 MG tablet Take 1  tablet (8 mg total) by mouth 2 (two) times daily as needed. Start on the third day after carboplatin and AC chemotherapy. 30 tablet 1   prochlorperazine (COMPAZINE) 10 MG tablet Take 1 tablet (10 mg total) by mouth every 6 (six) hours as needed (Nausea or vomiting). 30 tablet 1   tiZANidine (ZANAFLEX) 4 MG tablet Take 4 mg by mouth 3 (three) times daily as needed.     Vitamin  D, Ergocalciferol, (DRISDOL) 1.25 MG (50000 UNIT) CAPS capsule Take 50,000 Units by mouth once a week. Monday     No current facility-administered medications for this visit.   Facility-Administered Medications Ordered in Other Visits  Medication Dose Route Frequency Provider Last Rate Last Admin   0.9 %  sodium chloride infusion   Intravenous Once Nicholas Lose, MD       dexamethasone (DECADRON) 10 mg in sodium chloride 0.9 % 50 mL IVPB  10 mg Intravenous Once Nicholas Lose, MD       diphenhydrAMINE (BENADRYL) injection 25 mg  25 mg Intravenous Once Nicholas Lose, MD       famotidine (PEPCID) IVPB 20 mg in NS 100 mL IVPB  20 mg Intravenous Once Nicholas Lose, MD       fosaprepitant (EMEND) 150 mg in sodium chloride 0.9 % 145 mL IVPB  150 mg Intravenous Once Nicholas Lose, MD       heparin lock flush 100 unit/mL  500 Units Intracatheter Once PRN Nicholas Lose, MD       PACLitaxel (TAXOL) 156 mg in sodium chloride 0.9 % 250 mL chemo infusion (</= 10m/m2)  75 mg/m2 (Treatment Plan Recorded) Intravenous Once GNicholas Lose MD       palonosetron (ALOXI) injection 0.25 mg  0.25 mg Intravenous Once GNicholas Lose MD       pembrolizumab (KEYTRUDA) 200 mg in sodium chloride 0.9 % 50 mL chemo infusion  200 mg Intravenous Once GNicholas Lose MD       sodium chloride flush (NS) 0.9 % injection 10 mL  10 mL Intracatheter PRN GNicholas Lose MD        PHYSICAL EXAMINATION: ECOG PERFORMANCE STATUS: 1 - Symptomatic but completely ambulatory  Vitals:   03/03/21 1111  BP: 139/62  Pulse: 77  Resp: 18  Temp: 97.8 F (36.6 C)  SpO2: 100%   Filed Weights   03/03/21 1111  Weight: 213 lb 11.2 oz (96.9 kg)      LABORATORY DATA:  I have reviewed the data as listed CMP Latest Ref Rng & Units 03/03/2021 02/23/2021 02/16/2021  Glucose 70 - 99 mg/dL 97 131(H) 114(H)  BUN 6 - 20 mg/dL 27(H) 13 11  Creatinine 0.44 - 1.00 mg/dL 1.08(H) 0.90 0.83  Sodium 135 - 145 mmol/L 141 143 142  Potassium 3.5 - 5.1 mmol/L  3.4(L) 3.3(L) 3.3(L)  Chloride 98 - 111 mmol/L 103 101 101  CO2 22 - 32 mmol/L _0 Calcium 8.9 - 10.3 mg/dL 9.8 10.1 9.8  Total Protein 6.5 - 8.1 g/dL 7.2 7.5 7.3  Total Bilirubin 0.3 - 1.2 mg/dL 0.3 0.3 0.3  Alkaline Phos 38 - 126 U/L 70 97 79  AST 15 - 41 U/L _1 ALT 0 - 44 U/L _2 Lab Results  Component Value Date   WBC 5.4 03/03/2021   HGB 10.2 (L) 03/03/2021   HCT 29.4 (L) 03/03/2021   MCV 96.4 03/03/2021   PLT 199 03/03/2021   NEUTROABS 3.9 03/03/2021  ASSESSMENT & PLAN:  Malignant neoplasm of upper-outer quadrant of left breast in female, estrogen receptor negative ([place]) 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 Neoadjuvant chemotherapy with Adriamycin and Cytoxan dose dense 4 followed by Taxol weekly 12 with carboplatin every 3 weeks x4 (pembrolizumab will be added if she is triple negative) 2. Followed by breast conserving surgery versus mastectomy and axillary lymph node dissection (due to matted axillary lymph nodes) 3. Followed by adjuvant radiation therapy PET CT scan: Known breast cancer and lymphadenopathy in the axilla, supraclavicular, pectoral, internal mammary lymph nodes, no distant metastatic disease -------------------------------------------------------------------------- Current treatment: Completed 4 cycles of neoadjuvant Adriamycin Cytoxan and Pembrolizumab, today is cycle 7 Taxol, [place] (held carboplatin for platelets of 75)   Toxicities: Denies any nausea or vomiting. 1. Fatigue 2. Alopecia 3.  Hypertension: Monitoring closely. 4.  Chemotherapy-induced anemia: Hemoglobin stable 10.2 5.  Thrombocytopenia: Platelet  count is improved to 199 6.  Leukopenia: Gets Granix every 3 weeks   Monitoring closely for immunotherapy related adverse effects.:  monitoring TSH.   Return to clinic weekly for chemotherapy.        No orders of the defined types were placed in this encounter.  The patient has a good understanding of the overall plan. she agrees with it. she will call with any problems that may develop before the next visit here.  Total time spent: 30 mins including face to face time and time spent for planning, charting and coordination of care  Rulon Eisenmenger, MD, MPH 03/03/2021  I, Thana Ates, am acting as scribe for Dr. Nicholas Lose.  I have reviewed the above documentation for accuracy and completeness, and I agree with the above.

## 2021-03-03 ENCOUNTER — Ambulatory Visit: Payer: Federal, State, Local not specified - PPO | Admitting: Hematology and Oncology

## 2021-03-03 ENCOUNTER — Inpatient Hospital Stay: Payer: Federal, State, Local not specified - PPO

## 2021-03-03 ENCOUNTER — Other Ambulatory Visit: Payer: Federal, State, Local not specified - PPO

## 2021-03-03 ENCOUNTER — Other Ambulatory Visit: Payer: Self-pay

## 2021-03-03 ENCOUNTER — Inpatient Hospital Stay (HOSPITAL_BASED_OUTPATIENT_CLINIC_OR_DEPARTMENT_OTHER): Payer: Federal, State, Local not specified - PPO | Admitting: Hematology and Oncology

## 2021-03-03 ENCOUNTER — Inpatient Hospital Stay: Payer: Federal, State, Local not specified - PPO | Attending: Hematology and Oncology

## 2021-03-03 DIAGNOSIS — Z95828 Presence of other vascular implants and grafts: Secondary | ICD-10-CM

## 2021-03-03 DIAGNOSIS — Z79899 Other long term (current) drug therapy: Secondary | ICD-10-CM | POA: Diagnosis not present

## 2021-03-03 DIAGNOSIS — Z171 Estrogen receptor negative status [ER-]: Secondary | ICD-10-CM | POA: Diagnosis not present

## 2021-03-03 DIAGNOSIS — D6481 Anemia due to antineoplastic chemotherapy: Secondary | ICD-10-CM | POA: Insufficient documentation

## 2021-03-03 DIAGNOSIS — C50412 Malignant neoplasm of upper-outer quadrant of left female breast: Secondary | ICD-10-CM

## 2021-03-03 DIAGNOSIS — Z5112 Encounter for antineoplastic immunotherapy: Secondary | ICD-10-CM | POA: Insufficient documentation

## 2021-03-03 DIAGNOSIS — D72819 Decreased white blood cell count, unspecified: Secondary | ICD-10-CM | POA: Diagnosis not present

## 2021-03-03 DIAGNOSIS — T451X5A Adverse effect of antineoplastic and immunosuppressive drugs, initial encounter: Secondary | ICD-10-CM | POA: Insufficient documentation

## 2021-03-03 DIAGNOSIS — L659 Nonscarring hair loss, unspecified: Secondary | ICD-10-CM | POA: Insufficient documentation

## 2021-03-03 DIAGNOSIS — R5383 Other fatigue: Secondary | ICD-10-CM | POA: Diagnosis not present

## 2021-03-03 DIAGNOSIS — D696 Thrombocytopenia, unspecified: Secondary | ICD-10-CM | POA: Diagnosis not present

## 2021-03-03 DIAGNOSIS — I10 Essential (primary) hypertension: Secondary | ICD-10-CM | POA: Insufficient documentation

## 2021-03-03 DIAGNOSIS — Z5111 Encounter for antineoplastic chemotherapy: Secondary | ICD-10-CM | POA: Diagnosis present

## 2021-03-03 LAB — CBC WITH DIFFERENTIAL (CANCER CENTER ONLY)
Abs Immature Granulocytes: 0.06 10*3/uL (ref 0.00–0.07)
Basophils Absolute: 0 10*3/uL (ref 0.0–0.1)
Basophils Relative: 1 %
Eosinophils Absolute: 0 10*3/uL (ref 0.0–0.5)
Eosinophils Relative: 0 %
HCT: 29.4 % — ABNORMAL LOW (ref 36.0–46.0)
Hemoglobin: 10.2 g/dL — ABNORMAL LOW (ref 12.0–15.0)
Immature Granulocytes: 1 %
Lymphocytes Relative: 14 %
Lymphs Abs: 0.8 10*3/uL (ref 0.7–4.0)
MCH: 33.4 pg (ref 26.0–34.0)
MCHC: 34.7 g/dL (ref 30.0–36.0)
MCV: 96.4 fL (ref 80.0–100.0)
Monocytes Absolute: 0.7 10*3/uL (ref 0.1–1.0)
Monocytes Relative: 13 %
Neutro Abs: 3.9 10*3/uL (ref 1.7–7.7)
Neutrophils Relative %: 71 %
Platelet Count: 199 10*3/uL (ref 150–400)
RBC: 3.05 MIL/uL — ABNORMAL LOW (ref 3.87–5.11)
RDW: 17.1 % — ABNORMAL HIGH (ref 11.5–15.5)
WBC Count: 5.4 10*3/uL (ref 4.0–10.5)
nRBC: 0 % (ref 0.0–0.2)

## 2021-03-03 LAB — CMP (CANCER CENTER ONLY)
ALT: 21 U/L (ref 0–44)
AST: 20 U/L (ref 15–41)
Albumin: 3.7 g/dL (ref 3.5–5.0)
Alkaline Phosphatase: 70 U/L (ref 38–126)
Anion gap: 10 (ref 5–15)
BUN: 27 mg/dL — ABNORMAL HIGH (ref 6–20)
CO2: 28 mmol/L (ref 22–32)
Calcium: 9.8 mg/dL (ref 8.9–10.3)
Chloride: 103 mmol/L (ref 98–111)
Creatinine: 1.08 mg/dL — ABNORMAL HIGH (ref 0.44–1.00)
GFR, Estimated: 59 mL/min — ABNORMAL LOW (ref >60)
Glucose, Bld: 97 mg/dL (ref 70–99)
Potassium: 3.4 mmol/L — ABNORMAL LOW (ref 3.5–5.1)
Sodium: 141 mmol/L (ref 135–145)
Total Bilirubin: 0.3 mg/dL (ref 0.3–1.2)
Total Protein: 7.2 g/dL (ref 6.5–8.1)

## 2021-03-03 LAB — TSH: TSH: 2.047 u[IU]/mL (ref 0.308–3.960)

## 2021-03-03 MED ORDER — SODIUM CHLORIDE 0.9 % IV SOLN
200.0000 mg | Freq: Once | INTRAVENOUS | Status: AC
  Administered 2021-03-03: 200 mg via INTRAVENOUS
  Filled 2021-03-03: qty 8

## 2021-03-03 MED ORDER — SODIUM CHLORIDE 0.9 % IV SOLN
10.0000 mg | Freq: Once | INTRAVENOUS | Status: AC
  Administered 2021-03-03: 10 mg via INTRAVENOUS
  Filled 2021-03-03: qty 10

## 2021-03-03 MED ORDER — SODIUM CHLORIDE 0.9 % IV SOLN
75.0000 mg/m2 | Freq: Once | INTRAVENOUS | Status: AC
  Administered 2021-03-03: 156 mg via INTRAVENOUS
  Filled 2021-03-03: qty 26

## 2021-03-03 MED ORDER — SODIUM CHLORIDE 0.9% FLUSH
10.0000 mL | Freq: Once | INTRAVENOUS | Status: AC
  Administered 2021-03-03: 10 mL

## 2021-03-03 MED ORDER — DIPHENHYDRAMINE HCL 50 MG/ML IJ SOLN
25.0000 mg | Freq: Once | INTRAMUSCULAR | Status: AC
  Administered 2021-03-03: 25 mg via INTRAVENOUS
  Filled 2021-03-03: qty 1

## 2021-03-03 MED ORDER — SODIUM CHLORIDE 0.9 % IV SOLN: Freq: Once | INTRAVENOUS | Status: AC

## 2021-03-03 MED ORDER — PALONOSETRON HCL INJECTION 0.25 MG/5ML
0.2500 mg | Freq: Once | INTRAVENOUS | Status: AC
  Administered 2021-03-03: 0.25 mg via INTRAVENOUS
  Filled 2021-03-03: qty 5

## 2021-03-03 MED ORDER — SODIUM CHLORIDE 0.9% FLUSH
10.0000 mL | INTRAVENOUS | Status: DC | PRN
  Administered 2021-03-03: 10 mL

## 2021-03-03 MED ORDER — FAMOTIDINE 20 MG IN NS 100 ML IVPB
20.0000 mg | Freq: Once | INTRAVENOUS | Status: AC
  Administered 2021-03-03: 20 mg via INTRAVENOUS
  Filled 2021-03-03: qty 100

## 2021-03-03 MED ORDER — HEPARIN SOD (PORK) LOCK FLUSH 100 UNIT/ML IV SOLN
500.0000 [IU] | Freq: Once | INTRAVENOUS | Status: AC | PRN
  Administered 2021-03-03: 500 [IU]

## 2021-03-03 MED ORDER — SODIUM CHLORIDE 0.9 % IV SOLN
150.0000 mg | Freq: Once | INTRAVENOUS | Status: AC
  Administered 2021-03-03: 150 mg via INTRAVENOUS
  Filled 2021-03-03: qty 150

## 2021-03-03 NOTE — Patient Instructions (Signed)
[facility]   Discharge Instructions: Thank you for choosing [facility] to provide your oncology and hematology care.   If you have a lab appointment with the [facility], please go directly to the [facility] and check in at the registration area.   Wear comfortable clothing and clothing appropriate for easy access to any Portacath or PICC line.   We strive to give you quality time with your provider. You may need to reschedule your appointment if you arrive late (15 or more minutes).  Arriving late affects you and other patients whose appointments are after yours.  Also, if you miss three or more appointments without notifying the office, you may be dismissed from the clinic at the provider's discretion.      For prescription refill requests, have your pharmacy contact our office and allow 72 hours for refills to be completed.    Today you received the following chemotherapy and/or immunotherapy agents: Keytruda/ Paclitaxel (Taxol)      To help prevent nausea and vomiting after your treatment, we encourage you to take your nausea medication as directed.  BELOW ARE SYMPTOMS THAT SHOULD BE REPORTED IMMEDIATELY: *FEVER GREATER THAN 100.4 F (38 C) OR HIGHER *CHILLS OR SWEATING *NAUSEA AND VOMITING THAT IS NOT CONTROLLED WITH YOUR NAUSEA MEDICATION *UNUSUAL SHORTNESS OF BREATH *UNUSUAL BRUISING OR BLEEDING *URINARY PROBLEMS (pain or burning when urinating, or frequent urination) *BOWEL PROBLEMS (unusual diarrhea, constipation, pain near the anus) TENDERNESS IN MOUTH AND THROAT WITH OR WITHOUT PRESENCE OF ULCERS (sore throat, sores in mouth, or a toothache) UNUSUAL RASH, SWELLING OR PAIN  UNUSUAL VAGINAL DISCHARGE OR ITCHING   Items with * indicate a potential emergency and should be followed up as soon as possible or go to the Emergency Department if any problems should occur.  Please show the CHEMOTHERAPY ALERT CARD or IMMUNOTHERAPY ALERT  CARD at check-in to the Emergency Department and triage nurse.  Should you have questions after your visit or need to cancel or reschedule your appointment, please contact [facility]  Dept: [phone]  and follow the prompts.  Office hours are 8:00 a.m. to 4:30 p.m. Monday - Friday. Please note that voicemails left after 4:00 p.m. may not be returned until the following business day.  We are closed weekends and major holidays. You have access to a nurse at all times for urgent questions. Please call the main number to the clinic Dept: [phone] and follow the prompts.   For any non-urgent questions, you may also contact your provider using MyChart. We now offer e-Visits for anyone 28 and older to request care online for non-urgent symptoms. For details visit [email or web address].   Also download the MyChart app! Go to the app store, search "MyChart", open the app, select [facility], and log in with your MyChart username and password.  Due to Covid, a mask is required upon entering the hospital/clinic. If you do not have a mask, one will be given to you upon arrival. For doctor visits, patients may have 1 support person aged 10 or older with them. For treatment visits, patients cannot have anyone with them due to current Covid guidelines and our immunocompromised population.

## 2021-03-03 NOTE — Assessment & Plan Note (Signed)
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 Neoadjuvant chemotherapy with Adriamycin and Cytoxan dose dense 4 followed by Taxol weekly 12 with carboplatin every 3 weeks x4(pembrolizumab will be added if she is triple negative) 2. Followed bybreast conserving surgery versusmastectomy andaxillary lymph node dissection (due to matted axillary lymph nodes) 3. Followed by adjuvant radiation therapy PET CT scan: Known breast cancer and lymphadenopathy in the axilla, supraclavicular, pectoral, internal mammary lymph nodes, no distant metastatic disease -------------------------------------------------------------------------- Current treatment:Completed 4 cycles ofneoadjuvant Adriamycin Cytoxan and Pembrolizumab, today is cycle7Taxol, carbo and keytruda(heldcarboplatinfor platelets of 75)  Toxicities: Denies any nausea or vomiting. 1.Fatigue 2.Alopecia 3.Hypertension: Monitoring closely. 4.Chemotherapy-induced anemia: Hemoglobin stable 10.4 5.Thrombocytopenia:Platelet count is improved to 315 6.Leukopenia: Gets Granix prior to each chemo  Monitoring closely for immunotherapy related adverse effects.:monitoringTSH.  Return to clinic weekly for chemotherapy.

## 2021-03-03 NOTE — Patient Instructions (Signed)
Implanted Port Home Guide An implanted port is a device that is placed under the skin. It is usually placed in the chest. The device can be used to give IV medicine, to take blood, or for dialysis. You may have an implanted port if: You need IV medicine that would be irritating to the small veins in your hands or arms. You need IV medicines, such as antibiotics, for a long period of time. You need IV nutrition for a long period of time. You need dialysis. When you have a port, your health care provider can choose to use the port instead of veins in your arms for these procedures. You may have fewer limitations when using a port than you would if you used other types of long-term IVs, and you will likely be able to return to normal activities after your incision heals. An implanted port has two main parts: Reservoir. The reservoir is the part where a needle is inserted to give medicines or draw blood. The reservoir is round. After it is placed, it appears as a small, raised area under your skin. Catheter. The catheter is a thin, flexible tube that connects the reservoir to a vein. Medicine that is inserted into the reservoir goes into the catheter and then into the vein. How is my port accessed? To access your port: A numbing cream may be placed on the skin over the port site. Your health care provider will put on a mask and sterile gloves. The skin over your port will be cleaned carefully with a germ-killing soap and allowed to dry. Your health care provider will gently pinch the port and insert a needle into it. Your health care provider will check for a blood return to make sure the port is in the vein and is not clogged. If your port needs to remain accessed to get medicine continuously (constant infusion), your health care provider will place a clear bandage (dressing) over the needle site. The dressing and needle will need to be changed every week, or as told by your health care provider. What  is flushing? Flushing helps keep the port from getting clogged. Follow instructions from your health care provider about how and when to flush the port. Ports are usually flushed with saline solution or a medicine called heparin. The need for flushing will depend on how the port is used: If the port is only used from time to time to give medicines or draw blood, the port may need to be flushed: Before and after medicines have been given. Before and after blood has been drawn. As part of routine maintenance. Flushing may be recommended every 4-6 weeks. If a constant infusion is running, the port may not need to be flushed. Throw away any syringes in a disposal container that is meant for sharp items (sharps container). You can buy a sharps container from a pharmacy, or you can make one by using an empty hard plastic bottle with a cover. How long will my port stay implanted? The port can stay in for as long as your health care provider thinks it is needed. When it is time for the port to come out, a surgery will be done to remove it. The surgery will be similar to the procedure that was done to put the port in. Follow these instructions at home:  Flush your port as told by your health care provider. If you need an infusion over several days, follow instructions from your health care provider about how   to take care of your port site. Make sure you: Wash your hands with soap and water before you change your dressing. If soap and water are not available, use alcohol-based hand sanitizer. Change your dressing as told by your health care provider. Place any used dressings or infusion bags into a plastic bag. Throw that bag in the trash. Keep the dressing that covers the needle clean and dry. Do not get it wet. Do not use scissors or sharp objects near the tube. Keep the tube clamped, unless it is being used. Check your port site every day for signs of infection. Check for: Redness, swelling, or  pain. Fluid or blood. Pus or a bad smell. Protect the skin around the port site. Avoid wearing bra straps that rub or irritate the site. Protect the skin around your port from seat belts. Place a soft pad over your chest if needed. Bathe or shower as told by your health care provider. The site may get wet as long as you are not actively receiving an infusion. Return to your normal activities as told by your health care provider. Ask your health care provider what activities are safe for you. Carry a medical alert card or wear a medical alert bracelet at all times. This will let health care providers know that you have an implanted port in case of an emergency. Get help right away if: You have redness, swelling, or pain at the port site. You have fluid or blood coming from your port site. You have pus or a bad smell coming from the port site. You have a fever. Summary Implanted ports are usually placed in the chest for long-term IV access. Follow instructions from your health care provider about flushing the port and changing bandages (dressings). Take care of the area around your port by avoiding clothing that puts pressure on the area, and by watching for signs of infection. Protect the skin around your port from seat belts. Place a soft pad over your chest if needed. Get help right away if you have a fever or you have redness, swelling, pain, drainage, or a bad smell at the port site. This information is not intended to replace advice given to you by your health care provider. Make sure you discuss any questions you have with your health care provider. Document Revised: 09/03/2020 Document Reviewed: 10/29/2019 Elsevier Patient Education  2022 [employer]vier Inc.  

## 2021-03-04 ENCOUNTER — Encounter: Payer: Self-pay | Admitting: *Deleted

## 2021-03-04 LAB — T4: T4, Total: 9.3 ug/dL (ref 4.5–12.0)

## 2021-03-07 ENCOUNTER — Ambulatory Visit: Payer: Federal, State, Local not specified - PPO

## 2021-03-10 ENCOUNTER — Telehealth: Payer: Self-pay | Admitting: *Deleted

## 2021-03-10 NOTE — Telephone Encounter (Signed)
Received call from pt and [facility] DDS stating pt is in office currently and is scheduled to undergo a tooth extraction with bone graft.  Per MD okay for pt to proceed with procedure today, pt will skip taxol tx this week and resume next week.  MD advised that DDS prescribe pt antibiotic to prevent any infection post procedure.  [facility] D[facility] verbalized understanding as well as pt.  High priority message sent to scheduling to adjust upcoming appts.

## 2021-03-11 ENCOUNTER — Ambulatory Visit: Payer: Federal, State, Local not specified - PPO

## 2021-03-11 ENCOUNTER — Other Ambulatory Visit: Payer: Federal, State, Local not specified - PPO

## 2021-03-14 ENCOUNTER — Ambulatory Visit: Payer: Federal, State, Local not specified - PPO

## 2021-03-16 MED FILL — Dexamethasone Sodium Phosphate Inj 100 MG/10ML: INTRAMUSCULAR | Qty: 1 | Status: AC

## 2021-03-16 NOTE — Progress Notes (Signed)
Patient Care Team: Riki Sheer, NP as PCP - General (Nurse Practitioner) Mauro Kaufmann, RN as Oncology Nurse Navigator Rockwell Germany, RN as Oncology Nurse Navigator Rolm Bookbinder, MD as Consulting Physician (General Surgery) Nicholas Lose, MD as Consulting Physician (Hematology and Oncology) Kyung Rudd, MD as Consulting Physician (Radiation Oncology)  DIAGNOSIS:    ICD-10-CM   1. Malignant neoplasm of upper-outer quadrant of left breast in female, estrogen receptor negative ([place])  C50.412    Z17.1       SUMMARY OF ONCOLOGIC HISTORY: Oncology History  Malignant neoplasm of upper-outer quadrant of left breast in female, estrogen receptor negative ([place])  09/30/2020 Initial Diagnosis   Swollen left breast: Mammogram detected left breast mass UOQ skin thickening with 3 abnormal lymph nodes that are matted and hard, ultrasound 2.7 cm, 2.9 cm, 3 cm and 3 lymph nodes biopsy revealed grade 2-3 IDC with DCIS ER/PR negative HER-2 IHC equivocal FISH pending, Ki-67 60%, lymph node biopsy positive   10/01/2020 Cancer Staging   Staging form: Breast, AJCC 8th Edition - Clinical stage from 10/01/2020: Stage IIIC (cT4b, cN2a, cM0, G3, ER-, PR-, HER2: Equivocal) - Signed by Nicholas Lose, MD on 10/01/2020 Stage prefix: Initial diagnosis Histologic grading system: 3 grade system   10/13/2020 Genetic Testing   Negative hereditary cancer genetic testing: no pathogenic variants detected in [facility] CustomNext-Cancer +RNAinsight Panel.  The report date is October 13, 2020.   The CustomNext-Cancer+RNAinsight panel offered by [facility] includes sequencing and rearrangement analysis for the following 47 genes:  APC, ATM, AXIN2, BARD1, BMPR1A, BRCA1, BRCA2, BRIP1, CDH1, CDK4, CDKN2A, CHEK2, DICER1, EPCAM, GREM1, HOXB13, MEN1, MLH1, MSH2, MSH3, MSH6, MUTYH, NBN, NF1, NF2, NTHL1, PALB2, PMS2, POLD1, POLE, PTEN, RAD51C, RAD51D, RECQL, RET, SDHA, SDHAF2, SDHB, SDHC, SDHD, SMAD4, SMARCA4, STK11, TP53,  TSC1, TSC2, and VHL.  RNA data is routinely analyzed for use in variant interpretation for all genes.   01/13/2021 -  Chemotherapy    Patient is on Treatment Plan: BREAST PEMBROLIZUMAB + AC Q21D X 4 CYCLES / PEMBROLIZUMAB + CARBOPLATIN D1 + PACLITAXEL D1,8,15 Q21D X 4 CYCLES         CHIEF COMPLIANT: Cycle 8 Taxol (Keytruda every 3 weeks)  INTERVAL HISTORY: Rita Cole is a 60 y.o. with above-mentioned history of left breast cancer currently on neoadjuvant chemotherapy with Taxol Carboplatin and Keytruda. She presents to the clinic for treatment.  She had teeth extracted a couple of weeks ago and was put on antibiotics.  This led to's profound diarrhea.  She has recovered from all that.  ALLERGIES:  has No Known Allergies.  MEDICATIONS:  Current Outpatient Medications  Medication Sig Dispense Refill   benazepril (LOTENSIN) 40 MG tablet Take 40 mg by mouth daily.     cyclobenzaprine (FLEXERIL) 10 MG tablet Take 10 mg by mouth 3 (three) times daily as needed.     hydrochlorothiazide (HYDRODIURIL) 25 MG tablet Take 1 tablet (25 mg total) by mouth daily. 30 tablet 0   ketorolac (TORADOL) 10 MG tablet Take 1 tablet (10 mg total) by mouth every 6 (six) hours as needed. 20 tablet 0   lidocaine-prilocaine (EMLA) cream Apply to affected area once 30 g 3   meloxicam (MOBIC) 15 MG tablet Take 1 tablet (15 mg total) by mouth daily. 10 tablet 0   ondansetron (ZOFRAN) 8 MG tablet Take 1 tablet (8 mg total) by mouth 2 (two) times daily as needed. Start on the third day after carboplatin and AC chemotherapy. 30 tablet  1   prochlorperazine (COMPAZINE) 10 MG tablet Take 1 tablet (10 mg total) by mouth every 6 (six) hours as needed (Nausea or vomiting). 30 tablet 1   tiZANidine (ZANAFLEX) 4 MG tablet Take 4 mg by mouth 3 (three) times daily as needed.     Vitamin D, Ergocalciferol, (DRISDOL) 1.25 MG (50000 UNIT) CAPS capsule Take 50,000 Units by mouth once a week. Monday     No current  facility-administered medications for this visit.    PHYSICAL EXAMINATION: ECOG PERFORMANCE STATUS: 1 - Symptomatic but completely ambulatory  Vitals:   03/17/21 1137  BP: (!) 142/58  Pulse: 82  Resp: 18  Temp: 97.7 F (36.5 C)  SpO2: 99%   Filed Weights   03/17/21 1137  Weight: 211 lb 14.4 oz (96.1 kg)    LABORATORY DATA:  I have reviewed the data as listed CMP Latest Ref Rng & Units 03/17/2021 03/03/2021 02/23/2021  Glucose 70 - 99 mg/dL 117(H) 97 131(H)  BUN 6 - 20 mg/dL 28(H) 27(H) 13  Creatinine 0.44 - 1.00 mg/dL 1.55(H) 1.08(H) 0.90  Sodium 135 - 145 mmol/L 141 141 143  Potassium 3.5 - 5.1 mmol/L 3.7 3.4(L) 3.3(L)  Chloride 98 - 111 mmol/L 101 103 101  CO2 22 - 32 mmol/L $RemoveB'29 28 31  'eqToRbVt$ Calcium 8.9 - 10.3 mg/dL 10.4(H) 9.8 10.1  Total Protein 6.5 - 8.1 g/dL 7.7 7.2 7.5  Total Bilirubin 0.3 - 1.2 mg/dL 0.3 0.3 0.3  Alkaline Phos 38 - 126 U/L 84 70 97  AST 15 - 41 U/L $Remo'22 20 28  'FOtiq$ ALT 0 - 44 U/L $Remo'17 21 26    'fJTzn$ Lab Results  Component Value Date   WBC 8.2 03/17/2021   HGB 10.9 (L) 03/17/2021   HCT 32.3 (L) 03/17/2021   MCV 95.8 03/17/2021   PLT 267 03/17/2021   NEUTROABS 5.7 03/17/2021    ASSESSMENT & PLAN:  Malignant neoplasm of upper-outer quadrant of left breast in female, estrogen receptor negative ([place]) 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 Neoadjuvant chemotherapy with Adriamycin and Cytoxan dose dense 4 followed by Taxol weekly 12 with carboplatin every 3 weeks x4 (pembrolizumab will be added if she is triple negative) 2. Followed by breast conserving surgery versus mastectomy and axillary lymph node dissection (due to matted axillary lymph nodes) 3. Followed by  adjuvant radiation therapy PET CT scan: Known breast cancer and lymphadenopathy in the axilla, supraclavicular, pectoral, internal mammary lymph nodes, no distant metastatic disease -------------------------------------------------------------------------- Current treatment: Completed 4 cycles of neoadjuvant Adriamycin Cytoxan and Pembrolizumab, today is cycle 8 Taxol, [place] (held carboplatin for platelets of 75)   Toxicities: Denies any nausea or vomiting. 1. Fatigue 2. Alopecia 3.  Hypertension: Monitoring closely. 4.  Chemotherapy-induced anemia: Hemoglobin stable 10.9 5.  Thrombocytopenia: Platelet count   6.  Leukopenia: Gets Granix every 3 weeks 7.  Renal insufficiency: Unclear etiology suspect this is because of decreased oral intake.  We will add 1 L of normal saline to her treatment today. She had her teeth extracted recently.  This is helped significantly with the headache.  She had diarrhea as a result of antibiotics.  This is a likely cause of her renal insufficiency.  Monitoring closely for immunotherapy related adverse effects.:  monitoring TSH.   Return to clinic weekly for chemotherapy.      No orders of the defined types were placed in this encounter.  The patient has a good understanding of the overall plan. she agrees with it. she will call with any problems that may develop before the next visit here.  Total time spent: 30 mins including face to face time and time spent for planning, charting and coordination of care  Rulon Eisenmenger, MD, MPH 03/17/2021  I, Thana Ates, am acting as scribe for Dr. Nicholas Lose.  I have reviewed the above documentation for accuracy and completeness, and I agree with the above.

## 2021-03-17 ENCOUNTER — Inpatient Hospital Stay: Payer: Federal, State, Local not specified - PPO

## 2021-03-17 ENCOUNTER — Other Ambulatory Visit: Payer: Self-pay

## 2021-03-17 ENCOUNTER — Inpatient Hospital Stay (HOSPITAL_BASED_OUTPATIENT_CLINIC_OR_DEPARTMENT_OTHER): Payer: Federal, State, Local not specified - PPO | Admitting: Hematology and Oncology

## 2021-03-17 DIAGNOSIS — C50412 Malignant neoplasm of upper-outer quadrant of left female breast: Secondary | ICD-10-CM | POA: Diagnosis not present

## 2021-03-17 DIAGNOSIS — Z171 Estrogen receptor negative status [ER-]: Secondary | ICD-10-CM

## 2021-03-17 DIAGNOSIS — Z95828 Presence of other vascular implants and grafts: Secondary | ICD-10-CM

## 2021-03-17 LAB — CBC WITH DIFFERENTIAL (CANCER CENTER ONLY)
Abs Immature Granulocytes: 0.05 10*3/uL (ref 0.00–0.07)
Basophils Absolute: 0.1 10*3/uL (ref 0.0–0.1)
Basophils Relative: 1 %
Eosinophils Absolute: 0.1 10*3/uL (ref 0.0–0.5)
Eosinophils Relative: 1 %
HCT: 32.3 % — ABNORMAL LOW (ref 36.0–46.0)
Hemoglobin: 10.9 g/dL — ABNORMAL LOW (ref 12.0–15.0)
Immature Granulocytes: 1 %
Lymphocytes Relative: 9 %
Lymphs Abs: 0.7 10*3/uL (ref 0.7–4.0)
MCH: 32.3 pg (ref 26.0–34.0)
MCHC: 33.7 g/dL (ref 30.0–36.0)
MCV: 95.8 fL (ref 80.0–100.0)
Monocytes Absolute: 1.5 10*3/uL — ABNORMAL HIGH (ref 0.1–1.0)
Monocytes Relative: 19 %
Neutro Abs: 5.7 10*3/uL (ref 1.7–7.7)
Neutrophils Relative %: 69 %
Platelet Count: 267 10*3/uL (ref 150–400)
RBC: 3.37 MIL/uL — ABNORMAL LOW (ref 3.87–5.11)
RDW: 16.4 % — ABNORMAL HIGH (ref 11.5–15.5)
WBC Count: 8.2 10*3/uL (ref 4.0–10.5)
nRBC: 0 % (ref 0.0–0.2)

## 2021-03-17 LAB — CMP (CANCER CENTER ONLY)
ALT: 17 U/L (ref 0–44)
AST: 22 U/L (ref 15–41)
Albumin: 3.9 g/dL (ref 3.5–5.0)
Alkaline Phosphatase: 84 U/L (ref 38–126)
Anion gap: 11 (ref 5–15)
BUN: 28 mg/dL — ABNORMAL HIGH (ref 6–20)
CO2: 29 mmol/L (ref 22–32)
Calcium: 10.4 mg/dL — ABNORMAL HIGH (ref 8.9–10.3)
Chloride: 101 mmol/L (ref 98–111)
Creatinine: 1.55 mg/dL — ABNORMAL HIGH (ref 0.44–1.00)
GFR, Estimated: 38 mL/min — ABNORMAL LOW (ref >60)
Glucose, Bld: 117 mg/dL — ABNORMAL HIGH (ref 70–99)
Potassium: 3.7 mmol/L (ref 3.5–5.1)
Sodium: 141 mmol/L (ref 135–145)
Total Bilirubin: 0.3 mg/dL (ref 0.3–1.2)
Total Protein: 7.7 g/dL (ref 6.5–8.1)

## 2021-03-17 LAB — TSH: TSH: 2.996 u[IU]/mL (ref 0.308–3.960)

## 2021-03-17 MED ORDER — FAMOTIDINE 20 MG IN NS 100 ML IVPB
20.0000 mg | Freq: Once | INTRAVENOUS | Status: AC
  Administered 2021-03-17: 20 mg via INTRAVENOUS
  Filled 2021-03-17: qty 100

## 2021-03-17 MED ORDER — HEPARIN SOD (PORK) LOCK FLUSH 100 UNIT/ML IV SOLN
500.0000 [IU] | Freq: Once | INTRAVENOUS | Status: AC | PRN
  Administered 2021-03-17: 500 [IU]

## 2021-03-17 MED ORDER — SODIUM CHLORIDE 0.9% FLUSH
10.0000 mL | Freq: Once | INTRAVENOUS | Status: AC
  Administered 2021-03-17: 10 mL

## 2021-03-17 MED ORDER — SODIUM CHLORIDE 0.9 % IV SOLN
75.0000 mg/m2 | Freq: Once | INTRAVENOUS | Status: AC
  Administered 2021-03-17: 156 mg via INTRAVENOUS
  Filled 2021-03-17: qty 26

## 2021-03-17 MED ORDER — DIPHENHYDRAMINE HCL 50 MG/ML IJ SOLN
25.0000 mg | Freq: Once | INTRAMUSCULAR | Status: AC
  Administered 2021-03-17: 25 mg via INTRAVENOUS
  Filled 2021-03-17: qty 1

## 2021-03-17 MED ORDER — SODIUM CHLORIDE 0.9% FLUSH
10.0000 mL | INTRAVENOUS | Status: DC | PRN
  Administered 2021-03-17: 10 mL

## 2021-03-17 MED ORDER — SODIUM CHLORIDE 0.9 % IV SOLN
Freq: Once | INTRAVENOUS | Status: AC
Start: 2021-03-17 — End: 2021-03-17

## 2021-03-17 MED ORDER — SODIUM CHLORIDE 0.9 % IV SOLN
10.0000 mg | Freq: Once | INTRAVENOUS | Status: AC
  Administered 2021-03-17: 10 mg via INTRAVENOUS
  Filled 2021-03-17: qty 10

## 2021-03-17 MED ORDER — SODIUM CHLORIDE 0.9 % IV SOLN: Freq: Once | INTRAVENOUS | Status: AC

## 2021-03-17 NOTE — Assessment & Plan Note (Signed)
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 Neoadjuvant chemotherapy with Adriamycin and Cytoxan dose dense 4 followed by Taxol weekly 12 with carboplatin every 3 weeks x4(pembrolizumab will be added if she is triple negative) 2. Followed bybreast conserving surgery versusmastectomy andaxillary lymph node dissection (due to matted axillary lymph nodes) 3. Followed by adjuvant radiation therapy PET CT scan: Known breast cancer and lymphadenopathy in the axilla, supraclavicular, pectoral, internal mammary lymph nodes, no distant metastatic disease -------------------------------------------------------------------------- Current treatment:Completed 4 cycles ofneoadjuvant Adriamycin Cytoxan and Pembrolizumab, today is cycle8Taxol, carbo and keytruda(heldcarboplatinfor platelets of 75)  Toxicities: Denies any nausea or vomiting. 1.Fatigue 2.Alopecia 3.Hypertension: Monitoring closely. 4.Chemotherapy-induced anemia: Hemoglobin stable 10.2 5.Thrombocytopenia:Platelet count   6.Leukopenia:Gets Granix every 3 weeks  Monitoring closely for immunotherapy related adverse effects.:monitoringTSH.  Return to clinic weekly for chemotherapy.

## 2021-03-17 NOTE — Patient Instructions (Signed)
[facility]   Discharge Instructions: Thank you for choosing [facility] to provide your oncology and hematology care.   If you have a lab appointment with the [facility], please go directly to the [facility] and check in at the registration area.   Wear comfortable clothing and clothing appropriate for easy access to any Portacath or PICC line.   We strive to give you quality time with your provider. You may need to reschedule your appointment if you arrive late (15 or more minutes).  Arriving late affects you and other patients whose appointments are after yours.  Also, if you miss three or more appointments without notifying the office, you may be dismissed from the clinic at the provider's discretion.      For prescription refill requests, have your pharmacy contact our office and allow 72 hours for refills to be completed.    Today you received the following chemotherapy and/or immunotherapy agents: Paclitaxel (Taxol)      To help prevent nausea and vomiting after your treatment, we encourage you to take your nausea medication as directed.  BELOW ARE SYMPTOMS THAT SHOULD BE REPORTED IMMEDIATELY: *FEVER GREATER THAN 100.4 F (38 C) OR HIGHER *CHILLS OR SWEATING *NAUSEA AND VOMITING THAT IS NOT CONTROLLED WITH YOUR NAUSEA MEDICATION *UNUSUAL SHORTNESS OF BREATH *UNUSUAL BRUISING OR BLEEDING *URINARY PROBLEMS (pain or burning when urinating, or frequent urination) *BOWEL PROBLEMS (unusual diarrhea, constipation, pain near the anus) TENDERNESS IN MOUTH AND THROAT WITH OR WITHOUT PRESENCE OF ULCERS (sore throat, sores in mouth, or a toothache) UNUSUAL RASH, SWELLING OR PAIN  UNUSUAL VAGINAL DISCHARGE OR ITCHING   Items with * indicate a potential emergency and should be followed up as soon as possible or go to the Emergency Department if any problems should occur.  Please show the CHEMOTHERAPY ALERT CARD or IMMUNOTHERAPY ALERT CARD at  check-in to the Emergency Department and triage nurse.  Should you have questions after your visit or need to cancel or reschedule your appointment, please contact [facility]  Dept: [phone]  and follow the prompts.  Office hours are 8:00 a.m. to 4:30 p.m. Monday - Friday. Please note that voicemails left after 4:00 p.m. may not be returned until the following business day.  We are closed weekends and major holidays. You have access to a nurse at all times for urgent questions. Please call the main number to the clinic Dept: [phone] and follow the prompts.   For any non-urgent questions, you may also contact your provider using MyChart. We now offer e-Visits for anyone 27 and older to request care online for non-urgent symptoms. For details visit [email or web address].   Also download the MyChart app! Go to the app store, search "MyChart", open the app, select [facility], and log in with your MyChart username and password.  Due to Covid, a mask is required upon entering the hospital/clinic. If you do not have a mask, one will be given to you upon arrival. For doctor visits, patients may have 1 support person aged 46 or older with them. For treatment visits, patients cannot have anyone with them due to current Covid guidelines and our immunocompromised population.

## 2021-03-17 NOTE — Progress Notes (Signed)
OK to treat with scr 1.55 Per Dr Lindi Adie

## 2021-03-18 LAB — T4: T4, Total: 8.8 ug/dL (ref 4.5–12.0)

## 2021-03-21 ENCOUNTER — Ambulatory Visit: Payer: Federal, State, Local not specified - PPO

## 2021-03-23 ENCOUNTER — Encounter: Payer: Self-pay | Admitting: *Deleted

## 2021-03-23 MED FILL — Dexamethasone Sodium Phosphate Inj 100 MG/10ML: INTRAMUSCULAR | Qty: 1 | Status: AC

## 2021-03-24 ENCOUNTER — Inpatient Hospital Stay: Payer: Federal, State, Local not specified - PPO

## 2021-03-24 ENCOUNTER — Other Ambulatory Visit: Payer: Self-pay

## 2021-03-24 VITALS — BP 142/78 | HR 72 | Temp 98.5°F | Resp 18 | Wt 211.8 lb

## 2021-03-24 DIAGNOSIS — C50412 Malignant neoplasm of upper-outer quadrant of left female breast: Secondary | ICD-10-CM

## 2021-03-24 DIAGNOSIS — Z95828 Presence of other vascular implants and grafts: Secondary | ICD-10-CM

## 2021-03-24 LAB — CMP (CANCER CENTER ONLY)
ALT: 20 U/L (ref 0–44)
AST: 21 U/L (ref 15–41)
Albumin: 3.8 g/dL (ref 3.5–5.0)
Alkaline Phosphatase: 74 U/L (ref 38–126)
Anion gap: 11 (ref 5–15)
BUN: 25 mg/dL — ABNORMAL HIGH (ref 6–20)
CO2: 28 mmol/L (ref 22–32)
Calcium: 9.9 mg/dL (ref 8.9–10.3)
Chloride: 105 mmol/L (ref 98–111)
Creatinine: 1.19 mg/dL — ABNORMAL HIGH (ref 0.44–1.00)
GFR, Estimated: 53 mL/min — ABNORMAL LOW (ref >60)
Glucose, Bld: 107 mg/dL — ABNORMAL HIGH (ref 70–99)
Potassium: 3.6 mmol/L (ref 3.5–5.1)
Sodium: 144 mmol/L (ref 135–145)
Total Bilirubin: 0.3 mg/dL (ref 0.3–1.2)
Total Protein: 7.4 g/dL (ref 6.5–8.1)

## 2021-03-24 LAB — CBC WITH DIFFERENTIAL (CANCER CENTER ONLY)
Abs Immature Granulocytes: 0.05 10*3/uL (ref 0.00–0.07)
Basophils Absolute: 0.1 10*3/uL (ref 0.0–0.1)
Basophils Relative: 1 %
Eosinophils Absolute: 0.2 10*3/uL (ref 0.0–0.5)
Eosinophils Relative: 3 %
HCT: 30.3 % — ABNORMAL LOW (ref 36.0–46.0)
Hemoglobin: 10.3 g/dL — ABNORMAL LOW (ref 12.0–15.0)
Immature Granulocytes: 1 %
Lymphocytes Relative: 12 %
Lymphs Abs: 0.7 10*3/uL (ref 0.7–4.0)
MCH: 32.5 pg (ref 26.0–34.0)
MCHC: 34 g/dL (ref 30.0–36.0)
MCV: 95.6 fL (ref 80.0–100.0)
Monocytes Absolute: 0.6 10*3/uL (ref 0.1–1.0)
Monocytes Relative: 9 %
Neutro Abs: 4.8 10*3/uL (ref 1.7–7.7)
Neutrophils Relative %: 74 %
Platelet Count: 195 10*3/uL (ref 150–400)
RBC: 3.17 MIL/uL — ABNORMAL LOW (ref 3.87–5.11)
RDW: 15.6 % — ABNORMAL HIGH (ref 11.5–15.5)
WBC Count: 6.4 10*3/uL (ref 4.0–10.5)
nRBC: 0 % (ref 0.0–0.2)

## 2021-03-24 LAB — TSH: TSH: 3.191 u[IU]/mL (ref 0.308–3.960)

## 2021-03-24 MED ORDER — SODIUM CHLORIDE 0.9% FLUSH
10.0000 mL | INTRAVENOUS | Status: DC | PRN
  Administered 2021-03-24: 10 mL

## 2021-03-24 MED ORDER — DIPHENHYDRAMINE HCL 50 MG/ML IJ SOLN
25.0000 mg | Freq: Once | INTRAMUSCULAR | Status: AC
  Administered 2021-03-24: 25 mg via INTRAVENOUS
  Filled 2021-03-24: qty 1

## 2021-03-24 MED ORDER — SODIUM CHLORIDE 0.9 % IV SOLN: Freq: Once | INTRAVENOUS | Status: AC

## 2021-03-24 MED ORDER — SODIUM CHLORIDE 0.9 % IV SOLN
10.0000 mg | Freq: Once | INTRAVENOUS | Status: AC
  Administered 2021-03-24: 10 mg via INTRAVENOUS
  Filled 2021-03-24: qty 1
  Filled 2021-03-24: qty 10

## 2021-03-24 MED ORDER — SODIUM CHLORIDE 0.9% FLUSH
10.0000 mL | Freq: Once | INTRAVENOUS | Status: AC
  Administered 2021-03-24: 10 mL

## 2021-03-24 MED ORDER — HEPARIN SOD (PORK) LOCK FLUSH 100 UNIT/ML IV SOLN
500.0000 [IU] | Freq: Once | INTRAVENOUS | Status: AC | PRN
  Administered 2021-03-24: 500 [IU]

## 2021-03-24 MED ORDER — SODIUM CHLORIDE 0.9 % IV SOLN
75.0000 mg/m2 | Freq: Once | INTRAVENOUS | Status: AC
  Administered 2021-03-24: 156 mg via INTRAVENOUS
  Filled 2021-03-24: qty 26

## 2021-03-24 MED ORDER — FAMOTIDINE 20 MG IN NS 100 ML IVPB
20.0000 mg | Freq: Once | INTRAVENOUS | Status: AC
  Administered 2021-03-24: 20 mg via INTRAVENOUS
  Filled 2021-03-24: qty 100

## 2021-03-24 NOTE — Patient Instructions (Signed)
[facility]  Discharge Instructions: Thank you for choosing [facility] to provide your oncology and hematology care.   If you have a lab appointment with the [facility], please go directly to the [facility] and check in at the registration area.   Wear comfortable clothing and clothing appropriate for easy access to any Portacath or PICC line.   We strive to give you quality time with your provider. You may need to reschedule your appointment if you arrive late (15 or more minutes).  Arriving late affects you and other patients whose appointments are after yours.  Also, if you miss three or more appointments without notifying the office, you may be dismissed from the clinic at the provider's discretion.      For prescription refill requests, have your pharmacy contact our office and allow 72 hours for refills to be completed.    Today you received the following chemotherapy and/or immunotherapy agents: Taxol    To help prevent nausea and vomiting after your treatment, we encourage you to take your nausea medication as directed.  BELOW ARE SYMPTOMS THAT SHOULD BE REPORTED IMMEDIATELY: *FEVER GREATER THAN 100.4 F (38 C) OR HIGHER *CHILLS OR SWEATING *NAUSEA AND VOMITING THAT IS NOT CONTROLLED WITH YOUR NAUSEA MEDICATION *UNUSUAL SHORTNESS OF BREATH *UNUSUAL BRUISING OR BLEEDING *URINARY PROBLEMS (pain or burning when urinating, or frequent urination) *BOWEL PROBLEMS (unusual diarrhea, constipation, pain near the anus) TENDERNESS IN MOUTH AND THROAT WITH OR WITHOUT PRESENCE OF ULCERS (sore throat, sores in mouth, or a toothache) UNUSUAL RASH, SWELLING OR PAIN  UNUSUAL VAGINAL DISCHARGE OR ITCHING   Items with * indicate a potential emergency and should be followed up as soon as possible or go to the Emergency Department if any problems should occur.  Please show the CHEMOTHERAPY ALERT CARD or IMMUNOTHERAPY ALERT CARD at check-in to the  Emergency Department and triage nurse.  Should you have questions after your visit or need to cancel or reschedule your appointment, please contact [facility]  Dept: [phone]  and follow the prompts.  Office hours are 8:00 a.m. to 4:30 p.m. Monday - Friday. Please note that voicemails left after 4:00 p.m. may not be returned until the following business day.  We are closed weekends and major holidays. You have access to a nurse at all times for urgent questions. Please call the main number to the clinic Dept: [phone] and follow the prompts.   For any non-urgent questions, you may also contact your provider using MyChart. We now offer e-Visits for anyone 70 and older to request care online for non-urgent symptoms. For details visit [email or web address].   Also download the MyChart app! Go to the app store, search "MyChart", open the app, select [facility], and log in with your MyChart username and password.  Due to Covid, a mask is required upon entering the hospital/clinic. If you do not have a mask, one will be given to you upon arrival. For doctor visits, patients may have 1 support person aged 25 or older with them. For treatment visits, patients cannot have anyone with them due to current Covid guidelines and our immunocompromised population.

## 2021-03-25 LAB — T4: T4, Total: 9.5 ug/dL (ref 4.5–12.0)

## 2021-03-28 ENCOUNTER — Other Ambulatory Visit: Payer: Self-pay

## 2021-03-28 ENCOUNTER — Inpatient Hospital Stay: Payer: Federal, State, Local not specified - PPO | Attending: Hematology and Oncology

## 2021-03-28 VITALS — BP 157/78 | HR 87 | Temp 97.7°F | Resp 18

## 2021-03-28 DIAGNOSIS — D72819 Decreased white blood cell count, unspecified: Secondary | ICD-10-CM | POA: Insufficient documentation

## 2021-03-28 DIAGNOSIS — G629 Polyneuropathy, unspecified: Secondary | ICD-10-CM | POA: Diagnosis not present

## 2021-03-28 DIAGNOSIS — C50412 Malignant neoplasm of upper-outer quadrant of left female breast: Secondary | ICD-10-CM | POA: Insufficient documentation

## 2021-03-28 DIAGNOSIS — Z171 Estrogen receptor negative status [ER-]: Secondary | ICD-10-CM | POA: Insufficient documentation

## 2021-03-28 DIAGNOSIS — I10 Essential (primary) hypertension: Secondary | ICD-10-CM | POA: Diagnosis not present

## 2021-03-28 DIAGNOSIS — N289 Disorder of kidney and ureter, unspecified: Secondary | ICD-10-CM | POA: Insufficient documentation

## 2021-03-28 DIAGNOSIS — R5383 Other fatigue: Secondary | ICD-10-CM | POA: Diagnosis not present

## 2021-03-28 DIAGNOSIS — L659 Nonscarring hair loss, unspecified: Secondary | ICD-10-CM | POA: Diagnosis not present

## 2021-03-28 DIAGNOSIS — T451X5A Adverse effect of antineoplastic and immunosuppressive drugs, initial encounter: Secondary | ICD-10-CM | POA: Diagnosis not present

## 2021-03-28 DIAGNOSIS — Z79899 Other long term (current) drug therapy: Secondary | ICD-10-CM | POA: Insufficient documentation

## 2021-03-28 DIAGNOSIS — Z5111 Encounter for antineoplastic chemotherapy: Secondary | ICD-10-CM | POA: Insufficient documentation

## 2021-03-28 DIAGNOSIS — Z5112 Encounter for antineoplastic immunotherapy: Secondary | ICD-10-CM | POA: Diagnosis present

## 2021-03-28 DIAGNOSIS — D6481 Anemia due to antineoplastic chemotherapy: Secondary | ICD-10-CM | POA: Diagnosis not present

## 2021-03-28 DIAGNOSIS — D696 Thrombocytopenia, unspecified: Secondary | ICD-10-CM | POA: Diagnosis not present

## 2021-03-28 MED ORDER — FILGRASTIM-AAFI 480 MCG/0.8ML IJ SOSY
PREFILLED_SYRINGE | INTRAMUSCULAR | Status: AC
  Filled 2021-03-28: qty 0.8

## 2021-03-28 MED ORDER — FILGRASTIM-AAFI 480 MCG/0.8ML IJ SOSY
480.0000 ug | PREFILLED_SYRINGE | Freq: Once | INTRAMUSCULAR | Status: AC
  Administered 2021-03-28: 480 ug via SUBCUTANEOUS

## 2021-03-30 NOTE — Progress Notes (Signed)
Patient Care Team: Shanna Cisco, NP as PCP - General (Nurse Practitioner) Pershing Proud, RN as Oncology Nurse Navigator Donnelly Angelica, RN as Oncology Nurse Navigator Emelia Loron, MD as Consulting Physician (General Surgery) Serena Croissant, MD as Consulting Physician (Hematology and Oncology) Dorothy Puffer, MD as Consulting Physician (Radiation Oncology)  DIAGNOSIS:    ICD-10-CM   1. Malignant neoplasm of upper-outer quadrant of left breast in female, estrogen receptor negative ([facility])  C50.412 MR BREAST BILATERAL W WO CONTRAST INC CAD   Z17.1       SUMMARY OF ONCOLOGIC HISTORY: Oncology History  Malignant neoplasm of upper-outer quadrant of left breast in female, estrogen receptor negative ([facility])  09/30/2020 Initial Diagnosis   Swollen left breast: Mammogram detected left breast mass UOQ skin thickening with 3 abnormal lymph nodes that are matted and hard, ultrasound 2.7 cm, 2.9 cm, 3 cm and 3 lymph nodes biopsy revealed grade 2-3 IDC with DCIS ER/PR negative HER-2 IHC equivocal FISH pending, Ki-67 60%, lymph node biopsy positive   10/01/2020 Cancer Staging   Staging form: Breast, AJCC 8th Edition - Clinical stage from 10/01/2020: Stage IIIC (cT4b, cN2a, cM0, G3, ER-, PR-, HER2: Equivocal) - Signed by Serena Croissant, MD on 10/01/2020 Stage prefix: Initial diagnosis Histologic grading system: 3 grade system   10/13/2020 Genetic Testing   Negative hereditary cancer genetic testing: no pathogenic variants detected in [facility] CustomNext-Cancer +RNAinsight Panel.  The report date is October 13, 2020.   The CustomNext-Cancer+RNAinsight panel offered by [facility] includes sequencing and rearrangement analysis for the following 47 genes:  APC, ATM, AXIN2, BARD1, BMPR1A, BRCA1, BRCA2, BRIP1, CDH1, CDK4, CDKN2A, CHEK2, DICER1, EPCAM, GREM1, HOXB13, MEN1, MLH1, MSH2, MSH3, MSH6, MUTYH, NBN, NF1, NF2, NTHL1, PALB2, PMS2, POLD1, POLE, PTEN, RAD51C, RAD51D, RECQL, RET, SDHA, SDHAF2, SDHB,  SDHC, SDHD, SMAD4, SMARCA4, STK11, TP53, TSC1, TSC2, and VHL.  RNA data is routinely analyzed for use in variant interpretation for all genes.   01/13/2021 -  Chemotherapy   Patient is on Treatment Plan : BREAST Pembrolizumab + AC q21d x 4 cycles / Pembrolizumab + Carboplatin D1 + Paclitaxel D1,8,15 q21d X 4 cycles       CHIEF COMPLIANT: Cycle 10 Taxol, adding carboplatin AUC 2 today (Keytruda every 3 weeks)  INTERVAL HISTORY: Rita Cole is a 60 y.o. with above-mentioned history of left breast cancer currently on neoadjuvant chemotherapy with Taxol  and Keytruda. She presents to the clinic for treatment.  We held carboplatin because of prior history of thrombocytopenia.  She comes in today for cycle 10 of Taxol.  We had previously discontinued carboplatin because of severe thrombocytopenia.  However today we would like to reinstitute it at a very low dose before she completes her neoadjuvant chemo.  She tolerated last week's treatment with Keytruda.  Denies any nausea or vomiting.  Generally feels well without any fatigue.  Denies neuropathy.  ALLERGIES:  has No Known Allergies.  MEDICATIONS:  Current Outpatient Medications  Medication Sig Dispense Refill   amLODipine (NORVASC) 10 MG tablet Take 1 tablet (10 mg total) by mouth daily.     benazepril (LOTENSIN) 40 MG tablet Take 40 mg by mouth daily.     cyclobenzaprine (FLEXERIL) 10 MG tablet Take 10 mg by mouth 3 (three) times daily as needed.     hydrochlorothiazide (HYDRODIURIL) 25 MG tablet Take 1 tablet (25 mg total) by mouth daily. 30 tablet 0   ketorolac (TORADOL) 10 MG tablet Take 1 tablet (10 mg total) by mouth  every 6 (six) hours as needed. 20 tablet 0   lidocaine-prilocaine (EMLA) cream Apply to affected area once 30 g 3   meloxicam (MOBIC) 15 MG tablet Take 1 tablet (15 mg total) by mouth daily. 10 tablet 0   ondansetron (ZOFRAN) 8 MG tablet Take 1 tablet (8 mg total) by mouth 2 (two) times daily as needed. Start on the  third day after carboplatin and AC chemotherapy. 30 tablet 1   prochlorperazine (COMPAZINE) 10 MG tablet Take 1 tablet (10 mg total) by mouth every 6 (six) hours as needed (Nausea or vomiting). 30 tablet 1   tiZANidine (ZANAFLEX) 4 MG tablet Take 4 mg by mouth 3 (three) times daily as needed.     Vitamin D, Ergocalciferol, (DRISDOL) 1.25 MG (50000 UNIT) CAPS capsule Take 50,000 Units by mouth once a week. Monday     No current facility-administered medications for this visit.    PHYSICAL EXAMINATION: ECOG PERFORMANCE STATUS: 1 - Symptomatic but completely ambulatory  Vitals:   03/31/21 0802  BP: (!) 159/81  Pulse: 91  Resp: 18  Temp: (!) 97.3 F (36.3 C)  SpO2: 98%   Filed Weights   03/31/21 0802  Weight: 214 lb 12.8 oz (97.4 kg)    LABORATORY DATA:  I have reviewed the data as listed CMP Latest Ref Rng & Units 03/24/2021 03/17/2021 03/03/2021  Glucose 70 - 99 mg/dL 107(H) 117(H) 97  BUN 6 - 20 mg/dL 25(H) 28(H) 27(H)  Creatinine 0.44 - 1.00 mg/dL 1.19(H) 1.55(H) 1.08(H)  Sodium 135 - 145 mmol/L 144 141 141  Potassium 3.5 - 5.1 mmol/L 3.6 3.7 3.4(L)  Chloride 98 - 111 mmol/L 105 101 103  CO2 22 - 32 mmol/L $RemoveB'28 29 28  'nvbKfuCl$ Calcium 8.9 - 10.3 mg/dL 9.9 10.4(H) 9.8  Total Protein 6.5 - 8.1 g/dL 7.4 7.7 7.2  Total Bilirubin 0.3 - 1.2 mg/dL 0.3 0.3 0.3  Alkaline Phos 38 - 126 U/L 74 84 70  AST 15 - 41 U/L $Remo'21 22 20  'vsZfL$ ALT 0 - 44 U/L $Remo'20 17 21    'XftGK$ Lab Results  Component Value Date   WBC 15.3 (H) 03/31/2021   HGB 9.8 (L) 03/31/2021   HCT 29.3 (L) 03/31/2021   MCV 96.4 03/31/2021   PLT 202 03/31/2021   NEUTROABS PENDING 03/31/2021    ASSESSMENT & PLAN:  Malignant neoplasm of upper-outer quadrant of left breast in female, estrogen receptor negative ([place]) 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 Neoadjuvant chemotherapy with Adriamycin and Cytoxan dose dense 4 followed by Taxol weekly 12 with carboplatin every 3 weeks x4 (pembrolizumab will be added if she is triple negative) 2. Followed by breast conserving surgery versus mastectomy and axillary lymph node dissection (due to matted axillary lymph nodes) 3. Followed by adjuvant radiation therapy PET CT scan: Known breast cancer and lymphadenopathy in the axilla, supraclavicular, pectoral, internal mammary lymph nodes, no distant metastatic disease -------------------------------------------------------------------------- Current treatment: Completed 4 cycles of neoadjuvant Adriamycin Cytoxan and Pembrolizumab, today is cycle 10 Taxol, carbo and keytruda (holding carboplatin for platelets of 75)   Toxicities: Denies any nausea or vomiting. 1. Fatigue 2. Alopecia 3.  Hypertension: Monitoring closely.  Amlodipine was added to her  treatment. 4.  Chemotherapy-induced anemia: Hemoglobin 9.8 5.  Thrombocytopenia: Platelet count 202 6.  Leukopenia: Gets Granix every 3 weeks 7.  Renal insufficiency: Monitoring closely.   Monitoring closely for immunotherapy related adverse effects.:  monitoring TSH. I sent a message to Dr. Donne Hazel to see her after the breast MRI. Return to clinic weekly for chemotherapy.      Orders Placed This Encounter  Procedures   MR BREAST BILATERAL W WO CONTRAST INC CAD    Standing Status:   Future    Standing Expiration Date:   03/31/2022    Order Specific Question:   If indicated for the ordered procedure, I authorize the administration of contrast media per Radiology protocol    Answer:   Yes    Order Specific Question:   What is the patient's sedation requirement?    Answer:   No Sedation    Order Specific Question:   Does the patient have a pacemaker or implanted devices?    Answer:   No     Order Specific Question:   Preferred imaging location?    Answer:   [facility] (table limit-550lbs)    Order Specific Question:   Release to patient    Answer:   Immediate    The patient has a good understanding of the overall plan. she agrees with it. she will call with any problems that may develop before the next visit here.  Total time spent: 30 mins including face to face time and time spent for planning, charting and coordination of care  Rulon Eisenmenger, MD, MPH 03/31/2021  I, Thana Ates, am acting as scribe for Dr. Nicholas Lose.  I have reviewed the above documentation for accuracy and completeness, and I agree with the above.

## 2021-03-31 ENCOUNTER — Encounter: Payer: Self-pay | Admitting: *Deleted

## 2021-03-31 ENCOUNTER — Other Ambulatory Visit: Payer: Self-pay

## 2021-03-31 ENCOUNTER — Inpatient Hospital Stay (HOSPITAL_BASED_OUTPATIENT_CLINIC_OR_DEPARTMENT_OTHER): Payer: Federal, State, Local not specified - PPO | Admitting: Hematology and Oncology

## 2021-03-31 ENCOUNTER — Inpatient Hospital Stay: Payer: Federal, State, Local not specified - PPO

## 2021-03-31 DIAGNOSIS — Z171 Estrogen receptor negative status [ER-]: Secondary | ICD-10-CM | POA: Diagnosis not present

## 2021-03-31 DIAGNOSIS — C50412 Malignant neoplasm of upper-outer quadrant of left female breast: Secondary | ICD-10-CM

## 2021-03-31 DIAGNOSIS — Z95828 Presence of other vascular implants and grafts: Secondary | ICD-10-CM

## 2021-03-31 LAB — CBC WITH DIFFERENTIAL (CANCER CENTER ONLY)
Abs Immature Granulocytes: 0.87 10*3/uL — ABNORMAL HIGH (ref 0.00–0.07)
Basophils Absolute: 0.1 10*3/uL (ref 0.0–0.1)
Basophils Relative: 1 %
Eosinophils Absolute: 0.2 10*3/uL (ref 0.0–0.5)
Eosinophils Relative: 1 %
HCT: 29.3 % — ABNORMAL LOW (ref 36.0–46.0)
Hemoglobin: 9.8 g/dL — ABNORMAL LOW (ref 12.0–15.0)
Immature Granulocytes: 6 %
Lymphocytes Relative: 9 %
Lymphs Abs: 1.3 10*3/uL (ref 0.7–4.0)
MCH: 32.2 pg (ref 26.0–34.0)
MCHC: 33.4 g/dL (ref 30.0–36.0)
MCV: 96.4 fL (ref 80.0–100.0)
Monocytes Absolute: 1 10*3/uL (ref 0.1–1.0)
Monocytes Relative: 6 %
Neutro Abs: 11.8 10*3/uL — ABNORMAL HIGH (ref 1.7–7.7)
Neutrophils Relative %: 77 %
Platelet Count: 202 10*3/uL (ref 150–400)
RBC: 3.04 MIL/uL — ABNORMAL LOW (ref 3.87–5.11)
RDW: 16.5 % — ABNORMAL HIGH (ref 11.5–15.5)
WBC Count: 15.3 10*3/uL — ABNORMAL HIGH (ref 4.0–10.5)
nRBC: 0 % (ref 0.0–0.2)

## 2021-03-31 LAB — CMP (CANCER CENTER ONLY)
ALT: 19 U/L (ref 0–44)
AST: 22 U/L (ref 15–41)
Albumin: 3.7 g/dL (ref 3.5–5.0)
Alkaline Phosphatase: 102 U/L (ref 38–126)
Anion gap: 14 (ref 5–15)
BUN: 23 mg/dL — ABNORMAL HIGH (ref 6–20)
CO2: 26 mmol/L (ref 22–32)
Calcium: 9.5 mg/dL (ref 8.9–10.3)
Chloride: 103 mmol/L (ref 98–111)
Creatinine: 1.27 mg/dL — ABNORMAL HIGH (ref 0.44–1.00)
GFR, Estimated: 49 mL/min — ABNORMAL LOW (ref >60)
Glucose, Bld: 157 mg/dL — ABNORMAL HIGH (ref 70–99)
Potassium: 3 mmol/L — ABNORMAL LOW (ref 3.5–5.1)
Sodium: 143 mmol/L (ref 135–145)
Total Bilirubin: 0.3 mg/dL (ref 0.3–1.2)
Total Protein: 7.1 g/dL (ref 6.5–8.1)

## 2021-03-31 LAB — TSH: TSH: 2.724 u[IU]/mL (ref 0.308–3.960)

## 2021-03-31 MED ORDER — DIPHENHYDRAMINE HCL 50 MG/ML IJ SOLN
25.0000 mg | Freq: Once | INTRAMUSCULAR | Status: AC
  Administered 2021-03-31: 25 mg via INTRAVENOUS
  Filled 2021-03-31: qty 1

## 2021-03-31 MED ORDER — SODIUM CHLORIDE 0.9 % IV SOLN: Freq: Once | INTRAVENOUS | Status: AC

## 2021-03-31 MED ORDER — SODIUM CHLORIDE 0.9% FLUSH
10.0000 mL | INTRAVENOUS | Status: DC | PRN
Start: 2021-03-31 — End: 2021-03-31
  Administered 2021-03-31: 10 mL

## 2021-03-31 MED ORDER — SODIUM CHLORIDE 0.9 % IV SOLN
75.0000 mg/m2 | Freq: Once | INTRAVENOUS | Status: AC
  Administered 2021-03-31: 156 mg via INTRAVENOUS
  Filled 2021-03-31: qty 26

## 2021-03-31 MED ORDER — SODIUM CHLORIDE 0.9% FLUSH
10.0000 mL | Freq: Once | INTRAVENOUS | Status: DC
  Administered 2021-03-31: 10 mL

## 2021-03-31 MED ORDER — PALONOSETRON HCL INJECTION 0.25 MG/5ML
0.2500 mg | Freq: Once | INTRAVENOUS | Status: AC
  Administered 2021-03-31: 0.25 mg via INTRAVENOUS
  Filled 2021-03-31: qty 5

## 2021-03-31 MED ORDER — AMLODIPINE BESYLATE 10 MG PO TABS: 10.0000 mg | ORAL_TABLET | Freq: Every day | ORAL | Status: AC

## 2021-03-31 MED ORDER — SODIUM CHLORIDE 0.9 % IV SOLN
200.0000 mg | Freq: Once | INTRAVENOUS | Status: AC
  Administered 2021-03-31: 200 mg via INTRAVENOUS
  Filled 2021-03-31: qty 8

## 2021-03-31 MED ORDER — SODIUM CHLORIDE 0.9 % IV SOLN
10.0000 mg | Freq: Once | INTRAVENOUS | Status: AC
  Administered 2021-03-31: 10 mg via INTRAVENOUS
  Filled 2021-03-31: qty 10

## 2021-03-31 MED ORDER — HEPARIN SOD (PORK) LOCK FLUSH 100 UNIT/ML IV SOLN
500.0000 [IU] | Freq: Once | INTRAVENOUS | Status: AC | PRN
  Administered 2021-03-31: 500 [IU]

## 2021-03-31 MED ORDER — SODIUM CHLORIDE 0.9 % IV SOLN
194.4000 mg | Freq: Once | INTRAVENOUS | Status: AC
  Administered 2021-03-31: 190 mg via INTRAVENOUS
  Filled 2021-03-31: qty 19

## 2021-03-31 MED ORDER — FAMOTIDINE 20 MG IN NS 100 ML IVPB
20.0000 mg | Freq: Once | INTRAVENOUS | Status: AC
  Administered 2021-03-31: 20 mg via INTRAVENOUS
  Filled 2021-03-31: qty 100

## 2021-03-31 NOTE — Assessment & Plan Note (Signed)
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 Neoadjuvant chemotherapy with Adriamycin and Cytoxan dose dense 4 followed by Taxol weekly 12 with carboplatin every 3 weeks x4(pembrolizumab will be added if she is triple negative) 2. Followed bybreast conserving surgery versusmastectomy andaxillary lymph node dissection (due to matted axillary lymph nodes) 3. Followed by adjuvant radiation therapy PET CT scan: Known breast cancer and lymphadenopathy in the axilla, supraclavicular, pectoral, internal mammary lymph nodes, no distant metastatic disease -------------------------------------------------------------------------- Current treatment:Completed 4 cycles ofneoadjuvant Adriamycin Cytoxan and Pembrolizumab, today is cycle10Taxol, carbo and keytruda(holdingcarboplatinfor platelets of 75)  Toxicities: Denies any nausea or vomiting. 1.Fatigue 2.Alopecia 3.Hypertension: Monitoring closely. 4.Chemotherapy-induced anemia: Hemoglobin stable 10.9 5.Thrombocytopenia:Platelet count   6.Leukopenia:Gets Granixevery 3 weeks 7.  Renal insufficiency: Unclear etiology suspect this is because of decreased oral intake.  We will add 1 L of normal saline to her treatment today. She had her teeth extracted recently.  This is helped significantly with the headache.  She had diarrhea as a result of antibiotics.  This is a likely cause of her renal insufficiency.  Monitoring closely for immunotherapy related adverse effects.:monitoringTSH.  Return to clinic weekly for chemotherapy.

## 2021-03-31 NOTE — Patient Instructions (Signed)
[facility]   Discharge Instructions: Thank you for choosing [facility] to provide your oncology and hematology care.   If you have a lab appointment with the [facility], please go directly to the [facility] and check in at the registration area.   Wear comfortable clothing and clothing appropriate for easy access to any Portacath or PICC line.   We strive to give you quality time with your provider. You may need to reschedule your appointment if you arrive late (15 or more minutes).  Arriving late affects you and other patients whose appointments are after yours.  Also, if you miss three or more appointments without notifying the office, you may be dismissed from the clinic at the provider's discretion.      For prescription refill requests, have your pharmacy contact our office and allow 72 hours for refills to be completed.    Today you received the following chemotherapy and/or immunotherapy agents: Paclitaxel (Taxol)/carboplatin/keytruda      To help prevent nausea and vomiting after your treatment, we encourage you to take your nausea medication as directed.  BELOW ARE SYMPTOMS THAT SHOULD BE REPORTED IMMEDIATELY: *FEVER GREATER THAN 100.4 F (38 C) OR HIGHER *CHILLS OR SWEATING *NAUSEA AND VOMITING THAT IS NOT CONTROLLED WITH YOUR NAUSEA MEDICATION *UNUSUAL SHORTNESS OF BREATH *UNUSUAL BRUISING OR BLEEDING *URINARY PROBLEMS (pain or burning when urinating, or frequent urination) *BOWEL PROBLEMS (unusual diarrhea, constipation, pain near the anus) TENDERNESS IN MOUTH AND THROAT WITH OR WITHOUT PRESENCE OF ULCERS (sore throat, sores in mouth, or a toothache) UNUSUAL RASH, SWELLING OR PAIN  UNUSUAL VAGINAL DISCHARGE OR ITCHING   Items with * indicate a potential emergency and should be followed up as soon as possible or go to the Emergency Department if any problems should occur.  Please show the CHEMOTHERAPY ALERT CARD or  IMMUNOTHERAPY ALERT CARD at check-in to the Emergency Department and triage nurse.  Should you have questions after your visit or need to cancel or reschedule your appointment, please contact [facility]  Dept: [phone]  and follow the prompts.  Office hours are 8:00 a.m. to 4:30 p.m. Monday - Friday. Please note that voicemails left after 4:00 p.m. may not be returned until the following business day.  We are closed weekends and major holidays. You have access to a nurse at all times for urgent questions. Please call the main number to the clinic Dept: [phone] and follow the prompts.   For any non-urgent questions, you may also contact your provider using MyChart. We now offer e-Visits for anyone 83 and older to request care online for non-urgent symptoms. For details visit [email or web address].   Also download the MyChart app! Go to the app store, search "MyChart", open the app, select , and log in with your MyChart username and password.  Due to Covid, a mask is required upon entering the hospital/clinic. If you do not have a mask, one will be given to you upon arrival. For doctor visits, patients may have 1 support person aged 18 or older with them. For treatment visits, patients cannot have anyone with them due to current Covid guidelines and our immunocompromised population.

## 2021-04-01 LAB — T4: T4, Total: 8.8 ug/dL (ref 4.5–12.0)

## 2021-04-02 ENCOUNTER — Encounter: Payer: Self-pay | Admitting: *Deleted

## 2021-04-02 ENCOUNTER — Telehealth: Payer: Self-pay | Admitting: *Deleted

## 2021-04-02 NOTE — Telephone Encounter (Signed)
Received appt with Dr. Donne Hazel for 10/27 arrive at 2pm. Called pt, left vm with appt date and time. Contact information provided for questions or needs.

## 2021-04-08 ENCOUNTER — Other Ambulatory Visit: Payer: Self-pay

## 2021-04-08 ENCOUNTER — Inpatient Hospital Stay: Payer: Federal, State, Local not specified - PPO

## 2021-04-08 ENCOUNTER — Other Ambulatory Visit: Payer: Self-pay | Admitting: Hematology and Oncology

## 2021-04-08 VITALS — BP 148/78 | HR 78 | Temp 98.2°F | Resp 18 | Wt 215.0 lb

## 2021-04-08 DIAGNOSIS — Z171 Estrogen receptor negative status [ER-]: Secondary | ICD-10-CM

## 2021-04-08 DIAGNOSIS — Z95828 Presence of other vascular implants and grafts: Secondary | ICD-10-CM

## 2021-04-08 DIAGNOSIS — C50412 Malignant neoplasm of upper-outer quadrant of left female breast: Secondary | ICD-10-CM | POA: Diagnosis not present

## 2021-04-08 LAB — CMP (CANCER CENTER ONLY)
ALT: 24 U/L (ref 0–44)
AST: 26 U/L (ref 15–41)
Albumin: 3.9 g/dL (ref 3.5–5.0)
Alkaline Phosphatase: 64 U/L (ref 38–126)
Anion gap: 7 (ref 5–15)
BUN: 24 mg/dL — ABNORMAL HIGH (ref 6–20)
CO2: 31 mmol/L (ref 22–32)
Calcium: 9.4 mg/dL (ref 8.9–10.3)
Chloride: 101 mmol/L (ref 98–111)
Creatinine: 1.06 mg/dL — ABNORMAL HIGH (ref 0.44–1.00)
GFR, Estimated: >60 mL/min (ref >60)
Glucose, Bld: 158 mg/dL — ABNORMAL HIGH (ref 70–99)
Potassium: 3.2 mmol/L — ABNORMAL LOW (ref 3.5–5.1)
Sodium: 139 mmol/L (ref 135–145)
Total Bilirubin: 0.6 mg/dL (ref 0.3–1.2)
Total Protein: 7.1 g/dL (ref 6.5–8.1)

## 2021-04-08 LAB — CBC WITH DIFFERENTIAL (CANCER CENTER ONLY)
Abs Immature Granulocytes: 0.03 10*3/uL (ref 0.00–0.07)
Basophils Absolute: 0 10*3/uL (ref 0.0–0.1)
Basophils Relative: 1 %
Eosinophils Absolute: 0.1 10*3/uL (ref 0.0–0.5)
Eosinophils Relative: 1 %
HCT: 28.2 % — ABNORMAL LOW (ref 36.0–46.0)
Hemoglobin: 9.4 g/dL — ABNORMAL LOW (ref 12.0–15.0)
Immature Granulocytes: 1 %
Lymphocytes Relative: 11 %
Lymphs Abs: 0.6 10*3/uL — ABNORMAL LOW (ref 0.7–4.0)
MCH: 32 pg (ref 26.0–34.0)
MCHC: 33.3 g/dL (ref 30.0–36.0)
MCV: 95.9 fL (ref 80.0–100.0)
Monocytes Absolute: 0.4 10*3/uL (ref 0.1–1.0)
Monocytes Relative: 9 %
Neutro Abs: 4 10*3/uL (ref 1.7–7.7)
Neutrophils Relative %: 77 %
Platelet Count: 168 10*3/uL (ref 150–400)
RBC: 2.94 MIL/uL — ABNORMAL LOW (ref 3.87–5.11)
RDW: 16.7 % — ABNORMAL HIGH (ref 11.5–15.5)
WBC Count: 5.1 10*3/uL (ref 4.0–10.5)
nRBC: 0 % (ref 0.0–0.2)

## 2021-04-08 LAB — TSH: TSH: 4.96 u[IU]/mL — ABNORMAL HIGH (ref 0.350–4.500)

## 2021-04-08 MED ORDER — SODIUM CHLORIDE 0.9 % IV SOLN
75.0000 mg/m2 | Freq: Once | INTRAVENOUS | Status: AC
  Administered 2021-04-08: 156 mg via INTRAVENOUS
  Filled 2021-04-08: qty 26

## 2021-04-08 MED ORDER — SODIUM CHLORIDE 0.9% FLUSH
10.0000 mL | Freq: Once | INTRAVENOUS | Status: AC
  Administered 2021-04-08: 10 mL

## 2021-04-08 MED ORDER — FAMOTIDINE 20 MG IN NS 100 ML IVPB
20.0000 mg | Freq: Once | INTRAVENOUS | Status: AC
  Administered 2021-04-08: 20 mg via INTRAVENOUS
  Filled 2021-04-08: qty 100

## 2021-04-08 MED ORDER — SODIUM CHLORIDE 0.9 % IV SOLN: Freq: Once | INTRAVENOUS | Status: AC

## 2021-04-08 MED ORDER — SODIUM CHLORIDE 0.9 % IV SOLN
10.0000 mg | Freq: Once | INTRAVENOUS | Status: AC
  Administered 2021-04-08: 10 mg via INTRAVENOUS
  Filled 2021-04-08: qty 10

## 2021-04-08 MED ORDER — DIPHENHYDRAMINE HCL 50 MG/ML IJ SOLN
25.0000 mg | Freq: Once | INTRAMUSCULAR | Status: AC
  Administered 2021-04-08: 25 mg via INTRAVENOUS
  Filled 2021-04-08: qty 1

## 2021-04-09 LAB — T4: T4, Total: 9.4 ug/dL (ref 4.5–12.0)

## 2021-04-13 MED FILL — Dexamethasone Sodium Phosphate Inj 100 MG/10ML: INTRAMUSCULAR | Qty: 1 | Status: AC

## 2021-04-13 NOTE — Progress Notes (Signed)
Patient Care Team: Riki Sheer, NP as PCP - General (Nurse Practitioner) Mauro Kaufmann, RN as Oncology Nurse Navigator Rockwell Germany, RN as Oncology Nurse Navigator Rolm Bookbinder, MD as Consulting Physician (General Surgery) Nicholas Lose, MD as Consulting Physician (Hematology and Oncology) Kyung Rudd, MD as Consulting Physician (Radiation Oncology)  DIAGNOSIS:    ICD-10-CM   1. Malignant neoplasm of upper-outer quadrant of left breast in female, estrogen receptor negative ([place])  C50.412    Z17.1       SUMMARY OF ONCOLOGIC HISTORY: Oncology History  Malignant neoplasm of upper-outer quadrant of left breast in female, estrogen receptor negative ([place])  09/30/2020 Initial Diagnosis   Swollen left breast: Mammogram detected left breast mass UOQ skin thickening with 3 abnormal lymph nodes that are matted and hard, ultrasound 2.7 cm, 2.9 cm, 3 cm and 3 lymph nodes biopsy revealed grade 2-3 IDC with DCIS ER/PR negative HER-2 IHC equivocal FISH pending, Ki-67 60%, lymph node biopsy positive   10/01/2020 Cancer Staging   Staging form: Breast, AJCC 8th Edition - Clinical stage from 10/01/2020: Stage IIIC (cT4b, cN2a, cM0, G3, ER-, PR-, HER2: Equivocal) - Signed by Nicholas Lose, MD on 10/01/2020 Stage prefix: Initial diagnosis Histologic grading system: 3 grade system   10/13/2020 Genetic Testing   Negative hereditary cancer genetic testing: no pathogenic variants detected in [facility] CustomNext-Cancer +RNAinsight Panel.  The report date is October 13, 2020.   The CustomNext-Cancer+RNAinsight panel offered by [facility] includes sequencing and rearrangement analysis for the following 47 genes:  APC, ATM, AXIN2, BARD1, BMPR1A, BRCA1, BRCA2, BRIP1, CDH1, CDK4, CDKN2A, CHEK2, DICER1, EPCAM, GREM1, HOXB13, MEN1, MLH1, MSH2, MSH3, MSH6, MUTYH, NBN, NF1, NF2, NTHL1, PALB2, PMS2, POLD1, POLE, PTEN, RAD51C, RAD51D, RECQL, RET, SDHA, SDHAF2, SDHB, SDHC, SDHD, SMAD4, SMARCA4, STK11, TP53,  TSC1, TSC2, and VHL.  RNA data is routinely analyzed for use in variant interpretation for all genes.   01/13/2021 -  Chemotherapy   Patient is on Treatment Plan : BREAST Pembrolizumab + AC q21d x 4 cycles / Pembrolizumab + Carboplatin D1 + Paclitaxel D1,8,15 q21d X 4 cycles       CHIEF COMPLIANT: cycle 12 Taxol, added carboplatin AUC 2 (Keytruda every 3 weeks)  INTERVAL HISTORY: Rita Cole is a 60 y.o. with above-mentioned history of left breast cancer currently on neoadjuvant chemotherapy with Taxol  and Keytruda. She presents to the clinic for treatment.  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  At night her feet seem to fall asleep and she has to get up and move around.  She has not stumbled she has not had any falls.  She has not dropped any objects.  ALLERGIES:  has No Known Allergies.  MEDICATIONS:  Current Outpatient Medications  Medication Sig Dispense Refill   amLODipine (NORVASC) 10 MG tablet Take 1 tablet (10 mg total) by mouth daily.     benazepril (LOTENSIN) 40 MG tablet Take 40 mg by mouth daily.     cyclobenzaprine (FLEXERIL) 10 MG tablet Take 10 mg by mouth 3 (three) times daily as needed.     hydrochlorothiazide (HYDRODIURIL) 25 MG tablet Take 1 tablet (25 mg total) by mouth daily. 30 tablet 0   ketorolac (TORADOL) 10 MG tablet Take 1 tablet (10 mg total) by mouth every 6 (six) hours as needed. 20 tablet 0   lidocaine-prilocaine (EMLA) cream Apply to affected area once 30 g 3   meloxicam (MOBIC) 15  MG tablet Take 1 tablet (15 mg total) by mouth daily. 10 tablet 0   ondansetron (ZOFRAN) 8 MG tablet Take 1 tablet (8 mg total) by mouth 2 (two) times daily as needed. Start on the third day after carboplatin and AC chemotherapy. 30 tablet 1   prochlorperazine (COMPAZINE) 10 MG tablet Take 1 tablet (10 mg total) by mouth every 6 (six) hours as needed (Nausea or vomiting). 30 tablet 1   tiZANidine (ZANAFLEX) 4 MG  tablet Take 4 mg by mouth 3 (three) times daily as needed.     Vitamin D, Ergocalciferol, (DRISDOL) 1.25 MG (50000 UNIT) CAPS capsule Take 50,000 Units by mouth once a week. Monday     No current facility-administered medications for this visit.    PHYSICAL EXAMINATION: ECOG PERFORMANCE STATUS: 1 - Symptomatic but completely ambulatory  Vitals:   04/14/21 0858  BP: 130/82  Pulse: 80  Resp: 18  Temp: 97.7 F (36.5 C)  SpO2: 98%   Filed Weights   04/14/21 0858  Weight: 212 lb 3.2 oz (96.3 kg)     LABORATORY DATA:  I have reviewed the data as listed CMP Latest Ref Rng & Units 04/14/2021 04/08/2021 03/31/2021  Glucose 70 - 99 mg/dL 120(H) 158(H) 157(H)  BUN 6 - 20 mg/dL 15 24(H) 23(H)  Creatinine 0.44 - 1.00 mg/dL 1.05(H) 1.06(H) 1.27(H)  Sodium 135 - 145 mmol/L 143 139 143  Potassium 3.5 - 5.1 mmol/L 3.2(L) 3.2(L) 3.0(L)  Chloride 98 - 111 mmol/L 103 101 103  CO2 22 - 32 mmol/L $RemoveB'28 31 26  'RizYWvVs$ Calcium 8.9 - 10.3 mg/dL 10.0 9.4 9.5  Total Protein 6.5 - 8.1 g/dL 7.7 7.1 7.1  Total Bilirubin 0.3 - 1.2 mg/dL 0.5 0.6 0.3  Alkaline Phos 38 - 126 U/L 63 64 102  AST 15 - 41 U/L $Remo'22 26 22  'avGEo$ ALT 0 - 44 U/L $Remo'21 24 19    'XFjQz$ Lab Results  Component Value Date   WBC 3.7 (L) 04/14/2021   HGB 10.1 (L) 04/14/2021   HCT 30.3 (L) 04/14/2021   MCV 97.1 04/14/2021   PLT 195 04/14/2021   NEUTROABS 2.7 04/14/2021    ASSESSMENT & PLAN:  Malignant neoplasm of upper-outer quadrant of left breast in female, estrogen receptor negative ([place]) 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 Neoadjuvant chemotherapy with Adriamycin and Cytoxan dose dense 4 followed by Taxol weekly 12 with carboplatin every 3  weeks x4 (pembrolizumab will be added if she is triple negative) 2. Followed by breast conserving surgery versus mastectomy and axillary lymph node dissection (due to matted axillary lymph nodes) 3. Followed by adjuvant radiation therapy PET CT scan: Known breast cancer and lymphadenopathy in the axilla, supraclavicular, pectoral, internal mammary lymph nodes, no distant metastatic disease -------------------------------------------------------------------------- Current treatment: Completed 4 cycles of neoadjuvant Adriamycin Cytoxan and Pembrolizumab, today is cycle 12 Taxol, carbo and keytruda (holding carboplatin for platelets of 75)   Toxicities: Denies any nausea or vomiting. 1. Fatigue 2. Alopecia 3.  Hypertension: Monitoring closely.  Amlodipine was added to her treatment. 4.  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  Thrombocytopenia: Platelet count 195 6.  Leukopenia: Much improved 7.  Renal insufficiency: Monitoring closely.   Monitoring closely  for immunotherapy related adverse effects.:  monitoring TSH. Patient has appointment with Dr. Donne Hazel after the breast MRI. We will schedule an appointment this Thursday to go over the breast MRI      No orders of the defined types were placed in this encounter.  The patient has a good understanding of the overall plan. she agrees with it. she will call with any problems that may develop before the next visit here.  Total time spent: 30 mins including face to face time and time spent for planning, charting and coordination of care  Rulon Eisenmenger, MD, MPH 04/14/2021  I, Thana Ates, am acting as scribe for Dr. Nicholas Lose.  I have reviewed the above documentation for accuracy and completeness, and I agree with the above.

## 2021-04-14 ENCOUNTER — Inpatient Hospital Stay: Payer: Federal, State, Local not specified - PPO

## 2021-04-14 ENCOUNTER — Inpatient Hospital Stay (HOSPITAL_BASED_OUTPATIENT_CLINIC_OR_DEPARTMENT_OTHER): Payer: Federal, State, Local not specified - PPO | Admitting: Hematology and Oncology

## 2021-04-14 ENCOUNTER — Other Ambulatory Visit: Payer: Self-pay

## 2021-04-14 ENCOUNTER — Encounter: Payer: Self-pay | Admitting: General Practice

## 2021-04-14 ENCOUNTER — Encounter: Payer: Self-pay | Admitting: *Deleted

## 2021-04-14 DIAGNOSIS — Z171 Estrogen receptor negative status [ER-]: Secondary | ICD-10-CM

## 2021-04-14 DIAGNOSIS — C50412 Malignant neoplasm of upper-outer quadrant of left female breast: Secondary | ICD-10-CM

## 2021-04-14 DIAGNOSIS — Z95828 Presence of other vascular implants and grafts: Secondary | ICD-10-CM

## 2021-04-14 LAB — CBC WITH DIFFERENTIAL (CANCER CENTER ONLY)
Abs Immature Granulocytes: 0.03 10*3/uL (ref 0.00–0.07)
Basophils Absolute: 0.1 10*3/uL (ref 0.0–0.1)
Basophils Relative: 1 %
Eosinophils Absolute: 0 10*3/uL (ref 0.0–0.5)
Eosinophils Relative: 1 %
HCT: 30.3 % — ABNORMAL LOW (ref 36.0–46.0)
Hemoglobin: 10.1 g/dL — ABNORMAL LOW (ref 12.0–15.0)
Immature Granulocytes: 1 %
Lymphocytes Relative: 15 %
Lymphs Abs: 0.6 10*3/uL — ABNORMAL LOW (ref 0.7–4.0)
MCH: 32.4 pg (ref 26.0–34.0)
MCHC: 33.3 g/dL (ref 30.0–36.0)
MCV: 97.1 fL (ref 80.0–100.0)
Monocytes Absolute: 0.4 10*3/uL (ref 0.1–1.0)
Monocytes Relative: 11 %
Neutro Abs: 2.7 10*3/uL (ref 1.7–7.7)
Neutrophils Relative %: 71 %
Platelet Count: 195 10*3/uL (ref 150–400)
RBC: 3.12 MIL/uL — ABNORMAL LOW (ref 3.87–5.11)
RDW: 16.6 % — ABNORMAL HIGH (ref 11.5–15.5)
WBC Count: 3.7 10*3/uL — ABNORMAL LOW (ref 4.0–10.5)
nRBC: 0 % (ref 0.0–0.2)

## 2021-04-14 LAB — CMP (CANCER CENTER ONLY)
ALT: 21 U/L (ref 0–44)
AST: 22 U/L (ref 15–41)
Albumin: 4 g/dL (ref 3.5–5.0)
Alkaline Phosphatase: 63 U/L (ref 38–126)
Anion gap: 12 (ref 5–15)
BUN: 15 mg/dL (ref 6–20)
CO2: 28 mmol/L (ref 22–32)
Calcium: 10 mg/dL (ref 8.9–10.3)
Chloride: 103 mmol/L (ref 98–111)
Creatinine: 1.05 mg/dL — ABNORMAL HIGH (ref 0.44–1.00)
GFR, Estimated: >60 mL/min (ref >60)
Glucose, Bld: 120 mg/dL — ABNORMAL HIGH (ref 70–99)
Potassium: 3.2 mmol/L — ABNORMAL LOW (ref 3.5–5.1)
Sodium: 143 mmol/L (ref 135–145)
Total Bilirubin: 0.5 mg/dL (ref 0.3–1.2)
Total Protein: 7.7 g/dL (ref 6.5–8.1)

## 2021-04-14 LAB — TSH: TSH: 3.686 u[IU]/mL (ref 0.308–3.960)

## 2021-04-14 MED ORDER — SODIUM CHLORIDE 0.9 % IV SOLN: Freq: Once | INTRAVENOUS | Status: AC

## 2021-04-14 MED ORDER — DEXAMETHASONE SODIUM PHOSPHATE 100 MG/10ML IJ SOLN
10.0000 mg | Freq: Once | INTRAMUSCULAR | Status: AC
  Administered 2021-04-14: 10 mg via INTRAVENOUS
  Filled 2021-04-14: qty 10

## 2021-04-14 MED ORDER — FAMOTIDINE 20 MG IN NS 100 ML IVPB
20.0000 mg | Freq: Once | INTRAVENOUS | Status: AC
  Administered 2021-04-14: 20 mg via INTRAVENOUS
  Filled 2021-04-14: qty 100

## 2021-04-14 MED ORDER — DIPHENHYDRAMINE HCL 50 MG/ML IJ SOLN
25.0000 mg | Freq: Once | INTRAMUSCULAR | Status: AC
  Administered 2021-04-14: 25 mg via INTRAVENOUS
  Filled 2021-04-14: qty 1

## 2021-04-14 MED ORDER — SODIUM CHLORIDE 0.9% FLUSH
10.0000 mL | Freq: Once | INTRAVENOUS | Status: AC
  Administered 2021-04-14: 10 mL

## 2021-04-14 MED ORDER — SODIUM CHLORIDE 0.9% FLUSH
10.0000 mL | INTRAVENOUS | Status: DC | PRN
  Administered 2021-04-14: 10 mL

## 2021-04-14 MED ORDER — HEPARIN SOD (PORK) LOCK FLUSH 100 UNIT/ML IV SOLN
500.0000 [IU] | Freq: Once | INTRAVENOUS | Status: AC | PRN
  Administered 2021-04-14: 500 [IU]

## 2021-04-14 MED ORDER — SODIUM CHLORIDE 0.9 % IV SOLN
75.0000 mg/m2 | Freq: Once | INTRAVENOUS | Status: AC
  Administered 2021-04-14: 156 mg via INTRAVENOUS
  Filled 2021-04-14: qty 26

## 2021-04-14 NOTE — Progress Notes (Signed)
Rita Cole Spiritual Care Note  Connected with Rita Cole on her last day of treatment. She is joyful and grateful to reach this milestone and notes that moving through the treatment process has deepened her faith. She is excited to ring the bell and is very appreciative of the whole staff's care for her and for others. Rita Cole is aware of ongoing chaplain availability and plans to reach out as needed/desired.   [place], [place], [facility] Pager [phone] Voicemail [phone]

## 2021-04-14 NOTE — Assessment & Plan Note (Signed)
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 Neoadjuvant chemotherapy with Adriamycin and Cytoxan dose dense 4 followed by Taxol weekly 12 with carboplatin every 3 weeks x4(pembrolizumab will be added if she is triple negative) 2. Followed bybreast conserving surgery versusmastectomy andaxillary lymph node dissection (due to matted axillary lymph nodes) 3. Followed by adjuvant radiation therapy PET CT scan: Known breast cancer and lymphadenopathy in the axilla, supraclavicular, pectoral, internal mammary lymph nodes, no distant metastatic disease -------------------------------------------------------------------------- Current treatment:Completed 4 cycles ofneoadjuvant Adriamycin Cytoxan and Pembrolizumab, today is cycle12Taxol, carbo and keytruda(holdingcarboplatinfor platelets of 75)  Toxicities: Denies any nausea or vomiting. 1.Fatigue 2.Alopecia 3.Hypertension: Monitoring closely.  Amlodipine was added to her treatment. 4.Chemotherapy-induced anemia: Hemoglobin 9.8 5.Thrombocytopenia:Platelet count 202 6.Leukopenia:Gets Granixevery 3 weeks 7.Renal insufficiency: Monitoring closely.  Monitoring closely for immunotherapy related adverse effects.:monitoringTSH. Patient has appointment with Dr. Donne Hazel after the breast MRI.

## 2021-04-14 NOTE — Patient Instructions (Signed)
[facility]  Discharge Instructions: Thank you for choosing [facility] to provide your oncology and hematology care.   If you have a lab appointment with the [facility], please go directly to the [facility] and check in at the registration area.   Wear comfortable clothing and clothing appropriate for easy access to any Portacath or PICC line.   We strive to give you quality time with your provider. You may need to reschedule your appointment if you arrive late (15 or more minutes).  Arriving late affects you and other patients whose appointments are after yours.  Also, if you miss three or more appointments without notifying the office, you may be dismissed from the clinic at the provider's discretion.      For prescription refill requests, have your pharmacy contact our office and allow 72 hours for refills to be completed.    Today you received the following chemotherapy and/or immunotherapy agents: Taxol   To help prevent nausea and vomiting after your treatment, we encourage you to take your nausea medication as directed.  BELOW ARE SYMPTOMS THAT SHOULD BE REPORTED IMMEDIATELY: *FEVER GREATER THAN 100.4 F (38 C) OR HIGHER *CHILLS OR SWEATING *NAUSEA AND VOMITING THAT IS NOT CONTROLLED WITH YOUR NAUSEA MEDICATION *UNUSUAL SHORTNESS OF BREATH *UNUSUAL BRUISING OR BLEEDING *URINARY PROBLEMS (pain or burning when urinating, or frequent urination) *BOWEL PROBLEMS (unusual diarrhea, constipation, pain near the anus) TENDERNESS IN MOUTH AND THROAT WITH OR WITHOUT PRESENCE OF ULCERS (sore throat, sores in mouth, or a toothache) UNUSUAL RASH, SWELLING OR PAIN  UNUSUAL VAGINAL DISCHARGE OR ITCHING   Items with * indicate a potential emergency and should be followed up as soon as possible or go to the Emergency Department if any problems should occur.  Please show the CHEMOTHERAPY ALERT CARD or IMMUNOTHERAPY ALERT CARD at check-in to the  Emergency Department and triage nurse.  Should you have questions after your visit or need to cancel or reschedule your appointment, please contact [facility]  Dept: [phone]  and follow the prompts.  Office hours are 8:00 a.m. to 4:30 p.m. Monday - Friday. Please note that voicemails left after 4:00 p.m. may not be returned until the following business day.  We are closed weekends and major holidays. You have access to a nurse at all times for urgent questions. Please call the main number to the clinic Dept: [phone] and follow the prompts.   For any non-urgent questions, you may also contact your provider using MyChart. We now offer e-Visits for anyone 74 and older to request care online for non-urgent symptoms. For details visit [email or web address].   Also download the MyChart app! Go to the app store, search "MyChart", open the app, select [facility], and log in with your MyChart username and password.  Due to Covid, a mask is required upon entering the hospital/clinic. If you do not have a mask, one will be given to you upon arrival. For doctor visits, patients may have 1 support person aged 67 or older with them. For treatment visits, patients cannot have anyone with them due to current Covid guidelines and our immunocompromised population.

## 2021-04-15 ENCOUNTER — Ambulatory Visit
Admission: RE | Admit: 2021-04-15 | Discharge: 2021-04-15 | Disposition: A | Discharge status: Home / Self Care | Payer: Federal, State, Local not specified - PPO | Source: Ambulatory Visit | Attending: Hematology and Oncology | Admitting: Hematology and Oncology

## 2021-04-15 LAB — T4: T4, Total: 9.6 ug/dL (ref 4.5–12.0)

## 2021-04-15 MED ORDER — GADOBUTROL 1 MMOL/ML IV SOLN
10.0000 mL | Freq: Once | INTRAVENOUS | Status: AC | PRN
  Administered 2021-04-15: 10 mL via INTRAVENOUS

## 2021-04-15 NOTE — Progress Notes (Signed)
Patient Care Team: Riki Sheer, NP as PCP - General (Nurse Practitioner) Mauro Kaufmann, RN as Oncology Nurse Navigator Rockwell Germany, RN as Oncology Nurse Navigator Rolm Bookbinder, MD as Consulting Physician (General Surgery) Nicholas Lose, MD as Consulting Physician (Hematology and Oncology) Kyung Rudd, MD as Consulting Physician (Radiation Oncology)  DIAGNOSIS:    ICD-10-CM   1. Malignant neoplasm of upper-outer quadrant of left breast in female, estrogen receptor negative ([place])  C50.412    Z17.1       SUMMARY OF ONCOLOGIC HISTORY: Oncology History  Malignant neoplasm of upper-outer quadrant of left breast in female, estrogen receptor negative ([place])  09/30/2020 Initial Diagnosis   Swollen left breast: Mammogram detected left breast mass UOQ skin thickening with 3 abnormal lymph nodes that are matted and hard, ultrasound 2.7 cm, 2.9 cm, 3 cm and 3 lymph nodes biopsy revealed grade 2-3 IDC with DCIS ER/PR negative HER-2 IHC equivocal FISH pending, Ki-67 60%, lymph node biopsy positive   10/01/2020 Cancer Staging   Staging form: Breast, AJCC 8th Edition - Clinical stage from 10/01/2020: Stage IIIC (cT4b, cN2a, cM0, G3, ER-, PR-, HER2: Equivocal) - Signed by Nicholas Lose, MD on 10/01/2020 Stage prefix: Initial diagnosis Histologic grading system: 3 grade system   10/13/2020 Genetic Testing   Negative hereditary cancer genetic testing: no pathogenic variants detected in [facility] CustomNext-Cancer +RNAinsight Panel.  The report date is October 13, 2020.   The CustomNext-Cancer+RNAinsight panel offered by [facility] includes sequencing and rearrangement analysis for the following 47 genes:  APC, ATM, AXIN2, BARD1, BMPR1A, BRCA1, BRCA2, BRIP1, CDH1, CDK4, CDKN2A, CHEK2, DICER1, EPCAM, GREM1, HOXB13, MEN1, MLH1, MSH2, MSH3, MSH6, MUTYH, NBN, NF1, NF2, NTHL1, PALB2, PMS2, POLD1, POLE, PTEN, RAD51C, RAD51D, RECQL, RET, SDHA, SDHAF2, SDHB, SDHC, SDHD, SMAD4, SMARCA4, STK11, TP53,  TSC1, TSC2, and VHL.  RNA data is routinely analyzed for use in variant interpretation for all genes.   01/13/2021 -  Chemotherapy   Patient is on Treatment Plan : BREAST Pembrolizumab + AC q21d x 4 cycles / Pembrolizumab + Carboplatin D1 + Paclitaxel D1,8,15 q21d X 4 cycles       CHIEF COMPLIANT: Follow-up of left breast cancer  INTERVAL HISTORY: Rita Cole is a 60 y.o. with above-mentioned history of left breast cancer having completed neoadjuvant chemotherapy with Taxol and Keytruda. She presents to the clinic today for follow-up.  She completed her neoadjuvant chemotherapy and underwent breast MRI and she is here today to discuss results of the MRI.  ALLERGIES:  has No Known Allergies.  MEDICATIONS:  Current Outpatient Medications  Medication Sig Dispense Refill   amLODipine (NORVASC) 10 MG tablet Take 1 tablet (10 mg total) by mouth daily.     benazepril (LOTENSIN) 40 MG tablet Take 40 mg by mouth daily.     cyclobenzaprine (FLEXERIL) 10 MG tablet Take 10 mg by mouth 3 (three) times daily as needed.     hydrochlorothiazide (HYDRODIURIL) 25 MG tablet Take 1 tablet (25 mg total) by mouth daily. 30 tablet 0   ketorolac (TORADOL) 10 MG tablet Take 1 tablet (10 mg total) by mouth every 6 (six) hours as needed. 20 tablet 0   lidocaine-prilocaine (EMLA) cream Apply to affected area once 30 g 3   meloxicam (MOBIC) 15 MG tablet Take 1 tablet (15 mg total) by mouth daily. 10 tablet 0   ondansetron (ZOFRAN) 8 MG tablet Take 1 tablet (8 mg total) by mouth 2 (two) times daily as needed. Start on the third day  after carboplatin and AC chemotherapy. 30 tablet 1   prochlorperazine (COMPAZINE) 10 MG tablet Take 1 tablet (10 mg total) by mouth every 6 (six) hours as needed (Nausea or vomiting). 30 tablet 1   tiZANidine (ZANAFLEX) 4 MG tablet Take 4 mg by mouth 3 (three) times daily as needed.     Vitamin D, Ergocalciferol, (DRISDOL) 1.25 MG (50000 UNIT) CAPS capsule Take 50,000 Units by mouth  once a week. Monday     No current facility-administered medications for this visit.    PHYSICAL EXAMINATION: ECOG PERFORMANCE STATUS: 1 - Symptomatic but completely ambulatory  Vitals:   04/16/21 1400  BP: 127/61  Pulse: 78  Resp: 17  Temp: 97.9 F (36.6 C)  SpO2: 99%   Filed Weights   04/16/21 1400  Weight: 213 lb 11.2 oz (96.9 kg)      LABORATORY DATA:  I have reviewed the data as listed CMP Latest Ref Rng & Units 04/14/2021 04/08/2021 03/31/2021  Glucose 70 - 99 mg/dL 120(H) 158(H) 157(H)  BUN 6 - 20 mg/dL 15 24(H) 23(H)  Creatinine 0.44 - 1.00 mg/dL 1.05(H) 1.06(H) 1.27(H)  Sodium 135 - 145 mmol/L 143 139 143  Potassium 3.5 - 5.1 mmol/L 3.2(L) 3.2(L) 3.0(L)  Chloride 98 - 111 mmol/L 103 101 103  CO2 22 - 32 mmol/L $RemoveB'28 31 26  'oopYyeiw$ Calcium 8.9 - 10.3 mg/dL 10.0 9.4 9.5  Total Protein 6.5 - 8.1 g/dL 7.7 7.1 7.1  Total Bilirubin 0.3 - 1.2 mg/dL 0.5 0.6 0.3  Alkaline Phos 38 - 126 U/L 63 64 102  AST 15 - 41 U/L $Remo'22 26 22  'EQRFH$ ALT 0 - 44 U/L $Remo'21 24 19    'XFron$ Lab Results  Component Value Date   WBC 3.7 (L) 04/14/2021   HGB 10.1 (L) 04/14/2021   HCT 30.3 (L) 04/14/2021   MCV 97.1 04/14/2021   PLT 195 04/14/2021   NEUTROABS 2.7 04/14/2021    ASSESSMENT & PLAN:  Malignant neoplasm of upper-outer quadrant of left breast in female, estrogen receptor negative ([place]) 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 Neoadjuvant chemotherapy with Adriamycin and Cytoxan dose dense 4 followed by Taxol weekly 12 with carboplatin every 3 weeks x4 (pembrolizumab will be added if she is triple negative) 2. Followed by breast conserving surgery versus mastectomy and axillary lymph node dissection  (due to matted axillary lymph nodes) 3. Followed by adjuvant radiation therapy PET CT scan: Known breast cancer and lymphadenopathy in the axilla, supraclavicular, pectoral, internal mammary lymph nodes, no distant metastatic disease -------------------------------------------------------------------------- Current treatment: Completed 4 cycles of neoadjuvant Adriamycin Cytoxan and Pembrolizumab, completed cycle 12 Taxol, carbo and [place] (holding carboplatin for platelets of 75)   Breast MRI 04/15/2021: Marked interval decrease in the size and debulking of the breast cancer now currently measuring 2.9 cm.  Near complete resolution of the non-mass enhancement throughout the breast.  Significant decrease in the bulky axillary lymph nodes as well as several of the right axillary lymph nodes.  Return to clinic after surgery to discuss the final pathology report and determine the next adjuvant therapy plan. No orders of the defined types were placed in this encounter.  The patient has a good understanding of the  overall plan. she agrees with it. she will call with any problems that may develop before the next visit here.  Total time spent: 30 mins including face to face time and time spent for planning, charting and coordination of care  Rulon Eisenmenger, MD, MPH 04/16/2021  I, Thana Ates, am acting as scribe for Dr. Nicholas Lose.  I have reviewed the above documentation for accuracy and completeness, and I agree with the above.

## 2021-04-16 ENCOUNTER — Inpatient Hospital Stay (HOSPITAL_BASED_OUTPATIENT_CLINIC_OR_DEPARTMENT_OTHER): Payer: Federal, State, Local not specified - PPO | Admitting: Hematology and Oncology

## 2021-04-16 ENCOUNTER — Encounter: Payer: Self-pay | Admitting: *Deleted

## 2021-04-16 ENCOUNTER — Other Ambulatory Visit: Payer: Self-pay

## 2021-04-16 DIAGNOSIS — C50412 Malignant neoplasm of upper-outer quadrant of left female breast: Secondary | ICD-10-CM | POA: Diagnosis not present

## 2021-04-16 DIAGNOSIS — Z171 Estrogen receptor negative status [ER-]: Secondary | ICD-10-CM | POA: Diagnosis not present

## 2021-04-16 NOTE — Assessment & Plan Note (Signed)
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 Neoadjuvant chemotherapy with Adriamycin and Cytoxan dose dense 4 followed by Taxol weekly 12 with carboplatin every 3 weeks x4(pembrolizumab will be added if she is triple negative) 2. Followed bybreast conserving surgery versusmastectomy andaxillary lymph node dissection (due to matted axillary lymph nodes) 3. Followed by adjuvant radiation therapy PET CT scan: Known breast cancer and lymphadenopathy in the axilla, supraclavicular, pectoral, internal mammary lymph nodes, no distant metastatic disease -------------------------------------------------------------------------- Current treatment:Completed 4 cycles ofneoadjuvant Adriamycin Cytoxan and Pembrolizumab, completed cycle12Taxol, carbo and keytruda(holdingcarboplatinfor platelets of 75)  Breast MRI 04/15/2021:

## 2021-04-23 ENCOUNTER — Other Ambulatory Visit: Payer: Self-pay | Admitting: General Surgery

## 2021-04-28 ENCOUNTER — Encounter: Payer: Self-pay | Admitting: *Deleted

## 2021-05-05 ENCOUNTER — Encounter: Payer: Self-pay | Admitting: *Deleted

## 2021-05-06 ENCOUNTER — Telehealth: Payer: Self-pay | Admitting: Hematology and Oncology

## 2021-05-06 NOTE — Telephone Encounter (Signed)
Sch per 11/1, pt aware.

## 2021-05-08 NOTE — Progress Notes (Signed)
Surgical Instructions    Your procedure is scheduled on Thursday, November 17th, 2022.   Report to [facility] Main Entrance "A" at 11:00 A.M., then check in with the Admitting office.  Call this number if you have problems the morning of surgery:  [phone]   If you have any questions prior to your surgery date call [phone]: Open Monday-Friday 8am-4pm    Remember:  Do not eat after midnight the night before your surgery  You may drink clear liquids until 10:00 the morning of your surgery.   Clear liquids allowed are: Water, Non-Citrus Juices (without pulp), Carbonated Beverages, Clear Tea, Black Coffee ONLY (NO MILK, CREAM OR POWDERED CREAMER of any kind), and Gatorade  Patient Instructions  The day of surgery (if you have diabetes): Drink ONE (1) 12 oz G2 given to you in your pre admission testing appointment by 10:00 the morning of surgery. Drink in one sitting. Do not sip.  This drink was given to you during your hospital  pre-op appointment visit.  Nothing else to drink after completing the  12 oz bottle of G2.         If you have questions, please contact your surgeon's office.     Take these medicines the morning of surgery with A SIP OF WATER:  amLODipine (NORVASC) metoprolol succinate (TOPROL-XL)  If needed:  cyclobenzaprine (FLEXERIL) hydroxypropyl methylcellulose / hypromellose (ISOPTO TEARS / GONIOVISC) prochlorperazine (COMPAZINE)   As of today, STOP taking any Aspirin (unless otherwise instructed by your surgeon) Aleve, Naproxen, Ibuprofen, Motrin, Advil, Mobic, Goody's, BC's, all herbal medications, fish oil, and all vitamins.   After your COVID test   You are not required to quarantine however you are required to wear a well-fitting mask when you are out and around people not in your household.  If your mask becomes wet or soiled, replace with a new one.  Wash your hands often with soap and water for 20 seconds or clean your hands with an  alcohol-based hand sanitizer that contains at least 60% alcohol.  Do not share personal items.  Notify your provider: if you are in close contact with someone who has COVID  or if you develop a fever of 100.4 or greater, sneezing, cough, sore throat, shortness of breath or body aches.    The day of surgery:          Do not wear jewelry or makeup Do not wear lotions, powders, perfumes, or deodorant. Do not shave 48 hours prior to surgery.   Do not bring valuables to the hospital. DO Not wear nail polish, gel polish, artificial nails, or any other type of covering on natural nails including finger and toenails. If patients have artificial nails, gel coating, etc. that need to be removed by a nail salon, please have this removed prior to surgery or surgery may need to be canceled/delayed if the surgeon/ anesthesia feels like the patient is unable to be adequately monitored.              [facility] is not responsible for any belongings or valuables.  Do NOT Smoke (Tobacco/Vaping)  24 hours prior to your procedure  If you use a CPAP at night, you may bring your mask for your overnight stay.   Contacts, glasses, hearing aids, dentures or partials may not be worn into surgery, please bring cases for these belongings   For patients admitted to the hospital, discharge time will be determined by your treatment team.   Patients discharged  the day of surgery will not be allowed to drive home, and someone needs to stay with them for 24 hours.  NO VISITORS WILL BE ALLOWED IN PRE-OP WHERE PATIENTS ARE PREPPED FOR SURGERY.  ONLY 1 SUPPORT PERSON MAY BE PRESENT IN THE WAITING ROOM WHILE YOU ARE IN SURGERY.  IF YOU ARE TO BE ADMITTED, ONCE YOU ARE IN YOUR ROOM YOU WILL BE ALLOWED TWO (2) VISITORS. 1 (ONE) VISITOR MAY STAY OVERNIGHT BUT MUST ARRIVE TO THE ROOM BY 8pm.  Minor children may have two parents present. Special consideration for safety and communication needs will be reviewed on a case by case  basis.  Special instructions:    Oral Hygiene is also important to reduce your risk of infection.  Remember - BRUSH YOUR TEETH THE MORNING OF SURGERY WITH YOUR REGULAR TOOTHPASTE   [facility]- Preparing For Surgery  Before surgery, you can play an important role. Because skin is not sterile, your skin needs to be as free of germs as possible. You can reduce the number of germs on your skin by washing with CHG (chlorahexidine gluconate) Soap before surgery.  CHG is an antiseptic cleaner which kills germs and bonds with the skin to continue killing germs even after washing.     Please do not use if you have an allergy to CHG or antibacterial soaps. If your skin becomes reddened/irritated stop using the CHG.  Do not shave (including legs and underarms) for at least 48 hours prior to first CHG shower. It is OK to shave your face.  Please follow these instructions carefully.     Shower the NIGHT BEFORE SURGERY and the MORNING OF SURGERY with CHG Soap.   If you chose to wash your hair, wash your hair first as usual with your normal shampoo. After you shampoo, rinse your hair and body thoroughly to remove the shampoo.  Then [employer] and genitals (private parts) with your normal soap and rinse thoroughly to remove soap.  After that Use CHG Soap as you would any other liquid soap. You can apply CHG directly to the skin and wash gently with a scrungie or a clean washcloth.   Apply the CHG Soap to your body ONLY FROM THE NECK DOWN.  Do not use on open wounds or open sores. Avoid contact with your eyes, ears, mouth and genitals (private parts). Wash Face and genitals (private parts)  with your normal soap.   Wash thoroughly, paying special attention to the area where your surgery will be performed.  Thoroughly rinse your body with warm water from the neck down.  DO NOT shower/wash with your normal soap after using and rinsing off the CHG Soap.  Pat yourself dry with a CLEAN TOWEL.  Wear CLEAN  PAJAMAS to bed the night before surgery  Place CLEAN SHEETS on your bed the night before your surgery  DO NOT SLEEP WITH PETS.   Day of Surgery:  Take a shower with CHG soap. Wear Clean/Comfortable clothing the morning of surgery Do not apply any deodorants/lotions.   Remember to brush your teeth WITH YOUR REGULAR TOOTHPASTE.   Please read over the following fact sheets that you were given.

## 2021-05-11 ENCOUNTER — Encounter (HOSPITAL_COMMUNITY)
Admission: RE | Admit: 2021-05-11 | Discharge: 2021-05-11 | Disposition: A | Discharge status: Home / Self Care | Payer: Federal, State, Local not specified - PPO | Source: Ambulatory Visit | Attending: General Surgery | Admitting: General Surgery

## 2021-05-11 ENCOUNTER — Encounter (HOSPITAL_COMMUNITY): Payer: Self-pay

## 2021-05-11 ENCOUNTER — Other Ambulatory Visit: Payer: Self-pay

## 2021-05-11 VITALS — BP 144/75 | HR 77 | Temp 98.4°F | Resp 18 | Ht 65.0 in | Wt 210.7 lb

## 2021-05-11 DIAGNOSIS — R7303 Prediabetes: Secondary | ICD-10-CM | POA: Diagnosis not present

## 2021-05-11 DIAGNOSIS — Z20822 Contact with and (suspected) exposure to covid-19: Secondary | ICD-10-CM | POA: Insufficient documentation

## 2021-05-11 DIAGNOSIS — Z01818 Encounter for other preprocedural examination: Secondary | ICD-10-CM | POA: Diagnosis not present

## 2021-05-11 DIAGNOSIS — C50412 Malignant neoplasm of upper-outer quadrant of left female breast: Secondary | ICD-10-CM

## 2021-05-11 LAB — BASIC METABOLIC PANEL
Anion gap: 12 (ref 5–15)
BUN: 20 mg/dL (ref 6–20)
CO2: 28 mmol/L (ref 22–32)
Calcium: 9.4 mg/dL (ref 8.9–10.3)
Chloride: 101 mmol/L (ref 98–111)
Creatinine, Ser: 1.1 mg/dL — ABNORMAL HIGH (ref 0.44–1.00)
GFR, Estimated: 58 mL/min — ABNORMAL LOW (ref >60)
Glucose, Bld: 115 mg/dL — ABNORMAL HIGH (ref 70–99)
Potassium: 3.3 mmol/L — ABNORMAL LOW (ref 3.5–5.1)
Sodium: 141 mmol/L (ref 135–145)

## 2021-05-11 LAB — CBC
HCT: 34.5 % — ABNORMAL LOW (ref 36.0–46.0)
Hemoglobin: 11.2 g/dL — ABNORMAL LOW (ref 12.0–15.0)
MCH: 32.2 pg (ref 26.0–34.0)
MCHC: 32.5 g/dL (ref 30.0–36.0)
MCV: 99.1 fL (ref 80.0–100.0)
Platelets: 255 10*3/uL (ref 150–400)
RBC: 3.48 MIL/uL — ABNORMAL LOW (ref 3.87–5.11)
RDW: 16.9 % — ABNORMAL HIGH (ref 11.5–15.5)
WBC: 10 10*3/uL (ref 4.0–10.5)
nRBC: 0 % (ref 0.0–0.2)

## 2021-05-11 LAB — HEMOGLOBIN A1C
Hgb A1c MFr Bld: 5.8 % — ABNORMAL HIGH (ref 4.8–5.6)
Mean Plasma Glucose: 119.76 mg/dL

## 2021-05-11 LAB — SARS CORONAVIRUS 2 (TAT 6-24 HRS): SARS Coronavirus 2: NEGATIVE

## 2021-05-11 LAB — GLUCOSE, CAPILLARY: Glucose-Capillary: 116 mg/dL — ABNORMAL HIGH (ref 70–99)

## 2021-05-11 NOTE — Progress Notes (Addendum)
PCP - Andy Gauss, NP Cardiologist - denies Oncologist - Nicholas Lose, MD  PPM/ICD - denies Device Orders - n/a Rep Notified - n/a  Chest x-ray - n/a EKG - 05/11/2021 Stress Test - denies ECHO - 01/16/2021 Cardiac Cath - denies  Sleep Study - denies CPAP - n/a  Pre-diabetes - CBG today - 116  A1C today in PAT  Blood Thinner Instructions: n/a  Aspirin Instructions: Patient was instructed: As of today, STOP taking any Aspirin (unless otherwise instructed by your surgeon) Aleve, Naproxen, Ibuprofen, Motrin, Advil, Mobic, Goody's, BC's, all herbal medications, fish oil, and all vitamins.  ERAS Protcol - yes PRE-SURGERY G2- yes  COVID TEST- done in PAT on 05/11/2021   Anesthesia review: yes. Abnormal EKG in PAT; Patient finished chemotherapy on 04/14/2021  Patient denies shortness of breath, fever, cough and chest pain at PAT appointment   All instructions explained to the patient, with a verbal understanding of the material. Patient agrees to go over the instructions while at home for a better understanding. Patient also instructed to self quarantine after being tested for COVID-19. The opportunity to ask questions was provided.

## 2021-05-14 ENCOUNTER — Other Ambulatory Visit: Payer: Self-pay

## 2021-05-14 ENCOUNTER — Ambulatory Visit (HOSPITAL_COMMUNITY): Payer: Federal, State, Local not specified - PPO | Admitting: Certified Registered Nurse Anesthetist

## 2021-05-14 ENCOUNTER — Encounter (HOSPITAL_COMMUNITY): Payer: Self-pay | Admitting: General Surgery

## 2021-05-14 ENCOUNTER — Ambulatory Visit (HOSPITAL_COMMUNITY): Payer: Federal, State, Local not specified - PPO | Admitting: Physician Assistant

## 2021-05-14 ENCOUNTER — Encounter (HOSPITAL_COMMUNITY)
Admission: RE | Disposition: A | Discharge status: Home / Self Care | Payer: Self-pay | Source: Ambulatory Visit | Attending: General Surgery

## 2021-05-14 ENCOUNTER — Observation Stay (HOSPITAL_COMMUNITY)
Admission: RE | Admit: 2021-05-14 | Discharge: 2021-05-15 | Disposition: A | Discharge status: Home / Self Care | Payer: Federal, State, Local not specified - PPO | Source: Ambulatory Visit | Attending: General Surgery | Admitting: General Surgery

## 2021-05-14 DIAGNOSIS — Z171 Estrogen receptor negative status [ER-]: Secondary | ICD-10-CM | POA: Insufficient documentation

## 2021-05-14 DIAGNOSIS — C50412 Malignant neoplasm of upper-outer quadrant of left female breast: Principal | ICD-10-CM | POA: Insufficient documentation

## 2021-05-14 DIAGNOSIS — Z9012 Acquired absence of left breast and nipple: Secondary | ICD-10-CM

## 2021-05-14 DIAGNOSIS — Z801 Family history of malignant neoplasm of trachea, bronchus and lung: Secondary | ICD-10-CM | POA: Insufficient documentation

## 2021-05-14 DIAGNOSIS — Z9221 Personal history of antineoplastic chemotherapy: Secondary | ICD-10-CM | POA: Insufficient documentation

## 2021-05-14 HISTORY — PX: MODIFIED MASTECTOMY: SHX5268

## 2021-05-14 LAB — GLUCOSE, CAPILLARY: Glucose-Capillary: 101 mg/dL — ABNORMAL HIGH (ref 70–99)

## 2021-05-14 SURGERY — MODIFIED MASTECTOMY
Anesthesia: General | Site: Breast | Laterality: Left

## 2021-05-14 MED ORDER — FENTANYL CITRATE (PF) 100 MCG/2ML IJ SOLN: 50.0000 ug | Freq: Once | INTRAMUSCULAR | Status: AC

## 2021-05-14 MED ORDER — ENSURE PRE-SURGERY PO LIQD: 296.0000 mL | Freq: Once | ORAL | Status: DC

## 2021-05-14 MED ORDER — DEXAMETHASONE SODIUM PHOSPHATE 4 MG/ML IJ SOLN
INTRAMUSCULAR | Status: DC | PRN
  Administered 2021-05-14: 8 mg via INTRAVENOUS

## 2021-05-14 MED ORDER — FENTANYL CITRATE (PF) 100 MCG/2ML IJ SOLN
INTRAMUSCULAR | Status: AC
  Administered 2021-05-14: 13:00:00 50 ug via INTRAVENOUS
  Filled 2021-05-14: qty 2

## 2021-05-14 MED ORDER — KETOROLAC TROMETHAMINE 30 MG/ML IJ SOLN
INTRAMUSCULAR | Status: AC
  Filled 2021-05-14: qty 1

## 2021-05-14 MED ORDER — LACTATED RINGERS IV SOLN: INTRAVENOUS | Status: DC

## 2021-05-14 MED ORDER — OXYCODONE HCL 5 MG PO TABS
5.0000 mg | ORAL_TABLET | ORAL | Status: DC | PRN
  Administered 2021-05-14: 16:00:00 10 mg via ORAL

## 2021-05-14 MED ORDER — EPHEDRINE SULFATE 50 MG/ML IJ SOLN
INTRAMUSCULAR | Status: DC | PRN
  Administered 2021-05-14: 15 mg via INTRAVENOUS

## 2021-05-14 MED ORDER — ACETAMINOPHEN 500 MG PO TABS
1000.0000 mg | ORAL_TABLET | ORAL | Status: AC
  Administered 2021-05-14: 12:00:00 1000 mg via ORAL
  Filled 2021-05-14: qty 2

## 2021-05-14 MED ORDER — METOPROLOL SUCCINATE ER 25 MG PO TB24
50.0000 mg | ORAL_TABLET | Freq: Every day | ORAL | Status: DC
  Administered 2021-05-15: 50 mg via ORAL
  Filled 2021-05-14: qty 2

## 2021-05-14 MED ORDER — MORPHINE SULFATE (PF) 2 MG/ML IV SOLN: 2.0000 mg | INTRAVENOUS | Status: DC | PRN

## 2021-05-14 MED ORDER — DEXAMETHASONE SODIUM PHOSPHATE 10 MG/ML IJ SOLN
INTRAMUSCULAR | Status: AC
  Filled 2021-05-14: qty 2

## 2021-05-14 MED ORDER — TRIAMTERENE-HCTZ 37.5-25 MG PO TABS
1.0000 | ORAL_TABLET | Freq: Every day | ORAL | Status: DC
  Administered 2021-05-14 – 2021-05-15 (×2): 1 via ORAL
  Filled 2021-05-14 (×2): qty 1

## 2021-05-14 MED ORDER — CHLORHEXIDINE GLUCONATE CLOTH 2 % EX PADS: 6.0000 | MEDICATED_PAD | Freq: Once | CUTANEOUS | Status: DC

## 2021-05-14 MED ORDER — AMLODIPINE BESYLATE 10 MG PO TABS
10.0000 mg | ORAL_TABLET | Freq: Every day | ORAL | Status: DC
  Administered 2021-05-15: 10 mg via ORAL
  Filled 2021-05-14: qty 1

## 2021-05-14 MED ORDER — SIMETHICONE 80 MG PO CHEW: 40.0000 mg | CHEWABLE_TABLET | Freq: Four times a day (QID) | ORAL | Status: DC | PRN

## 2021-05-14 MED ORDER — OXYCODONE HCL 5 MG PO TABS
ORAL_TABLET | ORAL | Status: AC
  Filled 2021-05-14: qty 2

## 2021-05-14 MED ORDER — LIDOCAINE HCL (CARDIAC) PF 100 MG/5ML IV SOSY
PREFILLED_SYRINGE | INTRAVENOUS | Status: DC | PRN
  Administered 2021-05-14: 100 mg via INTRAVENOUS

## 2021-05-14 MED ORDER — MIDAZOLAM HCL 2 MG/2ML IJ SOLN
INTRAMUSCULAR | Status: AC
  Administered 2021-05-14: 13:00:00 2 mg via INTRAVENOUS
  Filled 2021-05-14: qty 2

## 2021-05-14 MED ORDER — ONDANSETRON HCL 4 MG/2ML IJ SOLN
INTRAMUSCULAR | Status: AC
  Filled 2021-05-14: qty 2

## 2021-05-14 MED ORDER — OXYCODONE HCL 5 MG PO TABS: 5.0000 mg | ORAL_TABLET | Freq: Once | ORAL | Status: DC | PRN

## 2021-05-14 MED ORDER — ONDANSETRON HCL 4 MG/2ML IJ SOLN
4.0000 mg | Freq: Four times a day (QID) | INTRAMUSCULAR | Status: DC | PRN
  Filled 2021-05-14: qty 2

## 2021-05-14 MED ORDER — 0.9 % SODIUM CHLORIDE (POUR BTL) OPTIME
TOPICAL | Status: DC | PRN
  Administered 2021-05-14: 14:00:00 1000 mL

## 2021-05-14 MED ORDER — ONDANSETRON HCL 4 MG/2ML IJ SOLN
INTRAMUSCULAR | Status: DC | PRN
  Administered 2021-05-14: 4 mg via INTRAVENOUS

## 2021-05-14 MED ORDER — KETOROLAC TROMETHAMINE 15 MG/ML IJ SOLN: 15.0000 mg | Freq: Four times a day (QID) | INTRAMUSCULAR | Status: DC | PRN

## 2021-05-14 MED ORDER — CEFAZOLIN SODIUM-DEXTROSE 2-4 GM/100ML-% IV SOLN
2.0000 g | INTRAVENOUS | Status: AC
  Administered 2021-05-14: 13:00:00 2 g via INTRAVENOUS
  Filled 2021-05-14: qty 100

## 2021-05-14 MED ORDER — EPHEDRINE 5 MG/ML INJ
INTRAVENOUS | Status: AC
  Filled 2021-05-14: qty 5

## 2021-05-14 MED ORDER — FENTANYL CITRATE (PF) 250 MCG/5ML IJ SOLN
INTRAMUSCULAR | Status: AC
  Filled 2021-05-14: qty 5

## 2021-05-14 MED ORDER — CYCLOBENZAPRINE HCL 10 MG PO TABS: 10.0000 mg | ORAL_TABLET | Freq: Three times a day (TID) | ORAL | Status: DC | PRN

## 2021-05-14 MED ORDER — OXYCODONE HCL 5 MG/5ML PO SOLN: 5.0000 mg | Freq: Once | ORAL | Status: DC | PRN

## 2021-05-14 MED ORDER — LOSARTAN POTASSIUM 50 MG PO TABS
100.0000 mg | ORAL_TABLET | Freq: Every day | ORAL | Status: DC
  Administered 2021-05-14 – 2021-05-15 (×2): 100 mg via ORAL
  Filled 2021-05-14 (×2): qty 2

## 2021-05-14 MED ORDER — PROPOFOL 500 MG/50ML IV EMUL
INTRAVENOUS | Status: DC | PRN
  Administered 2021-05-14: 50 ug/kg/min via INTRAVENOUS

## 2021-05-14 MED ORDER — MIDAZOLAM HCL 2 MG/2ML IJ SOLN
INTRAMUSCULAR | Status: AC
  Filled 2021-05-14: qty 2

## 2021-05-14 MED ORDER — HYDROCHLOROTHIAZIDE 25 MG PO TABS
25.0000 mg | ORAL_TABLET | Freq: Every day | ORAL | Status: DC
Start: 2021-05-14 — End: 2021-05-14

## 2021-05-14 MED ORDER — MIDAZOLAM HCL 2 MG/2ML IJ SOLN: 2.0000 mg | Freq: Once | INTRAMUSCULAR | Status: AC

## 2021-05-14 MED ORDER — ORAL CARE MOUTH RINSE: 15.0000 mL | Freq: Once | OROMUCOSAL | Status: AC

## 2021-05-14 MED ORDER — PROPOFOL 10 MG/ML IV BOLUS
INTRAVENOUS | Status: DC | PRN
  Administered 2021-05-14: 180 mg via INTRAVENOUS

## 2021-05-14 MED ORDER — ONDANSETRON HCL 4 MG/2ML IJ SOLN: 4.0000 mg | Freq: Once | INTRAMUSCULAR | Status: DC | PRN

## 2021-05-14 MED ORDER — ACETAMINOPHEN 500 MG PO TABS
1000.0000 mg | ORAL_TABLET | Freq: Four times a day (QID) | ORAL | Status: DC
  Administered 2021-05-14 – 2021-05-15 (×3): 1000 mg via ORAL
  Filled 2021-05-14 (×3): qty 2

## 2021-05-14 MED ORDER — PHENYLEPHRINE HCL-NACL 20-0.9 MG/250ML-% IV SOLN
INTRAVENOUS | Status: DC | PRN
  Administered 2021-05-14: 50 ug/min via INTRAVENOUS

## 2021-05-14 MED ORDER — ROPIVACAINE HCL 5 MG/ML IJ SOLN
INTRAMUSCULAR | Status: DC | PRN
  Administered 2021-05-14: 30 mL

## 2021-05-14 MED ORDER — SODIUM CHLORIDE 0.9 % IV SOLN: INTRAVENOUS | Status: DC

## 2021-05-14 MED ORDER — KETOROLAC TROMETHAMINE 30 MG/ML IJ SOLN
30.0000 mg | Freq: Once | INTRAMUSCULAR | Status: AC | PRN
  Administered 2021-05-14: 16:00:00 30 mg via INTRAVENOUS

## 2021-05-14 MED ORDER — ONDANSETRON 4 MG PO TBDP: 4.0000 mg | ORAL_TABLET | Freq: Four times a day (QID) | ORAL | Status: DC | PRN

## 2021-05-14 MED ORDER — HYDROMORPHONE HCL 1 MG/ML IJ SOLN
0.2500 mg | INTRAMUSCULAR | Status: DC | PRN
  Administered 2021-05-14 (×2): 0.5 mg via INTRAVENOUS

## 2021-05-14 MED ORDER — HYDROMORPHONE HCL 1 MG/ML IJ SOLN
INTRAMUSCULAR | Status: AC
  Filled 2021-05-14: qty 1

## 2021-05-14 MED ORDER — ROCURONIUM BROMIDE 10 MG/ML (PF) SYRINGE
PREFILLED_SYRINGE | INTRAVENOUS | Status: AC
  Filled 2021-05-14: qty 10

## 2021-05-14 MED ORDER — ONDANSETRON HCL 4 MG/2ML IJ SOLN
INTRAMUSCULAR | Status: AC
  Filled 2021-05-14: qty 8

## 2021-05-14 MED ORDER — CHLORHEXIDINE GLUCONATE 0.12 % MT SOLN
15.0000 mL | Freq: Once | OROMUCOSAL | Status: AC
  Administered 2021-05-14: 12:00:00 15 mL via OROMUCOSAL
  Filled 2021-05-14: qty 15

## 2021-05-14 MED ORDER — FENTANYL CITRATE (PF) 100 MCG/2ML IJ SOLN
INTRAMUSCULAR | Status: DC | PRN
  Administered 2021-05-14: 50 ug via INTRAVENOUS
  Administered 2021-05-14: 100 ug via INTRAVENOUS
  Administered 2021-05-14 (×2): 50 ug via INTRAVENOUS

## 2021-05-14 MED ORDER — LIDOCAINE 2% (20 MG/ML) 5 ML SYRINGE
INTRAMUSCULAR | Status: AC
  Filled 2021-05-14: qty 15

## 2021-05-14 SURGICAL SUPPLY — 48 items
ADH SKN CLS APL DERMABOND .7 (GAUZE/BANDAGES/DRESSINGS) ×1
APL PRP STRL LF DISP 70% ISPRP (MISCELLANEOUS) ×1
APPLIER CLIP 9.375 MED OPEN (MISCELLANEOUS) ×6
APR CLP MED 9.3 20 MLT OPN (MISCELLANEOUS) ×3
BAG COUNTER SPONGE SURGICOUNT (BAG) ×2 IMPLANT
BAG SPNG CNTER NS LX DISP (BAG) ×1
BINDER BREAST XLRG (GAUZE/BANDAGES/DRESSINGS) ×1 IMPLANT
BINDER BREAST XXLRG (GAUZE/BANDAGES/DRESSINGS) ×1 IMPLANT
CANISTER SUCT 3000ML PPV (MISCELLANEOUS) ×2 IMPLANT
CHLORAPREP W/TINT 26 (MISCELLANEOUS) ×2 IMPLANT
CLIP APPLIE 9.375 MED OPEN (MISCELLANEOUS) ×1 IMPLANT
COVER SURGICAL LIGHT HANDLE (MISCELLANEOUS) ×2 IMPLANT
DERMABOND ADVANCED (GAUZE/BANDAGES/DRESSINGS) ×1
DERMABOND ADVANCED .7 DNX12 (GAUZE/BANDAGES/DRESSINGS) ×1 IMPLANT
DRAIN CHANNEL 19F RND (DRAIN) ×3 IMPLANT
DRAPE TOP ARMCOVERS (MISCELLANEOUS) ×2 IMPLANT
DRAPE U-SHAPE 76X120 STRL (DRAPES) ×2 IMPLANT
DRSG PAD ABDOMINAL 8X10 ST (GAUZE/BANDAGES/DRESSINGS) ×1 IMPLANT
ELECT REM PT RETURN 9FT ADLT (ELECTROSURGICAL) ×2
ELECTRODE REM PT RTRN 9FT ADLT (ELECTROSURGICAL) ×1 IMPLANT
EVACUATOR SILICONE 100CC (DRAIN) ×3 IMPLANT
GAUZE SPONGE 4X4 12PLY STRL (GAUZE/BANDAGES/DRESSINGS) ×2 IMPLANT
GLOVE SURG ENC MOIS LTX SZ7 (GLOVE) ×2 IMPLANT
GLOVE SURG UNDER POLY LF SZ7.5 (GLOVE) ×2 IMPLANT
GOWN STRL REUS W/ TWL LRG LVL3 (GOWN DISPOSABLE) ×3 IMPLANT
GOWN STRL REUS W/TWL LRG LVL3 (GOWN DISPOSABLE) ×6
HEMOSTAT ARISTA ABSORB 3G PWDR (HEMOSTASIS) ×2 IMPLANT
KIT BASIN OR (CUSTOM PROCEDURE TRAY) ×2 IMPLANT
KIT TURNOVER KIT B (KITS) ×2 IMPLANT
MARKER SKIN DUAL TIP RULER LAB (MISCELLANEOUS) ×2 IMPLANT
NS IRRIG 1000ML POUR BTL (IV SOLUTION) ×2 IMPLANT
PACK GENERAL/GYN (CUSTOM PROCEDURE TRAY) ×2 IMPLANT
PAD ARMBOARD 7.5X6 YLW CONV (MISCELLANEOUS) ×4 IMPLANT
PENCIL SMOKE EVACUATOR (MISCELLANEOUS) ×2 IMPLANT
SPECIMEN JAR X LARGE (MISCELLANEOUS) ×1 IMPLANT
SPONGE T-LAP 18X18 ~~LOC~~+RFID (SPONGE) ×3 IMPLANT
STAPLER VISISTAT 35W (STAPLE) ×1 IMPLANT
SUT ETHILON 2 0 FS 18 (SUTURE) ×3 IMPLANT
SUT MON AB 4-0 PC3 18 (SUTURE) ×2 IMPLANT
SUT VIC AB 2-0 SH 18 (SUTURE) ×2 IMPLANT
SUT VIC AB 3-0 54X BRD REEL (SUTURE) ×1 IMPLANT
SUT VIC AB 3-0 BRD 54 (SUTURE) ×2
SUT VIC AB 3-0 SH 18 (SUTURE) ×2 IMPLANT
SUT VIC AB 3-0 SH 27 (SUTURE) ×2
SUT VIC AB 3-0 SH 27XBRD (SUTURE) ×1 IMPLANT
SUT VIC AB 3-0 SH 8-18 (SUTURE) ×2 IMPLANT
TOWEL GREEN STERILE (TOWEL DISPOSABLE) ×2 IMPLANT
TOWEL GREEN STERILE FF (TOWEL DISPOSABLE) ×2 IMPLANT

## 2021-05-14 NOTE — H&P (Signed)
60 y.o. female who is seen today for follow-up for breast cancer after chemotherapy. She was first seen in April 2022 with a new left breast cancer. She did not really notice a difference her self but had a global asymmetry and distortion on her mammogram. There initially were 3 abnormal nodes on ultrasound. The area measured 8 cm at least extending from the nipple posteriorly. She had a node biopsy that was positive as well. 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 She had an MRI that showed a 5.1 cm mass in the upper outer quadrant but non-mass enhancement in all 4 quadrants and at least 4 abnormal lymph lymph nodes. She has now undergone chemotherapy. She has done well. She finished this on 18 October. Repeat MRI shows decrease in the size of right axillary lymph nodes. She had just received her COVID and flu vaccines prior to this and thinks that is the source of that. There is no MRI malignancy in the right breast. The left axillary nodes are basically normal. The left breast shows a 2.9 cm residual mass with near complete resolution of the additional non-mass enhancement. She notes no abnormality to the breast now either. She comes in with her daughter today to discuss surgery.  Review of Systems: A complete review of systems was obtained from the patient. I have reviewed this information and discussed as appropriate with the patient. See HPI as well for other ROS.  Review of Systems  All other systems reviewed and are negative.   Medical History: History reviewed. No pertinent past medical history.  Patient Active Problem List  Diagnosis   Family history of lung cancer   Genetic testing   Malignant neoplasm of upper-outer quadrant of left breast in female, estrogen receptor negative (CMS-HCC)   Port-A-Cath in place   Past Surgical History:  Procedure Laterality Date   BUNIONECTOMY 2004   [place] 2007    No Known  Allergies  Current Outpatient Medications on File Prior to Visit  Medication Sig Dispense Refill   amLODIPine (NORVASC) 10 MG tablet Take 1 tablet by mouth once daily   losartan (COZAAR) 50 MG tablet Take by mouth   metoprolol succinate (TOPROL-XL) 50 MG XL tablet Take 50 mg by mouth once daily   triamterene-hydrochlorothiazide (MAXZIDE-25) 37.5-25 mg tablet Take 1 tablet by mouth once daily    History reviewed. No pertinent family history.   Social History   Tobacco Use  Smoking Status Never Smoker  Smokeless Tobacco Never Used    Social History   Socioeconomic History   Marital status: Unknown  Tobacco Use   Smoking status: Never Smoker   Smokeless tobacco: Never Used  Scientific laboratory technician Use: Never used  Substance and Sexual Activity   Alcohol use: Not Currently   Drug use: Never   Objective:   Physical Exam Constitutional:  Appearance: Normal appearance.  Chest:  Breasts:  Right: No inverted nipple, mass or nipple discharge.  Left: No inverted nipple, mass or nipple discharge.  Lymphadenopathy:  Upper Body:  Right upper body: No supraclavicular or axillary adenopathy.  Left upper body: No supraclavicular or axillary adenopathy.  Neurological:  Mental Status: She is alert.     Assessment and Plan:   Malignant neoplasm of upper-outer quadrant of left breast in female, estrogen receptor negative (CMS-HCC)  Left MRM  I do not think there is any other surgical option outside of a left modified radical mastectomy. We  discussed the indications for an axillary lymph node dissection given her bulky disease preoperatively. We discussed drain as well as the risk of lymphedema and neuropathic pain postoperatively as well. She does not desire to undergo any reconstruction after seeing plastic surgery. I discussed a mastectomy through an anchor type incision. We discussed an overnight hospital stay as well as risks and recovery.  She was very interested in a bilateral  mastectomy. I think her right axillary lymph nodes are all related to her vaccination and have returned to normal I do not think these are related to her breast cancer. I do not think it is indicated really to do a bilateral mastectomy unless she really wanted to pursue that. After a conversation about the surgery as well as what the expectations are and in terms of survival from her cancer she is elected to just proceed with the left side which I think is appropriate.

## 2021-05-14 NOTE — Transfer of Care (Signed)
Immediate Anesthesia Transfer of Care Note  Patient: Rita Cole  Procedure(s) Performed: LEFT MODIFIED RADICAL MASTECTOMY (Left: Breast)  Patient Location: PACU  Anesthesia Type:GA combined with regional for post-op pain  Level of Consciousness: awake, alert  and oriented  Airway & Oxygen Therapy: Patient Spontanous Breathing  Post-op Assessment: Report given to RN and Post -op Vital signs reviewed and stable  Post vital signs: Reviewed and stable  Last Vitals:  Vitals Value Taken Time  BP 108/87 05/14/21 1531  Temp    Pulse 73 05/14/21 1532  Resp 14 05/14/21 1532  SpO2 99 % 05/14/21 1532  Vitals shown include unvalidated device data.  Last Pain:  Vitals:   05/14/21 1300  TempSrc:   PainSc: 0-No pain         Complications: No notable events documented.

## 2021-05-14 NOTE — Interval H&P Note (Signed)
History and Physical Interval Note:  05/14/2021 12:41 PM  Rita Cole  has presented today for surgery, with the diagnosis of LEFT BREAST CANCER.  The various methods of treatment have been discussed with the patient and family. After consideration of risks, benefits and other options for treatment, the patient has consented to  Procedure(s): LEFT MODIFIED RADICAL MASTECTOMY (Left) as a surgical intervention.  The patient's history has been reviewed, patient examined, no change in status, stable for surgery.  I have reviewed the patient's chart and labs.  Questions were answered to the patient's satisfaction.     Rolm Bookbinder

## 2021-05-14 NOTE — Anesthesia Postprocedure Evaluation (Signed)
Anesthesia Post Note  Patient: Rita Cole  Procedure(s) Performed: LEFT MODIFIED RADICAL MASTECTOMY (Left: Breast)     Patient location during evaluation: PACU Anesthesia Type: General Level of consciousness: awake and alert Pain management: pain level controlled Vital Signs Assessment: post-procedure vital signs reviewed and stable Respiratory status: spontaneous breathing, nonlabored ventilation, respiratory function stable and patient connected to nasal cannula oxygen Cardiovascular status: blood pressure returned to baseline and stable Postop Assessment: no apparent nausea or vomiting Anesthetic complications: no   No notable events documented.  Last Vitals:  Vitals:   05/14/21 1545 05/14/21 1600  BP: 126/73 (!) 120/59  Pulse:    Resp: 16 17  Temp:    SpO2:  98%    Last Pain:  Vitals:   05/14/21 1600  TempSrc:   PainSc: Asleep                 Demitra Danley S

## 2021-05-14 NOTE — Anesthesia Procedure Notes (Signed)
Anesthesia Procedure Image    

## 2021-05-14 NOTE — Op Note (Signed)
Preoperative diagnosis: Left breast cancer status post primary chemotherapy Postoperative diagnosis: Same as above Procedure: Left modified radical mastectomy Surgeon: Dr. Serita Grammes Assistant: Ewell Poe, RNFA Anesthesia: General with a pectoral block Estimated blood loss: 50 cc Specimens: Left breast and axillary contents marked short superior, long marks axillary contents Complications: None Drains: 19 French Blake drain Special count was correct at completion Decision to recovery stable condition  Indications: This a 60 year old female seen initially in April with a new left breast cancer.  She had a global asymmetry and distortion on her mammogram.  There were initially a at least 3 abnormal nodes on her ultrasound.  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  She also had a node biopsy that was positive as well.  She had an MRI that showed a 5 cm mass in the upper outer quadrant but non-mass enhancement in all 4 quadrants and at least 4 abnormal nodes.  She is undergone primary chemotherapy repeat MRI shows decrease in the size of a right axillary nodes and there is left breast shows a 2.9 cm residual mass with near completion of the additional nonenhanced mass enhancement.  We discussed all of her options and I think with her prior presentation there was only really one option with a modified radical mastectomy.  Procedure: After informed consent was obtained the patient first underwent a pectoral block.  She had SCDs in place.  She was given antibiotics.  She was placed under general anesthesia without complication.  She was prepped and draped in the standard sterile surgical fashion.  A surgical timeout was then performed.  I made a large reduction pattern incision to encompass the nipple and the areola and a lot of what still appeared to be abnormal skin.  I then created flaps to the  sternum, clavicle, below the inframammary fold to obliterated and laterally.  I then remove the breast and the fascia from the pectoralis muscle rolling this laterally.  I then entered in the axilla.  The axilla was very scarred in.  I was able to identify the axillary vein.  There were multiple palpable nodes that were adherent.  I was able to dissect these free from the axillary vein identifying the thoracodorsal bundle and preserving this.  The long thoracic nerve was seen.  I then was able to sweep all of the axillary tissue caudad from the vein and remove it.  There were numerous clips as there was a number of small veins as well as lymphatics where the lymph nodes were adherent to the axillary vein.  Once I had done this I then obtained hemostasis.  I did place a 74 [place] Blake drain.  I began closing with 3-0 Vicryl the lateral portion had some excess tissue so I did a hockey-stick extension and removed some of this extra tissue.  I then closed this down to the chest wall with 2-0 Vicryl suture.  The remainder the incision was closed with 3-0 Vicryl for Monocryl.  3-0 nylon was placed at the T-junction.  The drain was functional.  Steri-Strips and glue were placed.  She tolerated this well was extubated and transferred to recovery stable.

## 2021-05-14 NOTE — Anesthesia Preprocedure Evaluation (Signed)
Anesthesia Evaluation  Patient identified by MRN, date of birth, ID band Patient awake    Reviewed: Allergy & Precautions, H&P , NPO status , Patient's Chart, lab work & pertinent test results  Airway Mallampati: II  TM Distance: >3 FB Neck ROM: Full    Dental no notable dental hx.    Pulmonary neg pulmonary ROS, former smoker,    Pulmonary exam normal breath sounds clear to auscultation       Cardiovascular hypertension, Normal cardiovascular exam Rhythm:Regular Rate:Normal     Neuro/Psych negative neurological ROS  negative psych ROS   GI/Hepatic negative GI ROS, Neg liver ROS,   Endo/Other  negative endocrine ROS  Renal/GU negative Renal ROS  negative genitourinary   Musculoskeletal negative musculoskeletal ROS (+)   Abdominal   Peds negative pediatric ROS (+)  Hematology negative hematology ROS (+)   Anesthesia Other Findings   Reproductive/Obstetrics negative OB ROS                             Anesthesia Physical Anesthesia Plan  ASA: 2  Anesthesia Plan: General   Post-op Pain Management:    Induction: Intravenous  PONV Risk Score and Plan: 3 and Ondansetron, Dexamethasone and Treatment may vary due to age or medical condition  Airway Management Planned: Oral ETT  Additional Equipment:   Intra-op Plan:   Post-operative Plan: Extubation in OR  Informed Consent: I have reviewed the patients History and Physical, chart, labs and discussed the procedure including the risks, benefits and alternatives for the proposed anesthesia with the patient or authorized representative who has indicated his/her understanding and acceptance.     Dental advisory given  Plan Discussed with: CRNA and Surgeon  Anesthesia Plan Comments:         Anesthesia Quick Evaluation

## 2021-05-14 NOTE — Brief Op Note (Signed)
05/14/2021  3:21 PM  PATIENT:  Rita Cole  60 y.o. female  PRE-OPERATIVE DIAGNOSIS:  LEFT BREAST CANCER  POST-OPERATIVE DIAGNOSIS:  LEFT BREAST CANCER  PROCEDURE:  Procedure(s): LEFT MODIFIED RADICAL MASTECTOMY (Left)  SURGEON:  Surgeon(s) and Role:    * Rolm Bookbinder, MD - Primary  PHYSICIAN ASSISTANT: none  ASSISTANTS: none   ANESTHESIA:   general with pec block  EBL: 50 cc  BLOOD ADMINISTERED:none  DRAINS:  19 Fr Blake drain   SPECIMEN:  Mastectomy with ALND  DISPOSITION OF SPECIMEN:  PATHOLOGY  COUNTS:  YES  TOURNIQUET:  * No tourniquets in log *  DICTATION: .Dragon Dictation  PLAN OF CARE: Admit for overnight observation  PATIENT DISPOSITION:  PACU - hemodynamically stable.   Delay start of Pharmacological VTE agent (>24hrs) due to surgical blood loss or risk of bleeding: not applicable

## 2021-05-14 NOTE — Anesthesia Procedure Notes (Signed)
Anesthesia Regional Block: Pectoralis block   Pre-Anesthetic Checklist: , timeout performed,  Correct Patient, Correct Site, Correct Laterality,  Correct Procedure, Correct Position, site marked,  Risks and benefits discussed,  Surgical consent,  Pre-op evaluation,  At surgeon's request and post-op pain management  Laterality: Left  Prep: chloraprep       Needles:  Injection technique: Single-shot  Needle Type: Echogenic Needle     Needle Length: 9cm      Additional Needles:   Procedures:,,,, ultrasound used (permanent image in chart),,    Narrative:  Start time: 05/14/2021 12:50 PM End time: 05/14/2021 12:57 PM Injection made incrementally with aspirations every 5 mL.  Performed by: Personally  Anesthesiologist: Myrtie Soman, MD  Additional Notes: Patient tolerated the procedure well without complications

## 2021-05-14 NOTE — Anesthesia Procedure Notes (Signed)
Procedure Name: LMA Insertion Date/Time: 05/14/2021 1:17 PM Performed by: Minerva Ends, CRNA Pre-anesthesia Checklist: Patient identified, Emergency Drugs available, Suction available and Patient being monitored Patient Re-evaluated:Patient Re-evaluated prior to induction Oxygen Delivery Method: Circle system utilized Preoxygenation: Pre-oxygenation with 100% oxygen Induction Type: IV induction Ventilation: Mask ventilation without difficulty LMA: LMA inserted LMA Size: 4.0 Tube type: Oral Number of attempts: 1 Placement Confirmation: positive ETCO2 and breath sounds checked- equal and bilateral Tube secured with: Tape Dental Injury: Teeth and Oropharynx as per pre-operative assessment

## 2021-05-15 ENCOUNTER — Encounter (HOSPITAL_COMMUNITY): Payer: Self-pay | Admitting: General Surgery

## 2021-05-15 DIAGNOSIS — C50412 Malignant neoplasm of upper-outer quadrant of left female breast: Secondary | ICD-10-CM | POA: Diagnosis not present

## 2021-05-15 MED ORDER — CHLORHEXIDINE GLUCONATE CLOTH 2 % EX PADS
6.0000 | MEDICATED_PAD | Freq: Every day | CUTANEOUS | Status: DC
  Administered 2021-05-15: 6 via TOPICAL

## 2021-05-15 MED ORDER — METHOCARBAMOL 750 MG PO TABS: 750.0000 mg | ORAL_TABLET | Freq: Three times a day (TID) | ORAL | 0 refills | Status: DC | PRN

## 2021-05-15 NOTE — Progress Notes (Signed)
Patient ID: Rita Cole, female   DOB:  1961-03-25, 60 y.o.   MRN: [id number]  An After Visit Summary was printed and given to the patient.   Patient education given on medication changes and follow up appointments and the patient expresses understanding and acceptance of instructions.   Patient waiting for her daughter to arrive and drive home.  [name] 05/15/2021 11:03 AM

## 2021-05-15 NOTE — Discharge Summary (Signed)
Physician Discharge Summary  Patient ID: Rita Cole MRN: [id number] DOB/AGE: 59-Jul-1962 59 y.o.  Admit date: 05/14/2021 Discharge date: 05/15/2021  Admission Diagnoses: Left breast cancer s/p primary chemotherapy htn  Discharge Diagnoses:  Principal Problem:   S/P mastectomy, left   Discharged Condition: good  Hospital Course: 60 yof who underwent primary chemotherapy followed by left MRM. She is doing well following am and will be discharged home  Consults: None  Significant Diagnostic Studies: none  Treatments: surgery: left mrm  Discharge Exam: Blood pressure 127/60, pulse 68, temperature 97.8 F (36.6 C), temperature source Oral, resp. rate 18, height 5\' 5"  (1.651 m), weight 95.3 kg, SpO2 99 %. Incision/Wound:incisoin clean, no hematoma  Disposition: Discharge disposition: 01-Home or Self Care        Allergies as of 05/15/2021   No Known Allergies      Medication List     TAKE these medications    amLODipine 10 MG tablet Commonly known as: NORVASC Take 1 tablet (10 mg total) by mouth daily.   calcium carbonate 500 MG chewable tablet Commonly known as: TUMS - dosed in mg elemental calcium Chew 1 tablet by mouth daily as needed for indigestion or heartburn.   cyclobenzaprine 10 MG tablet Commonly known as: FLEXERIL Take 10 mg by mouth 3 (three) times daily as needed for muscle spasms.   ferrous sulfate 325 (65 FE) MG tablet Take 325 mg by mouth daily with breakfast.   hydrochlorothiazide 25 MG tablet Commonly known as: HYDRODIURIL Take 1 tablet (25 mg total) by mouth daily.   hydroxypropyl methylcellulose / hypromellose 2.5 % ophthalmic solution Commonly known as: ISOPTO TEARS / GONIOVISC Place 1 drop into both eyes 3 (three) times daily as needed for dry eyes.   ketorolac 10 MG tablet Commonly known as: TORADOL Take 1 tablet (10 mg total) by mouth every 6 (six) hours as needed.   lidocaine-prilocaine cream Commonly known as:  EMLA Apply to affected area once What changed:  how much to take how to take this when to take this reasons to take this additional instructions   losartan 100 MG tablet Commonly known as: COZAAR Take 100 mg by mouth daily.   meloxicam 15 MG tablet Commonly known as: Mobic Take 1 tablet (15 mg total) by mouth daily.   methocarbamol 750 MG tablet Commonly known as: ROBAXIN Take 1 tablet (750 mg total) by mouth every 8 (eight) hours as needed (use for muscle cramps/pain).   metoprolol succinate 50 MG 24 hr tablet Commonly known as: TOPROL-XL Take 50 mg by mouth daily.   ondansetron 8 MG tablet Commonly known as: Zofran Take 1 tablet (8 mg total) by mouth 2 (two) times daily as needed. Start on the third day after carboplatin and AC chemotherapy.   prochlorperazine 10 MG tablet Commonly known as: COMPAZINE Take 1 tablet (10 mg total) by mouth every 6 (six) hours as needed (Nausea or vomiting).   pyridoxine 100 MG tablet Commonly known as: B-6 Take 100 mg by mouth daily.   triamterene-hydrochlorothiazide 37.5-25 MG tablet Commonly known as: MAXZIDE-25 Take 1 tablet by mouth daily.   vitamin B-12 500 MCG tablet Commonly known as: CYANOCOBALAMIN Take 500 mcg by mouth daily.   Vitamin D (Ergocalciferol) 1.25 MG (50000 UNIT) Caps capsule Commonly known as: DRISDOL Take 50,000 Units by mouth once a week. Monday        Follow-up Information     Rolm Bookbinder, MD Follow up in 3 week(s).   Specialty: General Surgery  Contact information: [address] [place] [address] [phone]                 Signed: Rolm Bookbinder 05/15/2021, 9:02 AM

## 2021-05-15 NOTE — Discharge Instructions (Signed)
[facility], [place] [phone]  MASTECTOMY: POST OP INSTRUCTIONS Take 400 mg of ibuprofen every 8 hours or 650 mg tylenol every 6 hours for next 72 hours then as needed. Use ice several times daily also. Always review your discharge instruction sheet given to you by the facility where your surgery was performed. IF YOU HAVE DISABILITY OR FAMILY LEAVE FORMS, YOU MUST BRING THEM TO THE OFFICE FOR PROCESSING.   DO NOT GIVE THEM TO YOUR DOCTOR. A prescription for pain medication may be given to you upon discharge.  Take your pain medication as prescribed, if needed.  If narcotic pain medicine is not needed, then you may take acetaminophen (Tylenol), naprosyn (Alleve) or ibuprofen (Advil) as needed. Take your usually prescribed medications unless otherwise directed. If you need a refill on your pain medication, please contact your pharmacy.  They will contact our office to request authorization.  Prescriptions will not be filled after 5pm or on week-ends. You should follow a light diet the first few days after arrival home, such as soup and crackers, etc.  Resume your normal diet the day after surgery. Most patients will experience some swelling and bruising on the chest and underarm.  Ice packs will help.  Swelling and bruising can take several days to resolve. Wear the binder day and night until you return to the office.  It is common to experience some constipation if taking pain medication after surgery.  Increasing fluid intake and taking a stool softener (such as Colace) will usually help or prevent this problem from occurring.  A mild laxative (Milk of Magnesia or Miralax) should be taken according to package instructions if there are no bowel movements after 48 hours. Unless discharge instructions indicate otherwise, leave your bandage dry and in place until your next appointment in 3-5 days.  You may take a limited sponge bath.  No tube baths or showers until the drains are removed.   You may have steri-strips (small skin tapes) in place directly over the incision.  These strips should be left on the skin for 7-10 days. If you have glue it will come off in next couple week.  Any sutures will be removed at an office visit DRAINS:  If you have drains in place, it is important to keep a list of the amount of drainage produced each day in your drains.  Before leaving the hospital, you should be instructed on drain care.  Call our office if you have any questions about your drains. I will remove your drains when they put out less than 30 cc or ml for 2 consecutive days. ACTIVITIES:  You may resume regular (light) daily activities beginning the next day--such as daily self-care, walking, climbing stairs--gradually increasing activities as tolerated.  You may have sexual intercourse when it is comfortable.  Refrain from any heavy lifting or straining until approved by your doctor. You may drive when you are no longer taking prescription pain medication, you can comfortably wear a seatbelt, and you can safely maneuver your car and apply brakes. RETURN TO WORK:  __________________________________________________________ Dennis Bast should see your doctor in the office for a follow-up appointment approximately 3-5 days after your surgery.  Your doctor's nurse will typically make your follow-up appointment when she calls you with your pathology report.  Expect your pathology report 3-4business days after surgery. OTHER INSTRUCTIONS: ______________________________________________________________________________________________ ____________________________________________________________________________________________ WHEN TO CALL YOUR DR Roran Wegner: Fever over 101.0 Nausea and/or vomiting Extreme swelling or bruising Continued bleeding from incision. Increased pain, redness,  or drainage from the incision. The clinic staff is available to answer your questions during regular business hours.  Please don't  hesitate to call and ask to speak to one of the nurses for clinical concerns.  If you have a medical emergency, go to the nearest emergency room or call 911.  A surgeon from [facility] is always on call at the hospital. [address] [place], [place], [place]  [address] ? [address], [place], [place]   [address] [phone] ? [phone] ? FAX [phone] Web site: [email or web address]

## 2021-05-19 ENCOUNTER — Encounter: Payer: Self-pay | Admitting: *Deleted

## 2021-05-25 ENCOUNTER — Encounter (HOSPITAL_COMMUNITY): Payer: Self-pay | Admitting: General Surgery

## 2021-05-25 MED ORDER — HEMOSTATIC AGENTS (NO CHARGE) OPTIME
TOPICAL | Status: DC | PRN
  Administered 2021-05-14 (×2): 1 via TOPICAL

## 2021-05-26 NOTE — Progress Notes (Signed)
Patient Care Team: Riki Sheer, NP as PCP - General (Nurse Practitioner) Mauro Kaufmann, RN as Oncology Nurse Navigator Rockwell Germany, RN as Oncology Nurse Navigator Rolm Bookbinder, MD as Consulting Physician (General Surgery) Nicholas Lose, MD as Consulting Physician (Hematology and Oncology) Kyung Rudd, MD as Consulting Physician (Radiation Oncology)  DIAGNOSIS:    ICD-10-CM   1. Malignant neoplasm of upper-outer quadrant of left breast in female, estrogen receptor negative ([place])  C50.412    Z17.1       SUMMARY OF ONCOLOGIC HISTORY: Oncology History  Malignant neoplasm of upper-outer quadrant of left breast in female, estrogen receptor negative ([place])  09/30/2020 Initial Diagnosis   Swollen left breast: Mammogram detected left breast mass UOQ skin thickening with 3 abnormal lymph nodes that are matted and hard, ultrasound 2.7 cm, 2.9 cm, 3 cm and 3 lymph nodes biopsy revealed grade 2-3 IDC with DCIS ER/PR negative HER-2 IHC equivocal FISH pending, Ki-67 60%, lymph node biopsy positive   10/01/2020 Cancer Staging   Staging form: Breast, AJCC 8th Edition - Clinical stage from 10/01/2020: Stage IIIC (cT4b, cN2a, cM0, G3, ER-, PR-, HER2: Equivocal) - Signed by Nicholas Lose, MD on 10/01/2020 Stage prefix: Initial diagnosis Histologic grading system: 3 grade system    10/13/2020 Genetic Testing   Negative hereditary cancer genetic testing: no pathogenic variants detected in [facility] CustomNext-Cancer +RNAinsight Panel.  The report date is October 13, 2020.   The CustomNext-Cancer+RNAinsight panel offered by [facility] includes sequencing and rearrangement analysis for the following 47 genes:  APC, ATM, AXIN2, BARD1, BMPR1A, BRCA1, BRCA2, BRIP1, CDH1, CDK4, CDKN2A, CHEK2, DICER1, EPCAM, GREM1, HOXB13, MEN1, MLH1, MSH2, MSH3, MSH6, MUTYH, NBN, NF1, NF2, NTHL1, PALB2, PMS2, POLD1, POLE, PTEN, RAD51C, RAD51D, RECQL, RET, SDHA, SDHAF2, SDHB, SDHC, SDHD, SMAD4, SMARCA4, STK11, TP53,  TSC1, TSC2, and VHL.  RNA data is routinely analyzed for use in variant interpretation for all genes.   01/13/2021 -  Chemotherapy   Patient is on Treatment Plan : BREAST Pembrolizumab + AC q21d x 4 cycles / Pembrolizumab + Carboplatin D1 + Paclitaxel D1,8,15 q21d X 4 cycles     05/14/2021 Surgery   Left modified radical mastectomy: Metastatic carcinoma in 3/7 lymph nodes, no residual invasive cancer in the breast, no extranodal extension, ER 0%, PR 0%, HER2 negative, Ki-67 60%     CHIEF COMPLIANT: Follow-up of left breast cancer  INTERVAL HISTORY: Rita Cole is a 60 y.o. with above-mentioned history of left breast cancer having completed neoadjuvant chemotherapy with Taxol and Keytruda. She presents to the clinic today for follow-up.   ALLERGIES:  has No Known Allergies.  MEDICATIONS:  Current Outpatient Medications  Medication Sig Dispense Refill   amLODipine (NORVASC) 10 MG tablet Take 1 tablet (10 mg total) by mouth daily.     calcium carbonate (TUMS - DOSED IN MG ELEMENTAL CALCIUM) 500 MG chewable tablet Chew 1 tablet by mouth daily as needed for indigestion or heartburn.     cyclobenzaprine (FLEXERIL) 10 MG tablet Take 10 mg by mouth 3 (three) times daily as needed for muscle spasms.     ferrous sulfate 325 (65 FE) MG tablet Take 325 mg by mouth daily with breakfast.     hydrochlorothiazide (HYDRODIURIL) 25 MG tablet Take 1 tablet (25 mg total) by mouth daily. (Patient not taking: No sig reported) 30 tablet 0   hydroxypropyl methylcellulose / hypromellose (ISOPTO TEARS / GONIOVISC) 2.5 % ophthalmic solution Place 1 drop into both eyes 3 (three) times daily as  needed for dry eyes.     ketorolac (TORADOL) 10 MG tablet Take 1 tablet (10 mg total) by mouth every 6 (six) hours as needed. (Patient not taking: No sig reported) 20 tablet 0   lidocaine-prilocaine (EMLA) cream Apply to affected area once (Patient taking differently: Apply 1 application topically daily as needed  (prior to port access).) 30 g 3   losartan (COZAAR) 100 MG tablet Take 100 mg by mouth daily.     meloxicam (MOBIC) 15 MG tablet Take 1 tablet (15 mg total) by mouth daily. (Patient not taking: No sig reported) 10 tablet 0   methocarbamol (ROBAXIN) 750 MG tablet Take 1 tablet (750 mg total) by mouth every 8 (eight) hours as needed (use for muscle cramps/pain). 30 tablet 0   metoprolol succinate (TOPROL-XL) 50 MG 24 hr tablet Take 50 mg by mouth daily.     ondansetron (ZOFRAN) 8 MG tablet Take 1 tablet (8 mg total) by mouth 2 (two) times daily as needed. Start on the third day after carboplatin and AC chemotherapy. 30 tablet 1   prochlorperazine (COMPAZINE) 10 MG tablet Take 1 tablet (10 mg total) by mouth every 6 (six) hours as needed (Nausea or vomiting). 30 tablet 1   pyridoxine (B-6) 100 MG tablet Take 100 mg by mouth daily.     triamterene-hydrochlorothiazide (MAXZIDE-25) 37.5-25 MG tablet Take 1 tablet by mouth daily.     vitamin B-12 (CYANOCOBALAMIN) 500 MCG tablet Take 500 mcg by mouth daily.     Vitamin D, Ergocalciferol, (DRISDOL) 1.25 MG (50000 UNIT) CAPS capsule Take 50,000 Units by mouth once a week. Monday     No current facility-administered medications for this visit.    PHYSICAL EXAMINATION: ECOG PERFORMANCE STATUS: 1 - Symptomatic but completely ambulatory  Vitals:   05/27/21 0845  BP: (!) 154/87  Pulse: 96  Resp: 18  Temp: 97.8 F (36.6 C)  SpO2: 100%   Filed Weights   05/27/21 0845  Weight: 210 lb 14.4 oz (95.7 kg)    BREAST: No palpable masses or nodules in either right or left breasts. No palpable axillary supraclavicular or infraclavicular adenopathy no breast tenderness or nipple discharge. (exam performed in the presence of a chaperone)  LABORATORY DATA:  I have reviewed the data as listed CMP Latest Ref Rng & Units 05/11/2021 04/14/2021 04/08/2021  Glucose 70 - 99 mg/dL 115(H) 120(H) 158(H)  BUN 6 - 20 mg/dL 20 15 24(H)  Creatinine 0.44 - 1.00 mg/dL  1.10(H) 1.05(H) 1.06(H)  Sodium 135 - 145 mmol/L 141 143 139  Potassium 3.5 - 5.1 mmol/L 3.3(L) 3.2(L) 3.2(L)  Chloride 98 - 111 mmol/L 101 103 101  CO2 22 - 32 mmol/L $RemoveB'28 28 31  'LBLnRSKc$ Calcium 8.9 - 10.3 mg/dL 9.4 10.0 9.4  Total Protein 6.5 - 8.1 g/dL - 7.7 7.1  Total Bilirubin 0.3 - 1.2 mg/dL - 0.5 0.6  Alkaline Phos 38 - 126 U/L - 63 64  AST 15 - 41 U/L - 22 26  ALT 0 - 44 U/L - 21 24    Lab Results  Component Value Date   WBC 10.0 05/11/2021   HGB 11.2 (L) 05/11/2021   HCT 34.5 (L) 05/11/2021   MCV 99.1 05/11/2021   PLT 255 05/11/2021   NEUTROABS 2.7 04/14/2021    ASSESSMENT & PLAN:  Malignant neoplasm of upper-outer quadrant of left breast in female, estrogen receptor negative ([place]) 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 Neoadjuvant chemotherapy with Adriamycin and Cytoxan dose dense 4 followed by Taxol weekly 12 with carboplatin every 3 weeks x4 (pembrolizumab will be added if she is triple negative) 2. 05/14/2021:Left modified radical mastectomy: Metastatic carcinoma in 3/7 lymph nodes, no residual invasive cancer in the breast, no extranodal extension, ER 0%, PR 0%, HER2 negative, Ki-67 60% 3. Followed by adjuvant radiation therapy PET CT scan: Known breast cancer and lymphadenopathy in the axilla, supraclavicular, pectoral, internal mammary lymph nodes, no distant metastatic disease -------------------------------------------------------------------------- Pathology counseling: I discussed the final pathology report of the patient provided  a copy of this report. I discussed the margins as well as lymph node surgeries. We also discussed the final staging along with previously  performed ER/PR and HER-2/neu testing. Patient had a great response in the breast.  Given the lymph nodes she responded well but she still had residual disease which puts her at a high risk category.  Treatment plan: Continue with pembrolizumab maintenance therapy every 3 weeks, adjuvant radiation Will start pembrolizumab next week.  No orders of the defined types were placed in this encounter.  The patient has a good understanding of the overall plan. she agrees with it. she will call with any problems that may develop before the next visit here.  Total time spent: 30 mins including face to face time and time spent for planning, charting and coordination of care  Rulon Eisenmenger, MD, MPH 05/27/2021  I, Thana Ates, am acting as scribe for Dr. Nicholas Lose.  I have reviewed the above documentation for accuracy and completeness, and I agree with the above.

## 2021-05-27 ENCOUNTER — Other Ambulatory Visit: Payer: Self-pay

## 2021-05-27 ENCOUNTER — Encounter: Payer: Self-pay | Admitting: *Deleted

## 2021-05-27 ENCOUNTER — Encounter (HOSPITAL_COMMUNITY): Payer: Self-pay

## 2021-05-27 ENCOUNTER — Inpatient Hospital Stay: Payer: Federal, State, Local not specified - PPO

## 2021-05-27 ENCOUNTER — Inpatient Hospital Stay
Payer: Federal, State, Local not specified - PPO | Attending: Hematology and Oncology | Admitting: Hematology and Oncology

## 2021-05-27 VITALS — BP 154/87 | HR 96 | Temp 97.8°F | Resp 18 | Ht 65.0 in | Wt 210.9 lb

## 2021-05-27 DIAGNOSIS — Z171 Estrogen receptor negative status [ER-]: Secondary | ICD-10-CM

## 2021-05-27 DIAGNOSIS — C779 Secondary and unspecified malignant neoplasm of lymph node, unspecified: Secondary | ICD-10-CM | POA: Insufficient documentation

## 2021-05-27 DIAGNOSIS — Z95828 Presence of other vascular implants and grafts: Secondary | ICD-10-CM

## 2021-05-27 DIAGNOSIS — Z452 Encounter for adjustment and management of vascular access device: Secondary | ICD-10-CM | POA: Insufficient documentation

## 2021-05-27 DIAGNOSIS — C50412 Malignant neoplasm of upper-outer quadrant of left female breast: Secondary | ICD-10-CM | POA: Insufficient documentation

## 2021-05-27 MED ORDER — SODIUM CHLORIDE 0.9% FLUSH
10.0000 mL | Freq: Once | INTRAVENOUS | Status: AC
  Administered 2021-05-27: 10 mL

## 2021-05-27 MED ORDER — HEPARIN SOD (PORK) LOCK FLUSH 100 UNIT/ML IV SOLN
500.0000 [IU] | Freq: Once | INTRAVENOUS | Status: AC
  Administered 2021-05-27: 500 [IU]

## 2021-05-27 NOTE — Progress Notes (Signed)
ON PATHWAY REGIMEN - Breast  No Change  Continue With Treatment as Ordered.  Original Decision Date/Time: 10/01/2020 14:56     Cycles 1 through 4: A cycle is every 21 days:     Pembrolizumab      Paclitaxel      Carboplatin      Filgrastim-xxxx    Cycles 5 through 8: A cycle is every 21 days:     Pembrolizumab      Doxorubicin      Cyclophosphamide      Pegfilgrastim-xxxx   **Always confirm dose/schedule in your pharmacy ordering system**  Patient Characteristics: Preoperative or Nonsurgical Candidate (Clinical Staging), Neoadjuvant Therapy followed by Surgery, Invasive Disease, Chemotherapy, HER2 Negative/Unknown/Equivocal, ER Negative/Unknown, Platinum Therapy Indicated, High-Risk Disease Present Therapeutic Status: Preoperative or Nonsurgical Candidate (Clinical Staging) AJCC M Category: cM0 AJCC Grade: G3 Breast Surgical Plan: Neoadjuvant Therapy followed by Surgery ER Status: Negative (-) AJCC 8 Stage Grouping: IIIC HER2 Status: Negative (-) AJCC T Category: cT4b AJCC N Category: cN2a PR Status: Negative (-) Type of Therapy: Platinum Therapy Indicated Intent of Therapy: Curative Intent, Not Discussed with Patient

## 2021-05-27 NOTE — Progress Notes (Signed)
DISCONTINUE ON PATHWAY REGIMEN - Breast     Cycles 1 through 4: A cycle is every 21 days:     Pembrolizumab      Paclitaxel      Carboplatin      Filgrastim-xxxx    Cycles 5 through 8: A cycle is every 21 days:     Pembrolizumab      Doxorubicin      Cyclophosphamide      Pegfilgrastim-xxxx   **Always confirm dose/schedule in your pharmacy ordering system**  REASON: Other Reason PRIOR TREATMENT: [id number]: Pembrolizumab 200 mg D1 + Paclitaxel 80 mg/m2 D1, 8, 15 + Carboplatin AUC=5 D1 q21 Days x 12 Weeks, Followed by Pembrolizumab 200 mg + Doxorubicin + Cyclophosphamide q21 Days x 12 Weeks, Followed by Surgery TREATMENT RESPONSE: N/A - Adjuvant Therapy  START ON PATHWAY REGIMEN - Breast     A cycle is every 21 days:     Pembrolizumab   **Always confirm dose/schedule in your pharmacy ordering system**  Patient Characteristics: Post-Neoadjuvant Therapy and Resection, HER2 Negative/Unknown/Equivocal, Residual Disease, ER Negative/Unknown, Received Neoadjuvant Pembrolizumab + Chemotherapy Therapeutic Status: Post-Neoadjuvant Therapy and Resection Residual Invasive Disease Post-Neoadjuvant Therapy<= Yes ER Status: Negative (-) HER2 Status: Negative (-) PR Status: Negative (-) BRCA Mutation Status: BRCA Wild-Type Intent of Therapy: Curative Intent, Discussed with Patient

## 2021-05-27 NOTE — Assessment & Plan Note (Signed)
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 Neoadjuvant chemotherapy with Adriamycin and Cytoxan dose dense 4 followed by Taxol weekly 12 with carboplatin every 3 weeks x4(pembrolizumab will be added if she is triple negative) 2. 05/14/2021:Left modified radical mastectomy: Metastatic carcinoma in 3/7 lymph nodes, no residual invasive cancer in the breast, no extranodal extension, ER 0%, PR 0%, HER2 negative, Ki-67 60% 3. Followed by adjuvant radiation therapy PET CT scan: Known breast cancer and lymphadenopathy in the axilla, supraclavicular, pectoral, internal mammary lymph nodes, no distant metastatic disease -------------------------------------------------------------------------- Pathology counseling: I discussed the final pathology report of the patient provided  a copy of this report. I discussed the margins as well as lymph node surgeries. We also discussed the final staging along with previously performed ER/PR and HER-2/neu testing.  Treatment plan: Continue with pembrolizumab maintenance therapy every 3 weeks, adjuvant radiation

## 2021-05-29 ENCOUNTER — Ambulatory Visit: Payer: Federal, State, Local not specified - PPO

## 2021-05-29 NOTE — Progress Notes (Signed)
Pharmacist Chemotherapy Monitoring - Initial Assessment    Anticipated start date: 06/05/21    The following has been reviewed per standard work regarding the patient's treatment regimen: The patient's diagnosis, treatment plan and drug doses, and organ/hematologic function Lab orders and baseline tests specific to treatment regimen  The treatment plan start date, drug sequencing, and pre-medications Prior authorization status  Patient's documented medication list, including drug-drug interaction screen and prescriptions for anti-emetics and supportive care specific to the treatment regimen The drug concentrations, fluid compatibility, administration routes, and timing of the medications to be used The patient's access for treatment and lifetime cumulative dose history, if applicable  The patient's medication allergies and previous infusion related reactions, if applicable   Changes made to treatment plan:  N/A  Follow up needed:  N/A   Larene Beach, RPH, 05/29/2021  1:02 PM

## 2021-06-01 ENCOUNTER — Encounter: Payer: Self-pay | Admitting: *Deleted

## 2021-06-02 LAB — SURGICAL PATHOLOGY

## 2021-06-03 ENCOUNTER — Inpatient Hospital Stay
Admission: RE | Admit: 2021-06-03 | Discharge: 2021-06-03 | Disposition: A | Discharge status: Home / Self Care | Payer: Self-pay | Source: Ambulatory Visit | Attending: Radiation Oncology | Admitting: Radiation Oncology

## 2021-06-03 ENCOUNTER — Ambulatory Visit
Admission: RE | Admit: 2021-06-03 | Discharge: 2021-06-03 | Disposition: A | Discharge status: Home / Self Care | Payer: Self-pay | Source: Ambulatory Visit | Attending: Radiation Oncology | Admitting: Radiation Oncology

## 2021-06-03 ENCOUNTER — Other Ambulatory Visit: Payer: Self-pay | Admitting: Radiation Oncology

## 2021-06-03 DIAGNOSIS — C50412 Malignant neoplasm of upper-outer quadrant of left female breast: Secondary | ICD-10-CM

## 2021-06-03 DIAGNOSIS — Z171 Estrogen receptor negative status [ER-]: Secondary | ICD-10-CM

## 2021-06-04 ENCOUNTER — Other Ambulatory Visit: Payer: Self-pay

## 2021-06-04 ENCOUNTER — Encounter: Payer: Self-pay | Admitting: Physical Therapy

## 2021-06-04 ENCOUNTER — Ambulatory Visit: Payer: Federal, State, Local not specified - PPO | Attending: General Surgery | Admitting: Physical Therapy

## 2021-06-04 DIAGNOSIS — Z483 Aftercare following surgery for neoplasm: Secondary | ICD-10-CM | POA: Insufficient documentation

## 2021-06-04 DIAGNOSIS — R293 Abnormal posture: Secondary | ICD-10-CM | POA: Insufficient documentation

## 2021-06-04 DIAGNOSIS — Z171 Estrogen receptor negative status [ER-]: Secondary | ICD-10-CM | POA: Diagnosis present

## 2021-06-04 DIAGNOSIS — C50412 Malignant neoplasm of upper-outer quadrant of left female breast: Secondary | ICD-10-CM | POA: Diagnosis not present

## 2021-06-04 NOTE — Patient Instructions (Addendum)
     [facility]  [address]  [place] [address]  [phone]  After Breast Cancer Class It is recommended you attend the ABC class to be educated on lymphedema risk reduction. This class is free of charge and lasts for 1 hour. It is a 1-time class. You will need to download the Webex app either on your phone or computer. We will send you a link the night before or the morning of the class. You should be able to click on that link to join the class. This is not a confidential class. You don't have to turn your camera on, but other participants may be able to see your email address. You are scheduled for December 19th, 2022.  Scar massage You can begin gentle scar massage to you incision sites. Gently place one hand on the incision and move the skin (without sliding on the skin) in various directions. Do this for a few minutes and then you can gently massage either coconut oil or vitamin E cream into the scars. YOU CAN START THIS AFTER THE STERI-STRIPS COME OFF AND IT IS COMPLETELY HEALED.  Compression garment You should continue wearing your compression bra until you feel like you no longer have swelling.  Home exercise Program Continue doing the exercises you were given until you feel like you can do them without feeling any tightness at the end.   Walking Program Studies show that 30 minutes of walking per day (fast enough to elevate your heart rate) can significantly reduce the risk of a cancer recurrence. If you can't walk due to other medical reasons, we encourage you to find another activity you could do (like a stationary bike or water exercise).  Posture After breast cancer surgery, people frequently sit with rounded shoulders posture because it puts their incisions on slack and feels better. If you sit like this and scar tissue forms in that position, you can become very tight and have pain sitting or standing with good posture. Try to be aware of  your posture and sit and stand up tall to heal properly.  Follow up PT: It is recommended you return every 3 months for the first 3 years following surgery to be assessed on the SOZO machine for an L-Dex score. This helps prevent clinically significant lymphedema in 95% of patients. These follow up screens are 10 minute appointments that you are not billed for. You are scheduled for February 13th at 9:40.

## 2021-06-04 NOTE — Therapy (Signed)
[facility] @ [address] [place], [place], [address] Phone: [phone]   Fax:  [phone]  Physical Therapy Treatment  Patient Details  Name: Rita Cole MRN: [id number] Date of Birth: [birth year] Referring Provider (PT): Dr. Rolm Bookbinder   Encounter Date: 06/04/2021   PT End of Session - 06/04/21 1043     Visit Number 2    Number of Visits 2    PT Start Time 1010    PT Stop Time 1050    PT Time Calculation (min) 40 min    Activity Tolerance Patient tolerated treatment well    Behavior During Therapy [facility] for tasks assessed/performed             Past Medical History:  Diagnosis Date   Breast cancer ([place])    Family history of lung cancer 10/02/2020   Hypertension    Pre-diabetes     Past Surgical History:  Procedure Laterality Date   BREAST SURGERY     BUNIONECTOMY     IR IMAGING GUIDED PORT INSERTION  10/14/2020   MODIFIED MASTECTOMY Left 05/14/2021   Procedure: LEFT MODIFIED RADICAL MASTECTOMY;  Surgeon: Rolm Bookbinder, MD;  Location: [facility];  Service: General;  Laterality: Left;   MYOMECTOMY     TONSILLECTOMY     as a child    There were no vitals filed for this visit.   Subjective Assessment - 06/04/21 1012     Subjective Patient reports she underwent neoadjuvant chemotherapy from 01/13/2021-04/14/2021 followed by a left modified radical mastectomy on 05/14/2021 with 3 of 7 nodes positive. She will undergo radiation beginning next week.    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 She underwent neoadjuvant chemotherapy from 01/13/2021-04/14/2021 followed by a left modified radical mastectomy on 05/14/2021 with 3 of 7 nodes positive. It is triple negative with a Ki67 of 60%.    Patient Stated Goals Get my arm back to normal    Currently in Pain? No/denies                [facility] PT Assessment - 06/04/21 0001        Assessment   Medical Diagnosis s/p left mastectomy with targeted node dissection    Referring Provider (PT) Dr. Rolm Bookbinder    Onset Date/Surgical Date 05/14/21    Hand Dominance Right    Prior Therapy Baselines      Precautions   Precautions Other (comment)    Precaution Comments left arm lymphedema risk      Balance Screen   Has the patient fallen in the past 6 months No    Has the patient had a decrease in activity level because of a fear of falling?  No    Is the patient reluctant to leave their home because of a fear of falling?  No      Home Environment   Living Environment Private residence    Living Arrangements Alone    Available Help at Discharge Family      Prior Function   Level of [place] Retired    Leisure She does not exercise      Cognition   Overall Cognitive Status Within Functional Limits for tasks assessed      Observation/Other Assessments   Observations Left chest incision appears to be healing well. It is still covered with steri-strips. She has 1 area at her mid incision that  does not have steri-strips on it and is developing a scab. No significant edema. Issued knitted knocker.      Posture/Postural Control   Posture/Postural Control Postural limitations    Postural Limitations Rounded Shoulders;Forward head      ROM / Strength   AROM / PROM / Strength AROM      AROM   AROM Assessment Site Shoulder    Right/Left Shoulder Right;Left    Left Shoulder Extension 39 Degrees    Left Shoulder Flexion 118 Degrees    Left Shoulder ABduction 125 Degrees    Left Shoulder Internal Rotation 77 Degrees    Left Shoulder External Rotation 67 Degrees               LYMPHEDEMA/ONCOLOGY QUESTIONNAIRE - 06/04/21 0001       Type   Cancer Type Left breast cancer      Surgeries   Mastectomy Date 05/14/21    Axillary Lymph Node Dissection Date 05/14/21    Number Lymph Nodes Removed 7      Treatment   Active  Chemotherapy Treatment Yes    Date 04/14/21    Past Chemotherapy Treatment No    Active Radiation Treatment No    Past Radiation Treatment No    Current Hormone Treatment No    Past Hormone Therapy No      What other symptoms do you have   Are you Having Heaviness or Tightness No    Are you having Pain No    Are you having pitting edema No    Is it Hard or Difficult finding clothes that fit No    Do you have infections No    Is there Decreased scar mobility Yes    Stemmer Sign No      Lymphedema Assessments   Lymphedema Assessments Upper extremities      Right Upper Extremity Lymphedema   15 cm Proximal to Olecranon Process 36.1 cm    10 cm Proximal to Olecranon Process 33.4 cm    Olecranon Process 27.4 cm    10 cm Proximal to Ulnar Styloid Process 22.2 cm    Just Proximal to Ulnar Styloid Process 15.7 cm    Across Hand at [employer] 18.7 cm    At [place] of 2nd Digit 6.1 cm      Left Upper Extremity Lymphedema   15 cm Proximal to Olecranon Process 39 cm    10 cm Proximal to Olecranon Process 36.2 cm    Olecranon Process 28 cm    10 cm Proximal to Ulnar Styloid Process 22.5 cm    Just Proximal to Ulnar Styloid Process 16.1 cm    Across Hand at [employer] 18.4 cm    At [place] of 2nd Digit 6 cm                Quick Dash - 06/04/21 0001     Open a tight or new jar No difficulty    Do heavy household chores (wash walls, wash floors) No difficulty    Carry a shopping bag or briefcase No difficulty    Wash your back No difficulty    Use a knife to cut food No difficulty    Recreational activities in which you take some force or impact through your arm, shoulder, or hand (golf, hammering, tennis) Mild difficulty    During the past week, to what extent has your arm, shoulder or hand problem interfered with your normal social activities with family,  friends, neighbors, or groups? Slightly    During the past week, to what extent has your arm, shoulder or hand problem  limited your work or other regular daily activities Slightly    Arm, shoulder, or hand pain. Mild    Tingling (pins and needles) in your arm, shoulder, or hand Mild    Difficulty Sleeping Mild difficulty    DASH Score 13.64 %                             PT Education - 06/04/21 1042     Education Details Aftercare; HEP and importance of walking program    Person(s) Educated Patient;Parent(s)    Methods Explanation;Demonstration;Handout    Comprehension Verbalized understanding;Returned demonstration                 PT Long Term Goals - 06/04/21 1052       PT LONG TERM GOAL #1   Title Patient will demonstrate she has regained shoulder ROM and function post operatively compared to baselines.    Time 6    Period Months    Status Achieved                   Plan - 06/04/21 1049     Clinical Impression Statement Patient is doing well s/p left mastectomy and targeted axillary node dissection with 3 of 7 nodes positive on 05/14/2021. She underwent neoadjuvant chemotherapy prior to surgery and is scheduled for radiation next week. She has regained shoulder ROM within a few degrees, shows no signs of lymphedema, and her incision is healing as expected. She plans to attend the After Breast Cancer class for lymphedema education but otherise has no PT needs at this time.    PT Treatment/Interventions ADLs/Self Care Home Management;Therapeutic exercise;Patient/family education    PT Next Visit Plan D/C    PT Home Exercise Plan Post op HEP    Consulted and Agree with Plan of Care Patient;Family member/caregiver    Family Member Consulted Mom             Patient will benefit from skilled therapeutic intervention in order to improve the following deficits and impairments:  Postural dysfunction, Decreased range of motion, Decreased knowledge of precautions, Impaired UE functional use, Pain  Visit Diagnosis: Malignant neoplasm of upper-outer quadrant of  left breast in female, estrogen receptor negative (HCC)  Abnormal posture  Aftercare following surgery for neoplasm     Problem List Patient Active Problem List   Diagnosis Date Noted   S/P mastectomy, left 05/14/2021   Port-A-Cath in place 11/10/2020   Genetic testing 10/13/2020   Family history of lung cancer 10/02/2020   Malignant neoplasm of upper-outer quadrant of left breast in female, estrogen receptor negative ([place]) 09/30/2020   PHYSICAL THERAPY DISCHARGE SUMMARY  Visits from Start of Care: 2  Current functional level related to goals / functional outcomes: Goals met. See above for objective measurements. Although shoulder ROM is somewhat limited, it is the same as her baseline.   Remaining deficits: None   Education / Equipment: HEP and lymphedema education  Patient agrees to discharge. Patient goals were met. Patient is being discharged due to meeting the stated rehab goals.  Annia Friendly, [place] 06/04/21 10:53 AM    [facility] @ [address] [place], [place], [address] Phone: [phone]   Fax:  [phone]  Name: MARGARIT MINSHALL MRN: [id number] Date  of Birth: December 22, 1960

## 2021-06-05 ENCOUNTER — Encounter: Payer: Self-pay | Admitting: Adult Health

## 2021-06-05 ENCOUNTER — Inpatient Hospital Stay: Payer: Federal, State, Local not specified - PPO

## 2021-06-05 ENCOUNTER — Inpatient Hospital Stay (HOSPITAL_BASED_OUTPATIENT_CLINIC_OR_DEPARTMENT_OTHER): Payer: Federal, State, Local not specified - PPO | Admitting: Adult Health

## 2021-06-05 VITALS — BP 145/88 | HR 73 | Temp 97.9°F | Resp 20 | Ht 65.0 in | Wt 208.6 lb

## 2021-06-05 DIAGNOSIS — Z79899 Other long term (current) drug therapy: Secondary | ICD-10-CM | POA: Insufficient documentation

## 2021-06-05 DIAGNOSIS — Z171 Estrogen receptor negative status [ER-]: Secondary | ICD-10-CM | POA: Insufficient documentation

## 2021-06-05 DIAGNOSIS — R200 Anesthesia of skin: Secondary | ICD-10-CM | POA: Insufficient documentation

## 2021-06-05 DIAGNOSIS — C50412 Malignant neoplasm of upper-outer quadrant of left female breast: Secondary | ICD-10-CM

## 2021-06-05 DIAGNOSIS — Z801 Family history of malignant neoplasm of trachea, bronchus and lung: Secondary | ICD-10-CM | POA: Insufficient documentation

## 2021-06-05 DIAGNOSIS — Z5112 Encounter for antineoplastic immunotherapy: Secondary | ICD-10-CM | POA: Insufficient documentation

## 2021-06-05 DIAGNOSIS — R5383 Other fatigue: Secondary | ICD-10-CM | POA: Insufficient documentation

## 2021-06-05 DIAGNOSIS — C778 Secondary and unspecified malignant neoplasm of lymph nodes of multiple regions: Secondary | ICD-10-CM | POA: Insufficient documentation

## 2021-06-05 DIAGNOSIS — Z809 Family history of malignant neoplasm, unspecified: Secondary | ICD-10-CM | POA: Diagnosis not present

## 2021-06-05 DIAGNOSIS — Z8249 Family history of ischemic heart disease and other diseases of the circulatory system: Secondary | ICD-10-CM | POA: Insufficient documentation

## 2021-06-05 DIAGNOSIS — Z87891 Personal history of nicotine dependence: Secondary | ICD-10-CM | POA: Insufficient documentation

## 2021-06-05 DIAGNOSIS — Z510 Encounter for antineoplastic radiation therapy: Secondary | ICD-10-CM | POA: Diagnosis present

## 2021-06-05 DIAGNOSIS — Z95828 Presence of other vascular implants and grafts: Secondary | ICD-10-CM

## 2021-06-05 LAB — CBC WITH DIFFERENTIAL/PLATELET
Abs Immature Granulocytes: 0.02 10*3/uL (ref 0.00–0.07)
Basophils Absolute: 0.1 10*3/uL (ref 0.0–0.1)
Basophils Relative: 1 %
Eosinophils Absolute: 0.3 10*3/uL (ref 0.0–0.5)
Eosinophils Relative: 5 %
HCT: 31 % — ABNORMAL LOW (ref 36.0–46.0)
Hemoglobin: 10.1 g/dL — ABNORMAL LOW (ref 12.0–15.0)
Immature Granulocytes: 0 %
Lymphocytes Relative: 13 %
Lymphs Abs: 0.8 10*3/uL (ref 0.7–4.0)
MCH: 30.6 pg (ref 26.0–34.0)
MCHC: 32.6 g/dL (ref 30.0–36.0)
MCV: 93.9 fL (ref 80.0–100.0)
Monocytes Absolute: 0.8 10*3/uL (ref 0.1–1.0)
Monocytes Relative: 14 %
Neutro Abs: 3.8 10*3/uL (ref 1.7–7.7)
Neutrophils Relative %: 67 %
Platelets: 196 10*3/uL (ref 150–400)
RBC: 3.3 MIL/uL — ABNORMAL LOW (ref 3.87–5.11)
RDW: 15.6 % — ABNORMAL HIGH (ref 11.5–15.5)
WBC: 5.8 10*3/uL (ref 4.0–10.5)
nRBC: 0 % (ref 0.0–0.2)

## 2021-06-05 LAB — COMPREHENSIVE METABOLIC PANEL
ALT: 13 U/L (ref 0–44)
AST: 20 U/L (ref 15–41)
Albumin: 3.6 g/dL (ref 3.5–5.0)
Alkaline Phosphatase: 72 U/L (ref 38–126)
Anion gap: 12 (ref 5–15)
BUN: 10 mg/dL (ref 6–20)
CO2: 27 mmol/L (ref 22–32)
Calcium: 9 mg/dL (ref 8.9–10.3)
Chloride: 104 mmol/L (ref 98–111)
Creatinine, Ser: 0.85 mg/dL (ref 0.44–1.00)
GFR, Estimated: >60 mL/min (ref >60)
Glucose, Bld: 116 mg/dL — ABNORMAL HIGH (ref 70–99)
Potassium: 3.4 mmol/L — ABNORMAL LOW (ref 3.5–5.1)
Sodium: 143 mmol/L (ref 135–145)
Total Bilirubin: 0.3 mg/dL (ref 0.3–1.2)
Total Protein: 7.2 g/dL (ref 6.5–8.1)

## 2021-06-05 LAB — TSH: TSH: 2.553 u[IU]/mL (ref 0.308–3.960)

## 2021-06-05 MED ORDER — HEPARIN SOD (PORK) LOCK FLUSH 100 UNIT/ML IV SOLN
500.0000 [IU] | Freq: Once | INTRAVENOUS | Status: AC | PRN
  Administered 2021-06-05: 500 [IU]

## 2021-06-05 MED ORDER — SODIUM CHLORIDE 0.9% FLUSH
10.0000 mL | Freq: Once | INTRAVENOUS | Status: AC
  Administered 2021-06-05: 10 mL

## 2021-06-05 MED ORDER — SODIUM CHLORIDE 0.9 % IV SOLN: Freq: Once | INTRAVENOUS | Status: AC

## 2021-06-05 MED ORDER — SODIUM CHLORIDE 0.9 % IV SOLN
200.0000 mg | Freq: Once | INTRAVENOUS | Status: AC
  Administered 2021-06-05: 200 mg via INTRAVENOUS
  Filled 2021-06-05: qty 8

## 2021-06-05 MED ORDER — SODIUM CHLORIDE 0.9% FLUSH
10.0000 mL | INTRAVENOUS | Status: DC | PRN
  Administered 2021-06-05: 10 mL

## 2021-06-05 NOTE — Patient Instructions (Signed)
[facility]  Discharge Instructions: Thank you for choosing [facility] to provide your oncology and hematology care.   If you have a lab appointment with the [facility], please go directly to the [facility] and check in at the registration area.   Wear comfortable clothing and clothing appropriate for easy access to any Portacath or PICC line.   We strive to give you quality time with your provider. You may need to reschedule your appointment if you arrive late (15 or more minutes).  Arriving late affects you and other patients whose appointments are after yours.  Also, if you miss three or more appointments without notifying the office, you may be dismissed from the clinic at the provider's discretion.      For prescription refill requests, have your pharmacy contact our office and allow 72 hours for refills to be completed.    Today you received the following chemotherapy and/or immunotherapy agents Beryle Flock      To help prevent nausea and vomiting after your treatment, we encourage you to take your nausea medication as directed.  BELOW ARE SYMPTOMS THAT SHOULD BE REPORTED IMMEDIATELY: *FEVER GREATER THAN 100.4 F (38 C) OR HIGHER *CHILLS OR SWEATING *NAUSEA AND VOMITING THAT IS NOT CONTROLLED WITH YOUR NAUSEA MEDICATION *UNUSUAL SHORTNESS OF BREATH *UNUSUAL BRUISING OR BLEEDING *URINARY PROBLEMS (pain or burning when urinating, or frequent urination) *BOWEL PROBLEMS (unusual diarrhea, constipation, pain near the anus) TENDERNESS IN MOUTH AND THROAT WITH OR WITHOUT PRESENCE OF ULCERS (sore throat, sores in mouth, or a toothache) UNUSUAL RASH, SWELLING OR PAIN  UNUSUAL VAGINAL DISCHARGE OR ITCHING   Items with * indicate a potential emergency and should be followed up as soon as possible or go to the Emergency Department if any problems should occur.  Please show the CHEMOTHERAPY ALERT CARD or IMMUNOTHERAPY ALERT CARD at check-in to  the Emergency Department and triage nurse.  Should you have questions after your visit or need to cancel or reschedule your appointment, please contact [facility]  Dept: [phone]  and follow the prompts.  Office hours are 8:00 a.m. to 4:30 p.m. Monday - Friday. Please note that voicemails left after 4:00 p.m. may not be returned until the following business day.  We are closed weekends and major holidays. You have access to a nurse at all times for urgent questions. Please call the main number to the clinic Dept: [phone] and follow the prompts.   For any non-urgent questions, you may also contact your provider using MyChart. We now offer e-Visits for anyone 69 and older to request care online for non-urgent symptoms. For details visit [email or web address].   Also download the MyChart app! Go to the app store, search "MyChart", open the app, select [facility], and log in with your MyChart username and password.  Due to Covid, a mask is required upon entering the hospital/clinic. If you do not have a mask, one will be given to you upon arrival. For doctor visits, patients may have 1 support person aged 40 or older with them. For treatment visits, patients cannot have anyone with them due to current Covid guidelines and our immunocompromised population.    Pembrolizumab injection What is this medication? PEMBROLIZUMAB (pem broe liz ue mab) is a monoclonal antibody. It is used to treat certain types of cancer. This medicine may be used for other purposes; ask your health care provider or pharmacist if you have questions. COMMON BRAND NAME(S): [employer]  What should I tell my care team before I take this medication? They need to know if you have any of these conditions: autoimmune diseases like Crohn's disease, ulcerative colitis, or lupus have had or planning to have an allogeneic stem cell transplant (uses someone else's stem cells) history of organ  transplant history of chest radiation nervous system problems like myasthenia gravis or Guillain-Barre syndrome an unusual or allergic reaction to pembrolizumab, other medicines, foods, dyes, or preservatives pregnant or trying to get pregnant breast-feeding How should I use this medication? This medicine is for infusion into a vein. It is given by a health care professional in a hospital or clinic setting. A special MedGuide will be given to you before each treatment. Be sure to read this information carefully each time. Talk to your pediatrician regarding the use of this medicine in children. While this drug may be prescribed for children as young as 6 months for selected conditions, precautions do apply. Overdosage: If you think you have taken too much of this medicine contact a poison control center or emergency room at once. NOTE: This medicine is only for you. Do not share this medicine with others. What if I miss a dose? It is important not to miss your dose. Call your doctor or health care professional if you are unable to keep an appointment. What may interact with this medication? Interactions have not been studied. This list may not describe all possible interactions. Give your health care provider a list of all the medicines, herbs, non-prescription drugs, or dietary supplements you use. Also tell them if you smoke, drink alcohol, or use illegal drugs. Some items may interact with your medicine. What should I watch for while using this medication? Your condition will be monitored carefully while you are receiving this medicine. You may need blood work done while you are taking this medicine. Do not become pregnant while taking this medicine or for 4 months after stopping it. Women should inform their doctor if they wish to become pregnant or think they might be pregnant. There is a potential for serious side effects to an unborn child. Talk to your health care professional or  pharmacist for more information. Do not breast-feed an infant while taking this medicine or for 4 months after the last dose. What side effects may I notice from receiving this medication? Side effects that you should report to your doctor or health care professional as soon as possible: allergic reactions like skin rash, itching or hives, swelling of the face, lips, or tongue bloody or black, tarry breathing problems changes in vision chest pain chills confusion constipation cough diarrhea dizziness or feeling faint or lightheaded fast or irregular heartbeat fever flushing joint pain low blood counts - this medicine may decrease the number of white blood cells, red blood cells and platelets. You may be at increased risk for infections and bleeding. muscle pain muscle weakness pain, tingling, numbness in the hands or feet persistent headache redness, blistering, peeling or loosening of the skin, including inside the mouth signs and symptoms of high blood sugar such as dizziness; dry mouth; dry skin; fruity breath; nausea; stomach pain; increased hunger or thirst; increased urination signs and symptoms of kidney injury like trouble passing urine or change in the amount of urine signs and symptoms of liver injury like dark urine, light-colored stools, loss of appetite, nausea, right upper belly pain, yellowing of the eyes or skin sweating swollen lymph nodes weight loss Side effects that usually do not require  medical attention (report to your doctor or health care professional if they continue or are bothersome): decreased appetite hair loss tiredness This list may not describe all possible side effects. Call your doctor for medical advice about side effects. You may report side effects to FDA at 1-800-FDA-1088. Where should I keep my medication? This drug is given in a hospital or clinic and will not be stored at home. NOTE: This sheet is a summary. It may not cover all possible  information. If you have questions about this medicine, talk to your doctor, pharmacist, or health care provider.  2022 Elsevier/Gold Standard (2021-03-03 00:00:00)

## 2021-06-05 NOTE — Assessment & Plan Note (Addendum)
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 Neoadjuvant chemotherapy with Adriamycin and Cytoxan dose dense 4 followed by Taxol weekly 12 with carboplatin every 3 weeks x4(pembrolizumab will be added if she is triple negative) 2. 05/14/2021:Left modified radical mastectomy: Metastatic carcinoma in 3/7 lymph nodes, no residual invasive cancer in the breast, no extranodal extension, ER 0%, PR 0%, HER2 negative, Ki-67 60% 3. Followed by adjuvant radiation therapy PET CT scan: Known breast cancer and lymphadenopathy in the axilla, supraclavicular, pectoral, internal mammary lymph nodes, no distant metastatic disease -------------------------------------------------------------------------- Pathology counseling: I discussed the final pathology report of the patient provided  a copy of this report. I discussed the margins as well as lymph node surgeries. We also discussed the final staging along with previously performed ER/PR and HER-2/neu testing.  Treatment plan: Continue with pembrolizumab maintenance therapy every 3 weeks, adjuvant radiation  Rita Cole is doing well today.  She will proceed with her adjuvant Keytruda which we will give her every 3 weeks.  She will also consult with radiation oncology to discuss adjuvant radiation next week on June 10, 2021.  We discussed that we will see her with every other pembrolizumab treatment.  We discussed common side effects of pembrolizumab and the role that lab testing plays in evaluation for adverse effects prior to her treatment.  I discussed healthy diet and exercise with her.  We reviewed the [place] study which showed survivors who  exercise 150 to 300 minutes a week at a lower rate of breast cancer recurrence.  She plans on getting into a walking regimen.  We will see her back in 3 weeks for labs and treatment.  In 6 weeks she will follow-up with Dr. Payton Mccallum and have her labs and treatment.  She verbalized understanding of the above and is in agreement with the treatment plan.

## 2021-06-05 NOTE — Progress Notes (Signed)
[facility] Follow up:    Rita Sheer, NP [place] [address]   DIAGNOSIS:  Cancer Staging  Malignant neoplasm of upper-outer quadrant of left breast in female, estrogen receptor negative ([place]) Staging form: Breast, AJCC 8th Edition - Clinical stage from 10/01/2020: Stage IIIC (cT4b, cN2a, cM0, G3, ER-, PR-, HER2: Equivocal) - Signed by Nicholas Lose, MD on 10/01/2020 Stage prefix: Initial diagnosis Histologic grading system: 3 grade system - Pathologic stage from 05/14/2021: No Stage Recommended (ypT0, pN1a, cM0) - Signed by Gardenia Phlegm, NP on 06/05/2021 Stage prefix: Post-therapy   SUMMARY OF ONCOLOGIC HISTORY: Oncology History  Malignant neoplasm of upper-outer quadrant of left breast in female, estrogen receptor negative ([place])  09/30/2020 Initial Diagnosis   Swollen left breast: Mammogram detected left breast mass UOQ skin thickening with 3 abnormal lymph nodes that are matted and hard, ultrasound 2.7 cm, 2.9 cm, 3 cm and 3 lymph nodes biopsy revealed grade 2-3 IDC with DCIS ER/PR negative HER-2 IHC equivocal FISH pending, Ki-67 60%, lymph node biopsy positive   10/01/2020 Cancer Staging   Staging form: Breast, AJCC 8th Edition - Clinical stage from 10/01/2020: Stage IIIC (cT4b, cN2a, cM0, G3, ER-, PR-, HER2: Equivocal) - Signed by Nicholas Lose, MD on 10/01/2020 Stage prefix: Initial diagnosis Histologic grading system: 3 grade system    10/13/2020 Genetic Testing   Negative hereditary cancer genetic testing: no pathogenic variants detected in [facility] CustomNext-Cancer +RNAinsight Panel.  The report date is October 13, 2020.   The CustomNext-Cancer+RNAinsight panel offered by [facility] includes sequencing and rearrangement analysis for the following 47 genes:  APC, ATM, AXIN2, BARD1, BMPR1A, BRCA1, BRCA2, BRIP1, CDH1, CDK4, CDKN2A, CHEK2, DICER1, EPCAM, GREM1, HOXB13, MEN1, MLH1, MSH2, MSH3, MSH6, MUTYH, NBN, NF1, NF2, NTHL1, PALB2,  PMS2, POLD1, POLE, PTEN, RAD51C, RAD51D, RECQL, RET, SDHA, SDHAF2, SDHB, SDHC, SDHD, SMAD4, SMARCA4, STK11, TP53, TSC1, TSC2, and VHL.  RNA data is routinely analyzed for use in variant interpretation for all genes.   01/13/2021 - 04/14/2021 Chemotherapy   Patient is on Treatment Plan : BREAST Pembrolizumab + AC q21d x 4 cycles / Pembrolizumab + Carboplatin D1 + Paclitaxel D1,8,15 q21d X 4 cycles     05/14/2021 Surgery   Left modified radical mastectomy: Metastatic carcinoma in 3/7 lymph nodes, no residual invasive cancer in the breast, no extranodal extension, ER 0%, PR 0%, HER2 negative, Ki-67 60%   05/14/2021 Cancer Staging   Staging form: Breast, AJCC 8th Edition - Pathologic stage from 05/14/2021: No Stage Recommended (ypT0, pN1a, cM0) - Signed by Gardenia Phlegm, NP on 06/05/2021 Stage prefix: Post-therapy    06/05/2021 -  Chemotherapy   Patient is on Treatment Plan : BREAST Pembrolizumab q21d       CURRENT THERAPY: Pembrolizumab  INTERVAL HISTORY: Rita Cole 60 y.o. female returns for follow-up and evaluation prior to receiving her next cycle of pembrolizumab.  She is doing quite well.  She notes that she saw physical therapy who checked her mastectomy site and noted that everything was healing well.  She does have some numbness under her left axilla as expected and she is adjusting the sensation without difficulty.  She denies any new issues.  She is not having any difficulty with managing pain.  She notes her activity level is getting back to normal.  She has an appointment with radiation oncology next week.   Patient Active Problem List   Diagnosis Date Noted   S/P mastectomy, left 05/14/2021  Port-A-Cath in place 11/10/2020   Genetic testing 10/13/2020   Family history of lung cancer 10/02/2020   Malignant neoplasm of upper-outer quadrant of left breast in female, estrogen receptor negative ([place]) 09/30/2020    has No Known Allergies.  MEDICAL  HISTORY: Past Medical History:  Diagnosis Date   Breast cancer ([place])    Family history of lung cancer 10/02/2020   Hypertension    Pre-diabetes     SURGICAL HISTORY: Past Surgical History:  Procedure Laterality Date   BREAST SURGERY     BUNIONECTOMY     IR IMAGING GUIDED PORT INSERTION  10/14/2020   MODIFIED MASTECTOMY Left 05/14/2021   Procedure: LEFT MODIFIED RADICAL MASTECTOMY;  Surgeon: Rolm Bookbinder, MD;  Location: [facility];  Service: General;  Laterality: Left;   MYOMECTOMY     TONSILLECTOMY     as a child    SOCIAL HISTORY: Social History   Socioeconomic History   Marital status: Divorced    Spouse name: Not on file   Number of children: Not on file   Years of education: Not on file   Highest education level: Not on file  Occupational History   Not on file  Tobacco Use   Smoking status: Former    Packs/day: 0.25    Types: Cigarettes   Smokeless tobacco: Never  Vaping Use   Vaping Use: Never used  Substance and Sexual Activity   Alcohol use: No   Drug use: No   Sexual activity: Yes  Other Topics Concern   Not on file  Social History Narrative   Not on file   Social Determinants of Health   Financial Resource Strain: Not on file  Food Insecurity: Not on file  Transportation Needs: Not on file  Physical Activity: Not on file  Stress: Not on file  Social Connections: Not on file  Intimate Partner Violence: Not on file    FAMILY HISTORY: Family History  Problem Relation Age of Onset   Heart attack Father    Lung cancer Father        dx unknown age   Cancer Other        MGM's mother; unknown type; dx unknown age    Review of Systems  Constitutional:  Positive for fatigue (Mild). Negative for appetite change, chills, fever and unexpected weight change.  HENT:   Negative for hearing loss, lump/mass and trouble swallowing.   Eyes:  Negative for eye problems and icterus.  Respiratory:  Negative for chest tightness, cough and shortness of breath.    Cardiovascular:  Negative for chest pain, leg swelling and palpitations.  Gastrointestinal:  Negative for abdominal distention, abdominal pain, constipation, diarrhea, nausea and vomiting.  Endocrine: Negative for hot flashes.  Genitourinary:  Negative for difficulty urinating.   Musculoskeletal:  Negative for arthralgias.  Skin:  Negative for itching and rash.  Neurological:  Negative for dizziness, extremity weakness, headaches and numbness.  Hematological:  Negative for adenopathy. Does not bruise/bleed easily.  Psychiatric/Behavioral:  Negative for depression. The patient is not nervous/anxious.      PHYSICAL EXAMINATION  ECOG PERFORMANCE STATUS: 1 - Symptomatic but completely ambulatory  Vitals:   06/05/21 0813  BP: (!) 145/88  Pulse: 73  Resp: 20  Temp: 97.9 F (36.6 C)  SpO2: 100%    Physical Exam Constitutional:      General: She is not in acute distress.    Appearance: Normal appearance. She is not toxic-appearing.  HENT:     Head: Normocephalic and atraumatic.  Eyes:     General: No scleral icterus. Cardiovascular:     Rate and Rhythm: Normal rate and regular rhythm.     Pulses: Normal pulses.     Heart sounds: Normal heart sounds.  Pulmonary:     Effort: Pulmonary effort is normal.     Breath sounds: Normal breath sounds.  Abdominal:     General: Abdomen is flat. Bowel sounds are normal. There is no distension.     Palpations: Abdomen is soft.     Tenderness: There is no abdominal tenderness.  Musculoskeletal:        General: No swelling.     Cervical back: Neck supple.  Lymphadenopathy:     Cervical: No cervical adenopathy.  Skin:    General: Skin is warm and dry.     Findings: No rash.  Neurological:     General: No focal deficit present.     Mental Status: She is alert.  Psychiatric:        Mood and Affect: Mood normal.        Behavior: Behavior normal.    LABORATORY DATA:  CBC    Component Value Date/Time   WBC 5.8 06/05/2021 0757    RBC 3.30 (L) 06/05/2021 0757   HGB 10.1 (L) 06/05/2021 0757   HGB 10.1 (L) 04/14/2021 0810   HCT 31.0 (L) 06/05/2021 0757   PLT 196 06/05/2021 0757   PLT 195 04/14/2021 0810   MCV 93.9 06/05/2021 0757   MCH 30.6 06/05/2021 0757   MCHC 32.6 06/05/2021 0757   RDW 15.6 (H) 06/05/2021 0757   LYMPHSABS 0.8 06/05/2021 0757   MONOABS 0.8 06/05/2021 0757   EOSABS 0.3 06/05/2021 0757   BASOSABS 0.1 06/05/2021 0757    CMP     Component Value Date/Time   NA 141 05/11/2021 0935   K 3.3 (L) 05/11/2021 0935   CL 101 05/11/2021 0935   CO2 28 05/11/2021 0935   GLUCOSE 115 (H) 05/11/2021 0935   BUN 20 05/11/2021 0935   CREATININE 1.10 (H) 05/11/2021 0935   CREATININE 1.05 (H) 04/14/2021 0810   CALCIUM 9.4 05/11/2021 0935   PROT 7.7 04/14/2021 0810   ALBUMIN 4.0 04/14/2021 0810   AST 22 04/14/2021 0810   ALT 21 04/14/2021 0810   ALKPHOS 63 04/14/2021 0810   BILITOT 0.5 04/14/2021 0810   GFRNONAA 58 (L) 05/11/2021 0935   GFRNONAA >60 04/14/2021 0810        ASSESSMENT and THERAPY PLAN:   Malignant neoplasm of upper-outer quadrant of left breast in female, estrogen receptor negative ([place]) 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 Neoadjuvant chemotherapy with Adriamycin and Cytoxan dose dense 4 followed by Taxol weekly 12 with carboplatin every 3 weeks x4 (pembrolizumab will be added if she is triple negative) 2. 05/14/2021:Left modified radical mastectomy: Metastatic carcinoma in 3/7 lymph nodes, no residual invasive cancer in the breast, no extranodal extension, ER 0%, PR 0%, HER2 negative, Ki-67 60% 3. Followed by adjuvant radiation therapy PET CT scan: Known breast cancer and  lymphadenopathy in the axilla, supraclavicular, pectoral, internal mammary lymph nodes, no distant metastatic disease -------------------------------------------------------------------------- Pathology counseling: I discussed the final pathology report of the patient provided  a  copy of this report. I discussed the margins as well as lymph node surgeries. We also discussed the final staging along with previously performed ER/PR and HER-2/neu testing.  Treatment plan: Continue with pembrolizumab maintenance therapy every 3 weeks, adjuvant radiation  Desaree is doing well today.  She will proceed with her adjuvant Keytruda which we will give her every 3 weeks.  She will also consult with radiation oncology to discuss adjuvant radiation next week on June 10, 2021.  We discussed that we will see her with every other pembrolizumab treatment.  We discussed common side effects of pembrolizumab and the role that lab testing plays in evaluation for adverse effects prior to her treatment.  I discussed healthy diet and exercise with her.  We reviewed the [place] study which showed survivors who exercise 150 to 300 minutes a week at a lower rate of breast cancer recurrence.  She plans on getting into a walking regimen.  We will see her back in 3 weeks for labs and treatment.  In 6 weeks she will follow-up with Dr. Payton Mccallum and have her labs and treatment.  She verbalized understanding of the above and is in agreement with the treatment plan.  She knows to call for any questions that may arise between now and her next appointment.  We are happy to see her sooner if needed.  Total encounter time: 30 minutes in face-to-face visit time, chart review, lab review, care coordination, and documentation of the encounter.  Wilber Bihari, NP 06/05/21 8:40 AM Medical Oncology and Hematology [facility] [place], [place] [address] Tel. [phone]    Fax.  [phone]  *Total Encounter Time as defined by the Centers for Medicare and Medicaid Services includes, in addition to the face-to-face time of a patient visit (documented in the note above) non-face-to-face time: obtaining and reviewing outside history, ordering and reviewing medications, tests or procedures, care coordination (communications with other health care professionals or caregivers) and documentation in the medical record.

## 2021-06-06 LAB — T4: T4, Total: 9.6 ug/dL (ref 4.5–12.0)

## 2021-06-09 ENCOUNTER — Telehealth: Payer: Self-pay

## 2021-06-09 NOTE — Telephone Encounter (Signed)
Left message for patient w/ a friendly reminder of her 9:00am-06/10/21  IN-PERSON CONSULT appointment w/ Shona Simpson PA-C. I advised patient to arrive at 8:30/45am for check-in. I left my extension [phone] for any questions.

## 2021-06-10 ENCOUNTER — Ambulatory Visit
Admission: RE | Admit: 2021-06-10 | Discharge: 2021-06-10 | Disposition: A | Discharge status: Home / Self Care | Payer: Federal, State, Local not specified - PPO | Source: Ambulatory Visit | Attending: Radiation Oncology | Admitting: Radiation Oncology

## 2021-06-10 ENCOUNTER — Other Ambulatory Visit: Payer: Self-pay

## 2021-06-10 ENCOUNTER — Encounter: Payer: Self-pay | Admitting: Radiation Oncology

## 2021-06-10 VITALS — BP 134/63 | HR 80 | Temp 97.6°F | Resp 20 | Ht 65.0 in | Wt 205.0 lb

## 2021-06-10 DIAGNOSIS — Z171 Estrogen receptor negative status [ER-]: Secondary | ICD-10-CM

## 2021-06-10 DIAGNOSIS — C50412 Malignant neoplasm of upper-outer quadrant of left female breast: Secondary | ICD-10-CM

## 2021-06-10 DIAGNOSIS — Z5112 Encounter for antineoplastic immunotherapy: Secondary | ICD-10-CM | POA: Diagnosis not present

## 2021-06-10 NOTE — Progress Notes (Signed)
Patient reports LT breast skin tightness/ tenderness 5/10. Also reports some ABD pain, which is being followed by Gertie Fey Dr. Eber Jones at [facility] Next appointment roughly 1 wk. No other symptoms reported at this time.  Postmenopausal- No chances of pregnancy.  BP 134/63 (BP Location: Right Arm, Patient Position: Sitting, Cuff Size: Large)    Pulse 80    Temp 97.6 F (36.4 C)    Resp 20    Ht 5\' 5"  (1.651 m)    Wt 205 lb (93 kg)    SpO2 99%    BMI 34.11 kg/m

## 2021-06-10 NOTE — Progress Notes (Signed)
Radiation Oncology         [phone]) [phone] ________________________________  Name: Rita Cole        MRN: [id number]  Date of Service: 06/10/2021 DOB: 14-Jan-1961  DV:VOHYWV, Nena Alexander, NP  Nicholas Lose, MD     REFERRING PHYSICIAN: Nicholas Lose, MD   DIAGNOSIS: The encounter diagnosis was Malignant neoplasm of upper-outer quadrant of left breast in female, estrogen receptor negative ([place]).   HISTORY OF PRESENT ILLNESS: Rita Cole is a 59 y.o. female originally seen in the multidisciplinary breast clinic for a new diagnosis of left breast cancer.  The patient was found to have distortion as well as adenopathy within the left breast, she had further diagnostic imaging that showed a 5 cm asymmetry as well as 3 abnormal appearing lymph nodes.  At the time of her ultrasound the radiologist noted hard red nodding skin as well as firm fixed lymph nodes in the left axilla.  She had 3 separate masses measuring 2.7 cm, 2.9 cm, and 3 cm each.  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  Her tumor was ER/PR negative, HER-2 is equivocal and pending FISH this morning, her Ki-67 was 60%.    Since her last visit, the patient went on to proceed with neoadjuvant chemotherapy between 10/20/2020 and 04/14/2021.  Posttreatment MRI of the breasts on 04/15/2021 showed marked interval decrease in the size of the enhancing mass in the upper outer left breast now measuring 2.9 cm in greatest dimension, near complete resolution of the additional non-mass enhancement throughout the remainder of the breast was also identified.  An interval decrease in the size of multiple left axillary lymph nodes were noted.  An interval decrease in the right axillary lymph nodes were also normal in size.  She then underwent left modified  radical mastectomy on 05/14/2021 and final pathology reveals no evidence of residual invasive or in situ carcinoma within the breast specimen, 3 of the 7 sampled lymph nodes contained metastatic disease and the largest measured 2.5 mm.  Post treatment changes were noted including hyaline fibrosis and fibrocystic change.  Given these findings she is seen to discuss adjuvant radiotherapy to the left chest wall and regional lymph nodes.   PREVIOUS RADIATION THERAPY: No   PAST MEDICAL HISTORY:  Past Medical History:  Diagnosis Date   Breast cancer ([place])    Family history of lung cancer 10/02/2020   Hypertension    Pre-diabetes        PAST SURGICAL HISTORY: Past Surgical History:  Procedure Laterality Date   BREAST SURGERY     BUNIONECTOMY     IR IMAGING GUIDED PORT INSERTION  10/14/2020   MODIFIED MASTECTOMY Left 05/14/2021   Procedure: LEFT MODIFIED RADICAL MASTECTOMY;  Surgeon: Rolm Bookbinder, MD;  Location: [facility];  Service: General;  Laterality: Left;   MYOMECTOMY     TONSILLECTOMY     as a child     FAMILY HISTORY:  Family History  Problem Relation Age of Onset   Heart attack Father    Lung cancer Father        dx unknown age   Cancer Other        MGM's mother; unknown type; dx unknown age     SOCIAL HISTORY:  reports that she has quit smoking. Her smoking use included cigarettes. She  smoked an average of .25 packs per day. She has never used smokeless tobacco. She reports that she does not drink alcohol and does not use drugs.  The patient is divorced and lives in [place].     ALLERGIES: Patient has no known allergies.   MEDICATIONS:  Current Outpatient Medications  Medication Sig Dispense Refill   amLODipine (NORVASC) 10 MG tablet Take 1 tablet (10 mg total) by mouth daily.     calcium carbonate (TUMS - DOSED IN MG ELEMENTAL CALCIUM) 500 MG chewable tablet Chew 1 tablet by mouth daily as needed for indigestion or heartburn.     cyclobenzaprine (FLEXERIL) 10  MG tablet Take 10 mg by mouth 3 (three) times daily as needed for muscle spasms.     ferrous sulfate 325 (65 FE) MG tablet Take 325 mg by mouth daily with breakfast.     hydrochlorothiazide (HYDRODIURIL) 25 MG tablet Take 1 tablet (25 mg total) by mouth daily. (Patient not taking: No sig reported) 30 tablet 0   hydroxypropyl methylcellulose / hypromellose (ISOPTO TEARS / GONIOVISC) 2.5 % ophthalmic solution Place 1 drop into both eyes 3 (three) times daily as needed for dry eyes.     ketorolac (TORADOL) 10 MG tablet Take 1 tablet (10 mg total) by mouth every 6 (six) hours as needed. (Patient not taking: No sig reported) 20 tablet 0   losartan (COZAAR) 100 MG tablet Take 100 mg by mouth daily.     meloxicam (MOBIC) 15 MG tablet Take 1 tablet (15 mg total) by mouth daily. (Patient not taking: No sig reported) 10 tablet 0   methocarbamol (ROBAXIN) 750 MG tablet Take 1 tablet (750 mg total) by mouth every 8 (eight) hours as needed (use for muscle cramps/pain). 30 tablet 0   metoprolol succinate (TOPROL-XL) 50 MG 24 hr tablet Take 50 mg by mouth daily.     pyridoxine (B-6) 100 MG tablet Take 100 mg by mouth daily.     triamterene-hydrochlorothiazide (MAXZIDE-25) 37.5-25 MG tablet Take 1 tablet by mouth daily.     vitamin B-12 (CYANOCOBALAMIN) 500 MCG tablet Take 500 mcg by mouth daily.     Vitamin D, Ergocalciferol, (DRISDOL) 1.25 MG (50000 UNIT) CAPS capsule Take 50,000 Units by mouth once a week. Monday     No current facility-administered medications for this encounter.     REVIEW OF SYSTEMS: On review of systems, the patient reports that she is doing pretty well recovering from chemotherapy but still has some numbness and tingling in her fingertips and toes. Her hair is coming in nicely, and she feels that she has had some nerve pain in her underarm and inner upper arm since surgery. She is having increased diarrhea and in the last week has seen a small amount of rectal bleeding. She suspects her  Crohn's Disease is flaring up again since chemotherapy. She plans to follow up with her GI provider today. No other complaints are noted.     PHYSICAL EXAM:  Wt Readings from Last 3 Encounters:  06/05/21 208 lb 9.6 oz (94.6 kg)  05/27/21 210 lb 14.4 oz (95.7 kg)  05/14/21 210 lb (95.3 kg)   Temp Readings from Last 3 Encounters:  06/05/21 97.9 F (36.6 C) (Temporal)  05/27/21 97.8 F (36.6 C) (Temporal)  05/15/21 97.8 F (36.6 C) (Oral)   BP Readings from Last 3 Encounters:  06/05/21 (!) 145/88  05/27/21 (!) 154/87  05/15/21 127/60   Pulse Readings from Last 3 Encounters:  06/05/21 73  05/27/21 96  05/15/21 68    In general this is a well appearing African-American female in no acute distress. She's alert and oriented x4 and appropriate throughout the examination. Cardiopulmonary assessment is negative for acute distress and she exhibits normal effort. Her left chest wall incision is well healed without erythema, separation or drainage.     ECOG = 1  0 - Asymptomatic (Fully active, able to carry on all predisease activities without restriction)  1 - Symptomatic but completely ambulatory (Restricted in physically strenuous activity but ambulatory and able to carry out work of a light or sedentary nature. For example, light housework, office work)  2 - Symptomatic, <50% in bed during the day (Ambulatory and capable of all self care but unable to carry out any work activities. Up and about more than 50% of waking hours)  3 - Symptomatic, >50% in bed, but not bedbound (Capable of only limited self-care, confined to bed or chair 50% or more of waking hours)  4 - Bedbound (Completely disabled. Cannot carry on any self-care. Totally confined to bed or chair)  5 - Death   Eustace Pen MM, Creech RH, Tormey DC, et al. [phone]). "Toxicity and response criteria of the [facility]". [place] Oncol. 5 (6): 649-55    LABORATORY DATA:  Lab Results  Component Value  Date   WBC 5.8 06/05/2021   HGB 10.1 (L) 06/05/2021   HCT 31.0 (L) 06/05/2021   MCV 93.9 06/05/2021   PLT 196 06/05/2021   Lab Results  Component Value Date   NA 143 06/05/2021   K 3.4 (L) 06/05/2021   CL 104 06/05/2021   CO2 27 06/05/2021   Lab Results  Component Value Date   ALT 13 06/05/2021   AST 20 06/05/2021   ALKPHOS 72 06/05/2021   BILITOT 0.3 06/05/2021      RADIOGRAPHY: No results found.      IMPRESSION/PLAN: 1. 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 Dr. Lisbeth Renshaw discusses the pathology findings and reviews the nature of node positive breast disease. She is finished with neoadjuvant chemotherapy and is well healed to proceed with external radiotherapy to the chest wall and regional lymph nodes to reduce risks of local recurrence followed by antiestrogen therapy. We discussed the risks, benefits, short, and long term effects of radiotherapy, as well as the curative intent, and the patient is interested in proceeding. Dr. Lisbeth Renshaw discusses the delivery and logistics of radiotherapy and recommends 6 1/2 weeks of radiotherapy to the left chest wall and regional nodes. Written consent is obtained and placed in the chart, a copy was provided to the patient. She will simulate today. 2. Crohn's Disease. I encouraged her to try to get an appointment this week with her GI providers. We will follow this expectantly.   In a visit lasting 45 minutes, greater than 50% of the time was spent face to face reviewing her case, as well as in preparation of, discussing, and coordinating the patient's care.  The above documentation reflects my direct findings during this shared patient visit. Please see the separate note by Dr. Lisbeth Renshaw on this date for the remainder of the patient's plan of care.    Carola Rhine, [facility]    **Disclaimer: This note was dictated with voice recognition software. Similar sounding words can  inadvertently be transcribed and this note may contain transcription errors which may not have been corrected upon publication of note.**

## 2021-06-11 ENCOUNTER — Encounter: Payer: Self-pay | Admitting: Hematology and Oncology

## 2021-06-11 NOTE — Progress Notes (Signed)
Completed the re-enrollment application  for [id number] w/ the [facility] for [place], got hers and Dr. Geralyn Flash signature and faxed to [employer] for processing.  I will notify the pt of the outcome once I receive it.

## 2021-06-14 ENCOUNTER — Other Ambulatory Visit: Payer: Self-pay

## 2021-06-15 ENCOUNTER — Encounter: Payer: Self-pay | Admitting: *Deleted

## 2021-06-17 ENCOUNTER — Ambulatory Visit: Payer: Federal, State, Local not specified - PPO | Admitting: Radiation Oncology

## 2021-06-18 ENCOUNTER — Ambulatory Visit: Payer: Federal, State, Local not specified - PPO

## 2021-06-19 ENCOUNTER — Ambulatory Visit: Payer: Federal, State, Local not specified - PPO

## 2021-06-22 DIAGNOSIS — Z5112 Encounter for antineoplastic immunotherapy: Secondary | ICD-10-CM | POA: Diagnosis not present

## 2021-06-23 ENCOUNTER — Ambulatory Visit
Admission: RE | Admit: 2021-06-23 | Discharge: 2021-06-23 | Disposition: A | Discharge status: Home / Self Care | Payer: Federal, State, Local not specified - PPO | Source: Ambulatory Visit | Attending: Radiation Oncology | Admitting: Radiation Oncology

## 2021-06-23 ENCOUNTER — Other Ambulatory Visit: Payer: Self-pay

## 2021-06-23 DIAGNOSIS — Z5112 Encounter for antineoplastic immunotherapy: Secondary | ICD-10-CM | POA: Diagnosis not present

## 2021-06-24 ENCOUNTER — Ambulatory Visit
Admission: RE | Admit: 2021-06-24 | Discharge: 2021-06-24 | Disposition: A | Discharge status: Home / Self Care | Payer: Federal, State, Local not specified - PPO | Source: Ambulatory Visit | Attending: Radiation Oncology | Admitting: Radiation Oncology

## 2021-06-24 DIAGNOSIS — Z5112 Encounter for antineoplastic immunotherapy: Secondary | ICD-10-CM | POA: Diagnosis not present

## 2021-06-25 ENCOUNTER — Other Ambulatory Visit: Payer: Self-pay

## 2021-06-25 ENCOUNTER — Ambulatory Visit
Admission: RE | Admit: 2021-06-25 | Discharge: 2021-06-25 | Disposition: A | Discharge status: Home / Self Care | Payer: Federal, State, Local not specified - PPO | Source: Ambulatory Visit | Attending: Radiation Oncology | Admitting: Radiation Oncology

## 2021-06-25 DIAGNOSIS — Z5112 Encounter for antineoplastic immunotherapy: Secondary | ICD-10-CM | POA: Diagnosis not present

## 2021-06-26 ENCOUNTER — Ambulatory Visit
Admission: RE | Admit: 2021-06-26 | Discharge: 2021-06-26 | Disposition: A | Discharge status: Home / Self Care | Payer: Federal, State, Local not specified - PPO | Source: Ambulatory Visit | Attending: Radiation Oncology | Admitting: Radiation Oncology

## 2021-06-26 ENCOUNTER — Inpatient Hospital Stay: Payer: Federal, State, Local not specified - PPO

## 2021-06-26 VITALS — BP 109/66 | HR 56 | Temp 98.5°F | Resp 20 | Wt 205.0 lb

## 2021-06-26 DIAGNOSIS — C50412 Malignant neoplasm of upper-outer quadrant of left female breast: Secondary | ICD-10-CM

## 2021-06-26 DIAGNOSIS — Z95828 Presence of other vascular implants and grafts: Secondary | ICD-10-CM

## 2021-06-26 DIAGNOSIS — Z171 Estrogen receptor negative status [ER-]: Secondary | ICD-10-CM

## 2021-06-26 DIAGNOSIS — Z5112 Encounter for antineoplastic immunotherapy: Secondary | ICD-10-CM | POA: Diagnosis not present

## 2021-06-26 LAB — COMPREHENSIVE METABOLIC PANEL
ALT: 13 U/L (ref 0–44)
AST: 18 U/L (ref 15–41)
Albumin: 4 g/dL (ref 3.5–5.0)
Alkaline Phosphatase: 60 U/L (ref 38–126)
Anion gap: 7 (ref 5–15)
BUN: 18 mg/dL (ref 6–20)
CO2: 31 mmol/L (ref 22–32)
Calcium: 9.9 mg/dL (ref 8.9–10.3)
Chloride: 102 mmol/L (ref 98–111)
Creatinine, Ser: 1.07 mg/dL — ABNORMAL HIGH (ref 0.44–1.00)
GFR, Estimated: 60 mL/min — ABNORMAL LOW (ref >60)
Glucose, Bld: 108 mg/dL — ABNORMAL HIGH (ref 70–99)
Potassium: 3.3 mmol/L — ABNORMAL LOW (ref 3.5–5.1)
Sodium: 140 mmol/L (ref 135–145)
Total Bilirubin: 0.4 mg/dL (ref 0.3–1.2)
Total Protein: 7.4 g/dL (ref 6.5–8.1)

## 2021-06-26 LAB — CBC WITH DIFFERENTIAL/PLATELET
Abs Immature Granulocytes: 0.01 10*3/uL (ref 0.00–0.07)
Basophils Absolute: 0.1 10*3/uL (ref 0.0–0.1)
Basophils Relative: 1 %
Eosinophils Absolute: 0.3 10*3/uL (ref 0.0–0.5)
Eosinophils Relative: 6 %
HCT: 33.7 % — ABNORMAL LOW (ref 36.0–46.0)
Hemoglobin: 10.9 g/dL — ABNORMAL LOW (ref 12.0–15.0)
Immature Granulocytes: 0 %
Lymphocytes Relative: 15 %
Lymphs Abs: 0.8 10*3/uL (ref 0.7–4.0)
MCH: 29.4 pg (ref 26.0–34.0)
MCHC: 32.3 g/dL (ref 30.0–36.0)
MCV: 90.8 fL (ref 80.0–100.0)
Monocytes Absolute: 0.9 10*3/uL (ref 0.1–1.0)
Monocytes Relative: 17 %
Neutro Abs: 3.2 10*3/uL (ref 1.7–7.7)
Neutrophils Relative %: 61 %
Platelets: 212 10*3/uL (ref 150–400)
RBC: 3.71 MIL/uL — ABNORMAL LOW (ref 3.87–5.11)
RDW: 15.3 % (ref 11.5–15.5)
WBC: 5.3 10*3/uL (ref 4.0–10.5)
nRBC: 0 % (ref 0.0–0.2)

## 2021-06-26 LAB — TSH: TSH: 3.174 u[IU]/mL (ref 0.308–3.960)

## 2021-06-26 MED ORDER — SODIUM CHLORIDE 0.9% FLUSH
10.0000 mL | INTRAVENOUS | Status: DC | PRN
  Administered 2021-06-26: 10:00:00 10 mL

## 2021-06-26 MED ORDER — RADIAPLEXRX EX GEL: Freq: Once | CUTANEOUS | Status: AC

## 2021-06-26 MED ORDER — HEPARIN SOD (PORK) LOCK FLUSH 100 UNIT/ML IV SOLN
500.0000 [IU] | Freq: Once | INTRAVENOUS | Status: AC | PRN
  Administered 2021-06-26: 10:00:00 500 [IU]

## 2021-06-26 MED ORDER — SODIUM CHLORIDE 0.9% FLUSH
10.0000 mL | Freq: Once | INTRAVENOUS | Status: AC
  Administered 2021-06-26: 08:00:00 10 mL

## 2021-06-26 MED ORDER — SODIUM CHLORIDE 0.9 % IV SOLN: Freq: Once | INTRAVENOUS | Status: AC

## 2021-06-26 MED ORDER — ALRA NON-METALLIC DEODORANT (RAD-ONC)
1.0000 "application " | Freq: Once | TOPICAL | Status: AC
  Administered 2021-06-26: 1 via TOPICAL

## 2021-06-26 MED ORDER — SODIUM CHLORIDE 0.9 % IV SOLN
200.0000 mg | Freq: Once | INTRAVENOUS | Status: AC
  Administered 2021-06-26: 09:00:00 200 mg via INTRAVENOUS
  Filled 2021-06-26: qty 8

## 2021-06-26 NOTE — Patient Instructions (Addendum)
[facility]  Discharge Instructions: Thank you for choosing [facility] to provide your oncology and hematology care.   If you have a lab appointment with the [facility], please go directly to the [facility] and check in at the registration area.   Wear comfortable clothing and clothing appropriate for easy access to any Portacath or PICC line.   We strive to give you quality time with your provider. You may need to reschedule your appointment if you arrive late (15 or more minutes).  Arriving late affects you and other patients whose appointments are after yours.  Also, if you miss three or more appointments without notifying the office, you may be dismissed from the clinic at the providers discretion.      For prescription refill requests, have your pharmacy contact our office and allow 72 hours for refills to be completed.    Today you received the following chemotherapy and/or immunotherapy agents: Pembrolizumab (Keytruda).       To help prevent nausea and vomiting after your treatment, we encourage you to take your nausea medication as directed.  BELOW ARE SYMPTOMS THAT SHOULD BE REPORTED IMMEDIATELY: *FEVER GREATER THAN 100.4 F (38 C) OR HIGHER *CHILLS OR SWEATING *NAUSEA AND VOMITING THAT IS NOT CONTROLLED WITH YOUR NAUSEA MEDICATION *UNUSUAL SHORTNESS OF BREATH *UNUSUAL BRUISING OR BLEEDING *URINARY PROBLEMS (pain or burning when urinating, or frequent urination) *BOWEL PROBLEMS (unusual diarrhea, constipation, pain near the anus) TENDERNESS IN MOUTH AND THROAT WITH OR WITHOUT PRESENCE OF ULCERS (sore throat, sores in mouth, or a toothache) UNUSUAL RASH, SWELLING OR PAIN  UNUSUAL VAGINAL DISCHARGE OR ITCHING   Items with * indicate a potential emergency and should be followed up as soon as possible or go to the Emergency Department if any problems should occur.  Please show the CHEMOTHERAPY ALERT CARD or IMMUNOTHERAPY ALERT  CARD at check-in to the Emergency Department and triage nurse.  Should you have questions after your visit or need to cancel or reschedule your appointment, please contact [facility]  Dept: [phone]  and follow the prompts.  Office hours are 8:00 a.m. to 4:30 p.m. Monday - Friday. Please note that voicemails left after 4:00 p.m. may not be returned until the following business day.  We are closed weekends and major holidays. You have access to a nurse at all times for urgent questions. Please call the main number to the clinic Dept: [phone] and follow the prompts.   For any non-urgent questions, you may also contact your provider using MyChart. We now offer e-Visits for anyone 44 and older to request care online for non-urgent symptoms. For details visit [email or web address].   Also download the MyChart app! Go to the app store, search "MyChart", open the app, select [facility], and log in with your MyChart username and password.  Due to Covid, a mask is required upon entering the hospital/clinic. If you do not have a mask, one will be given to you upon arrival. For doctor visits, patients may have 1 support person aged 68 or older with them. For treatment visits, patients cannot have anyone with them due to current Covid guidelines and our immunocompromised population.  Potassium Content of Foods Potassium is a mineral found in many foods and drinks. It affects how the heart works, and helps keep fluids and minerals balanced in the body. The amount of potassium you need each day depends on your age and any medical conditions you may  have. Talk to your health care provider or dietitian about how much potassium you need. The following lists of foods provide the general serving size for foods and the approximate amount of potassium in each serving, listed in milligrams (mg). Actual values may vary depending on the product and how it is processed. High in  potassium The following foods and beverages have 200 mg or more of potassium per serving: Apricots (raw) - 2 have 200 mg of potassium. Apricots (dry) - 5 have 200 mg of potassium. Artichoke - 1 medium has 345 mg of potassium. Avocado -  fruit has 245 mg of potassium. Banana - 1 medium fruit has 425 mg of potassium. Meadowood or baked beans (canned) -  cup has 280 mg of potassium. White beans (canned) -  cup has 595 mg potassium. Beef roast - 3 oz has 320 mg of potassium. Ground beef - 3 oz has 270 mg of potassium. Beets (raw or cooked) -  cup has 260 mg of potassium. Bran muffin - 2 oz has 300 mg of potassium. Broccoli (cooked) -  cup has 230 mg of potassium. Brussels sprouts -  cup has 250 mg of potassium. Cantaloupe -  cup has 215 mg of potassium. Cereal, 100% bran -  cup has 200-400 mg of potassium. Cheeseburger -1 single fast food burger has 225-400 mg of potassium. Chicken - 3 oz has 220 mg of potassium. Clams (canned) - 3 oz has 535 mg of potassium. Crab - 3 oz has 225 mg of potassium. Dates - 5 have 270 mg of potassium. Dried beans and peas -  cup has 300-475 mg of potassium. Figs (dried) - 2 have 260 mg of potassium. Fish (halibut, tuna, cod, snapper) - 3 oz has 480 mg of potassium. Fish (salmon, haddock, swordfish, perch) - 3 oz has 300 mg of potassium. Fish (tuna, canned) - 3 oz has 200 mg of potassium. [place] fries (fast food) - 3 oz has 470 mg of potassium. Granola with fruit and nuts -  cup has 200 mg of potassium. Grapefruit juice -  cup has 200 mg of potassium. Honeydew melon -  cup has 200 mg of potassium. Kale (raw) - 1 cup has 300 mg of potassium. Kiwi - 1 medium fruit has 240 mg of potassium. Kohlrabi, rutabaga, parsnips -  cup has 280 mg of potassium. Lentils -  cup has 365 mg of potassium. Mango - 1 each has 325 mg of potassium. Milk (nonfat, low-fat, whole, buttermilk) - 1 cup has 350-380 mg of potassium. Milk (chocolate) - 1 cup has 420 mg of  potassium Molasses - 1 Tbsp has 295 mg of potassium. Mushrooms -  cup has 280 mg of potassium. Nectarine - 1 each has 275 mg of potassium. Nuts (almonds, peanuts, hazelnuts, [place], cashew, mixed) - 1 oz has 200 mg of potassium. Nuts (pistachios) - 1 oz has 295 mg of potassium. Orange - 1 fruit has 240 mg of potassium. Orange juice -  cup has 235 mg of potassium. Papaya -  medium fruit has 390 mg of potassium. Peanut butter (chunky) - 2 Tbsp has 240 mg of potassium. Peanut butter (smooth) - 2 Tbsp has 210 mg of potassium. Pear - 1 medium (200 mg) of potassium. Pomegranate - 1 whole fruit has 400 mg of potassium. Pomegranate juice -  cup has 215 mg of potassium. Pork - 3 oz has 350 mg of potassium. Potato chips (salted) - 1 oz has 465 mg of potassium. Potato (baked with skin) -  1 medium has 925 mg of potassium. Potato (boiled) -  cup has 255 mg of potassium. Potato (Mashed) -  cup has 330 mg of potassium. Prune juice -  cup has 370 mg of potassium. Prunes - 5 have 305 mg of potassium. Pudding (chocolate) -  cup has 230 mg of potassium. Pumpkin (canned) -  cup has 250 mg of potassium. Raisins (seedless) -  cup has 270 mg of potassium. Seeds (sunflower or pumpkin) - 1 oz has 240 mg of potassium. Soy milk - 1 cup has 300 mg of potassium. Spinach (cooked) - 1/2 cup has 420 mg of potassium. Spinach (canned) -  cup has 370 mg of potassium. Sweet potato (baked with skin) - 1 medium has 450 mg of potassium. Swiss chard -  cup has 480 mg of potassium. Tomato or vegetable juice -  cup has 275 mg of potassium. Tomato (sauce or puree) -  cup has 400-550 mg of potassium. Tomato (raw) - 1 medium has 290 mg of potassium. Tomato (canned) -  cup has 200-300 mg of potassium. [place] - 3 oz has 250 mg of potassium. Wheat germ - 1 oz has 250 mg of potassium. Winter squash -  cup has 250 mg of potassium. Yogurt (plain or fruited) - 6 oz has 260-435 mg of potassium. Zucchini -  cup has  220 mg of potassium. Moderate in potassium The following foods and beverages have 50-200 mg of potassium per serving: Apple - 1 fruit has 150 mg of potassium Apple juice -  cup has 150 mg of potassium Applesauce -  cup has 90 mg of potassium Apricot nectar -  cup has 140 mg of potassium Asparagus (small spears) -  cup has 155 mg of potassium Asparagus (large spears) - 6 have 155 mg of potassium Bagel (cinnamon raisin) - 1 four-inch bagel has 130 mg of potassium Bagel (egg or plain) - 1 four- inch bagel has 70 mg of potassium Beans (green) -  cup has 90 mg of potassium Beans (yellow) -  cup has 190 mg of potassium Beer, regular - 12 oz has 100 mg of potassium Beets (canned) -  cup has 125 mg of potassium Blackberries -  cup has 115 mg of potassium Blueberries -  cup has 60 mg of potassium Bread (whole wheat) - 1 slice has 70 mg of potassium Broccoli (raw) -  cup has 145 mg of potassium Cabbage -  cup has 150 mg of potassium Carrots (cooked or raw) -  cup has 180 mg of potassium Cauliflower (raw) -  cup has 150 mg of potassium Celery (raw) -  cup has 155 mg of potassium Cereal, bran flakes -  cup has 120-150 mg of potassium Cheese (cottage) -  cup has 110 mg of potassium Cherries - 10 have 150 mg of potassium Chocolate - 1 oz bar has 165 mg of potassium Coffee (brewed) - 6 oz has 90 mg of potassium Corn -  cup or 1 ear has 195 mg of potassium Cucumbers -  cup has 80 mg of potassium Egg - 1 large egg has 60 mg of potassium Eggplant -  cup has 60 mg of potassium Endive (raw) -  cup has 80 mg of potassium English muffin - 1 has 65 mg of potassium Fish (ocean perch) - 3 oz has 192 mg of potassium Frankfurter, beef or pork - 1 has 75 mg of potassium Fruit cocktail -  cup has 115 mg of potassium Grape juice -  cup  has 170 mg of potassium Grapefruit -  fruit has 175 mg of potassium Grapes -  cup has 155 mg of potassium Greens: kale, turnip, collard -  cup has  110-150 mg of potassium Ice cream or frozen yogurt (chocolate) -  cup has 175 mg of potassium Ice cream or frozen yogurt (vanilla) -  cup has 120-150 mg of potassium Lemons, limes - 1 each has 80 mg of potassium Lettuce - 1 cup has 100 mg of potassium Mixed vegetables -  cup has 150 mg of potassium Mushrooms, raw -  cup has 110 mg of potassium Nuts (walnuts, pecans, or macadamia) - 1 oz has 125 mg of potassium Oatmeal -  cup has 80 mg of potassium Okra -  cup has 110 mg of potassium Onions -  cup has 120 mg of potassium Peach - 1 has 185 mg of potassium Peaches (canned) -  cup has 120 mg of potassium Pears (canned) -  cup has 120 mg of potassium Peas, green (frozen) -  cup has 90 mg of potassium Peppers (Green) -  cup has 130 mg of potassium Peppers (Red) -  cup has 160 mg of potassium Pineapple juice -  cup has 165 mg of potassium Pineapple (fresh or canned) -  cup has 100 mg of potassium Plums - 1 has 105 mg of potassium Pudding, vanilla -  cup has 150 mg of potassium Raspberries -  cup has 90 mg of potassium Rhubarb -  cup has 115 mg of potassium Rice, wild -  cup has 80 mg of potassium Shrimp - 3 oz has 155 mg of potassium Spinach (raw) - 1 cup has 170 mg of potassium Strawberries -  cup has 125 mg of potassium Summer squash -  cup has 175-200 mg of potassium Swiss chard (raw) - 1 cup has 135 mg of potassium Tangerines - 1 fruit has 140 mg of potassium Tea, brewed - 6 oz has 65 mg of potassium Turnips -  cup has 140 mg of potassium Watermelon -  cup has 85 mg of potassium Wine (Red, table) - 5 oz has 180 mg of potassium Wine (White, table) - 5 oz 100 mg of potassium Low in potassium The following foods and beverages have less than 50 mg of potassium per serving. Bread (white) - 1 slice has 30 mg of potassium Carbonated beverages - 12 oz has less than 5 mg of potassium Cheese - 1 oz has 20-30 mg of potassium Cranberries -  cup has 45 mg of  potassium Cranberry juice cocktail -  cup has 20 mg of potassium Fats and oils - 1 Tbsp has less than 5 mg of potassium Hummus - 1 Tbsp has 32 mg of potassium Nectar (papaya, mango, or pear) -  cup has 35 mg of potassium Rice (white or brown) -  cup has 50 mg of potassium Spaghetti or macaroni (cooked) -  cup has 30 mg of potassium Tortilla, flour or corn - 1 has 50 mg of potassium Waffle - 1 four-inch waffle has 50 mg of potassium Water chestnuts -  cup has 40 mg of potassium Summary Potassium is a mineral found in many foods and drinks. It affects how the heart works, and helps keep fluids and minerals balanced in the body. The amount of potassium you need each day depends on your age and any existing medical conditions you may have. Your health care provider or dietitian may recommend an amount of potassium that you should have each  day. This information is not intended to replace advice given to you by your health care provider. Make sure you discuss any questions you have with your health care provider. Document Revised: 05/27/2017 Document Reviewed: 09/08/2016 Elsevier Patient Education  [place].

## 2021-06-26 NOTE — Progress Notes (Signed)
Pt here for patient teaching.    Pt given skin care instructions, Alra deodorant, and Radiaplex gel.    Reviewed areas of pertinence such as fatigue, hair loss, skin changes, breast tenderness, and breast swelling .   Pt able to give teach back of to pat skin and use unscented/gentle soap,apply Radiaplex bid, avoid applying anything to skin within 4 hours of treatment, avoid wearing an under wire bra, and to use an electric razor if they must shave.   Pt verbalizes understanding of information given and will contact nursing with any questions or concerns.    [email or web address]

## 2021-06-27 LAB — T4: T4, Total: 9.7 ug/dL (ref 4.5–12.0)

## 2021-06-30 ENCOUNTER — Ambulatory Visit
Admission: RE | Admit: 2021-06-30 | Discharge: 2021-06-30 | Disposition: A | Discharge status: Home / Self Care | Payer: Federal, State, Local not specified - PPO | Source: Ambulatory Visit | Attending: Radiation Oncology | Admitting: Radiation Oncology

## 2021-06-30 ENCOUNTER — Other Ambulatory Visit: Payer: Self-pay

## 2021-06-30 DIAGNOSIS — R5383 Other fatigue: Secondary | ICD-10-CM | POA: Diagnosis not present

## 2021-06-30 DIAGNOSIS — Z87891 Personal history of nicotine dependence: Secondary | ICD-10-CM | POA: Insufficient documentation

## 2021-06-30 DIAGNOSIS — Z8249 Family history of ischemic heart disease and other diseases of the circulatory system: Secondary | ICD-10-CM | POA: Diagnosis not present

## 2021-06-30 DIAGNOSIS — Z809 Family history of malignant neoplasm, unspecified: Secondary | ICD-10-CM | POA: Diagnosis not present

## 2021-06-30 DIAGNOSIS — Z5112 Encounter for antineoplastic immunotherapy: Secondary | ICD-10-CM | POA: Diagnosis present

## 2021-06-30 DIAGNOSIS — Z171 Estrogen receptor negative status [ER-]: Secondary | ICD-10-CM | POA: Insufficient documentation

## 2021-06-30 DIAGNOSIS — Z510 Encounter for antineoplastic radiation therapy: Secondary | ICD-10-CM | POA: Insufficient documentation

## 2021-06-30 DIAGNOSIS — C778 Secondary and unspecified malignant neoplasm of lymph nodes of multiple regions: Secondary | ICD-10-CM | POA: Diagnosis not present

## 2021-06-30 DIAGNOSIS — C50412 Malignant neoplasm of upper-outer quadrant of left female breast: Secondary | ICD-10-CM | POA: Insufficient documentation

## 2021-06-30 DIAGNOSIS — Z79899 Other long term (current) drug therapy: Secondary | ICD-10-CM | POA: Diagnosis not present

## 2021-06-30 DIAGNOSIS — Z801 Family history of malignant neoplasm of trachea, bronchus and lung: Secondary | ICD-10-CM | POA: Insufficient documentation

## 2021-07-01 ENCOUNTER — Ambulatory Visit
Admission: RE | Admit: 2021-07-01 | Discharge: 2021-07-01 | Disposition: A | Discharge status: Home / Self Care | Payer: Federal, State, Local not specified - PPO | Source: Ambulatory Visit | Attending: Radiation Oncology | Admitting: Radiation Oncology

## 2021-07-01 DIAGNOSIS — Z5112 Encounter for antineoplastic immunotherapy: Secondary | ICD-10-CM | POA: Diagnosis not present

## 2021-07-02 ENCOUNTER — Other Ambulatory Visit: Payer: Self-pay

## 2021-07-02 ENCOUNTER — Ambulatory Visit
Admission: RE | Admit: 2021-07-02 | Discharge: 2021-07-02 | Disposition: A | Discharge status: Home / Self Care | Payer: Federal, State, Local not specified - PPO | Source: Ambulatory Visit | Attending: Radiation Oncology | Admitting: Radiation Oncology

## 2021-07-02 DIAGNOSIS — Z5112 Encounter for antineoplastic immunotherapy: Secondary | ICD-10-CM | POA: Diagnosis not present

## 2021-07-03 ENCOUNTER — Ambulatory Visit
Admission: RE | Admit: 2021-07-03 | Discharge: 2021-07-03 | Disposition: A | Discharge status: Home / Self Care | Payer: Federal, State, Local not specified - PPO | Source: Ambulatory Visit | Attending: Radiation Oncology | Admitting: Radiation Oncology

## 2021-07-03 DIAGNOSIS — Z5112 Encounter for antineoplastic immunotherapy: Secondary | ICD-10-CM | POA: Diagnosis not present

## 2021-07-06 ENCOUNTER — Encounter (HOSPITAL_COMMUNITY): Payer: Self-pay

## 2021-07-06 ENCOUNTER — Ambulatory Visit
Admission: RE | Admit: 2021-07-06 | Discharge: 2021-07-06 | Disposition: A | Discharge status: Home / Self Care | Payer: Federal, State, Local not specified - PPO | Source: Ambulatory Visit | Attending: Radiation Oncology | Admitting: Radiation Oncology

## 2021-07-06 ENCOUNTER — Emergency Department (HOSPITAL_COMMUNITY): Payer: Federal, State, Local not specified - PPO

## 2021-07-06 ENCOUNTER — Inpatient Hospital Stay (HOSPITAL_COMMUNITY)
Admission: EM | Admit: 2021-07-06 | Discharge: 2021-07-13 | DRG: 872 | Disposition: A | Discharge status: Home / Self Care | Payer: Federal, State, Local not specified - PPO | Attending: Internal Medicine | Admitting: Internal Medicine

## 2021-07-06 ENCOUNTER — Other Ambulatory Visit: Payer: Self-pay

## 2021-07-06 DIAGNOSIS — C50412 Malignant neoplasm of upper-outer quadrant of left female breast: Secondary | ICD-10-CM | POA: Diagnosis present

## 2021-07-06 DIAGNOSIS — Z171 Estrogen receptor negative status [ER-]: Secondary | ICD-10-CM

## 2021-07-06 DIAGNOSIS — Z79899 Other long term (current) drug therapy: Secondary | ICD-10-CM

## 2021-07-06 DIAGNOSIS — R000 Tachycardia, unspecified: Secondary | ICD-10-CM

## 2021-07-06 DIAGNOSIS — D62 Acute posthemorrhagic anemia: Secondary | ICD-10-CM | POA: Diagnosis present

## 2021-07-06 DIAGNOSIS — Z20822 Contact with and (suspected) exposure to covid-19: Secondary | ICD-10-CM | POA: Diagnosis present

## 2021-07-06 DIAGNOSIS — E860 Dehydration: Secondary | ICD-10-CM | POA: Diagnosis present

## 2021-07-06 DIAGNOSIS — K529 Noninfective gastroenteritis and colitis, unspecified: Secondary | ICD-10-CM | POA: Diagnosis present

## 2021-07-06 DIAGNOSIS — Z9012 Acquired absence of left breast and nipple: Secondary | ICD-10-CM

## 2021-07-06 DIAGNOSIS — Z95828 Presence of other vascular implants and grafts: Secondary | ICD-10-CM

## 2021-07-06 DIAGNOSIS — E861 Hypovolemia: Secondary | ICD-10-CM | POA: Diagnosis present

## 2021-07-06 DIAGNOSIS — Z9221 Personal history of antineoplastic chemotherapy: Secondary | ICD-10-CM

## 2021-07-06 DIAGNOSIS — E876 Hypokalemia: Secondary | ICD-10-CM | POA: Diagnosis present

## 2021-07-06 DIAGNOSIS — Z91018 Allergy to other foods: Secondary | ICD-10-CM

## 2021-07-06 DIAGNOSIS — A419 Sepsis, unspecified organism: Principal | ICD-10-CM | POA: Diagnosis present

## 2021-07-06 DIAGNOSIS — I10 Essential (primary) hypertension: Secondary | ICD-10-CM | POA: Diagnosis present

## 2021-07-06 DIAGNOSIS — K633 Ulcer of intestine: Secondary | ICD-10-CM | POA: Diagnosis present

## 2021-07-06 DIAGNOSIS — K5010 Crohn's disease of large intestine without complications: Secondary | ICD-10-CM | POA: Diagnosis present

## 2021-07-06 DIAGNOSIS — Z87891 Personal history of nicotine dependence: Secondary | ICD-10-CM

## 2021-07-06 DIAGNOSIS — R7303 Prediabetes: Secondary | ICD-10-CM | POA: Diagnosis present

## 2021-07-06 LAB — COMPREHENSIVE METABOLIC PANEL
ALT: 13 U/L (ref 0–44)
AST: 18 U/L (ref 15–41)
Albumin: 3.9 g/dL (ref 3.5–5.0)
Alkaline Phosphatase: 52 U/L (ref 38–126)
Anion gap: 10 (ref 5–15)
BUN: 6 mg/dL (ref 6–20)
CO2: 30 mmol/L (ref 22–32)
Calcium: 9.1 mg/dL (ref 8.9–10.3)
Chloride: 99 mmol/L (ref 98–111)
Creatinine, Ser: 0.96 mg/dL (ref 0.44–1.00)
GFR, Estimated: >60 mL/min (ref >60)
Glucose, Bld: 114 mg/dL — ABNORMAL HIGH (ref 70–99)
Potassium: 2.9 mmol/L — ABNORMAL LOW (ref 3.5–5.1)
Sodium: 139 mmol/L (ref 135–145)
Total Bilirubin: 0.5 mg/dL (ref 0.3–1.2)
Total Protein: 7.6 g/dL (ref 6.5–8.1)

## 2021-07-06 LAB — CBC WITH DIFFERENTIAL/PLATELET
Abs Immature Granulocytes: 0.06 10*3/uL (ref 0.00–0.07)
Basophils Absolute: 0.1 10*3/uL (ref 0.0–0.1)
Basophils Relative: 1 %
Eosinophils Absolute: 0.1 10*3/uL (ref 0.0–0.5)
Eosinophils Relative: 1 %
HCT: 38.2 % (ref 36.0–46.0)
Hemoglobin: 12.4 g/dL (ref 12.0–15.0)
Immature Granulocytes: 1 %
Lymphocytes Relative: 3 %
Lymphs Abs: 0.4 10*3/uL — ABNORMAL LOW (ref 0.7–4.0)
MCH: 29.8 pg (ref 26.0–34.0)
MCHC: 32.5 g/dL (ref 30.0–36.0)
MCV: 91.8 fL (ref 80.0–100.0)
Monocytes Absolute: 1.1 10*3/uL — ABNORMAL HIGH (ref 0.1–1.0)
Monocytes Relative: 10 %
Neutro Abs: 9.8 10*3/uL — ABNORMAL HIGH (ref 1.7–7.7)
Neutrophils Relative %: 84 %
Platelets: 217 10*3/uL (ref 150–400)
RBC: 4.16 MIL/uL (ref 3.87–5.11)
RDW: 15.1 % (ref 11.5–15.5)
WBC Morphology: INCREASED
WBC: 11.4 10*3/uL — ABNORMAL HIGH (ref 4.0–10.5)
nRBC: 0 % (ref 0.0–0.2)

## 2021-07-06 LAB — RESP PANEL BY RT-PCR (FLU A&B, COVID) ARPGX2
Influenza A by PCR: NEGATIVE
Influenza B by PCR: NEGATIVE
SARS Coronavirus 2 by RT PCR: NEGATIVE

## 2021-07-06 LAB — C DIFFICILE QUICK SCREEN W PCR REFLEX
C Diff antigen: NEGATIVE
C Diff interpretation: NOT DETECTED
C Diff toxin: NEGATIVE

## 2021-07-06 LAB — LIPASE, BLOOD: Lipase: 25 U/L (ref 11–51)

## 2021-07-06 LAB — MAGNESIUM: Magnesium: 1.6 mg/dL — ABNORMAL LOW (ref 1.7–2.4)

## 2021-07-06 MED ORDER — CEFTRIAXONE SODIUM 1 G IJ SOLR
1.0000 g | Freq: Once | INTRAMUSCULAR | Status: AC
  Administered 2021-07-06: 1 g via INTRAVENOUS
  Filled 2021-07-06: qty 10

## 2021-07-06 MED ORDER — POLYVINYL ALCOHOL 1.4 % OP SOLN
1.0000 [drp] | Freq: Three times a day (TID) | OPHTHALMIC | Status: DC | PRN
  Administered 2021-07-12: 1 [drp] via OPHTHALMIC
  Filled 2021-07-06: qty 15

## 2021-07-06 MED ORDER — SODIUM CHLORIDE 0.9 % IV BOLUS
1000.0000 mL | Freq: Once | INTRAVENOUS | Status: AC
  Administered 2021-07-06: 1000 mL via INTRAVENOUS

## 2021-07-06 MED ORDER — ACETAMINOPHEN 500 MG PO TABS
1000.0000 mg | ORAL_TABLET | Freq: Once | ORAL | Status: AC
  Administered 2021-07-06: 1000 mg via ORAL
  Filled 2021-07-06: qty 2

## 2021-07-06 MED ORDER — POTASSIUM CHLORIDE 10 MEQ/100ML IV SOLN
10.0000 meq | Freq: Once | INTRAVENOUS | Status: AC
  Administered 2021-07-06: 10 meq via INTRAVENOUS
  Filled 2021-07-06: qty 100

## 2021-07-06 MED ORDER — LACTATED RINGERS IV SOLN: INTRAVENOUS | Status: AC

## 2021-07-06 MED ORDER — IOHEXOL 300 MG/ML  SOLN
100.0000 mL | Freq: Once | INTRAMUSCULAR | Status: AC | PRN
  Administered 2021-07-06: 100 mL via INTRAVENOUS

## 2021-07-06 MED ORDER — MORPHINE SULFATE (PF) 2 MG/ML IV SOLN: 1.0000 mg | INTRAVENOUS | Status: DC | PRN

## 2021-07-06 MED ORDER — ACETAMINOPHEN 650 MG RE SUPP: 650.0000 mg | Freq: Four times a day (QID) | RECTAL | Status: DC | PRN

## 2021-07-06 MED ORDER — POTASSIUM CHLORIDE CRYS ER 20 MEQ PO TBCR
40.0000 meq | EXTENDED_RELEASE_TABLET | ORAL | Status: AC
  Administered 2021-07-06 (×2): 40 meq via ORAL
  Filled 2021-07-06 (×2): qty 2

## 2021-07-06 MED ORDER — LACTATED RINGERS IV BOLUS
1000.0000 mL | Freq: Once | INTRAVENOUS | Status: AC
  Administered 2021-07-06: 1000 mL via INTRAVENOUS

## 2021-07-06 MED ORDER — ACETAMINOPHEN 325 MG PO TABS
650.0000 mg | ORAL_TABLET | Freq: Four times a day (QID) | ORAL | Status: DC | PRN
  Administered 2021-07-13: 650 mg via ORAL
  Filled 2021-07-06: qty 2

## 2021-07-06 MED ORDER — ONDANSETRON HCL 4 MG/2ML IJ SOLN
4.0000 mg | Freq: Once | INTRAMUSCULAR | Status: AC
Start: 2021-07-06 — End: 2021-07-06
  Administered 2021-07-06: 4 mg via INTRAVENOUS
  Filled 2021-07-06: qty 2

## 2021-07-06 MED ORDER — MORPHINE SULFATE (PF) 4 MG/ML IV SOLN
4.0000 mg | Freq: Once | INTRAVENOUS | Status: AC
Start: 2021-07-06 — End: 2021-07-06
  Administered 2021-07-06: 4 mg via INTRAVENOUS
  Filled 2021-07-06: qty 1

## 2021-07-06 MED ORDER — MAGNESIUM SULFATE 2 GM/50ML IV SOLN
2.0000 g | Freq: Once | INTRAVENOUS | Status: AC
  Administered 2021-07-06: 2 g via INTRAVENOUS
  Filled 2021-07-06: qty 50

## 2021-07-06 MED ORDER — MAGNESIUM OXIDE -MG SUPPLEMENT 400 (240 MG) MG PO TABS
800.0000 mg | ORAL_TABLET | Freq: Once | ORAL | Status: AC
Start: 2021-07-06 — End: 2021-07-06
  Administered 2021-07-06: 800 mg via ORAL
  Filled 2021-07-06: qty 2

## 2021-07-06 MED ORDER — METRONIDAZOLE 500 MG/100ML IV SOLN
500.0000 mg | Freq: Two times a day (BID) | INTRAVENOUS | Status: DC
  Administered 2021-07-06 – 2021-07-10 (×8): 500 mg via INTRAVENOUS
  Filled 2021-07-06 (×8): qty 100

## 2021-07-06 MED ORDER — ONDANSETRON HCL 4 MG/2ML IJ SOLN: 4.0000 mg | Freq: Four times a day (QID) | INTRAMUSCULAR | Status: DC | PRN

## 2021-07-06 MED ORDER — SODIUM CHLORIDE 0.9 % IV SOLN
2.0000 g | INTRAVENOUS | Status: DC
  Administered 2021-07-07 – 2021-07-09 (×3): 2 g via INTRAVENOUS
  Filled 2021-07-06 (×4): qty 20

## 2021-07-06 NOTE — ED Provider Triage Note (Signed)
Emergency Medicine Provider Triage Evaluation Note  Rita Cole , a 61 y.o. female  was evaluated in triage.  Pt complains of 3-week duration of abdominal pain, nausea, vomiting, lack of appetite, diarrhea, and rectal bleeding.  She was seen by her gastroenterologist and was started on mesalamine and Benefiber without improvement in her symptoms.  She denies chest pain, dyspnea, lightheadedness.  Review of Systems  Positive: As above Negative: As above  Physical Exam  BP (!) 106/91 (BP Location: Right Arm)    Pulse (!) 129    Temp 99.3 F (37.4 C) (Oral)    Resp (!) 22    Ht 5\' 5"  (1.651 m)    Wt 84.6 kg    SpO2 100%    BMI 31.04 kg/m  Gen:   Awake, no distress   Resp:  Normal effort  MSK:   Moves extremities without difficulty  Other:  Tenderness to palpation of abdomen  Medical Decision Making  Medically screening exam initiated at 4:08 PM.  Appropriate orders placed.  Sibyl L Withers-Smith was informed that the remainder of the evaluation will be completed by another provider, this initial triage assessment does not replace that evaluation, and the importance of remaining in the ED until their evaluation is complete.     Evlyn Courier, PA-C 07/06/21 1609

## 2021-07-06 NOTE — H&P (Signed)
Initial vitals showed History and Physical    Rita Cole DOB: 11-18-1960 DOA: 07/06/2021  PCP: Riki Sheer, NP  Patient coming from: Home  I have personally briefly reviewed patient's old medical records in [place]  Chief Complaint: Abdominal pain, diarrhea, nausea, vomiting  HPI: Rita Cole is a 61 y.o. female with medical history significant for malignant left-sided breast cancer (s/p neoadjuvant chemotherapy, modified radical mastectomy, on active radiation therapy and pembrolizumab treatment), hypertension, and reported recent diagnosis of Crohn's disease who presented to the ED for evaluation of abdominal pain with diarrhea, nausea, and vomiting.  Patient reports several weeks of persistent diarrhea occasionally containing red blood.  She says she underwent colonoscopy in April 2022 by her GI specialist, Dr. Gerri Spore, with [facility] and was told she had inflammation in her colon, suspected Crohn's disease.  Several weeks ago patient was started on mesalamine 1.2 g twice daily and has been taking Benefiber per GI specialist recommendations.  Patient states that she is not able to maintain any adequate oral intake as everything she eats passes through her too quickly resulting in diarrhea.  Over the last week she has developed nausea and vomiting.  She denies any blood in her emesis.  She has been having generalized abdominal pain, more pronounced in the periumbilical area.  She denied any subjective fevers, chest pain, dyspnea, dysuria, peripheral edema.  Her last radiation treatment was on 1/6. Her last Keytruda infusion was on 12/30.   ED Course:  Initial vitals showed BP 106/91, pulse 129, RR 20, temp 99.3 F, SPO2 100% on room air.  T-max 101.3 F while in the ED.  Labs show WBC 11.4, hemoglobin 12.4, platelets 217,000, sodium 139, potassium 3.9, magnesium 1.6, bicarb 30, BUN 6, creatinine 0.96, serum glucose 114,  LFTs within normal limits, lipase 25.  Respiratory panel ordered and pending collection.  CT abdomen/pelvis with contrast showed changes of the colon likely reflecting infectious versus inflammatory colitis.  Patient was given oral potassium 40 mEq, IV potassium 10 mEq x 1, oral magnesium 800 mg, IV magnesium 2 g, 1 L normal saline, IV morphine 4 mg, IV Zofran, and started on IV ceftriaxone and Flagyl.  EDP discussed with on-call GI who recommended against starting steroids for now pending C. difficile/GI pathogen results and reconsult if needed.  The hospitalist service was consulted to admit for further evaluation and management.  Review of Systems: All systems reviewed and are negative except as documented in history of present illness above.   Past Medical History:  Diagnosis Date   Breast cancer [facility])    Family history of lung cancer 10/02/2020   Hypertension    Pre-diabetes     Past Surgical History:  Procedure Laterality Date   BREAST SURGERY     BUNIONECTOMY     IR IMAGING GUIDED PORT INSERTION  10/14/2020   MODIFIED MASTECTOMY Left 05/14/2021   Procedure: LEFT MODIFIED RADICAL MASTECTOMY;  Surgeon: Rolm Bookbinder, MD;  Location: [facility];  Service: General;  Laterality: Left;   MYOMECTOMY     TONSILLECTOMY     as a child    Social History:  reports that she has quit smoking. Her smoking use included cigarettes. She smoked an average of .25 packs per day. She has never used smokeless tobacco. She reports that she does not drink alcohol and does not use drugs.  Allergies  Allergen Reactions   Banana     Family History  Problem Relation Age  of Onset   Heart attack Father    Lung cancer Father        dx unknown age   1 Other        MGM's mother; unknown type; dx unknown age     Prior to Admission medications   Medication Sig Start Date End Date Taking? Authorizing Provider  acetaminophen (TYLENOL) 500 MG tablet Take 500-1,000 mg by mouth every 6 (six) hours  as needed for mild pain, headache or fever.   Yes [provider]  amLODipine (NORVASC) 10 MG tablet Take 1 tablet (10 mg total) by mouth daily. 03/31/21  Yes Nicholas Lose, MD  calcium carbonate (TUMS - DOSED IN MG ELEMENTAL CALCIUM) 500 MG chewable tablet Chew 1-2 tablets by mouth as needed for indigestion or heartburn.   Yes [provider]  hydroxypropyl methylcellulose / hypromellose (ISOPTO TEARS / GONIOVISC) 2.5 % ophthalmic solution Place 1 drop into both eyes 3 (three) times daily as needed for dry eyes.   Yes [provider]  lidocaine-prilocaine (EMLA) cream Apply 1 application topically as directed. 05/28/21  Yes [provider]  losartan (COZAAR) 100 MG tablet Take 100 mg by mouth daily. 02/26/21  Yes [provider]  mesalamine (LIALDA) 1.2 g EC tablet Take 1.2 g by mouth 2 (two) times daily. 06/11/21  Yes [provider]  metoprolol succinate (TOPROL-XL) 50 MG 24 hr tablet Take 50 mg by mouth daily. 02/26/21  Yes [provider]  Pembrolizumab (KEYTRUDA IV) Inject 200 mg into the vein See admin instructions. pembrolizumab (KEYTRUDA) 200 mg in sodium chloride 0.9 % 50 mL chemo infusion- Once @ 116 mL/hr over 30 Minutes   Yes [provider]  triamterene-hydrochlorothiazide (MAXZIDE-25) 37.5-25 MG tablet Take 1 tablet by mouth daily. 02/26/21  Yes [provider]  Vitamin D, Ergocalciferol, (DRISDOL) 1.25 MG (50000 UNIT) CAPS capsule Take 50,000 Units by mouth every Tuesday. 08/19/20  Yes [provider]  Wheat Dextrin (BENEFIBER) POWD Take by mouth See admin instructions. Mix 2 teaspoonful of powder into 4-8 ounces of a desired beverage and drink once a day   Yes [provider]  hydrochlorothiazide (HYDRODIURIL) 25 MG tablet Take 1 tablet (25 mg total) by mouth daily. Patient not taking: Reported on 11/02/175 9/39/03   Delora Fuel, MD  ketorolac (TORADOL) 10 MG tablet Take 1 tablet (10 mg total) by  mouth every 6 (six) hours as needed. Patient not taking: Reported on 07/06/2021 09/06/20   Arnaldo Natal, MD  meloxicam (MOBIC) 15 MG tablet Take 1 tablet (15 mg total) by mouth daily. Patient not taking: Reported on 0/0/9233 0/07/62   Delora Fuel, MD  methocarbamol (ROBAXIN) 750 MG tablet Take 1 tablet (750 mg total) by mouth every 8 (eight) hours as needed (use for muscle cramps/pain). Patient not taking: Reported on 07/06/2021 05/15/21   Rolm Bookbinder, MD  pyridoxine (B-6) 100 MG tablet Take 100 mg by mouth daily. Patient not taking: Reported on 07/06/2021    [provider]  cetirizine (ZYRTEC ALLERGY) 10 MG tablet Take 1 tablet (10 mg total) by mouth daily. Patient not taking: Reported on 09/06/2020 05/29/17 09/06/20  Jaynee Eagles, PA-C  prochlorperazine (COMPAZINE) 10 MG tablet Take 1 tablet (10 mg total) by mouth every 6 (six) hours as needed (Nausea or vomiting). 10/01/20 05/27/21  Nicholas Lose, MD    Physical Exam: Vitals:   07/06/21 1812 07/06/21 1830 07/06/21 2013 07/06/21 2132  BP: (!) 151/72 (!) 147/87 109/78 113/67  Pulse: Marland Kitchen)  125 (!) 133 (!) 119 (!) 109  Resp: 18 (!) 21 (!) 22 20  Temp: (!) 101.3 F (38.5 C)   (!) 100.4 F (38 C)  TempSrc: Oral   Oral  SpO2: 100% 99% 98% 98%  Weight:      Height:       Constitutional: Resting in bed with head elevated, NAD, calm, comfortable Eyes: PERRL, lids and conjunctivae normal ENMT: Mucous membranes are dry. Posterior pharynx clear of any exudate or lesions.Normal dentition.  Neck: normal, supple, no masses. Respiratory: clear to auscultation bilaterally, no wheezing, no crackles. Normal respiratory effort. No accessory muscle use.  Cardiovascular: Tachycardic, no murmurs / rubs / gallops. No extremity edema. 2+ pedal pulses.  Port in place right upper chest. Abdomen: Mild generalized tenderness, no masses palpated. No hepatosplenomegaly. Bowel sounds positive.  Musculoskeletal: no clubbing / cyanosis. No joint deformity  upper and lower extremities. Good ROM, no contractures. Normal muscle tone.  Skin: no rashes, lesions, ulcers. No induration Neurologic: CN 2-12 grossly intact. Sensation intact. Strength 5/5 in all 4.  Psychiatric: Normal judgment and insight. Alert and oriented x 3. Normal mood.   Labs on Admission: I have personally reviewed following labs and imaging studies  CBC: Recent Labs  Lab 07/06/21 1657  WBC 11.4*  NEUTROABS 9.8*  HGB 12.4  HCT 38.2  MCV 91.8  PLT 161   Basic Metabolic Panel: Recent Labs  Lab 07/06/21 1657  NA 139  K 2.9*  CL 99  CO2 30  GLUCOSE 114*  BUN 6  CREATININE 0.96  CALCIUM 9.1  MG 1.6*   GFR: Estimated Creatinine Clearance: 67.7 mL/min (by C-G formula based on SCr of 0.96 mg/dL). Liver Function Tests: Recent Labs  Lab 07/06/21 1657  AST 18  ALT 13  ALKPHOS 52  BILITOT 0.5  PROT 7.6  ALBUMIN 3.9   Recent Labs  Lab 07/06/21 1657  LIPASE 25   No results for input(s): AMMONIA in the last 168 hours. Coagulation Profile: No results for input(s): INR, PROTIME in the last 168 hours. Cardiac Enzymes: No results for input(s): CKTOTAL, CKMB, CKMBINDEX, TROPONINI in the last 168 hours. BNP (last 3 results) No results for input(s): PROBNP in the last 8760 hours. HbA1C: No results for input(s): HGBA1C in the last 72 hours. CBG: No results for input(s): GLUCAP in the last 168 hours. Lipid Profile: No results for input(s): CHOL, HDL, LDLCALC, TRIG, CHOLHDL, LDLDIRECT in the last 72 hours. Thyroid Function Tests: No results for input(s): TSH, T4TOTAL, FREET4, T3FREE, THYROIDAB in the last 72 hours. Anemia Panel: No results for input(s): VITAMINB12, FOLATE, FERRITIN, TIBC, IRON, RETICCTPCT in the last 72 hours. Urine analysis: No results found for: COLORURINE, APPEARANCEUR, LABSPEC, [place], GLUCOSEU, HGBUR, BILIRUBINUR, KETONESUR, PROTEINUR, UROBILINOGEN, NITRITE, LEUKOCYTESUR  Radiological Exams on Admission: CT ABDOMEN PELVIS W  CONTRAST  Result Date: 07/06/2021 CLINICAL DATA:  Abdominal pain, acute, nonlocalized EXAM: CT ABDOMEN AND PELVIS WITH CONTRAST TECHNIQUE: Multidetector CT imaging of the abdomen and pelvis was performed using the standard protocol following bolus administration of intravenous contrast. CONTRAST:  148mL OMNIPAQUE IOHEXOL 300 MG/ML  SOLN COMPARISON:  Report from 10/07/2020. Images are unavailable at time of dictation. FINDINGS: Lower chest: No acute abnormality. Partially imaged left mastectomy. Hepatobiliary: 2 subcentimeter low-density hepatic lesions, which were present previously by report. Gallbladder is unremarkable. No biliary dilatation. Pancreas: Small subcentimeter low-density area near the junction of body and tail of pancreas was present previously but report. Spleen: Unremarkable. Adrenals/Urinary Tract: Approximally 1 cm left adrenal  nodule was present on prior study by report. Right adrenals unremarkable. Few too small to characterize low-density renal lesions. Bladder is unremarkable. Stomach/Bowel: Stomach is within normal limits. Bowel is normal in caliber. Fluid and air within the colon. There is mild colonic wall thickening throughout with mild infiltration of adjacent fat. Normal appendix. Vascular/Lymphatic: Mild atherosclerosis.  No enlarged nodes. Reproductive: Anteverted with small calcified fibroids. No adnexal mass. Other: Small fat containing right inguinal hernia. Musculoskeletal: Lower lumbar spine degenerative changes. IMPRESSION: Abnormal appearance of colon likely reflecting infectious/inflammatory colitis. Images from prior study unavailable at time of dictation. Likely no additional substantial change in comparison to the prior study report. Electronically Signed   By: Macy Mis M.D.   On: 07/06/2021 18:30    EKG: Personally reviewed. Sinus tachycardia, rate 133.  Rate is faster when compared to prior.  Assessment/Plan Principal Problem:   Sepsis ([place]) Active  Problems:   Malignant neoplasm of upper-outer quadrant of left breast in female, estrogen receptor negative ([place])   Colitis   Hypokalemia   Hypomagnesemia   Hypertension   Rita Cole is a 61 y.o. female with medical history significant for malignant left-sided breast cancer (s/p neoadjuvant chemotherapy, modified radical mastectomy, on active radiation therapy and pembrolizumab treatment), hypertension, and reported recent diagnosis of Crohn's disease who is admitted with sepsis due to colitis.  Sepsis due to colitis, POA: Patient presenting with fever, tachycardia, tachypnea and CT imaging findings consistent with colitis.  Differential includes infectious versus inflammatory versus medication associated colitis. -Started on empiric IV ceftriaxone and Flagyl -Follow C. difficile and GI pathogen panels -If C. difficile positive, discontinue current antibiotics and start on fidaxomicin or oral vancomycin -Give 1 L LR bolus followed by maintenance fluids overnight -Check lactic acid and blood cultures  Hypokalemia/hypomagnesemia: Received oral and IV supplementation of potassium magnesium.  Hold HCTZ.  Repeat labs in AM.  Reported history of recently diagnosed Crohn's disease: Per patient, diagnosed by her GI specialist, Dr. Gerri Spore, with [facility].  Has been on mesalamine which is on hold for now.  Hypertension: BP on the softer side in setting of hypovolemia.  Hold amlodipine, losartan, Toprol-XL, triamterene-HCTZ.  Malignant left-sided breast cancer: Follows with oncology, Dr. Lindi Adie, and radiation oncology Dr. Lisbeth Renshaw.  S/p neoadjuvant chemotherapy followed by mastectomy currently receiving radiation and immunotherapy with pembrolizumab.  DVT prophylaxis: SCDs only for now given report of occasional blood in stool Code Status: Full code, confirmed with patient on admission Family Communication: Discussed with patient's daughter at bedside Disposition Plan:  From home and likely discharge to home pending clinical progress. Consults called: None Level of care: Med-Surg Admission status:  Status is: Observation  The patient remains OBS appropriate and will d/c before 2 midnights.   Zada Finders MD [facility]  If 7PM-7AM, please contact night-coverage [email or web address]  07/06/2021, 9:33 PM

## 2021-07-06 NOTE — ED Triage Notes (Signed)
Patient c/o abdominal x10 days. Pt stated she had BM x 12 days and emesis x2 today. Pt stated she was seen with primary and prescribe with medication but unable to help.  Pt denies fever. Pt a/ox4.

## 2021-07-06 NOTE — ED Provider Notes (Signed)
[facility] DEPT Provider Note   CSN: [id number] Arrival date & time: 07/06/21  1431     History  Chief Complaint  Patient presents with   Diarrhea   Nausea   Emesis    Rita Cole is a 61 y.o. female.  61 yo F with a chief complaint of diarrhea.  This is been going on for about 10 days or so.  She feels like its been grossly bloody off and on.  Has had some periumbilical abdominal pain some nausea and vomiting with it.  She has a history of breast cancer and is status postresection and is done chemotherapy and now is getting radiation therapy.  At that time she thinks that she is breast cancer free.  She has seen a gastroenterologist for her diarrhea.  They started her on mesalamine.  She has not had significant improvement with that.  Has been taking Imodium also without improvement.  Denies fevers or chills.  Denies trauma.  The history is provided by the patient.  Diarrhea Associated symptoms: vomiting   Associated symptoms: no arthralgias, no chills, no fever, no headaches and no myalgias   Emesis Associated symptoms: diarrhea   Associated symptoms: no arthralgias, no chills, no fever, no headaches and no myalgias   Illness Severity:  Mild Onset quality:  Gradual Duration:  10 days Timing:  Constant Progression:  Worsening Chronicity:  New Associated symptoms: diarrhea and vomiting   Associated symptoms: no chest pain, no congestion, no fever, no headaches, no myalgias, no nausea, no rhinorrhea, no shortness of breath and no wheezing       Home Medications Prior to Admission medications   Medication Sig Start Date End Date Taking? Authorizing Provider  acetaminophen (TYLENOL) 500 MG tablet Take 500-1,000 mg by mouth every 6 (six) hours as needed for mild pain, headache or fever.   Yes [provider]  amLODipine (NORVASC) 10 MG tablet Take 1 tablet (10 mg total) by mouth daily. 03/31/21  Yes Nicholas Lose, MD  calcium  carbonate (TUMS - DOSED IN MG ELEMENTAL CALCIUM) 500 MG chewable tablet Chew 1-2 tablets by mouth as needed for indigestion or heartburn.   Yes [provider]  hydroxypropyl methylcellulose / hypromellose (ISOPTO TEARS / GONIOVISC) 2.5 % ophthalmic solution Place 1 drop into both eyes 3 (three) times daily as needed for dry eyes.   Yes [provider]  lidocaine-prilocaine (EMLA) cream Apply 1 application topically as directed. 05/28/21  Yes [provider]  losartan (COZAAR) 100 MG tablet Take 100 mg by mouth daily. 02/26/21  Yes [provider]  mesalamine (LIALDA) 1.2 g EC tablet Take 1.2 g by mouth 2 (two) times daily. 06/11/21  Yes [provider]  metoprolol succinate (TOPROL-XL) 50 MG 24 hr tablet Take 50 mg by mouth daily. 02/26/21  Yes [provider]  Pembrolizumab (KEYTRUDA IV) Inject 200 mg into the vein See admin instructions. pembrolizumab (KEYTRUDA) 200 mg in sodium chloride 0.9 % 50 mL chemo infusion- Once @ 116 mL/hr over 30 Minutes   Yes [provider]  triamterene-hydrochlorothiazide (MAXZIDE-25) 37.5-25 MG tablet Take 1 tablet by mouth daily. 02/26/21  Yes [provider]  Vitamin D, Ergocalciferol, (DRISDOL) 1.25 MG (50000 UNIT) CAPS capsule Take 50,000 Units by mouth every Tuesday. 08/19/20  Yes [provider]  Wheat Dextrin (BENEFIBER) POWD Take by mouth See admin instructions. Mix 2 teaspoonful of powder into 4-8 ounces of a desired beverage and drink once a day  Yes [provider]  hydrochlorothiazide (HYDRODIURIL) 25 MG tablet Take 1 tablet (25 mg total) by mouth daily. Patient not taking: Reported on 08/04/9483 4/62/70   Delora Fuel, MD  ketorolac (TORADOL) 10 MG tablet Take 1 tablet (10 mg total) by mouth every 6 (six) hours as needed. Patient not taking: Reported on 07/06/2021 09/06/20   Arnaldo Natal, MD  meloxicam (MOBIC) 15 MG tablet Take 1 tablet (15 mg total) by mouth daily. Patient  not taking: Reported on 08/31/91 02/13/28   Delora Fuel, MD  methocarbamol (ROBAXIN) 750 MG tablet Take 1 tablet (750 mg total) by mouth every 8 (eight) hours as needed (use for muscle cramps/pain). Patient not taking: Reported on 07/06/2021 05/15/21   Rolm Bookbinder, MD  pyridoxine (B-6) 100 MG tablet Take 100 mg by mouth daily. Patient not taking: Reported on 07/06/2021    [provider]  cetirizine (ZYRTEC ALLERGY) 10 MG tablet Take 1 tablet (10 mg total) by mouth daily. Patient not taking: Reported on 09/06/2020 05/29/17 09/06/20  Jaynee Eagles, PA-C  prochlorperazine (COMPAZINE) 10 MG tablet Take 1 tablet (10 mg total) by mouth every 6 (six) hours as needed (Nausea or vomiting). 10/01/20 05/27/21  Nicholas Lose, MD      Allergies    Banana    Review of Systems   Review of Systems  Constitutional:  Negative for chills and fever.  HENT:  Negative for congestion and rhinorrhea.   Eyes:  Negative for redness and visual disturbance.  Respiratory:  Negative for shortness of breath and wheezing.   Cardiovascular:  Negative for chest pain and palpitations.  Gastrointestinal:  Positive for diarrhea and vomiting. Negative for nausea.  Genitourinary:  Negative for dysuria and urgency.  Musculoskeletal:  Negative for arthralgias and myalgias.  Skin:  Negative for pallor and wound.  Neurological:  Negative for dizziness and headaches.   Physical Exam Updated Vital Signs BP (!) 147/87    Pulse (!) 133    Temp (!) 101.3 F (38.5 C) (Oral)    Resp (!) 21    Ht 5\' 5"  (1.651 m)    Wt 84.6 kg    SpO2 99%    BMI 31.04 kg/m  Physical Exam Vitals and nursing note reviewed.  Constitutional:      General: She is not in acute distress.    Appearance: She is well-developed. She is not diaphoretic.  HENT:     Head: Normocephalic and atraumatic.  Eyes:     Pupils: Pupils are equal, round, and reactive to light.  Cardiovascular:     Rate and Rhythm: Regular rhythm. Tachycardia present.     Heart  sounds: No murmur heard.   No friction rub. No gallop.  Pulmonary:     Effort: Pulmonary effort is normal.     Breath sounds: No wheezing or rales.  Abdominal:     General: There is no distension.     Palpations: Abdomen is soft.     Tenderness: There is abdominal tenderness (mild diffuse tenderness, worst to the LLQ).  Musculoskeletal:        General: No tenderness.     Cervical back: Normal range of motion and neck supple.  Skin:    General: Skin is warm and dry.  Neurological:     Mental Status: She is alert and oriented to person, place, and time.  Psychiatric:        Behavior: Behavior normal.    ED Results / Procedures / Treatments   Labs (all labs  ordered are listed, but only abnormal results are displayed) Labs Reviewed  CBC WITH DIFFERENTIAL/PLATELET - Abnormal; Notable for the following components:      Result Value   WBC 11.4 (*)    Neutro Abs 9.8 (*)    Lymphs Abs 0.4 (*)    Monocytes Absolute 1.1 (*)    All other components within normal limits  COMPREHENSIVE METABOLIC PANEL - Abnormal; Notable for the following components:   Potassium 2.9 (*)    Glucose, Bld 114 (*)    All other components within normal limits  MAGNESIUM - Abnormal; Notable for the following components:   Magnesium 1.6 (*)    All other components within normal limits  GASTROINTESTINAL PANEL BY PCR, STOOL (REPLACES STOOL CULTURE)  C DIFFICILE QUICK SCREEN W PCR REFLEX    RESP PANEL BY RT-PCR (FLU A&B, COVID) [id number]  LIPASE, BLOOD    EKG EKG Interpretation  Date/Time:  Monday July 06 2021 16:21:06 EST Ventricular Rate:  133 PR Interval:  151 QRS Duration: 83 QT Interval:  273 QTC Calculation: 406 R Axis:   88 Text Interpretation: Sinus tachycardia Anteroseptal infarct, old Nonspecific repol abnormality, diffuse leads Since last tracing rate faster Otherwise no significant change Confirmed by Deno Etienne [phone]) on 07/06/2021 4:40:54 PM  Radiology CT ABDOMEN PELVIS W  CONTRAST  Result Date: 07/06/2021 CLINICAL DATA:  Abdominal pain, acute, nonlocalized EXAM: CT ABDOMEN AND PELVIS WITH CONTRAST TECHNIQUE: Multidetector CT imaging of the abdomen and pelvis was performed using the standard protocol following bolus administration of intravenous contrast. CONTRAST:  134mL OMNIPAQUE IOHEXOL 300 MG/ML  SOLN COMPARISON:  Report from 10/07/2020. Images are unavailable at time of dictation. FINDINGS: Lower chest: No acute abnormality. Partially imaged left mastectomy. Hepatobiliary: 2 subcentimeter low-density hepatic lesions, which were present previously by report. Gallbladder is unremarkable. No biliary dilatation. Pancreas: Small subcentimeter low-density area near the junction of body and tail of pancreas was present previously but report. Spleen: Unremarkable. Adrenals/Urinary Tract: Approximally 1 cm left adrenal nodule was present on prior study by report. Right adrenals unremarkable. Few too small to characterize low-density renal lesions. Bladder is unremarkable. Stomach/Bowel: Stomach is within normal limits. Bowel is normal in caliber. Fluid and air within the colon. There is mild colonic wall thickening throughout with mild infiltration of adjacent fat. Normal appendix. Vascular/Lymphatic: Mild atherosclerosis.  No enlarged nodes. Reproductive: Anteverted with small calcified fibroids. No adnexal mass. Other: Small fat containing right inguinal hernia. Musculoskeletal: Lower lumbar spine degenerative changes. IMPRESSION: Abnormal appearance of colon likely reflecting infectious/inflammatory colitis. Images from prior study unavailable at time of dictation. Likely no additional substantial change in comparison to the prior study report. Electronically Signed   By: Macy Mis M.D.   On: 07/06/2021 18:30    Procedures Procedures    Medications Ordered in ED Medications  potassium chloride SA (KLOR-CON M) CR tablet 40 mEq (40 mEq Oral Given 07/06/21 1820)  cefTRIAXone  (ROCEPHIN) 1 g in sodium chloride 0.9 % 100 mL IVPB (has no administration in time range)  metroNIDAZOLE (FLAGYL) IVPB 500 mg (has no administration in time range)  sodium chloride 0.9 % bolus 1,000 mL (0 mLs Intravenous Stopped 07/06/21 1817)  ondansetron (ZOFRAN) injection 4 mg (4 mg Intravenous Given 07/06/21 1655)  morphine 4 MG/ML injection 4 mg (4 mg Intravenous Given 07/06/21 1654)  potassium chloride 10 mEq in 100 mL IVPB (0 mEq Intravenous Stopped 07/06/21 1927)  magnesium oxide (MAG-OX) tablet 800 mg (800 mg Oral Given 07/06/21 1832)  magnesium sulfate  IVPB 2 g 50 mL (0 g Intravenous Stopped 07/06/21 1927)  iohexol (OMNIPAQUE) 300 MG/ML solution 100 mL (100 mLs Intravenous Contrast Given 07/06/21 1803)  acetaminophen (TYLENOL) tablet 1,000 mg (1,000 mg Oral Given 07/06/21 1843)    ED Course/ Medical Decision Making/ A&P                           Medical Decision Making  61 yo F with a cc of diarrhea.  Going on for the past 10 days.  Patient with history of breast cancer, s/p resection, chemo, ongoing radiation for past three weeks.  I think likely diarrhea due to radiation.  No recent travel.  Started on mesalamine by GI.  Stool studies reported done in GI office negative.  Will repeat here.  Labs, fluids, CT abd pelvis. Patient denies hx of crohns.    Most recent rad oncology note endosing crohns.    I discussed case with Dr. Alessandra Bevels, gastroenterology.  He recommended checking a C. difficile sample prior to initiating steroid therapy if this was infected Crohn's it was unlikely as she has pancolitis on CT.  I discussed case with medicine for admission.  The patients results and plan were reviewed and discussed.   Any x-rays performed were independently reviewed by myself.   Differential diagnosis were considered with the presenting HPI.  Medications  potassium chloride SA (KLOR-CON M) CR tablet 40 mEq (40 mEq Oral Given 07/06/21 1820)  cefTRIAXone (ROCEPHIN) 1 g in sodium chloride 0.9 %  100 mL IVPB (has no administration in time range)  metroNIDAZOLE (FLAGYL) IVPB 500 mg (has no administration in time range)  sodium chloride 0.9 % bolus 1,000 mL (0 mLs Intravenous Stopped 07/06/21 1817)  ondansetron (ZOFRAN) injection 4 mg (4 mg Intravenous Given 07/06/21 1655)  morphine 4 MG/ML injection 4 mg (4 mg Intravenous Given 07/06/21 1654)  potassium chloride 10 mEq in 100 mL IVPB (0 mEq Intravenous Stopped 07/06/21 1927)  magnesium oxide (MAG-OX) tablet 800 mg (800 mg Oral Given 07/06/21 1832)  magnesium sulfate IVPB 2 g 50 mL (0 g Intravenous Stopped 07/06/21 1927)  iohexol (OMNIPAQUE) 300 MG/ML solution 100 mL (100 mLs Intravenous Contrast Given 07/06/21 1803)  acetaminophen (TYLENOL) tablet 1,000 mg (1,000 mg Oral Given 07/06/21 1843)    Vitals:   07/06/21 1700 07/06/21 1730 07/06/21 1812 07/06/21 1830  BP: 133/68 (!) 141/71 (!) 151/72 (!) 147/87  Pulse: (!) 115 (!) 111 (!) 125 (!) 133  Resp: (!) 25 20 18  (!) 21  Temp:   (!) 101.3 F (38.5 C)   TempSrc:   Oral   SpO2: 97% 98% 100% 99%  Weight:      Height:        Final diagnoses:  Colitis  Tachycardia  Hypokalemia  Hypomagnesemia    Admission/ observation were discussed with the admitting physician, patient and/or family and they are comfortable with the plan.          Final Clinical Impression(s) / ED Diagnoses Final diagnoses:  Colitis  Tachycardia  Hypokalemia  Hypomagnesemia    Rx / DC Orders ED Discharge Orders     None         Deno Etienne, DO 07/06/21 2009

## 2021-07-06 NOTE — ED Triage Notes (Addendum)
Pt c/o abdominal pain x10 days. Pt stated she had BM x12 days and Emesis x2days. Pt stated her primary started her on new medication since then her symptoms started. Pt denies fever. Pt a/ox4.

## 2021-07-07 ENCOUNTER — Ambulatory Visit
Admission: RE | Admit: 2021-07-07 | Discharge: 2021-07-07 | Disposition: A | Discharge status: Home / Self Care | Payer: Federal, State, Local not specified - PPO | Source: Ambulatory Visit | Attending: Radiation Oncology | Admitting: Radiation Oncology

## 2021-07-07 ENCOUNTER — Encounter (HOSPITAL_COMMUNITY): Payer: Self-pay | Admitting: Internal Medicine

## 2021-07-07 DIAGNOSIS — Z87891 Personal history of nicotine dependence: Secondary | ICD-10-CM | POA: Diagnosis not present

## 2021-07-07 DIAGNOSIS — C50412 Malignant neoplasm of upper-outer quadrant of left female breast: Secondary | ICD-10-CM

## 2021-07-07 DIAGNOSIS — K529 Noninfective gastroenteritis and colitis, unspecified: Secondary | ICD-10-CM

## 2021-07-07 DIAGNOSIS — I10 Essential (primary) hypertension: Secondary | ICD-10-CM | POA: Diagnosis present

## 2021-07-07 DIAGNOSIS — Z171 Estrogen receptor negative status [ER-]: Secondary | ICD-10-CM

## 2021-07-07 DIAGNOSIS — E861 Hypovolemia: Secondary | ICD-10-CM | POA: Diagnosis present

## 2021-07-07 DIAGNOSIS — Z9221 Personal history of antineoplastic chemotherapy: Secondary | ICD-10-CM | POA: Diagnosis not present

## 2021-07-07 DIAGNOSIS — Z9012 Acquired absence of left breast and nipple: Secondary | ICD-10-CM | POA: Diagnosis not present

## 2021-07-07 DIAGNOSIS — I150 Renovascular hypertension: Secondary | ICD-10-CM | POA: Diagnosis not present

## 2021-07-07 DIAGNOSIS — E876 Hypokalemia: Secondary | ICD-10-CM

## 2021-07-07 DIAGNOSIS — K5010 Crohn's disease of large intestine without complications: Secondary | ICD-10-CM | POA: Diagnosis present

## 2021-07-07 DIAGNOSIS — Z91018 Allergy to other foods: Secondary | ICD-10-CM | POA: Diagnosis not present

## 2021-07-07 DIAGNOSIS — Z20822 Contact with and (suspected) exposure to covid-19: Secondary | ICD-10-CM | POA: Diagnosis present

## 2021-07-07 DIAGNOSIS — R7303 Prediabetes: Secondary | ICD-10-CM | POA: Diagnosis present

## 2021-07-07 DIAGNOSIS — D62 Acute posthemorrhagic anemia: Secondary | ICD-10-CM | POA: Diagnosis present

## 2021-07-07 DIAGNOSIS — E860 Dehydration: Secondary | ICD-10-CM | POA: Diagnosis present

## 2021-07-07 DIAGNOSIS — K633 Ulcer of intestine: Secondary | ICD-10-CM | POA: Diagnosis present

## 2021-07-07 DIAGNOSIS — A419 Sepsis, unspecified organism: Secondary | ICD-10-CM | POA: Diagnosis present

## 2021-07-07 DIAGNOSIS — Z79899 Other long term (current) drug therapy: Secondary | ICD-10-CM | POA: Diagnosis not present

## 2021-07-07 LAB — BASIC METABOLIC PANEL WITH GFR
Anion gap: 7 (ref 5–15)
BUN: 5 mg/dL — ABNORMAL LOW (ref 6–20)
CO2: 26 mmol/L (ref 22–32)
Calcium: 8 mg/dL — ABNORMAL LOW (ref 8.9–10.3)
Chloride: 102 mmol/L (ref 98–111)
Creatinine, Ser: 0.95 mg/dL (ref 0.44–1.00)
GFR, Estimated: >60 mL/min
Glucose, Bld: 127 mg/dL — ABNORMAL HIGH (ref 70–99)
Potassium: 3.1 mmol/L — ABNORMAL LOW (ref 3.5–5.1)
Sodium: 135 mmol/L (ref 135–145)

## 2021-07-07 LAB — CBC
HCT: 33.7 % — ABNORMAL LOW (ref 36.0–46.0)
Hemoglobin: 10.9 g/dL — ABNORMAL LOW (ref 12.0–15.0)
MCH: 29.6 pg (ref 26.0–34.0)
MCHC: 32.3 g/dL (ref 30.0–36.0)
MCV: 91.6 fL (ref 80.0–100.0)
Platelets: 181 10*3/uL (ref 150–400)
RBC: 3.68 MIL/uL — ABNORMAL LOW (ref 3.87–5.11)
RDW: 15.4 % (ref 11.5–15.5)
WBC: 6.3 10*3/uL (ref 4.0–10.5)
nRBC: 0.3 % — ABNORMAL HIGH (ref 0.0–0.2)

## 2021-07-07 LAB — LACTIC ACID, PLASMA: Lactic Acid, Venous: 1.7 mmol/L (ref 0.5–1.9)

## 2021-07-07 LAB — PROCALCITONIN: Procalcitonin: <0.1 ng/mL

## 2021-07-07 LAB — C-REACTIVE PROTEIN: CRP: 11.6 mg/dL — ABNORMAL HIGH (ref <1.0)

## 2021-07-07 LAB — MAGNESIUM: Magnesium: 2 mg/dL (ref 1.7–2.4)

## 2021-07-07 LAB — HIV ANTIBODY (ROUTINE TESTING W REFLEX): HIV Screen 4th Generation wRfx: NONREACTIVE

## 2021-07-07 MED ORDER — SODIUM CHLORIDE 0.9 % IV SOLN: INTRAVENOUS | Status: DC

## 2021-07-07 MED ORDER — POTASSIUM CHLORIDE CRYS ER 20 MEQ PO TBCR
40.0000 meq | EXTENDED_RELEASE_TABLET | ORAL | Status: AC
  Administered 2021-07-07 (×2): 40 meq via ORAL
  Filled 2021-07-07 (×2): qty 2

## 2021-07-07 MED ORDER — METOPROLOL SUCCINATE ER 50 MG PO TB24
50.0000 mg | ORAL_TABLET | Freq: Every day | ORAL | Status: DC
  Administered 2021-07-07 – 2021-07-13 (×7): 50 mg via ORAL
  Filled 2021-07-07 (×7): qty 1

## 2021-07-07 NOTE — Progress Notes (Signed)
Ok to DC enteric precautions per PA Mel Almond.

## 2021-07-07 NOTE — Hospital Course (Signed)
Patient 61 year old female history of malignant left-sided breast cancer status post neoadjuvant IV meds chemotherapy, modified radical mastectomy on active radiation therapy and pembrolizumab treatment.  History of hypertension recent diagnosis of Crohn's presenting with sepsis felt secondary to colitis placed empirically on IV antibiotics.  Stool studies ordered and pending.  GI consulted.

## 2021-07-07 NOTE — Progress Notes (Signed)
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  Rita Cole has a history of Crohn's disease and noted significant increase in diarrhea which prompted her visit to the emergency room where she was evaluated and subsequently admitted.  She has had to undergo electrolyte repletion and hydration.  She is incredibly hungry and notes that every time she ingests anything she experiences diarrhea shortly after.  She is undergoing pembrolizumab treatment for her breast cancer most recently given about 2 weeks ago.  She denies any fevers or chills.  A C. difficile was negative and a gastrointestinal panel is in process. Her most recent vitals have been stable and she has been afebrile.  Objective:  Vitals:   07/07/21 0612 07/07/21 1405  BP: 130/78 (!) 125/93  Pulse: 82 72  Resp: 16 16  Temp: 98.6 F (37 C) 98.8 F (37.1 C)  SpO2: 95% 98%    Body mass index is 30.96 kg/m.  Intake/Output Summary (Last 24 hours) at 07/07/2021 2018 Last data filed at 07/07/2021 1700 Gross per 24 hour  Intake 1318.3 ml  Output --  Net 1318.3 ml     Sclerae unicteric  Oropharynx shows no thrush or other lesions  No cervical or supraclavicular adenopathy  Lungs no rales or wheezes--auscultated anterolaterally  Heart regular rate and rhythm  Abdomen soft, +BS slight tenderness in the abdomen.  Neuro nonfocal  Breast exam:   CBG (last 3)  No results for input(s): GLUCAP in the last 72 hours.   Labs:  Lab Results  Component Value Date   WBC 6.3 07/07/2021   HGB 10.9 (L) 07/07/2021   HCT 33.7 (L) 07/07/2021   MCV 91.6 07/07/2021   PLT 181 07/07/2021   NEUTROABS 9.8 (H) 07/06/2021    @LASTCHEMISTRY @  Urine Studies No results for  input(s): UHGB, CRYS in the last 72 hours.  Invalid input(s): UACOL, UAPR, USPG, UPH, UTP, UGL, UKET, UBIL, UNIT, UROB, ULEU, UEPI, UWBC, URBC, UBAC, CAST, UCOM, BILUA  Basic Metabolic Panel: Recent Labs  Lab 07/06/21 1657 07/07/21 0035  NA 139 135  K 2.9* 3.1*  CL 99 102  CO2 30 26  GLUCOSE 114* 127*  BUN 6 5*  CREATININE 0.96 0.95  CALCIUM 9.1 8.0*  MG 1.6* 2.0   GFR Estimated Creatinine Clearance: 68.4 mL/min (by C-G formula based on SCr of 0.95 mg/dL). Liver Function Tests: Recent Labs  Lab 07/06/21 1657  AST 18  ALT 13  ALKPHOS 52  BILITOT 0.5  PROT 7.6  ALBUMIN 3.9   Recent Labs  Lab 07/06/21 1657  LIPASE 25   No results for input(s): AMMONIA in the last 168 hours. Coagulation profile No results for input(s): INR, PROTIME in the last 168 hours.  CBC: Recent Labs  Lab 07/06/21 1657 07/07/21 0035  WBC 11.4* 6.3  NEUTROABS 9.8*  --   HGB 12.4 10.9*  HCT 38.2 33.7*  MCV 91.8 91.6  PLT 217 181   Cardiac Enzymes: No results for input(s): CKTOTAL, CKMB, CKMBINDEX, TROPONINI in the last 168 hours. BNP: Invalid input(s): POCBNP CBG: No results for input(s): GLUCAP in the last 168 hours. D-Dimer No results for input(s): DDIMER in the last 72 hours. Hgb A1c No results for input(s): HGBA1C in the last 72 hours. Lipid Profile No results for input(s): CHOL, HDL,  LDLCALC, TRIG, CHOLHDL, LDLDIRECT in the last 72 hours. Thyroid function studies No results for input(s): TSH, T4TOTAL, T3FREE, THYROIDAB in the last 72 hours.  Invalid input(s): FREET3 Anemia work up No results for input(s): VITAMINB12, FOLATE, FERRITIN, TIBC, IRON, RETICCTPCT in the last 72 hours. Microbiology Recent Results (from the past 240 hour(s))  C Difficile Quick Screen w PCR reflex     Status: None   Collection Time: 07/06/21  4:57 PM   Specimen: Stool  Result Value Ref Range Status   C Diff antigen NEGATIVE NEGATIVE Final   C Diff toxin NEGATIVE NEGATIVE Final   C Diff  interpretation No C. difficile detected.  Final    Comment: Performed at [facility], [place] [address] [place], [place] [address]  Resp Panel by RT-PCR (Flu A&B, Covid) Nasopharyngeal Swab     Status: None   Collection Time: 07/06/21  6:52 PM   Specimen: Nasopharyngeal Swab; Nasopharyngeal(NP) swabs in vial transport medium  Result Value Ref Range Status   SARS Coronavirus 2 by RT PCR NEGATIVE NEGATIVE Final    Comment: (NOTE) SARS-CoV-2 target nucleic acids are NOT DETECTED.  The SARS-CoV-2 RNA is generally detectable in upper respiratory specimens during the acute phase of infection. The lowest concentration of SARS-CoV-2 viral copies this assay can detect is 138 copies/mL. A negative result does not preclude SARS-Cov-2 infection and should not be used as the sole basis for treatment or other patient management decisions. A negative result may occur with  improper specimen collection/handling, submission of specimen other than nasopharyngeal swab, presence of viral mutation(s) within the areas targeted by this assay, and inadequate number of viral copies(<138 copies/mL). A negative result must be combined with clinical observations, patient history, and epidemiological information. The expected result is Negative.  Fact Sheet for Patients:  EntrepreneurPulse.com.au  Fact Sheet for Healthcare Providers:  IncredibleEmployment.be  This test is no t yet approved or cleared by the [place] FDA and  has been authorized for detection and/or diagnosis of SARS-CoV-2 by FDA under an Emergency Use Authorization (EUA). This EUA will remain  in effect (meaning this test can be used) for the duration of the COVID-19 declaration under Section 564(b)(1) of the Act, 21 U.S.C.section 360bbb-3(b)(1), unless the authorization is terminated  or revoked sooner.       Influenza A by PCR NEGATIVE NEGATIVE Final   Influenza B by PCR NEGATIVE  NEGATIVE Final    Comment: (NOTE) The Xpert Xpress SARS-CoV-2/FLU/RSV plus assay is intended as an aid in the diagnosis of influenza from Nasopharyngeal swab specimens and should not be used as a sole basis for treatment. Nasal washings and aspirates are unacceptable for Xpert Xpress SARS-CoV-2/FLU/RSV testing.  Fact Sheet for Patients: EntrepreneurPulse.com.au  Fact Sheet for Healthcare Providers: IncredibleEmployment.be  This test is not yet approved or cleared by the [place] FDA and has been authorized for detection and/or diagnosis of SARS-CoV-2 by FDA under an Emergency Use Authorization (EUA). This EUA will remain in effect (meaning this test can be used) for the duration of the COVID-19 declaration under Section 564(b)(1) of the Act, 21 U.S.C. section 360bbb-3(b)(1), unless the authorization is terminated or revoked.  Performed at [facility], [place] [address] [place], [place] [address]   Culture, blood (routine x 2)     Status: None (Preliminary result)   Collection Time: 07/07/21 12:35 AM   Specimen: BLOOD RIGHT WRIST  Result Value Ref Range Status   Specimen Description   Final  BLOOD RIGHT WRIST Performed at [facility], [place] [address], [place], [place] [address]    Special Requests   Final    BOTTLES DRAWN AEROBIC ONLY Blood Culture adequate volume Performed at [place] [address] [place], [place] [address]    Culture PENDING  Incomplete   Report Status PENDING  Incomplete  Culture, blood (routine x 2)     Status: None (Preliminary result)   Collection Time: 07/07/21 12:35 AM   Specimen: BLOOD RIGHT HAND  Result Value Ref Range Status   Specimen Description   Final    BLOOD RIGHT HAND Performed at [facility], [place] [address] [place], [place] [address]    Special Requests   Final    BOTTLES DRAWN AEROBIC ONLY Blood Culture adequate volume Performed at  [place] [address] [place], [place] [address]    Culture PENDING  Incomplete   Report Status PENDING  Incomplete      Studies:  CT ABDOMEN PELVIS W CONTRAST  Result Date: 07/06/2021 CLINICAL DATA:  Abdominal pain, acute, nonlocalized EXAM: CT ABDOMEN AND PELVIS WITH CONTRAST TECHNIQUE: Multidetector CT imaging of the abdomen and pelvis was performed using the standard protocol following bolus administration of intravenous contrast. CONTRAST:  165mL OMNIPAQUE IOHEXOL 300 MG/ML  SOLN COMPARISON:  Report from 10/07/2020. Images are unavailable at time of dictation. FINDINGS: Lower chest: No acute abnormality. Partially imaged left mastectomy. Hepatobiliary: 2 subcentimeter low-density hepatic lesions, which were present previously by report. Gallbladder is unremarkable. No biliary dilatation. Pancreas: Small subcentimeter low-density area near the junction of body and tail of pancreas was present previously but report. Spleen: Unremarkable. Adrenals/Urinary Tract: Approximally 1 cm left adrenal nodule was present on prior study by report. Right adrenals unremarkable. Few too small to characterize low-density renal lesions. Bladder is unremarkable. Stomach/Bowel: Stomach is within normal limits. Bowel is normal in caliber. Fluid and air within the colon. There is mild colonic wall thickening throughout with mild infiltration of adjacent fat. Normal appendix. Vascular/Lymphatic: Mild atherosclerosis.  No enlarged nodes. Reproductive: Anteverted with small calcified fibroids. No adnexal mass. Other: Small fat containing right inguinal hernia. Musculoskeletal: Lower lumbar spine degenerative changes. IMPRESSION: Abnormal appearance of colon likely reflecting infectious/inflammatory colitis. Images from prior study unavailable at time of dictation. Likely no additional substantial change in comparison to the prior study report. Electronically Signed   By: Macy Mis M.D.   On:  07/06/2021 18:30    Assessment/Plan: 61 y.o. 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  She is status post neoadjuvant chemotherapy mastectomy, maintenance pembrolizumab, and adjuvant radiation which is currently underway.  1.  Left-sided inflammatory breast cancer.  She is currently undergoing treatment with pembrolizumab and adjuvant radiation.  See #2 about the treatment plan for the pembrolizumab, she will continue the adjuvant radiation therapy.  2.  Diarrhea: Her C. difficile is negative.  The rest of the stool panel is pending.  In addition to Crohn's, we discussed that her diarrhea could be a colitis related to her Pembrolizumab therapy for her breast cancer.  Agree with GI evaluation regarding steroid taper once panel results.  It is unclear if we will be able to continue treatment with pembrolizumab  We discussed that it is positive however that she was able to get through a majority of her treatment.  I will defer to her primary treatment team in the GI specialists regarding her inpatient care.  We will follow-up with her once she is discharged about her cancer and cancer treatment.  We appreciate the great care that the hospitalists have provided to her.  The above was reviewed with Dr. Lindi Adie in detail who is in agreement.    Wilber Bihari, NP 07/07/21 8:28 PM Medical Oncology and Hematology [facility] [place], [place] [address] Tel. [phone]    Fax. [phone] .

## 2021-07-07 NOTE — Assessment & Plan Note (Deleted)
Sepsis due to colitis, POA: Patient presenting with fever, tachycardia, tachypnea and CT imaging findings consistent with colitis.  Differential includes infectious versus inflammatory versus medication associated colitis. -Started on empiric IV ceftriaxone and Flagyl -Follow C. difficile and GI pathogen panels -If C. difficile positive, discontinue current antibiotics and start on fidaxomicin or oral vancomycin -Give 1 L LR bolus followed by maintenance fluids overnight -Check lactic acid and blood cultures

## 2021-07-07 NOTE — Progress Notes (Signed)
PROGRESS NOTE    Rita Cole  ULA:[id number] DOB: 06/23/1961 DOA: 07/06/2021 PCP: Riki Sheer, NP    Chief Complaint  Patient presents with   Diarrhea   Nausea   Emesis    Brief Narrative:  Patient 61 year old female history of malignant left-sided breast cancer status post neoadjuvant chemotherapy, modified radical mastectomy, on active radiation therapy, pembrolizumab treatment, history of hypertension, recent diagnosis of Crohn's disease presenting to the ED with sepsis felt secondary to colitis.  Stools studies pending.  Patient placed empirically on IV antibiotics.  GI consulted.   Assessment & Plan:   Principal Problem:   Sepsis ([place]) Active Problems:   Malignant neoplasm of upper-outer quadrant of left breast in female, estrogen receptor negative ([place])   Colitis   Hypokalemia   Hypomagnesemia   Hypertension  #1 sepsis secondary to colitis, POA -Patient presented with sepsis felt secondary to colitis and met criteria for sepsis with fever, tachycardia, tachypnea, CT findings consistent with a colitis. -Concern for infectious versus inflammatory colitis. -C. difficile PCR negative. -GI pathogen panel pending. -Patient still with ongoing diarrhea, mucus stools, abdominal pain. -Leukocytosis trending down. -CRP ordered and elevated at 11.6. -Patient noted to have a fever 101.3 on presentation which has since improved. -Continue empiric IV Rocephin, IV Flagyl. -GI consulted for further evaluation and management -GI feel if stool studies are negative will consider trial of steroids. -Appreciate GI input and recommendations.  2.  Hypokalemia/hypomagnesemia -Likely secondary to GI losses. -Magnesium noted at 1.6 on presentation repleted currently at 2.0. -Potassium at 3.1. -K-Dur 40 mEq p.o. every 4 hours x2 doses.  3.  History of recently diagnosed Crohn's disease -Patient states diagnosed by GI specialist at [facility], Dr. Gerri Spore. -Was on mesalamine prior to admission currently on hold. -GI consulted and following.  4.  Hypertension -BP noted to be on the softer side secondary to hypovolemia. -Continue to hold antihypertensive medications. -Resume home regimen Toprol-XL.  5.  Malignant left-sided breast cancer -Being followed by oncology, Dr.Gudena, and radiation oncology Dr. Lisbeth Renshaw. -Status post neoadjuvant chemotherapy followed by mastectomy currently receiving radiation and immunotherapy with pembrolizumab. -Oncology informed via epic of patient's admission.   DVT prophylaxis: SCDs Code Status: Full Family Communication: Updated patient and daughter at bedside Disposition:   Status is: Inpatient  The patient will require care spanning > 2 midnights and should be moved to inpatient because: Severity of illness      Consultants:  Gastroenterology: Dr. Paulita Fujita 07/07/2021  Procedures CT abdomen pelvis 07/06/2021   Antimicrobials:  IV Rocephin 07/06/2021>>>>> IV Flagyl 07/06/2021>>>>   Subjective: Sitting up in bed.  Still with multiple watery loose stools.  Stated had some blood in it however describes stools as more golden/mucousy.  States whenever she drinks or eats comes right out.  Some abdominal pain around umbilical area.  No chest pain.  No shortness of breath.  Daughter at bedside.  Objective: Vitals:   07/06/21 2259 07/06/21 2303 07/07/21 0256 07/07/21 0612  BP: (!) 154/95 (!) 154/95 (!) 139/96 130/78  Pulse: (!) 107 (!) 105 91 82  Resp: _0 Temp: 100.2 F (37.9 C)  99.7 F (37.6 C) 98.6 F (37 C)  TempSrc: Oral  Oral Oral  SpO2: 94%  95% 95%  Weight:      Height:        Intake/Output Summary (Last 24 hours) at 07/07/2021 1215 Last data filed at 07/07/2021 0453 Gross per 24 hour  Intake 1794.25 ml  Output --  Net 1794.25 ml   Filed Weights   07/06/21 1524 07/06/21 2257  Weight: 84.6 kg 84.4 kg    Examination:  General exam: Appears calm and comfortable   Respiratory system: Clear to auscultation. Respiratory effort normal. Cardiovascular system: S1 & S2 heard, RRR. No JVD, murmurs, rubs, gallops or clicks. No pedal edema. Gastrointestinal system: Abdomen is nondistended, soft and some tenderness to palpation in the periumbilical region.  Positive bowel sounds.  No rebound.  No guarding. Central nervous system: Alert and oriented. No focal neurological deficits. Extremities: Symmetric 5 x 5 power. Skin: No rashes, lesions or ulcers Psychiatry: Judgement and insight appear normal. Mood & affect appropriate.     Data Reviewed: I have personally reviewed following labs and imaging studies  CBC: Recent Labs  Lab 07/06/21 1657 07/07/21 0035  WBC 11.4* 6.3  NEUTROABS 9.8*  --   HGB 12.4 10.9*  HCT 38.2 33.7*  MCV 91.8 91.6  PLT 217 409    Basic Metabolic Panel: Recent Labs  Lab 07/06/21 1657 07/07/21 0035  NA 139 135  K 2.9* 3.1*  CL 99 102  CO2 30 26  GLUCOSE 114* 127*  BUN 6 5*  CREATININE 0.96 0.95  CALCIUM 9.1 8.0*  MG 1.6* 2.0    GFR: Estimated Creatinine Clearance: 68.4 mL/min (by C-G formula based on SCr of 0.95 mg/dL).  Liver Function Tests: Recent Labs  Lab 07/06/21 1657  AST 18  ALT 13  ALKPHOS 52  BILITOT 0.5  PROT 7.6  ALBUMIN 3.9    CBG: No results for input(s): GLUCAP in the last 168 hours.   Recent Results (from the past 240 hour(s))  C Difficile Quick Screen w PCR reflex     Status: None   Collection Time: 07/06/21  4:57 PM   Specimen: Stool  Result Value Ref Range Status   C Diff antigen NEGATIVE NEGATIVE Final   C Diff toxin NEGATIVE NEGATIVE Final   C Diff interpretation No C. difficile detected.  Final    Comment: Performed at [facility], [place] [address], [place], [place] [address]  Resp Panel by RT-PCR (Flu A&B, Covid) Nasopharyngeal Swab     Status: None   Collection Time: 07/06/21  6:52 PM   Specimen: Nasopharyngeal Swab; Nasopharyngeal(NP) swabs in vial  transport medium  Result Value Ref Range Status   SARS Coronavirus 2 by RT PCR NEGATIVE NEGATIVE Final    Comment: (NOTE) SARS-CoV-2 target nucleic acids are NOT DETECTED.  The SARS-CoV-2 RNA is generally detectable in upper respiratory specimens during the acute phase of infection. The lowest concentration of SARS-CoV-2 viral copies this assay can detect is 138 copies/mL. A negative result does not preclude SARS-Cov-2 infection and should not be used as the sole basis for treatment or other patient management decisions. A negative result may occur with  improper specimen collection/handling, submission of specimen other than nasopharyngeal swab, presence of viral mutation(s) within the areas targeted by this assay, and inadequate number of viral copies(<138 copies/mL). A negative result must be combined with clinical observations, patient history, and epidemiological information. The expected result is Negative.  Fact Sheet for Patients:  EntrepreneurPulse.com.au  Fact Sheet for Healthcare Providers:  IncredibleEmployment.be  This test is no t yet approved or cleared by the [place] FDA and  has been authorized for detection and/or diagnosis of SARS-CoV-2 by FDA under an Emergency Use Authorization (EUA). This EUA will remain  in effect (meaning this test can be used) for  the duration of the COVID-19 declaration under Section 564(b)(1) of the Act, 21 U.S.C.section 360bbb-3(b)(1), unless the authorization is terminated  or revoked sooner.       Influenza A by PCR NEGATIVE NEGATIVE Final   Influenza B by PCR NEGATIVE NEGATIVE Final    Comment: (NOTE) The Xpert Xpress SARS-CoV-2/FLU/RSV plus assay is intended as an aid in the diagnosis of influenza from Nasopharyngeal swab specimens and should not be used as a sole basis for treatment. Nasal washings and aspirates are unacceptable for Xpert Xpress SARS-CoV-2/FLU/RSV testing.  Fact  Sheet for Patients: EntrepreneurPulse.com.au  Fact Sheet for Healthcare Providers: IncredibleEmployment.be  This test is not yet approved or cleared by the [place] FDA and has been authorized for detection and/or diagnosis of SARS-CoV-2 by FDA under an Emergency Use Authorization (EUA). This EUA will remain in effect (meaning this test can be used) for the duration of the COVID-19 declaration under Section 564(b)(1) of the Act, 21 U.S.C. section 360bbb-3(b)(1), unless the authorization is terminated or revoked.  Performed at [facility], [place] [address], [place], [place] [address]   Culture, blood (routine x 2)     Status: None (Preliminary result)   Collection Time: 07/07/21 12:35 AM   Specimen: BLOOD RIGHT WRIST  Result Value Ref Range Status   Specimen Description   Final    BLOOD RIGHT WRIST Performed at [place] [address], [place], [place] [address]    Special Requests   Final    BOTTLES DRAWN AEROBIC ONLY Blood Culture adequate volume Performed at [place] [address] [place], [place] [address]    Culture PENDING  Incomplete   Report Status PENDING  Incomplete  Culture, blood (routine x 2)     Status: None (Preliminary result)   Collection Time: 07/07/21 12:35 AM   Specimen: BLOOD RIGHT HAND  Result Value Ref Range Status   Specimen Description   Final    BLOOD RIGHT HAND Performed at [facility], [place] [address] [place], [place] [address]    Special Requests   Final    BOTTLES DRAWN AEROBIC ONLY Blood Culture adequate volume Performed at [place] [address], [place], [place] [address]    Culture PENDING  Incomplete   Report Status PENDING  Incomplete         Radiology Studies: CT ABDOMEN PELVIS W CONTRAST  Result Date: 07/06/2021 CLINICAL DATA:  Abdominal pain, acute, nonlocalized EXAM: CT ABDOMEN AND PELVIS WITH  CONTRAST TECHNIQUE: Multidetector CT imaging of the abdomen and pelvis was performed using the standard protocol following bolus administration of intravenous contrast. CONTRAST:  158m OMNIPAQUE IOHEXOL 300 MG/ML  SOLN COMPARISON:  Report from 10/07/2020. Images are unavailable at time of dictation. FINDINGS: Lower chest: No acute abnormality. Partially imaged left mastectomy. Hepatobiliary: 2 subcentimeter low-density hepatic lesions, which were present previously by report. Gallbladder is unremarkable. No biliary dilatation. Pancreas: Small subcentimeter low-density area near the junction of body and tail of pancreas was present previously but report. Spleen: Unremarkable. Adrenals/Urinary Tract: Approximally 1 cm left adrenal nodule was present on prior study by report. Right adrenals unremarkable. Few too small to characterize low-density renal lesions. Bladder is unremarkable. Stomach/Bowel: Stomach is within normal limits. Bowel is normal in caliber. Fluid and air within the colon. There is mild colonic wall thickening throughout with mild infiltration of adjacent fat. Normal appendix. Vascular/Lymphatic: Mild atherosclerosis.  No enlarged nodes. Reproductive: Anteverted with small calcified fibroids. No  adnexal mass. Other: Small fat containing right inguinal hernia. Musculoskeletal: Lower lumbar spine degenerative changes. IMPRESSION: Abnormal appearance of colon likely reflecting infectious/inflammatory colitis. Images from prior study unavailable at time of dictation. Likely no additional substantial change in comparison to the prior study report. Electronically Signed   By: Macy Mis M.D.   On: 07/06/2021 18:30        Scheduled Meds:  metoprolol succinate  50 mg Oral Daily   potassium chloride  40 mEq Oral Q4H   Continuous Infusions:  sodium chloride 100 mL/hr at 07/07/21 1016   cefTRIAXone (ROCEPHIN)  IV     metronidazole 500 mg (07/07/21 0615)     LOS: 0 days    Time spent: 40  minutes    Irine Seal, MD [facility]   To contact the attending provider between 7A-7P or the covering provider during after hours 7P-7A, please log into the web site [email or web address] and access using universal [place] password for that web site. If you do not have the password, please call the hospital operator.  07/07/2021, 12:15 PM

## 2021-07-07 NOTE — Assessment & Plan Note (Deleted)
hypokalemia  Magnesium sulfate 4 g IV x1. K. Dur 40 mEq p.o. every 4 hours x2 doses.

## 2021-07-07 NOTE — Plan of Care (Signed)
°  Problem: Clinical Measurements: Goal: Diagnostic test results will improve Outcome: Progressing   Problem: Clinical Measurements: Goal: Ability to maintain clinical measurements within normal limits will improve Outcome: Progressing

## 2021-07-07 NOTE — Consult Note (Signed)
Referring Provider: [facility] Primary Care Physician:  Riki Sheer, NP Primary Gastroenterologist:  Althia Forts  Reason for Consultation:  Colitis  HPI: Rita Cole is a 61 y.o. female with medical history significant for malignant left-sided breast cancer (s/p neoadjuvant chemotherapy, modified radical mastectomy, on active radiation therapy and pembrolizumab treatment), hypertension, and reported recent diagnosis of Crohn's disease presents for colitis  Originally presented to ED for evaluation of abdominal pain with diarrhea, nausea, and vomiting. Patient states she has had loose bloody stools ongoing since early December. She was seen by her GI Dr. Gerri Spore with [facility] and was put on Mesalamine 1.2g BID December 14th and has been taking benefiber daily. She states her GI informed her it may take 6 weeks for medication to work, but she has seen no improvement. She is having 9+ Bms per day, often with bright red blood in the stool and on the tissue paper. Prior to December she was having soft, relatively formed Bms. Daughter brought her to ED yesterday because she had 6-9 episodes of vomiting. Denies hematemesis. Patient also reports epigastric burning/gnawing pain that hurts worse when she hasn't eaten. She has struggled to keep liquids down. Denies NSAID, alcohol, or tobacco use.  Last colonoscopy was April 2022 with Dr. Eber Jones: patient reports there were benign polyps and she states at this time she was diagnosed with Crohn's colitis. Colonoscopy was done for screening as she was having no issues at the time.  Her last radiation treatment was on 1/6. Her last Keytruda infusion was on 12/30  Past Medical History:  Diagnosis Date   Breast cancer [facility])    Family history of lung cancer 10/02/2020   Hypertension    Pre-diabetes     Past Surgical History:  Procedure Laterality Date   BREAST SURGERY     BUNIONECTOMY     IR IMAGING GUIDED PORT INSERTION  10/14/2020    MODIFIED MASTECTOMY Left 05/14/2021   Procedure: LEFT MODIFIED RADICAL MASTECTOMY;  Surgeon: Rolm Bookbinder, MD;  Location: [facility];  Service: General;  Laterality: Left;   MYOMECTOMY     TONSILLECTOMY     as a child    Prior to Admission medications   Medication Sig Start Date End Date Taking? Authorizing Provider  acetaminophen (TYLENOL) 500 MG tablet Take 500-1,000 mg by mouth every 6 (six) hours as needed for mild pain, headache or fever.   Yes [provider]  amLODipine (NORVASC) 10 MG tablet Take 1 tablet (10 mg total) by mouth daily. 03/31/21  Yes Nicholas Lose, MD  calcium carbonate (TUMS - DOSED IN MG ELEMENTAL CALCIUM) 500 MG chewable tablet Chew 1-2 tablets by mouth as needed for indigestion or heartburn.   Yes [provider]  hydroxypropyl methylcellulose / hypromellose (ISOPTO TEARS / GONIOVISC) 2.5 % ophthalmic solution Place 1 drop into both eyes 3 (three) times daily as needed for dry eyes.   Yes [provider]  lidocaine-prilocaine (EMLA) cream Apply 1 application topically as directed. 05/28/21  Yes [provider]  losartan (COZAAR) 100 MG tablet Take 100 mg by mouth daily. 02/26/21  Yes [provider]  mesalamine (LIALDA) 1.2 g EC tablet Take 1.2 g by mouth 2 (two) times daily. 06/11/21  Yes [provider]  metoprolol succinate (TOPROL-XL) 50 MG 24 hr tablet Take 50 mg by mouth daily. 02/26/21  Yes [provider]  Pembrolizumab (KEYTRUDA IV) Inject 200 mg into the vein See admin instructions. pembrolizumab (KEYTRUDA) 200 mg in sodium chloride  0.9 % 50 mL chemo infusion- Once @ 116 mL/hr over 30 Minutes   Yes [provider]  triamterene-hydrochlorothiazide (MAXZIDE-25) 37.5-25 MG tablet Take 1 tablet by mouth daily. 02/26/21  Yes [provider]  Vitamin D, Ergocalciferol, (DRISDOL) 1.25 MG (50000 UNIT) CAPS capsule Take 50,000 Units by mouth every Tuesday. 08/19/20  Yes [provider]   Wheat Dextrin (BENEFIBER) POWD Take by mouth See admin instructions. Mix 2 teaspoonful of powder into 4-8 ounces of a desired beverage and drink once a day   Yes [provider]  pyridoxine (B-6) 100 MG tablet Take 100 mg by mouth daily. Patient not taking: Reported on 07/06/2021    [provider]  cetirizine (ZYRTEC ALLERGY) 10 MG tablet Take 1 tablet (10 mg total) by mouth daily. Patient not taking: Reported on 09/06/2020 05/29/17 09/06/20  Jaynee Eagles, PA-C  prochlorperazine (COMPAZINE) 10 MG tablet Take 1 tablet (10 mg total) by mouth every 6 (six) hours as needed (Nausea or vomiting). 10/01/20 05/27/21  Nicholas Lose, MD    Scheduled Meds:  metoprolol succinate  50 mg Oral Daily   potassium chloride  40 mEq Oral Q4H   Continuous Infusions:  sodium chloride     cefTRIAXone (ROCEPHIN)  IV     metronidazole 500 mg (07/07/21 0615)   PRN Meds:.acetaminophen **OR** acetaminophen, morphine injection, ondansetron (ZOFRAN) IV, polyvinyl alcohol  Allergies as of 07/06/2021 - Review Complete 07/06/2021  Allergen Reaction Noted   Banana  07/06/2021    Family History  Problem Relation Age of Onset   Heart attack Father    Lung cancer Father        dx unknown age   63 Other        MGM's mother; unknown type; dx unknown age    Social History   Socioeconomic History   Marital status: Divorced    Spouse name: Not on file   Number of children: Not on file   Years of education: Not on file   Highest education level: Not on file  Occupational History   Not on file  Tobacco Use   Smoking status: Former    Packs/day: 0.25    Types: Cigarettes   Smokeless tobacco: Never  Vaping Use   Vaping Use: Never used  Substance and Sexual Activity   Alcohol use: No   Drug use: No   Sexual activity: Yes  Other Topics Concern   Not on file  Social History Narrative   Not on file   Social Determinants of Health   Financial Resource Strain: Not on file  Food Insecurity:  Not on file  Transportation Needs: Not on file  Physical Activity: Not on file  Stress: Not on file  Social Connections: Not on file  Intimate Partner Violence: Not on file    Review of Systems: Review of Systems  Constitutional:  Negative for chills and fever.  HENT:  Negative for ear pain and hearing loss.   Eyes:  Negative for blurred vision and double vision.  Respiratory:  Negative for cough and hemoptysis.   Cardiovascular:  Negative for chest pain and palpitations.  Gastrointestinal:  Positive for abdominal pain, blood in stool, diarrhea, nausea and vomiting. Negative for constipation, heartburn and melena.  Genitourinary:  Negative for dysuria and urgency.  Musculoskeletal:  Negative for myalgias and neck pain.  Skin:  Negative for itching and rash.  Neurological:  Negative for seizures and loss of consciousness.  Psychiatric/Behavioral:  Negative for substance abuse. The patient is  not nervous/anxious.     Physical Exam:Physical Exam Constitutional:      Appearance: Normal appearance.  HENT:     Head: Normocephalic and atraumatic.     Nose: Nose normal. No congestion.     Mouth/Throat:     Mouth: Mucous membranes are moist.     Pharynx: Oropharynx is clear.  Eyes:     Extraocular Movements: Extraocular movements intact.     Conjunctiva/sclera: Conjunctivae normal.  Cardiovascular:     Rate and Rhythm: Normal rate and regular rhythm.  Pulmonary:     Effort: Pulmonary effort is normal.     Breath sounds: Normal breath sounds.  Abdominal:     General: Abdomen is flat. Bowel sounds are normal. There is no distension.     Palpations: Abdomen is soft. There is no mass.     Tenderness: There is no abdominal tenderness. There is no guarding or rebound.     Hernia: No hernia is present.  Musculoskeletal:        General: No swelling. Normal range of motion.     Cervical back: Normal range of motion and neck supple.  Skin:    General: Skin is warm and dry.  Neurological:      General: No focal deficit present.     Mental Status: She is alert and oriented to person, place, and time.  Psychiatric:        Mood and Affect: Mood normal.        Behavior: Behavior normal.        Thought Content: Thought content normal.        Judgment: Judgment normal.    Vital signs: Vitals:   07/07/21 0256 07/07/21 0612  BP: (!) 139/96 130/78  Pulse: 91 82  Resp: 18 16  Temp: 99.7 F (37.6 C) 98.6 F (37 C)  SpO2: 95% 95%   Last BM Date: 07/06/21    GI:  Lab Results: Recent Labs    07/06/21 1657 07/07/21 0035  WBC 11.4* 6.3  HGB 12.4 10.9*  HCT 38.2 33.7*  PLT 217 181   BMET Recent Labs    07/06/21 1657 07/07/21 0035  NA 139 135  K 2.9* 3.1*  CL 99 102  CO2 30 26  GLUCOSE 114* 127*  BUN 6 5*  CREATININE 0.96 0.95  CALCIUM 9.1 8.0*   LFT Recent Labs    07/06/21 1657  PROT 7.6  ALBUMIN 3.9  AST 18  ALT 13  ALKPHOS 52  BILITOT 0.5   PT/INR No results for input(s): LABPROT, INR in the last 72 hours.   Studies/Results: CT ABDOMEN PELVIS W CONTRAST  Result Date: 07/06/2021 CLINICAL DATA:  Abdominal pain, acute, nonlocalized EXAM: CT ABDOMEN AND PELVIS WITH CONTRAST TECHNIQUE: Multidetector CT imaging of the abdomen and pelvis was performed using the standard protocol following bolus administration of intravenous contrast. CONTRAST:  187mL OMNIPAQUE IOHEXOL 300 MG/ML  SOLN COMPARISON:  Report from 10/07/2020. Images are unavailable at time of dictation. FINDINGS: Lower chest: No acute abnormality. Partially imaged left mastectomy. Hepatobiliary: 2 subcentimeter low-density hepatic lesions, which were present previously by report. Gallbladder is unremarkable. No biliary dilatation. Pancreas: Small subcentimeter low-density area near the junction of body and tail of pancreas was present previously but report. Spleen: Unremarkable. Adrenals/Urinary Tract: Approximally 1 cm left adrenal nodule was present on prior study by report. Right adrenals  unremarkable. Few too small to characterize low-density renal lesions. Bladder is unremarkable. Stomach/Bowel: Stomach is within normal limits. Bowel is normal in  caliber. Fluid and air within the colon. There is mild colonic wall thickening throughout with mild infiltration of adjacent fat. Normal appendix. Vascular/Lymphatic: Mild atherosclerosis.  No enlarged nodes. Reproductive: Anteverted with small calcified fibroids. No adnexal mass. Other: Small fat containing right inguinal hernia. Musculoskeletal: Lower lumbar spine degenerative changes. IMPRESSION: Abnormal appearance of colon likely reflecting infectious/inflammatory colitis. Images from prior study unavailable at time of dictation. Likely no additional substantial change in comparison to the prior study report. Electronically Signed   By: Macy Mis M.D.   On: 07/06/2021 18:30    Impression: Colitis; Crohn's flare up vs radiation induced. - hgb 10.9 - No leukocytosis - K 3.1 - GI pathogen panel pending, negative C. Diff - CT abdomen pelvis with contrast 1/9: mild colonic wall thickening throughout, likely infectious vs inflammatory colitis.  Epigastric pain - Lipase normal  Plan: Suspect epigastric pain could be reflux related, can do trial of PPI. Will await pathogen panel results, if negative recommend steroid management and follow up with GI outpatient. Continue supportive care [facility] will follow   LOS: 0 days   Ahsha Hinsley Radford Pax  PA-C 07/07/2021, 9:59 AM  Contact #  [phone]

## 2021-07-08 ENCOUNTER — Ambulatory Visit
Admission: RE | Admit: 2021-07-08 | Discharge: 2021-07-08 | Disposition: A | Discharge status: Home / Self Care | Payer: Federal, State, Local not specified - PPO | Source: Ambulatory Visit | Attending: Radiation Oncology | Admitting: Radiation Oncology

## 2021-07-08 DIAGNOSIS — K529 Noninfective gastroenteritis and colitis, unspecified: Secondary | ICD-10-CM | POA: Diagnosis not present

## 2021-07-08 DIAGNOSIS — E876 Hypokalemia: Secondary | ICD-10-CM | POA: Diagnosis not present

## 2021-07-08 LAB — CBC WITH DIFFERENTIAL/PLATELET
Abs Immature Granulocytes: 0.02 10*3/uL (ref 0.00–0.07)
Basophils Absolute: 0.1 10*3/uL (ref 0.0–0.1)
Basophils Relative: 1 %
Eosinophils Absolute: 0.4 10*3/uL (ref 0.0–0.5)
Eosinophils Relative: 7 %
HCT: 29.3 % — ABNORMAL LOW (ref 36.0–46.0)
Hemoglobin: 9.3 g/dL — ABNORMAL LOW (ref 12.0–15.0)
Immature Granulocytes: 0 %
Lymphocytes Relative: 11 %
Lymphs Abs: 0.7 10*3/uL (ref 0.7–4.0)
MCH: 29.5 pg (ref 26.0–34.0)
MCHC: 31.7 g/dL (ref 30.0–36.0)
MCV: 93 fL (ref 80.0–100.0)
Monocytes Absolute: 1.2 10*3/uL — ABNORMAL HIGH (ref 0.1–1.0)
Monocytes Relative: 20 %
Neutro Abs: 3.6 10*3/uL (ref 1.7–7.7)
Neutrophils Relative %: 61 %
Platelets: 164 10*3/uL (ref 150–400)
RBC: 3.15 MIL/uL — ABNORMAL LOW (ref 3.87–5.11)
RDW: 15.5 % (ref 11.5–15.5)
WBC: 5.9 10*3/uL (ref 4.0–10.5)
nRBC: 0 % (ref 0.0–0.2)

## 2021-07-08 LAB — RENAL FUNCTION PANEL
Albumin: 2.7 g/dL — ABNORMAL LOW (ref 3.5–5.0)
Anion gap: 8 (ref 5–15)
BUN: <5 mg/dL — ABNORMAL LOW (ref 6–20)
CO2: 26 mmol/L (ref 22–32)
Calcium: 7.8 mg/dL — ABNORMAL LOW (ref 8.9–10.3)
Chloride: 102 mmol/L (ref 98–111)
Creatinine, Ser: 0.63 mg/dL (ref 0.44–1.00)
GFR, Estimated: >60 mL/min (ref >60)
Glucose, Bld: 95 mg/dL (ref 70–99)
Phosphorus: 2.3 mg/dL — ABNORMAL LOW (ref 2.5–4.6)
Potassium: 3.4 mmol/L — ABNORMAL LOW (ref 3.5–5.1)
Sodium: 136 mmol/L (ref 135–145)

## 2021-07-08 LAB — MAGNESIUM: Magnesium: 1.6 mg/dL — ABNORMAL LOW (ref 1.7–2.4)

## 2021-07-08 MED ORDER — POTASSIUM CHLORIDE 20 MEQ PO PACK
40.0000 meq | PACK | Freq: Once | ORAL | Status: AC
  Administered 2021-07-08: 40 meq via ORAL
  Filled 2021-07-08: qty 2

## 2021-07-08 MED ORDER — MAGNESIUM SULFATE 2 GM/50ML IV SOLN
2.0000 g | Freq: Once | INTRAVENOUS | Status: AC
  Administered 2021-07-08: 2 g via INTRAVENOUS
  Filled 2021-07-08: qty 50

## 2021-07-08 NOTE — Progress Notes (Signed)
°  Transition of Care (TOC) Screening Note   Patient Details  Name: Rita Cole Date of Birth: [birth year]   Transition of Care [facility]) CM/SW Contact:    Ruthvik Barnaby, Marjie Skiff, RN Phone Number: 07/08/2021, 12:53 PM    Transition of Care Department [facility]) has reviewed patient and no TOC needs have been identified at this time. We will continue to monitor patient advancement through interdisciplinary progression rounds. If new patient transition needs arise, please place a TOC consult.

## 2021-07-08 NOTE — Progress Notes (Signed)
Followed up with lab regarding GI panel specimen who stated they do not have evidence of receiving the sample. Requested another stool sample from patient. Dr. Wyline Copas and Dr. Paulita Fujita notified via secure chat.

## 2021-07-08 NOTE — Progress Notes (Signed)
[facility] Progress Note  Rita Cole 61 y.o. [birth year]  CC:  Colitis   Subjective: Patient states she had about 10 loose BMs last night.  States there is no blood in her bowel movements last night, though has had 3 loose bloody bowel movements this morning.  Reports burning in her abdomen and feels like her intestines are on fire.  Would really like to try to eat something if possible.  Denies nausea/vomiting.  ROS : Review of Systems  Gastrointestinal:  Positive for abdominal pain and blood in stool. Negative for constipation, diarrhea, heartburn, nausea and vomiting.  Genitourinary:  Negative for dysuria and urgency.     Objective: Vital signs in last 24 hours: Vitals:   07/07/21 2137 07/08/21 0516  BP: (!) 127/93 101/89  Pulse: 68 68  Resp: 17 18  Temp: 98.5 F (36.9 C) 98.6 F (37 C)  SpO2: 100% 100%    Physical Exam:  General:  Alert, cooperative, no distress, appears stated age  Head:  Normocephalic, without obvious abnormality, atraumatic  Eyes:  Anicteric sclera, EOM's intact  Lungs:   Clear to auscultation bilaterally, respirations unlabored  Heart:  Regular rate and rhythm, S1, S2 normal  Abdomen:   Soft, non-tender, bowel sounds active all four quadrants,  no masses,     Lab Results: Recent Labs    07/07/21 0035 07/08/21 0500  NA 135 136  K 3.1* 3.4*  CL 102 102  CO2 26 26  GLUCOSE 127* 95  BUN 5* <5*  CREATININE 0.95 0.63  CALCIUM 8.0* 7.8*  MG 2.0 1.6*  PHOS  --  2.3*   Recent Labs    07/06/21 1657 07/08/21 0500  AST 18  --   ALT 13  --   ALKPHOS 52  --   BILITOT 0.5  --   PROT 7.6  --   ALBUMIN 3.9 2.7*   Recent Labs    07/06/21 1657 07/07/21 0035 07/08/21 0500  WBC 11.4* 6.3 5.9  NEUTROABS 9.8*  --  3.6  HGB 12.4 10.9* 9.3*  HCT 38.2 33.7* 29.3*  MCV 91.8 91.6 93.0  PLT 217 181 164   No results for input(s): LABPROT, INR in the last 72 hours.    Assessment Colitis; Crohn's flare up vs radiation  induced. - hgb 9.3 (10.9 yesterday) - No leukocytosis - K 3.4 - CRP 11.6 - GI pathogen panel pending, negative C. Diff - CT abdomen pelvis with contrast 1/9: mild colonic wall thickening throughout, likely infectious vs inflammatory colitis.   Epigastric pain - Lipase normal   Plan: Pathogen panel still pending, if negative will move forward with steroid management Continue supportive care [facility] will follow  Garnette Scheuermann PA-C 07/08/2021, 10:43 AM  Contact #  [phone]

## 2021-07-08 NOTE — Progress Notes (Signed)
PROGRESS NOTE    Rita Cole  WGN:[id number] DOB: December 19, 1960 DOA: 07/06/2021 PCP: Riki Sheer, NP    Brief Narrative:  61 year old female history of malignant left-sided breast cancer status post neoadjuvant chemotherapy, modified radical mastectomy, on active radiation therapy, pembrolizumab treatment, history of hypertension, recent diagnosis of Crohn's disease presenting to the ED with sepsis felt secondary to colitis.  Stools studies pending.  Patient placed empirically on IV antibiotics.  GI consulted.  Assessment & Plan:   Principal Problem:   Sepsis ([place]) Active Problems:   Malignant neoplasm of upper-outer quadrant of left breast in female, estrogen receptor negative ([place])   Port-A-Cath in place   S/P mastectomy, left   Colitis   Hypokalemia   Hypomagnesemia   Hypertension   #1 sepsis secondary to colitis, POA -Patient presented with sepsis felt secondary to colitis and met criteria for sepsis with fever, tachycardia, tachypnea, CT findings consistent with a colitis. -Concern for infectious versus inflammatory colitis. -C. difficile PCR negative. -GI pathogen panel remains pending -Patient still with ongoing diarrhea, mucus stools, abdominal pain. -Leukocytosis trending down. -CRP ordered and elevated at 11.6. -Patient noted to have a fever 101.3 on presentation which has since improved. -Continue empiric IV Rocephin, IV Flagyl. -GI consulted for further evaluation and management -GI feel if stool studies are negative will consider trial of steroids. -GI following  2.  Hypokalemia/hypomagnesemia -Likely secondary to GI losses. -K of 3.4 with Mg 1.6. Have replaced -Cont to follow lytes  3.  History of recently diagnosed Crohn's disease -Patient states diagnosed by GI specialist at [facility], Dr. Gerri Spore. -Was on mesalamine prior to admission currently on hold. -GI is following  4.  Hypertension -BP noted to be on the softer side  secondary to hypovolemia. -Continue to hold antihypertensive medications. -Resume home regimen Toprol-XL.  5.  Malignant left-sided breast cancer -Being followed by oncology, Dr.Gudena, and radiation oncology Dr. Lisbeth Renshaw. -Status post neoadjuvant chemotherapy followed by mastectomy currently receiving radiation and immunotherapy with pembrolizumab. -Oncology was made aware of pt's admit    DVT prophylaxis: SCD's Code Status: Full Family Communication: Pt in room, family at bedside  Status is: Inpatient  Remains inpatient appropriate because: Severity of illness    Consultants:  GI  Procedures:    Antimicrobials: Anti-infectives (From admission, onward)    Start     Dose/Rate Route Frequency Ordered Stop   07/07/21 2000  cefTRIAXone (ROCEPHIN) 2 g in sodium chloride 0.9 % 100 mL IVPB        2 g 200 mL/hr over 30 Minutes Intravenous Every 24 hours 07/06/21 2114     07/06/21 1900  cefTRIAXone (ROCEPHIN) 1 g in sodium chloride 0.9 % 100 mL IVPB        1 g 200 mL/hr over 30 Minutes Intravenous  Once 07/06/21 1851 07/06/21 2147   07/06/21 1900  metroNIDAZOLE (FLAGYL) IVPB 500 mg        500 mg 100 mL/hr over 60 Minutes Intravenous Every 12 hours 07/06/21 1851         Subjective: Eager to advance diet today  Objective: Vitals:   07/07/21 1405 07/07/21 2137 07/08/21 0516 07/08/21 1333  BP: (!) 125/93 (!) 127/93 101/89 (!) 135/97  Pulse: 72 68 68 61  Resp: _0 Temp: 98.8 F (37.1 C) 98.5 F (36.9 C) 98.6 F (37 C) 97.7 F (36.5 C)  TempSrc: Oral Oral Oral Oral  SpO2: 98% 100% 100% 100%  Weight:  Height:        Intake/Output Summary (Last 24 hours) at 07/08/2021 1856 Last data filed at 07/08/2021 1850 Gross per 24 hour  Intake 2629.99 ml  Output --  Net 2629.99 ml   Filed Weights   07/06/21 1524 07/06/21 2257  Weight: 84.6 kg 84.4 kg    Examination: General exam: Awake, laying in bed, in nad Respiratory system: Normal respiratory effort, no  wheezing Cardiovascular system: regular rate, s1, s2 Gastrointestinal system: Soft, nondistended, positive BS Central nervous system: CN2-12 grossly intact, strength intact Extremities: Perfused, no clubbing Skin: Normal skin turgor, no notable skin lesions seen Psychiatry: Mood normal // no visual hallucinations   Data Reviewed: I have personally reviewed following labs and imaging studies  CBC: Recent Labs  Lab 07/06/21 1657 07/07/21 0035 07/08/21 0500  WBC 11.4* 6.3 5.9  NEUTROABS 9.8*  --  3.6  HGB 12.4 10.9* 9.3*  HCT 38.2 33.7* 29.3*  MCV 91.8 91.6 93.0  PLT 217 181 678   Basic Metabolic Panel: Recent Labs  Lab 07/06/21 1657 07/07/21 0035 07/08/21 0500  NA 139 135 136  K 2.9* 3.1* 3.4*  CL 99 102 102  CO2 _0 GLUCOSE 114* 127* 95  BUN 6 5* <5*  CREATININE 0.96 0.95 0.63  CALCIUM 9.1 8.0* 7.8*  MG 1.6* 2.0 1.6*  PHOS  --   --  2.3*   GFR: Estimated Creatinine Clearance: 81.3 mL/min (by C-G formula based on SCr of 0.63 mg/dL). Liver Function Tests: Recent Labs  Lab 07/06/21 1657 07/08/21 0500  AST 18  --   ALT 13  --   ALKPHOS 52  --   BILITOT 0.5  --   PROT 7.6  --   ALBUMIN 3.9 2.7*   Recent Labs  Lab 07/06/21 1657  LIPASE 25   No results for input(s): AMMONIA in the last 168 hours. Coagulation Profile: No results for input(s): INR, PROTIME in the last 168 hours. Cardiac Enzymes: No results for input(s): CKTOTAL, CKMB, CKMBINDEX, TROPONINI in the last 168 hours. BNP (last 3 results) No results for input(s): PROBNP in the last 8760 hours. HbA1C: No results for input(s): HGBA1C in the last 72 hours. CBG: No results for input(s): GLUCAP in the last 168 hours. Lipid Profile: No results for input(s): CHOL, HDL, LDLCALC, TRIG, CHOLHDL, LDLDIRECT in the last 72 hours. Thyroid Function Tests: No results for input(s): TSH, T4TOTAL, FREET4, T3FREE, THYROIDAB in the last 72 hours. Anemia Panel: No results for input(s): VITAMINB12, FOLATE,  FERRITIN, TIBC, IRON, RETICCTPCT in the last 72 hours. Sepsis Labs: Recent Labs  Lab 07/07/21 0035  PROCALCITON <0.10  LATICACIDVEN 1.7    Recent Results (from the past 240 hour(s))  C Difficile Quick Screen w PCR reflex     Status: None   Collection Time: 07/06/21  4:57 PM   Specimen: Stool  Result Value Ref Range Status   C Diff antigen NEGATIVE NEGATIVE Final   C Diff toxin NEGATIVE NEGATIVE Final   C Diff interpretation No C. difficile detected.  Final    Comment: Performed at [facility], [place] [address] [place], [place] [address]  Resp Panel by RT-PCR (Flu A&B, Covid) Nasopharyngeal Swab     Status: None   Collection Time: 07/06/21  6:52 PM   Specimen: Nasopharyngeal Swab; Nasopharyngeal(NP) swabs in vial transport medium  Result Value Ref Range Status   SARS Coronavirus 2 by RT PCR NEGATIVE NEGATIVE Final    Comment: (NOTE) SARS-CoV-2 target nucleic  acids are NOT DETECTED.  The SARS-CoV-2 RNA is generally detectable in upper respiratory specimens during the acute phase of infection. The lowest concentration of SARS-CoV-2 viral copies this assay can detect is 138 copies/mL. A negative result does not preclude SARS-Cov-2 infection and should not be used as the sole basis for treatment or other patient management decisions. A negative result may occur with  improper specimen collection/handling, submission of specimen other than nasopharyngeal swab, presence of viral mutation(s) within the areas targeted by this assay, and inadequate number of viral copies(<138 copies/mL). A negative result must be combined with clinical observations, patient history, and epidemiological information. The expected result is Negative.  Fact Sheet for Patients:  EntrepreneurPulse.com.au  Fact Sheet for Healthcare Providers:  IncredibleEmployment.be  This test is no t yet approved or cleared by the [place] FDA and  has been  authorized for detection and/or diagnosis of SARS-CoV-2 by FDA under an Emergency Use Authorization (EUA). This EUA will remain  in effect (meaning this test can be used) for the duration of the COVID-19 declaration under Section 564(b)(1) of the Act, 21 U.S.C.section 360bbb-3(b)(1), unless the authorization is terminated  or revoked sooner.       Influenza A by PCR NEGATIVE NEGATIVE Final   Influenza B by PCR NEGATIVE NEGATIVE Final    Comment: (NOTE) The Xpert Xpress SARS-CoV-2/FLU/RSV plus assay is intended as an aid in the diagnosis of influenza from Nasopharyngeal swab specimens and should not be used as a sole basis for treatment. Nasal washings and aspirates are unacceptable for Xpert Xpress SARS-CoV-2/FLU/RSV testing.  Fact Sheet for Patients: EntrepreneurPulse.com.au  Fact Sheet for Healthcare Providers: IncredibleEmployment.be  This test is not yet approved or cleared by the [place] FDA and has been authorized for detection and/or diagnosis of SARS-CoV-2 by FDA under an Emergency Use Authorization (EUA). This EUA will remain in effect (meaning this test can be used) for the duration of the COVID-19 declaration under Section 564(b)(1) of the Act, 21 U.S.C. section 360bbb-3(b)(1), unless the authorization is terminated or revoked.  Performed at [facility], [place] [address], [place], [place] [address]   Culture, blood (routine x 2)     Status: None (Preliminary result)   Collection Time: 07/07/21 12:35 AM   Specimen: BLOOD RIGHT WRIST  Result Value Ref Range Status   Specimen Description   Final    BLOOD RIGHT WRIST Performed at [place] [address], [place], [place] [address]    Special Requests   Final    BOTTLES DRAWN AEROBIC ONLY Blood Culture adequate volume Performed at [place] [address], [place], [place] [address]    Culture   Final    NO GROWTH 1  DAY Performed at [facility], [place] [address], [place], [place] [address]    Report Status PENDING  Incomplete  Culture, blood (routine x 2)     Status: None (Preliminary result)   Collection Time: 07/07/21 12:35 AM   Specimen: BLOOD RIGHT HAND  Result Value Ref Range Status   Specimen Description   Final    BLOOD RIGHT HAND Performed at [facility], [place] [address], [place], [place] [address]    Special Requests   Final    BOTTLES DRAWN AEROBIC ONLY Blood Culture adequate volume Performed at [place] [address], [place], [place] [address]    Culture   Final    NO GROWTH 1 DAY Performed at  [facility], [place] [address], [place], [place] [address]    Report Status PENDING  Incomplete     Radiology Studies: No results found.  Scheduled Meds:  metoprolol succinate  50 mg Oral Daily   Continuous Infusions:  sodium chloride 100 mL/hr at 07/08/21 1832   cefTRIAXone (ROCEPHIN)  IV 2 g (07/07/21 2102)   metronidazole 500 mg (07/08/21 1833)     LOS: 1 day   Marylu Lund, MD [facility] Pager On Amion  If 7PM-7AM, please contact night-coverage 07/08/2021, 6:56 PM

## 2021-07-09 ENCOUNTER — Ambulatory Visit
Admission: RE | Admit: 2021-07-09 | Discharge: 2021-07-09 | Disposition: A | Discharge status: Home / Self Care | Payer: Federal, State, Local not specified - PPO | Source: Ambulatory Visit | Attending: Radiation Oncology | Admitting: Radiation Oncology

## 2021-07-09 ENCOUNTER — Encounter: Payer: Self-pay | Admitting: Radiation Oncology

## 2021-07-09 DIAGNOSIS — E876 Hypokalemia: Secondary | ICD-10-CM | POA: Diagnosis not present

## 2021-07-09 LAB — CBC
HCT: 30.7 % — ABNORMAL LOW (ref 36.0–46.0)
Hemoglobin: 9.6 g/dL — ABNORMAL LOW (ref 12.0–15.0)
MCH: 29.2 pg (ref 26.0–34.0)
MCHC: 31.3 g/dL (ref 30.0–36.0)
MCV: 93.3 fL (ref 80.0–100.0)
Platelets: 175 10*3/uL (ref 150–400)
RBC: 3.29 MIL/uL — ABNORMAL LOW (ref 3.87–5.11)
RDW: 15.3 % (ref 11.5–15.5)
WBC: 4.2 10*3/uL (ref 4.0–10.5)
nRBC: 0 % (ref 0.0–0.2)

## 2021-07-09 LAB — GASTROINTESTINAL PANEL BY PCR, STOOL (REPLACES STOOL CULTURE)

## 2021-07-09 LAB — COMPREHENSIVE METABOLIC PANEL
ALT: 11 U/L (ref 0–44)
AST: 15 U/L (ref 15–41)
Albumin: 2.7 g/dL — ABNORMAL LOW (ref 3.5–5.0)
Alkaline Phosphatase: 31 U/L — ABNORMAL LOW (ref 38–126)
Anion gap: 9 (ref 5–15)
BUN: <5 mg/dL — ABNORMAL LOW (ref 6–20)
CO2: 24 mmol/L (ref 22–32)
Calcium: 8 mg/dL — ABNORMAL LOW (ref 8.9–10.3)
Chloride: 105 mmol/L (ref 98–111)
Creatinine, Ser: 0.69 mg/dL (ref 0.44–1.00)
GFR, Estimated: >60 mL/min (ref >60)
Glucose, Bld: 88 mg/dL (ref 70–99)
Potassium: 3.7 mmol/L (ref 3.5–5.1)
Sodium: 138 mmol/L (ref 135–145)
Total Bilirubin: 0.4 mg/dL (ref 0.3–1.2)
Total Protein: 5.5 g/dL — ABNORMAL LOW (ref 6.5–8.1)

## 2021-07-09 MED ORDER — FLEET ENEMA 7-19 GM/118ML RE ENEM
1.0000 | ENEMA | Freq: Once | RECTAL | Status: AC
  Administered 2021-07-10: 1 via RECTAL
  Filled 2021-07-09: qty 1

## 2021-07-09 NOTE — Plan of Care (Signed)
Full note to follow.  GI path panel negative.  Plan for sigmoidoscopy tomorrow for further evaluation.

## 2021-07-09 NOTE — Progress Notes (Signed)
[facility] Gastroenterology Progress Note  Rita Cole 61 y.o. 28-Dec-1960  CC:  Nausea, vomiting, diarrhea.   Subjective: Patient is continuing to have bloody diarrhea, starting full liquids. Still having abdominal pain when she is hungry. C. Diff is negative. GI pathogen panel is negative.  ROS : Review of Systems  Gastrointestinal:  Positive for abdominal pain, blood in stool, diarrhea and nausea. Negative for constipation, heartburn, melena and vomiting.  Genitourinary:  Negative for dysuria and urgency.     Objective: Vital signs in last 24 hours: Vitals:   07/09/21 0456 07/09/21 1415  BP: (!) 144/94 137/84  Pulse: 64 64  Resp: 14 15  Temp: 98.6 F (37 C) 98.4 F (36.9 C)  SpO2: 98% 100%    Physical Exam:  General:  Alert, cooperative, no distress, appears stated age  Head:  Normocephalic, without obvious abnormality, atraumatic  Eyes:  Anicteric sclera, EOM's intact  Lungs:   Clear to auscultation bilaterally, respirations unlabored  Heart:  Regular rate and rhythm, S1, S2 normal  Abdomen:   Soft, non-tender, bowel sounds active all four quadrants,  no masses,     Lab Results: Recent Labs    07/07/21 0035 07/08/21 0500 07/09/21 0502  NA 135 136 138  K 3.1* 3.4* 3.7  CL 102 102 105  CO2 26 26 24   GLUCOSE 127* 95 88  BUN 5* <5* <5*  CREATININE 0.95 0.63 0.69  CALCIUM 8.0* 7.8* 8.0*  MG 2.0 1.6*  --   PHOS  --  2.3*  --    Recent Labs    07/06/21 1657 07/08/21 0500 07/09/21 0502  AST 18  --  15  ALT 13  --  11  ALKPHOS 52  --  31*  BILITOT 0.5  --  0.4  PROT 7.6  --  5.5*  ALBUMIN 3.9 2.7* 2.7*   Recent Labs    07/06/21 1657 07/07/21 0035 07/08/21 0500 07/09/21 0502  WBC 11.4*   < > 5.9 4.2  NEUTROABS 9.8*  --  3.6  --   HGB 12.4   < > 9.3* 9.6*  HCT 38.2   < > 29.3* 30.7*  MCV 91.8   < > 93.0 93.3  PLT 217   < > 164 175   < > = values in this interval not displayed.   No results for input(s): LABPROT, INR in the last 72  hours.    Assessment Colitis; Crohn's flare up vs radiation induced. - hgb 9.6 - No leukocytosis - K 3.4 - CRP 11.6 - GI pathogen panel negative, negative C. Diff - CT abdomen pelvis with contrast 1/9: mild colonic wall thickening throughout, likely infectious vs inflammatory colitis.   Epigastric pain - Lipase normal   Plan: Plan for Flex sig tomorrow. I thoroughly discussed the procedures to include nature, alternatives, benefits, and risks including but not limited to bleeding, perforation, infection, anesthesia/cardiac and pulmonary complications. Patient provides understanding and gave verbal consent to proceed Continue daily CBC with transfusion as needed to maintain Hgb >7.  Enema prep tomorrow morning Continue conservative management [facility] will follow.     Garnette Scheuermann PA-C 07/09/2021, 3:54 PM  Contact #  [phone]

## 2021-07-09 NOTE — Progress Notes (Signed)
PROGRESS NOTE    Rita Cole  DAH:[id number] DOB: 1961/03/23 DOA: 07/06/2021 PCP: Shanna Cisco, NP    Brief Narrative:  61 year old female history of malignant left-sided breast cancer status post neoadjuvant chemotherapy, modified radical mastectomy, on active radiation therapy, pembrolizumab treatment, history of hypertension, recent diagnosis of Crohn's disease presenting to the ED with sepsis felt secondary to colitis.  Stools studies pending.  Patient placed empirically on IV antibiotics.  GI consulted.  Assessment & Plan:   Principal Problem:   Sepsis (HCC) Active Problems:   Malignant neoplasm of upper-outer quadrant of left breast in female, estrogen receptor negative (HCC)   Port-A-Cath in place   S/P mastectomy, left   Colitis   Hypokalemia   Hypomagnesemia   Hypertension   #1 sepsis secondary to colitis, POA -Patient presented with sepsis felt secondary to colitis and met criteria for sepsis with fever, tachycardia, tachypnea, CT findings consistent with a colitis. -Concern for infectious versus inflammatory colitis. -C. difficile PCR negative. --Patient still with ongoing diarrhea, mucus stools -Patient noted to have a fever 101.3 on presentation which has since improved. -Continued on empiric IV Rocephin, IV Flagyl. -GI panel is neg -GI following, recommendation for flex sig tomorrow  2.  Hypokalemia/hypomagnesemia -Likely secondary to GI losses. -corrected -Cont to follow lytes  3.  History of recently diagnosed Crohn's disease -Patient states diagnosed by GI specialist at [facility], Dr. Carney Harder. -Was on mesalamine prior to admission currently on hold. -GI cont to follow  4.  Hypertension -BP noted to be on the softer side secondary to hypovolemia. -Continue to hold antihypertensive medications. -Continued on home regimen Toprol-XL.  5.  Malignant left-sided breast cancer -Being followed by oncology, Dr.Gudena, and  radiation oncology Dr. Mitzi Hansen. -Status post neoadjuvant chemotherapy followed by mastectomy currently receiving radiation and immunotherapy with pembrolizumab. -Oncology was made aware of pt's admit    DVT prophylaxis: SCD's Code Status: Full Family Communication: Pt in room, family at bedside  Status is: Inpatient  Remains inpatient appropriate because: Severity of illness    Consultants:  GI  Procedures:    Antimicrobials: Anti-infectives (From admission, onward)    Start     Dose/Rate Route Frequency Ordered Stop   07/07/21 2000  cefTRIAXone (ROCEPHIN) 2 g in sodium chloride 0.9 % 100 mL IVPB        2 g 200 mL/hr over 30 Minutes Intravenous Every 24 hours 07/06/21 2114     07/06/21 1900  cefTRIAXone (ROCEPHIN) 1 g in sodium chloride 0.9 % 100 mL IVPB        1 g 200 mL/hr over 30 Minutes Intravenous  Once 07/06/21 1851 07/06/21 2147   07/06/21 1900  metroNIDAZOLE (FLAGYL) IVPB 500 mg        500 mg 100 mL/hr over 60 Minutes Intravenous Every 12 hours 07/06/21 1851         Subjective: Tolerating full liquid diet. Eager to advance diet  Objective: Vitals:   07/08/21 1333 07/08/21 2141 07/09/21 0456 07/09/21 1415  BP: (!) 135/97 130/76 (!) 144/94 137/84  Pulse: 61 65 64 64  Resp: 16 14 14 15   Temp: 97.7 F (36.5 C) 98.1 F (36.7 C) 98.6 F (37 C) 98.4 F (36.9 C)  TempSrc: Oral Oral Oral Oral  SpO2: 100% 98% 98% 100%  Weight:   84.5 kg   Height:        Intake/Output Summary (Last 24 hours) at 07/09/2021 1829 Last data filed at 07/09/2021 1100 Gross per 24  hour  Intake 898 ml  Output --  Net 898 ml    Filed Weights   07/06/21 1524 07/06/21 2257 07/09/21 0456  Weight: 84.6 kg 84.4 kg 84.5 kg    Examination: General exam: Conversant, in no acute distress Respiratory system: normal chest rise, clear, no audible wheezing Cardiovascular system: regular rhythm, s1-s2 Gastrointestinal system: Nondistended, nontender, pos BS Central nervous system: No  seizures, no tremors Extremities: No cyanosis, no joint deformities Skin: No rashes, no pallor Psychiatry: Affect normal // no auditory hallucinations   Data Reviewed: I have personally reviewed following labs and imaging studies  CBC: Recent Labs  Lab 07/06/21 1657 07/07/21 0035 07/08/21 0500 07/09/21 0502  WBC 11.4* 6.3 5.9 4.2  NEUTROABS 9.8*  --  3.6  --   HGB 12.4 10.9* 9.3* 9.6*  HCT 38.2 33.7* 29.3* 30.7*  MCV 91.8 91.6 93.0 93.3  PLT 217 181 164 620    Basic Metabolic Panel: Recent Labs  Lab 07/06/21 1657 07/07/21 0035 07/08/21 0500 07/09/21 0502  NA 139 135 136 138  K 2.9* 3.1* 3.4* 3.7  CL 99 102 102 105  CO2 $Re'30 26 26 24  'VmZ$ GLUCOSE 114* 127* 95 88  BUN 6 5* <5* <5*  CREATININE 0.96 0.95 0.63 0.69  CALCIUM 9.1 8.0* 7.8* 8.0*  MG 1.6* 2.0 1.6*  --   PHOS  --   --  2.3*  --     GFR: Estimated Creatinine Clearance: 81.3 mL/min (by C-G formula based on SCr of 0.69 mg/dL). Liver Function Tests: Recent Labs  Lab 07/06/21 1657 07/08/21 0500 07/09/21 0502  AST 18  --  15  ALT 13  --  11  ALKPHOS 52  --  31*  BILITOT 0.5  --  0.4  PROT 7.6  --  5.5*  ALBUMIN 3.9 2.7* 2.7*    Recent Labs  Lab 07/06/21 1657  LIPASE 25    No results for input(s): AMMONIA in the last 168 hours. Coagulation Profile: No results for input(s): INR, PROTIME in the last 168 hours. Cardiac Enzymes: No results for input(s): CKTOTAL, CKMB, CKMBINDEX, TROPONINI in the last 168 hours. BNP (last 3 results) No results for input(s): PROBNP in the last 8760 hours. HbA1C: No results for input(s): HGBA1C in the last 72 hours. CBG: No results for input(s): GLUCAP in the last 168 hours. Lipid Profile: No results for input(s): CHOL, HDL, LDLCALC, TRIG, CHOLHDL, LDLDIRECT in the last 72 hours. Thyroid Function Tests: No results for input(s): TSH, T4TOTAL, FREET4, T3FREE, THYROIDAB in the last 72 hours. Anemia Panel: No results for input(s): VITAMINB12, FOLATE, FERRITIN, TIBC, IRON,  RETICCTPCT in the last 72 hours. Sepsis Labs: Recent Labs  Lab 07/07/21 0035  PROCALCITON <0.10  LATICACIDVEN 1.7     Recent Results (from the past 240 hour(s))  C Difficile Quick Screen w PCR reflex     Status: None   Collection Time: 07/06/21  4:57 PM   Specimen: Stool  Result Value Ref Range Status   C Diff antigen NEGATIVE NEGATIVE Final   C Diff toxin NEGATIVE NEGATIVE Final   C Diff interpretation No C. difficile detected.  Final    Comment: Performed at [facility], [place] [address] [place], [place] [address]  Resp Panel by RT-PCR (Flu A&B, Covid) Nasopharyngeal Swab     Status: None   Collection Time: 07/06/21  6:52 PM   Specimen: Nasopharyngeal Swab; Nasopharyngeal(NP) swabs in vial transport medium  Result Value Ref Range Status   SARS  Coronavirus 2 by RT PCR NEGATIVE NEGATIVE Final    Comment: (NOTE) SARS-CoV-2 target nucleic acids are NOT DETECTED.  The SARS-CoV-2 RNA is generally detectable in upper respiratory specimens during the acute phase of infection. The lowest concentration of SARS-CoV-2 viral copies this assay can detect is 138 copies/mL. A negative result does not preclude SARS-Cov-2 infection and should not be used as the sole basis for treatment or other patient management decisions. A negative result may occur with  improper specimen collection/handling, submission of specimen other than nasopharyngeal swab, presence of viral mutation(s) within the areas targeted by this assay, and inadequate number of viral copies(<138 copies/mL). A negative result must be combined with clinical observations, patient history, and epidemiological information. The expected result is Negative.  Fact Sheet for Patients:  EntrepreneurPulse.com.au  Fact Sheet for Healthcare Providers:  IncredibleEmployment.be  This test is no t yet approved or cleared by the [place] FDA and  has been authorized for  detection and/or diagnosis of SARS-CoV-2 by FDA under an Emergency Use Authorization (EUA). This EUA will remain  in effect (meaning this test can be used) for the duration of the COVID-19 declaration under Section 564(b)(1) of the Act, 21 U.S.C.section 360bbb-3(b)(1), unless the authorization is terminated  or revoked sooner.       Influenza A by PCR NEGATIVE NEGATIVE Final   Influenza B by PCR NEGATIVE NEGATIVE Final    Comment: (NOTE) The Xpert Xpress SARS-CoV-2/FLU/RSV plus assay is intended as an aid in the diagnosis of influenza from Nasopharyngeal swab specimens and should not be used as a sole basis for treatment. Nasal washings and aspirates are unacceptable for Xpert Xpress SARS-CoV-2/FLU/RSV testing.  Fact Sheet for Patients: EntrepreneurPulse.com.au  Fact Sheet for Healthcare Providers: IncredibleEmployment.be  This test is not yet approved or cleared by the [place] FDA and has been authorized for detection and/or diagnosis of SARS-CoV-2 by FDA under an Emergency Use Authorization (EUA). This EUA will remain in effect (meaning this test can be used) for the duration of the COVID-19 declaration under Section 564(b)(1) of the Act, 21 U.S.C. section 360bbb-3(b)(1), unless the authorization is terminated or revoked.  Performed at [facility], [place] [address], [place], [place] [address]   Culture, blood (routine x 2)     Status: None (Preliminary result)   Collection Time: 07/07/21 12:35 AM   Specimen: BLOOD RIGHT WRIST  Result Value Ref Range Status   Specimen Description   Final    BLOOD RIGHT WRIST Performed at [place] [address] [place], [place] [address]    Special Requests   Final    BOTTLES DRAWN AEROBIC ONLY Blood Culture adequate volume Performed at [place] [address], [place], [place] [address]    Culture   Final    NO GROWTH 2 DAYS Performed at  [place] [address] [place], [place] [address]    Report Status PENDING  Incomplete  Culture, blood (routine x 2)     Status: None (Preliminary result)   Collection Time: 07/07/21 12:35 AM   Specimen: BLOOD RIGHT HAND  Result Value Ref Range Status   Specimen Description   Final    BLOOD RIGHT HAND Performed at [facility], [place] [address], [place], [place] [address]    Special Requests   Final    BOTTLES DRAWN AEROBIC ONLY Blood Culture adequate volume Performed at [place] [address], [place], [place] [address]  Culture   Final    NO GROWTH 2 DAYS Performed at [facility], [place] [address], [place], [place] [address]    Report Status PENDING  Incomplete  Gastrointestinal Panel by PCR , Stool     Status: None   Collection Time: 07/08/21  6:56 PM   Specimen: Stool  Result Value Ref Range Status   Campylobacter species NOT DETECTED NOT DETECTED Final   Plesimonas shigelloides NOT DETECTED NOT DETECTED Final   Salmonella species NOT DETECTED NOT DETECTED Final   Yersinia enterocolitica NOT DETECTED NOT DETECTED Final   Vibrio species NOT DETECTED NOT DETECTED Final   Vibrio cholerae NOT DETECTED NOT DETECTED Final   Enteroaggregative E coli (EAEC) NOT DETECTED NOT DETECTED Final   Enteropathogenic E coli (EPEC) NOT DETECTED NOT DETECTED Final   Enterotoxigenic E coli (ETEC) NOT DETECTED NOT DETECTED Final   Shiga like toxin producing E coli (STEC) NOT DETECTED NOT DETECTED Final   Shigella/Enteroinvasive E coli (EIEC) NOT DETECTED NOT DETECTED Final   Cryptosporidium NOT DETECTED NOT DETECTED Final   Cyclospora cayetanensis NOT DETECTED NOT DETECTED Final   Entamoeba histolytica NOT DETECTED NOT DETECTED Final   Giardia lamblia NOT DETECTED NOT DETECTED Final   Adenovirus F40/41 NOT DETECTED NOT DETECTED Final   Astrovirus NOT DETECTED NOT DETECTED Final   Norovirus GI/GII NOT DETECTED NOT DETECTED Final   Rotavirus A  NOT DETECTED NOT DETECTED Final   Sapovirus (I, II, IV, and V) NOT DETECTED NOT DETECTED Final    Comment: Performed at [facility], [address] [place] [address] [place], [place] [address]      Radiology Studies: No results found.  Scheduled Meds:  metoprolol succinate  50 mg Oral Daily   [START ON 07/10/2021] sodium phosphate  1 enema Rectal Once   Continuous Infusions:  sodium chloride 100 mL/hr at 07/08/21 1832   cefTRIAXone (ROCEPHIN)  IV 2 g (07/08/21 2207)   metronidazole 500 mg (07/09/21 0903)     LOS: 2 days   Marylu Lund, MD [facility] Pager On Amion  If 7PM-7AM, please contact night-coverage 07/09/2021, 6:29 PM

## 2021-07-10 ENCOUNTER — Inpatient Hospital Stay (HOSPITAL_COMMUNITY): Payer: Federal, State, Local not specified - PPO | Admitting: Anesthesiology

## 2021-07-10 ENCOUNTER — Encounter (HOSPITAL_COMMUNITY)
Admission: EM | Disposition: A | Discharge status: Home / Self Care | Payer: Self-pay | Source: Home / Self Care | Attending: Internal Medicine

## 2021-07-10 ENCOUNTER — Ambulatory Visit
Admission: RE | Admit: 2021-07-10 | Discharge: 2021-07-10 | Disposition: A | Discharge status: Home / Self Care | Payer: Federal, State, Local not specified - PPO | Source: Ambulatory Visit | Attending: Radiation Oncology | Admitting: Radiation Oncology

## 2021-07-10 DIAGNOSIS — K529 Noninfective gastroenteritis and colitis, unspecified: Secondary | ICD-10-CM | POA: Diagnosis not present

## 2021-07-10 HISTORY — PX: FLEXIBLE SIGMOIDOSCOPY: SHX5431

## 2021-07-10 SURGERY — SIGMOIDOSCOPY, FLEXIBLE
Anesthesia: Monitor Anesthesia Care

## 2021-07-10 MED ORDER — LACTATED RINGERS IV SOLN: INTRAVENOUS | Status: DC

## 2021-07-10 MED ORDER — METHYLPREDNISOLONE SODIUM SUCC 40 MG IJ SOLR
40.0000 mg | Freq: Three times a day (TID) | INTRAMUSCULAR | Status: AC
  Administered 2021-07-10 – 2021-07-12 (×8): 40 mg via INTRAVENOUS
  Filled 2021-07-10 (×8): qty 1

## 2021-07-10 MED ORDER — PROPOFOL 10 MG/ML IV BOLUS
INTRAVENOUS | Status: DC | PRN
Start: 2021-07-10 — End: 2021-07-10
  Administered 2021-07-10 (×3): 20 mg via INTRAVENOUS

## 2021-07-10 MED ORDER — PROPOFOL 500 MG/50ML IV EMUL
INTRAVENOUS | Status: DC | PRN
  Administered 2021-07-10: 140 ug/kg/min via INTRAVENOUS

## 2021-07-10 MED ORDER — ONDANSETRON HCL 4 MG/2ML IJ SOLN
INTRAMUSCULAR | Status: DC | PRN
  Administered 2021-07-10: 4 mg via INTRAVENOUS

## 2021-07-10 NOTE — Progress Notes (Signed)
[facility] [place] [facility] Progress Note  Rita Cole 61 y.o. Nov 01, 1960  CC:  Nausea, vomiting, diarrhea.   Subjective: Patient states she is still having frequent bloody bowel movements. Tolerating diet well.  ROS : Review of Systems  Gastrointestinal:  Positive for abdominal pain, blood in stool, nausea and vomiting. Negative for constipation, diarrhea, heartburn and melena.  Genitourinary:  Negative for dysuria and urgency.     Objective: Vital signs in last 24 hours: Vitals:   07/10/21 1305 07/10/21 1310  BP:    Pulse: (!) 57 70  Resp: (!) 27 18  Temp:    SpO2: 100% 100%    Physical Exam:  General:  Alert, cooperative, no distress, appears stated age  Head:  Normocephalic, without obvious abnormality, atraumatic  Eyes:  Anicteric sclera, EOM's intact  Lungs:   Clear to auscultation bilaterally, respirations unlabored  Heart:  Regular rate and rhythm, S1, S2 normal  Abdomen:   Soft, non-tender, bowel sounds active all four quadrants,  no masses,     Lab Results: Recent Labs    07/08/21 0500 07/09/21 0502  NA 136 138  K 3.4* 3.7  CL 102 105  CO2 26 24  GLUCOSE 95 88  BUN <5* <5*  CREATININE 0.63 0.69  CALCIUM 7.8* 8.0*  MG 1.6*  --   PHOS 2.3*  --    Recent Labs    07/08/21 0500 07/09/21 0502  AST  --  15  ALT  --  11  ALKPHOS  --  31*  BILITOT  --  0.4  PROT  --  5.5*  ALBUMIN 2.7* 2.7*   Recent Labs    07/08/21 0500 07/09/21 0502  WBC 5.9 4.2  NEUTROABS 3.6  --   HGB 9.3* 9.6*  HCT 29.3* 30.7*  MCV 93.0 93.3  PLT 164 175   No results for input(s): LABPROT, INR in the last 72 hours.    Assessment Colitis; Crohn's flare up vs radiation induced. - hgb 9.6 - No leukocytosis - K 3.7 - CRP 11.6 - GI pathogen panel negative, negative C. Diff - CT abdomen pelvis with contrast 1/9: mild colonic wall thickening throughout, likely infectious vs inflammatory colitis.   Epigastric pain - Lipase normal   Plan: Flexible  sigmoidoscopy today: showed congested, erythematous mucosa in the rectum and in rectum and recto-sigmoid colon and sigmoid. Biopsies taken. Will follow up on pathology Start steroids Continue supportive care [facility] follow.  Kardell Virgil Radford Pax PA-C 07/10/2021, 1:33 PM  Contact #  [phone]

## 2021-07-10 NOTE — Transfer of Care (Signed)
Immediate Anesthesia Transfer of Care Note  Patient: Rita Cole  Procedure(s) Performed: FLEXIBLE SIGMOIDOSCOPY  Patient Location: PACU  Anesthesia Type:MAC  Level of Consciousness: sedated  Airway & Oxygen Therapy: Patient Spontanous Breathing and Patient connected to face mask oxygen  Post-op Assessment: Report given to RN and Post -op Vital signs reviewed and stable  Post vital signs: Reviewed and stable  Last Vitals:  Vitals Value Taken Time  BP    Temp    Pulse    Resp    SpO2      Last Pain:  Vitals:   07/10/21 1148  TempSrc: Temporal  PainSc: 0-No pain         Complications: No notable events documented.

## 2021-07-10 NOTE — Interval H&P Note (Signed)
History and Physical Interval Note:  07/10/2021 12:31 PM  Rita Cole  has presented today for surgery, with the diagnosis of hematochezia, colitis.  The various methods of treatment have been discussed with the patient and family. After consideration of risks, benefits and other options for treatment, the patient has consented to  Procedure(s): FLEXIBLE SIGMOIDOSCOPY (N/A) as a surgical intervention.  The patient's history has been reviewed, patient examined, no change in status, stable for surgery.  I have reviewed the patient's chart and labs.  Questions were answered to the patient's satisfaction.     Landry Dyke

## 2021-07-10 NOTE — Anesthesia Preprocedure Evaluation (Addendum)
Anesthesia Evaluation  Patient identified by MRN, date of birth, ID band Patient awake    Reviewed: Allergy & Precautions, NPO status , Patient's Chart, lab work & pertinent test results  History of Anesthesia Complications Negative for: history of anesthetic complications  Airway Mallampati: II  TM Distance: >3 FB     Dental no notable dental hx. (+) Dental Advisory Given   Pulmonary former smoker,    Pulmonary exam normal        Cardiovascular hypertension, Pt. on home beta blockers and Pt. on medications Normal cardiovascular exam     Neuro/Psych negative neurological ROS     GI/Hepatic Neg liver ROS,   Endo/Other  negative endocrine ROS  Renal/GU negative Renal ROS     Musculoskeletal negative musculoskeletal ROS (+)   Abdominal   Peds  Hematology negative hematology ROS (+)   Anesthesia Other Findings   Reproductive/Obstetrics                            Anesthesia Physical Anesthesia Plan  ASA: 2  Anesthesia Plan: MAC   Post-op Pain Management: Minimal or no pain anticipated   Induction:   PONV Risk Score and Plan: 2 and Ondansetron and Propofol infusion  Airway Management Planned: Simple Face Mask  Additional Equipment:   Intra-op Plan:   Post-operative Plan:   Informed Consent: I have reviewed the patients History and Physical, chart, labs and discussed the procedure including the risks, benefits and alternatives for the proposed anesthesia with the patient or authorized representative who has indicated his/her understanding and acceptance.     Dental advisory given  Plan Discussed with: Anesthesiologist and CRNA  Anesthesia Plan Comments:        Anesthesia Quick Evaluation

## 2021-07-10 NOTE — Anesthesia Postprocedure Evaluation (Signed)
Anesthesia Post Note  Patient: Rita Cole  Procedure(s) Performed: [place]     Patient location during evaluation: Endoscopy Anesthesia Type: MAC Level of consciousness: awake and alert Pain management: pain level controlled Vital Signs Assessment: post-procedure vital signs reviewed and stable Respiratory status: spontaneous breathing and respiratory function stable Cardiovascular status: stable Postop Assessment: no apparent nausea or vomiting Anesthetic complications: no   No notable events documented.  Last Vitals:  Vitals:   07/10/21 1305 07/10/21 1310  BP:    Pulse: (!) 57 70  Resp: (!) 27 18  Temp:    SpO2: 100% 100%    Last Pain:  Vitals:   07/10/21 1253  TempSrc: Oral  PainSc: 0-No pain                 [name]

## 2021-07-10 NOTE — Progress Notes (Signed)
PROGRESS NOTE    Rita Cole  YBB:[id number] DOB: May 13, 1961 DOA: 07/06/2021 PCP: Shanna Cisco, NP    Brief Narrative:  61 year old female history of malignant left-sided breast cancer status post neoadjuvant chemotherapy, modified radical mastectomy, on active radiation therapy, pembrolizumab treatment, history of hypertension, recent diagnosis of Crohn's disease presenting to the ED with sepsis felt secondary to colitis.  Stools studies pending.  Patient placed empirically on IV antibiotics.  GI consulted.  Assessment & Plan:   Principal Problem:   Sepsis (HCC) Active Problems:   Malignant neoplasm of upper-outer quadrant of left breast in female, estrogen receptor negative (HCC)   Port-A-Cath in place   S/P mastectomy, left   Colitis   Hypokalemia   Hypomagnesemia   Hypertension   #1 sepsis secondary to colitis, POA -Patient presented with sepsis felt secondary to colitis and met criteria for sepsis with fever, tachycardia, tachypnea, CT findings consistent with a colitis. -Concern for infectious versus inflammatory colitis. -C. difficile PCR negative. --Patient still with ongoing diarrhea, mucus stools -Patient noted to have a fever 101.3 on presentation which has since resolved -Pt had been continued on empiric IV Rocephin, IV Flagyl. -GI panel is neg -GI following, now s/p flex sig, reviewed. Findings suggestive of inflammatory colitis with steroids started  2.  Hypokalemia/hypomagnesemia -Likely secondary to GI losses. -corrected -Continue to follow lytes  3.  History of recently diagnosed Crohn's disease -Patient states diagnosed by GI specialist at [facility], Dr. Carney Harder. -Was on mesalamine prior to admission currently on hold. -GI following. Now on steroids per above  4.  Hypertension -Continued on home regimen Toprol-XL. -BP stable at this time  5.  Malignant left-sided breast cancer -Being followed by oncology, Dr.Gudena,  and radiation oncology Dr. Mitzi Hansen. -Status post neoadjuvant chemotherapy followed by mastectomy currently receiving radiation and immunotherapy with pembrolizumab. -Oncology was made aware of pt's admit    DVT prophylaxis: SCD's Code Status: Full Family Communication: Pt in room, family at bedside  Status is: Inpatient  Remains inpatient appropriate because: Severity of illness    Consultants:  GI Oncology  Procedures:  Flex sig 07/10/21  Antimicrobials: Anti-infectives (From admission, onward)    Start     Dose/Rate Route Frequency Ordered Stop   07/07/21 2000  cefTRIAXone (ROCEPHIN) 2 g in sodium chloride 0.9 % 100 mL IVPB        2 g 200 mL/hr over 30 Minutes Intravenous Every 24 hours 07/06/21 2114     07/06/21 1900  cefTRIAXone (ROCEPHIN) 1 g in sodium chloride 0.9 % 100 mL IVPB        1 g 200 mL/hr over 30 Minutes Intravenous  Once 07/06/21 1851 07/06/21 2147   07/06/21 1900  metroNIDAZOLE (FLAGYL) IVPB 500 mg        500 mg 100 mL/hr over 60 Minutes Intravenous Every 12 hours 07/06/21 1851         Subjective: Seen prior to flex sig. Eager to have procedure done to establish diagnosis and also eager to start diet  Objective: Vitals:   07/10/21 1257 07/10/21 1305 07/10/21 1310 07/10/21 1428  BP: (!) 116/44   138/90  Pulse:  (!) 57 70 60  Resp:  (!) 27 18 17   Temp:    98.3 F (36.8 C)  TempSrc:    Oral  SpO2:  100% 100% 100%  Weight:      Height:        Intake/Output Summary (Last 24 hours) at 07/10/2021 1609 Last  data filed at 07/10/2021 1248 Gross per 24 hour  Intake 4268.4 ml  Output --  Net 4268.4 ml    Filed Weights   07/06/21 2257 07/09/21 0456 07/10/21 0442  Weight: 84.4 kg 84.5 kg 84.7 kg    Examination: General exam: Awake, laying in bed, in nad Respiratory system: Normal respiratory effort, no wheezing Cardiovascular system: regular rate, s1, s2 Gastrointestinal system: Soft, nondistended, positive BS Central nervous system: CN2-12  grossly intact, strength intact Extremities: Perfused, no clubbing Skin: Normal skin turgor, no notable skin lesions seen Psychiatry: Mood normal // no visual hallucinations   Data Reviewed: I have personally reviewed following labs and imaging studies  CBC: Recent Labs  Lab 07/06/21 1657 07/07/21 0035 07/08/21 0500 07/09/21 0502  WBC 11.4* 6.3 5.9 4.2  NEUTROABS 9.8*  --  3.6  --   HGB 12.4 10.9* 9.3* 9.6*  HCT 38.2 33.7* 29.3* 30.7*  MCV 91.8 91.6 93.0 93.3  PLT 217 181 164 324    Basic Metabolic Panel: Recent Labs  Lab 07/06/21 1657 07/07/21 0035 07/08/21 0500 07/09/21 0502  NA 139 135 136 138  K 2.9* 3.1* 3.4* 3.7  CL 99 102 102 105  CO2 _0 GLUCOSE 114* 127* 95 88  BUN 6 5* <5* <5*  CREATININE 0.96 0.95 0.63 0.69  CALCIUM 9.1 8.0* 7.8* 8.0*  MG 1.6* 2.0 1.6*  --   PHOS  --   --  2.3*  --     GFR: Estimated Creatinine Clearance: 81.4 mL/min (by C-G formula based on SCr of 0.69 mg/dL). Liver Function Tests: Recent Labs  Lab 07/06/21 1657 07/08/21 0500 07/09/21 0502  AST 18  --  15  ALT 13  --  11  ALKPHOS 52  --  31*  BILITOT 0.5  --  0.4  PROT 7.6  --  5.5*  ALBUMIN 3.9 2.7* 2.7*    Recent Labs  Lab 07/06/21 1657  LIPASE 25    No results for input(s): AMMONIA in the last 168 hours. Coagulation Profile: No results for input(s): INR, PROTIME in the last 168 hours. Cardiac Enzymes: No results for input(s): CKTOTAL, CKMB, CKMBINDEX, TROPONINI in the last 168 hours. BNP (last 3 results) No results for input(s): PROBNP in the last 8760 hours. HbA1C: No results for input(s): HGBA1C in the last 72 hours. CBG: No results for input(s): GLUCAP in the last 168 hours. Lipid Profile: No results for input(s): CHOL, HDL, LDLCALC, TRIG, CHOLHDL, LDLDIRECT in the last 72 hours. Thyroid Function Tests: No results for input(s): TSH, T4TOTAL, FREET4, T3FREE, THYROIDAB in the last 72 hours. Anemia Panel: No results for input(s): VITAMINB12,  FOLATE, FERRITIN, TIBC, IRON, RETICCTPCT in the last 72 hours. Sepsis Labs: Recent Labs  Lab 07/07/21 0035  PROCALCITON <0.10  LATICACIDVEN 1.7     Recent Results (from the past 240 hour(s))  C Difficile Quick Screen w PCR reflex     Status: None   Collection Time: 07/06/21  4:57 PM   Specimen: Stool  Result Value Ref Range Status   C Diff antigen NEGATIVE NEGATIVE Final   C Diff toxin NEGATIVE NEGATIVE Final   C Diff interpretation No C. difficile detected.  Final    Comment: Performed at [facility], [place] [address] [place], [place] [address]  Resp Panel by RT-PCR (Flu A&B, Covid) Nasopharyngeal Swab     Status: None   Collection Time: 07/06/21  6:52 PM   Specimen: Nasopharyngeal Swab; Nasopharyngeal(NP) swabs in vial  transport medium  Result Value Ref Range Status   SARS Coronavirus 2 by RT PCR NEGATIVE NEGATIVE Final    Comment: (NOTE) SARS-CoV-2 target nucleic acids are NOT DETECTED.  The SARS-CoV-2 RNA is generally detectable in upper respiratory specimens during the acute phase of infection. The lowest concentration of SARS-CoV-2 viral copies this assay can detect is 138 copies/mL. A negative result does not preclude SARS-Cov-2 infection and should not be used as the sole basis for treatment or other patient management decisions. A negative result may occur with  improper specimen collection/handling, submission of specimen other than nasopharyngeal swab, presence of viral mutation(s) within the areas targeted by this assay, and inadequate number of viral copies(<138 copies/mL). A negative result must be combined with clinical observations, patient history, and epidemiological information. The expected result is Negative.  Fact Sheet for Patients:  EntrepreneurPulse.com.au  Fact Sheet for Healthcare Providers:  IncredibleEmployment.be  This test is no t yet approved or cleared by the [place] FDA and   has been authorized for detection and/or diagnosis of SARS-CoV-2 by FDA under an Emergency Use Authorization (EUA). This EUA will remain  in effect (meaning this test can be used) for the duration of the COVID-19 declaration under Section 564(b)(1) of the Act, 21 U.S.C.section 360bbb-3(b)(1), unless the authorization is terminated  or revoked sooner.       Influenza A by PCR NEGATIVE NEGATIVE Final   Influenza B by PCR NEGATIVE NEGATIVE Final    Comment: (NOTE) The Xpert Xpress SARS-CoV-2/FLU/RSV plus assay is intended as an aid in the diagnosis of influenza from Nasopharyngeal swab specimens and should not be used as a sole basis for treatment. Nasal washings and aspirates are unacceptable for Xpert Xpress SARS-CoV-2/FLU/RSV testing.  Fact Sheet for Patients: EntrepreneurPulse.com.au  Fact Sheet for Healthcare Providers: IncredibleEmployment.be  This test is not yet approved or cleared by the [place] FDA and has been authorized for detection and/or diagnosis of SARS-CoV-2 by FDA under an Emergency Use Authorization (EUA). This EUA will remain in effect (meaning this test can be used) for the duration of the COVID-19 declaration under Section 564(b)(1) of the Act, 21 U.S.C. section 360bbb-3(b)(1), unless the authorization is terminated or revoked.  Performed at [facility], [place] [address] [place], [place] [address]   Culture, blood (routine x 2)     Status: None (Preliminary result)   Collection Time: 07/07/21 12:35 AM   Specimen: BLOOD RIGHT WRIST  Result Value Ref Range Status   Specimen Description   Final    BLOOD RIGHT WRIST Performed at [place] [address] [place], [place] [address]    Special Requests   Final    BOTTLES DRAWN AEROBIC ONLY Blood Culture adequate volume Performed at [place] [address] [place], [place] [address]    Culture   Final    NO  GROWTH 3 DAYS Performed at [facility], [place] [address] [place], [place] [address]    Report Status PENDING  Incomplete  Culture, blood (routine x 2)     Status: None (Preliminary result)   Collection Time: 07/07/21 12:35 AM   Specimen: BLOOD RIGHT HAND  Result Value Ref Range Status   Specimen Description   Final    BLOOD RIGHT HAND Performed at [facility], [place] [address] [place],  [address]    Special Requests   Final    BOTTLES DRAWN AEROBIC ONLY Blood Culture adequate volume Performed at  [facility], [place] [address], [place], [place] [address]    Culture   Final    NO GROWTH 3 DAYS Performed at [facility], [place] [address], [place], [place] [address]    Report Status PENDING  Incomplete  Gastrointestinal Panel by PCR , Stool     Status: None   Collection Time: 07/08/21  6:56 PM   Specimen: Stool  Result Value Ref Range Status   Campylobacter species NOT DETECTED NOT DETECTED Final   Plesimonas shigelloides NOT DETECTED NOT DETECTED Final   Salmonella species NOT DETECTED NOT DETECTED Final   Yersinia enterocolitica NOT DETECTED NOT DETECTED Final   Vibrio species NOT DETECTED NOT DETECTED Final   Vibrio cholerae NOT DETECTED NOT DETECTED Final   Enteroaggregative E coli (EAEC) NOT DETECTED NOT DETECTED Final   Enteropathogenic E coli (EPEC) NOT DETECTED NOT DETECTED Final   Enterotoxigenic E coli (ETEC) NOT DETECTED NOT DETECTED Final   Shiga like toxin producing E coli (STEC) NOT DETECTED NOT DETECTED Final   Shigella/Enteroinvasive E coli (EIEC) NOT DETECTED NOT DETECTED Final   Cryptosporidium NOT DETECTED NOT DETECTED Final   Cyclospora cayetanensis NOT DETECTED NOT DETECTED Final   Entamoeba histolytica NOT DETECTED NOT DETECTED Final   Giardia lamblia NOT DETECTED NOT DETECTED Final   Adenovirus F40/41 NOT DETECTED NOT DETECTED Final   Astrovirus NOT DETECTED NOT DETECTED Final   Norovirus GI/GII NOT DETECTED NOT  DETECTED Final   Rotavirus A NOT DETECTED NOT DETECTED Final   Sapovirus (I, II, IV, and V) NOT DETECTED NOT DETECTED Final    Comment: Performed at [facility], [address] [place], [place] [address]      Radiology Studies: No results found.  Scheduled Meds:  methylPREDNISolone (SOLU-MEDROL) injection  40 mg Intravenous Q8H   metoprolol succinate  50 mg Oral Daily   Continuous Infusions:  sodium chloride 100 mL/hr at 07/10/21 1152   cefTRIAXone (ROCEPHIN)  IV Stopped (07/09/21 2137)   lactated ringers 10 mL/hr at 07/10/21 1232   metronidazole Stopped (07/10/21 0946)     LOS: 3 days   Marylu Lund, MD [facility] Pager On Amion  If 7PM-7AM, please contact night-coverage 07/10/2021, 4:09 PM

## 2021-07-10 NOTE — Op Note (Signed)
[facility] Patient Name: Rita Cole Procedure Date: 07/10/2021 MRN: [id number] Attending MD: Arta Silence , MD Date of Birth: 04/24/1961 CSN: [id number] Age: 61 Admit Type: Outpatient Procedure:                Flexible Sigmoidoscopy Indications:              Hematochezia, Diarrhea Providers:                Arta Silence, MD, Dulcy Fanny, Frazier Richards,                            Technician Referring MD:             [facility] Medicines:                Propofol per Anesthesia Complications:            No immediate complications. Estimated Blood Loss:     Estimated blood loss: none. Procedure:                Pre-Anesthesia Assessment:                           - Prior to the procedure, a History and Physical                            was performed, and patient medications and                            allergies were reviewed. The patient's tolerance of                            previous anesthesia was also reviewed. The risks                            and benefits of the procedure and the sedation                            options and risks were discussed with the patient.                            All questions were answered, and informed consent                            was obtained. Prior Anticoagulants: The patient has                            taken no previous anticoagulant or antiplatelet                            agents. ASA Grade Assessment: II - A patient with                            mild systemic disease. After reviewing the risks                            and benefits,  the patient was deemed in                            satisfactory condition to undergo the procedure.                           After obtaining informed consent, the scope was                            passed under direct vision. The [id number]-[id number] ([id number])                            Olympus endoscope was introduced through the anus                             and advanced to the the sigmoid colon. The flexible                            sigmoidoscopy was accomplished without difficulty.                            The patient tolerated the procedure well. The                            quality of the bowel preparation was adequate. Scope In: 12:44:48 PM Scope Out: 12:49:26 PM Total Procedure Duration: 0 hours 4 minutes 37 seconds  Findings:      The perianal and digital rectal examinations were normal.      A diffuse area of moderately altered vascular, congested, erythematous,       friable (with contact bleeding), ulcerated and       vascular-pattern-decreased mucosa was found in the rectum, in the       recto-sigmoid colon and in the sigmoid colon. Biopsies were taken with a       cold forceps for histology. Very friable mucosa, bleeding noted with       lavage of water into colon. Given severity of colitis and extensive       friability, did not attempt retroflexion of scope into rectum, and did       not attempt further advancement of scope proximal to distal sigmoid       colon. Impression:               - Altered vascular, congested, erythematous,                            friable (with contact bleeding), ulcerated and                            vascular-pattern-decreased mucosa in the rectum, in                            the recto-sigmoid colon and in the sigmoid colon.                            Biopsied. Differential diagnosis inflammatory  colitis (i.e., UC/Crohn's) versus check point                            inhibitor colitis. Moderate Sedation:      Not Applicable - Patient had care per Anesthesia. Recommendation:           - Return patient to hospital ward for ongoing care.                           - Start steroids, pending biopsy results.                           - Soft diet today.                           - Continue present medications.                           - Await pathology results.                            [facility] will follow. Procedure Code(s):        --- Professional ---                           [phone], Sigmoidoscopy, flexible; with biopsy, single                            or multiple Diagnosis Code(s):        --- Professional ---                           K62.5, Hemorrhage of anus and rectum                           K92.2, Gastrointestinal hemorrhage, unspecified                           K62.6, Ulcer of anus and rectum                           K63.3, Ulcer of intestine                           K62.89, Other specified diseases of anus and rectum                           K63.89, Other specified diseases of intestine                           K92.1, Melena (includes Hematochezia)                           R19.7, Diarrhea, unspecified CPT copyright 2019 American Medical Association. All rights reserved. The codes documented in this report are preliminary and upon coder review may  be revised to meet current compliance requirements. Arta Silence, MD 07/10/2021 12:54:13 PM This report has been signed electronically.  Number of Addenda: 0

## 2021-07-11 DIAGNOSIS — I150 Renovascular hypertension: Secondary | ICD-10-CM | POA: Diagnosis not present

## 2021-07-11 DIAGNOSIS — C50412 Malignant neoplasm of upper-outer quadrant of left female breast: Secondary | ICD-10-CM | POA: Diagnosis not present

## 2021-07-11 DIAGNOSIS — Z171 Estrogen receptor negative status [ER-]: Secondary | ICD-10-CM | POA: Diagnosis not present

## 2021-07-11 NOTE — Progress Notes (Signed)
Patient reports doing much better since starting IV steroids, still has some blood-tinged stool, but much decreased frequency.  Abdominal pain improving as well.  Awaiting sigmoidoscopic biopsies.  Suggest:   Continue IV steroids another day, consider transition to oral steroids tomorrow PM.  Advance diet as tolerated.  Await sigmoidoscopy biopsies.  [facility] will follow.

## 2021-07-11 NOTE — Progress Notes (Signed)
Rita NOTE    DELMA DRONE  PYY:[id number] DOB: 14-Jun-1961 DOA: 07/06/2021 PCP: Riki Sheer, Rita Cole    Brief Narrative:  61 year old female history of malignant left-sided breast cancer status post neoadjuvant chemotherapy, modified radical mastectomy, on active radiation therapy, pembrolizumab treatment, history of hypertension, recent diagnosis of Crohn's disease presenting to the ED with sepsis felt secondary to colitis.  Stools studies pending.  Patient placed empirically on IV antibiotics.  GI consulted.  Assessment & Plan:   Principal Problem:   Sepsis ([place]) Active Problems:   Malignant neoplasm of upper-outer quadrant of left breast in female, estrogen receptor negative ([place])   Port-A-Cath in place   S/P mastectomy, left   Colitis   Hypokalemia   Hypomagnesemia   Hypertension   #1 sepsis secondary to colitis, POA -Patient presented with sepsis felt secondary to colitis and met criteria for sepsis with fever, tachycardia, tachypnea, CT findings consistent with a colitis. -C. difficile PCR negative. -GI panel is neg -GI following, now s/p flex sig, reviewed. Findings suggestive of inflammatory colitis with steroids started. Pt has noted improvement in symptoms -Per GI, recommendation to advance diet as tolerated -Was initially on empiric abx which have since been d/c'd following flex sig results  2.  Hypokalemia/hypomagnesemia -Likely secondary to GI losses. -corrected -Continue to follow lytes  3.  History of recently diagnosed Crohn's disease -Patient states diagnosed by GI specialist at [facility], Dr. Gerri Spore. -Was on mesalamine prior to admission -GI following. Now on steroids per above  4.  Hypertension -Continued on home regimen Toprol-XL. -BP stable at this time  5.  Malignant left-sided breast cancer -Being followed by oncology, Dr.Gudena, and radiation oncology Dr. Lisbeth Renshaw. -Status post neoadjuvant chemotherapy followed by  mastectomy currently receiving radiation and immunotherapy with pembrolizumab. -Oncology was made aware of pt's admit    DVT prophylaxis: SCD's Code Status: Full Family Communication: Pt in room, family at bedside  Status is: Inpatient  Remains inpatient appropriate because: Severity of illness    Consultants:  GI Oncology  Procedures:  Flex sig 07/10/21  Antimicrobials: Anti-infectives (From admission, onward)    Start     Dose/Rate Route Frequency Ordered Stop   07/07/21 2000  cefTRIAXone (ROCEPHIN) 2 g in sodium chloride 0.9 % 100 mL IVPB  Status:  Discontinued        2 g 200 mL/hr over 30 Minutes Intravenous Every 24 hours 07/06/21 2114 07/10/21 1733   07/06/21 1900  cefTRIAXone (ROCEPHIN) 1 g in sodium chloride 0.9 % 100 mL IVPB        1 g 200 mL/hr over 30 Minutes Intravenous  Once 07/06/21 1851 07/06/21 2147   07/06/21 1900  metroNIDAZOLE (FLAGYL) IVPB 500 mg  Status:  Discontinued        500 mg 100 mL/hr over 60 Minutes Intravenous Every 12 hours 07/06/21 1851 07/10/21 1733       Subjective: Tolerating soft diet, still with some blood in stools  Objective: Vitals:   07/10/21 1428 07/10/21 2009 07/11/21 0530 07/11/21 1457  BP: 138/90 (!) 150/92 (!) 151/78 (!) 166/89  Pulse: 60 64 (!) 59 64  Resp: _0 Temp: 98.3 F (36.8 C) 98.4 F (36.9 C) 98.4 F (36.9 C) 98.6 F (37 C)  TempSrc: Oral Oral Oral Oral  SpO2: 100% 100% 100% 100%  Weight:      Height:        Intake/Output Summary (Last 24 hours) at 07/11/2021 1546 Last data filed at  07/11/2021 1459 Gross per 24 hour  Intake 3343 ml  Output 1 ml  Net 3342 ml    Filed Weights   07/06/21 2257 07/09/21 0456 07/10/21 0442  Weight: 84.4 kg 84.5 kg 84.7 kg    Examination: General exam: Conversant, in no acute distress Respiratory system: normal chest rise, clear, no audible wheezing Cardiovascular system: regular rhythm, s1-s2 Gastrointestinal system: Nondistended, nontender, pos  BS Central nervous system: No seizures, no tremors Extremities: No cyanosis, no joint deformities Skin: No rashes, no pallor Psychiatry: Affect normal // no auditory hallucinations   Data Reviewed: I have personally reviewed following labs and imaging studies  CBC: Recent Labs  Lab 07/06/21 1657 07/07/21 0035 07/08/21 0500 07/09/21 0502  WBC 11.4* 6.3 5.9 4.2  NEUTROABS 9.8*  --  3.6  --   HGB 12.4 10.9* 9.3* 9.6*  HCT 38.2 33.7* 29.3* 30.7*  MCV 91.8 91.6 93.0 93.3  PLT 217 181 164 202    Basic Metabolic Panel: Recent Labs  Lab 07/06/21 1657 07/07/21 0035 07/08/21 0500 07/09/21 0502  NA 139 135 136 138  K 2.9* 3.1* 3.4* 3.7  CL 99 102 102 105  CO2 _0 GLUCOSE 114* 127* 95 88  BUN 6 5* <5* <5*  CREATININE 0.96 0.95 0.63 0.69  CALCIUM 9.1 8.0* 7.8* 8.0*  MG 1.6* 2.0 1.6*  --   PHOS  --   --  2.3*  --     GFR: Estimated Creatinine Clearance: 81.4 mL/min (by C-G formula based on SCr of 0.69 mg/dL). Liver Function Tests: Recent Labs  Lab 07/06/21 1657 07/08/21 0500 07/09/21 0502  AST 18  --  15  ALT 13  --  11  ALKPHOS 52  --  31*  BILITOT 0.5  --  0.4  PROT 7.6  --  5.5*  ALBUMIN 3.9 2.7* 2.7*    Recent Labs  Lab 07/06/21 1657  LIPASE 25    No results for input(s): AMMONIA in the last 168 hours. Coagulation Profile: No results for input(s): INR, PROTIME in the last 168 hours. Cardiac Enzymes: No results for input(s): CKTOTAL, CKMB, CKMBINDEX, TROPONINI in the last 168 hours. BNP (last 3 results) No results for input(s): PROBNP in the last 8760 hours. HbA1C: No results for input(s): HGBA1C in the last 72 hours. CBG: No results for input(s): GLUCAP in the last 168 hours. Lipid Profile: No results for input(s): CHOL, HDL, LDLCALC, TRIG, CHOLHDL, LDLDIRECT in the last 72 hours. Thyroid Function Tests: No results for input(s): TSH, T4TOTAL, FREET4, T3FREE, THYROIDAB in the last 72 hours. Anemia Panel: No results for input(s):  VITAMINB12, FOLATE, FERRITIN, TIBC, IRON, RETICCTPCT in the last 72 hours. Sepsis Labs: Recent Labs  Lab 07/07/21 0035  PROCALCITON <0.10  LATICACIDVEN 1.7     Recent Results (from the past 240 hour(s))  C Difficile Quick Screen w PCR reflex     Status: None   Collection Time: 07/06/21  4:57 PM   Specimen: Stool  Result Value Ref Range Status   C Diff antigen NEGATIVE NEGATIVE Final   C Diff toxin NEGATIVE NEGATIVE Final   C Diff interpretation No C. difficile detected.  Final    Comment: Performed at [facility], [place] [address] [place], [place] [address]  Resp Panel by RT-PCR (Flu A&B, Covid) Nasopharyngeal Swab     Status: None   Collection Time: 07/06/21  6:52 PM   Specimen: Nasopharyngeal Swab; Nasopharyngeal(Rita Cole) swabs in vial transport medium  Result Value  Ref Range Status   SARS Coronavirus 2 by RT PCR NEGATIVE NEGATIVE Final    Comment: (NOTE) SARS-CoV-2 target nucleic acids are NOT DETECTED.  The SARS-CoV-2 RNA is generally detectable in upper respiratory specimens during the acute phase of infection. The lowest concentration of SARS-CoV-2 viral copies this assay can detect is 138 copies/mL. A negative result does not preclude SARS-Cov-2 infection and should not be used as the sole basis for treatment or other patient management decisions. A negative result may occur with  improper specimen collection/handling, submission of specimen other than nasopharyngeal swab, presence of viral mutation(s) within the areas targeted by this assay, and inadequate number of viral copies(<138 copies/mL). A negative result must be combined with clinical observations, patient history, and epidemiological information. The expected result is Negative.  Fact Sheet for Patients:  EntrepreneurPulse.com.au  Fact Sheet for Healthcare Providers:  IncredibleEmployment.be  This test is no t yet approved or cleared by the [place]  FDA and  has been authorized for detection and/or diagnosis of SARS-CoV-2 by FDA under an Emergency Use Authorization (EUA). This EUA will remain  in effect (meaning this test can be used) for the duration of the COVID-19 declaration under Section 564(b)(1) of the Act, 21 U.S.C.section 360bbb-3(b)(1), unless the authorization is terminated  or revoked sooner.       Influenza A by PCR NEGATIVE NEGATIVE Final   Influenza B by PCR NEGATIVE NEGATIVE Final    Comment: (NOTE) The Xpert Xpress SARS-CoV-2/FLU/RSV plus assay is intended as an aid in the diagnosis of influenza from Nasopharyngeal swab specimens and should not be used as a sole basis for treatment. Nasal washings and aspirates are unacceptable for Xpert Xpress SARS-CoV-2/FLU/RSV testing.  Fact Sheet for Patients: EntrepreneurPulse.com.au  Fact Sheet for Healthcare Providers: IncredibleEmployment.be  This test is not yet approved or cleared by the [place] FDA and has been authorized for detection and/or diagnosis of SARS-CoV-2 by FDA under an Emergency Use Authorization (EUA). This EUA will remain in effect (meaning this test can be used) for the duration of the COVID-19 declaration under Section 564(b)(1) of the Act, 21 U.S.C. section 360bbb-3(b)(1), unless the authorization is terminated or revoked.  Performed at [facility], [place] [address] [place], [place] [address]   Culture, blood (routine x 2)     Status: None (Preliminary result)   Collection Time: 07/07/21 12:35 AM   Specimen: BLOOD RIGHT WRIST  Result Value Ref Range Status   Specimen Description   Final    BLOOD RIGHT WRIST Performed at [place] [address], [place], [place] [address]    Special Requests   Final    BOTTLES DRAWN AEROBIC ONLY Blood Culture adequate volume Performed at [place] [address] [place], [place] [address]    Culture   Final     NO GROWTH 4 DAYS Performed at [facility], [place] [address] [place], [place] [address]    Report Status PENDING  Incomplete  Culture, blood (routine x 2)     Status: None (Preliminary result)   Collection Time: 07/07/21 12:35 AM   Specimen: BLOOD RIGHT HAND  Result Value Ref Range Status   Specimen Description   Final    BLOOD RIGHT HAND Performed at [facility], [place] [address] [place], [place] [address]    Special Requests   Final    BOTTLES DRAWN AEROBIC ONLY Blood Culture adequate volume Performed at [facility], [address] [place], [place] [address]    Culture   Final    NO GROWTH 4 DAYS Performed at [facility], [place] [address], [place], [place] [address]    Report Status PENDING  Incomplete  Gastrointestinal Panel by PCR , Stool     Status: None   Collection Time: 07/08/21  6:56 PM   Specimen: Stool  Result Value Ref Range Status   Campylobacter species NOT DETECTED NOT DETECTED Final   Plesimonas shigelloides NOT DETECTED NOT DETECTED Final   Salmonella species NOT DETECTED NOT DETECTED Final   Yersinia enterocolitica NOT DETECTED NOT DETECTED Final   Vibrio species NOT DETECTED NOT DETECTED Final   Vibrio cholerae NOT DETECTED NOT DETECTED Final   Enteroaggregative E coli (EAEC) NOT DETECTED NOT DETECTED Final   Enteropathogenic E coli (EPEC) NOT DETECTED NOT DETECTED Final   Enterotoxigenic E coli (ETEC) NOT DETECTED NOT DETECTED Final   Shiga like toxin producing E coli (STEC) NOT DETECTED NOT DETECTED Final   Shigella/Enteroinvasive E coli (EIEC) NOT DETECTED NOT DETECTED Final   Cryptosporidium NOT DETECTED NOT DETECTED Final   Cyclospora cayetanensis NOT DETECTED NOT DETECTED Final   Entamoeba histolytica NOT DETECTED NOT DETECTED Final   Giardia lamblia NOT DETECTED NOT DETECTED Final   Adenovirus F40/41 NOT DETECTED NOT DETECTED Final   Astrovirus NOT DETECTED NOT DETECTED Final   Norovirus GI/GII NOT DETECTED NOT  DETECTED Final   Rotavirus A NOT DETECTED NOT DETECTED Final   Sapovirus (I, II, IV, and V) NOT DETECTED NOT DETECTED Final    Comment: Performed at [facility], [address] [place], [place] [address]      Radiology Studies: No results found.  Scheduled Meds:  methylPREDNISolone (SOLU-MEDROL) injection  40 mg Intravenous Q8H   metoprolol succinate  50 mg Oral Daily   Continuous Infusions:  lactated ringers 10 mL/hr at 07/10/21 1232     LOS: 4 days   Marylu Lund, MD [facility] Pager On Amion  If 7PM-7AM, please contact night-coverage 07/11/2021, 3:46 PM

## 2021-07-12 DIAGNOSIS — K529 Noninfective gastroenteritis and colitis, unspecified: Secondary | ICD-10-CM | POA: Diagnosis not present

## 2021-07-12 LAB — CULTURE, BLOOD (ROUTINE X 2)
Culture: NO GROWTH
Culture: NO GROWTH
Special Requests: ADEQUATE
Special Requests: ADEQUATE

## 2021-07-12 MED ORDER — PANTOPRAZOLE SODIUM 40 MG PO TBEC
40.0000 mg | DELAYED_RELEASE_TABLET | Freq: Every day | ORAL | Status: DC
  Administered 2021-07-12 – 2021-07-13 (×2): 40 mg via ORAL
  Filled 2021-07-12 (×2): qty 1

## 2021-07-12 MED ORDER — HYDRALAZINE HCL 20 MG/ML IJ SOLN
5.0000 mg | Freq: Once | INTRAMUSCULAR | Status: AC
Start: 2021-07-13 — End: 2021-07-12
  Administered 2021-07-12: 5 mg via INTRAVENOUS
  Filled 2021-07-12: qty 1

## 2021-07-12 MED ORDER — CALCIUM CARBONATE ANTACID 500 MG PO CHEW
1.0000 | CHEWABLE_TABLET | Freq: Three times a day (TID) | ORAL | Status: DC | PRN
  Administered 2021-07-12: 200 mg via ORAL
  Filled 2021-07-12: qty 1

## 2021-07-12 MED ORDER — PREDNISONE 20 MG PO TABS
40.0000 mg | ORAL_TABLET | Freq: Every day | ORAL | Status: DC
  Administered 2021-07-13: 40 mg via ORAL
  Filled 2021-07-12: qty 2

## 2021-07-12 NOTE — Progress Notes (Signed)
Subjective: Feeling much better. Much less blood in stool.  Objective: Vital signs in last 24 hours: Temp:  [98 F (36.7 C)-98.3 F (36.8 C)] 98.3 F (36.8 C) (01/15 1448) Pulse Rate:  [54-58] 54 (01/15 1448) Resp:  [16] 16 (01/15 1448) BP: (149-160)/(90-95) 160/90 (01/15 1448) SpO2:  [95 %-99 %] 95 % (01/15 1448) Weight change:  Last BM Date: 07/12/21  PE: GEN:  NAD  Lab Results: CBC    Component Value Date/Time   WBC 4.2 07/09/2021 0502   RBC 3.29 (L) 07/09/2021 0502   HGB 9.6 (L) 07/09/2021 0502   HGB 10.1 (L) 04/14/2021 0810   HCT 30.7 (L) 07/09/2021 0502   PLT 175 07/09/2021 0502   PLT 195 04/14/2021 0810   MCV 93.3 07/09/2021 0502   MCH 29.2 07/09/2021 0502   MCHC 31.3 07/09/2021 0502   RDW 15.3 07/09/2021 0502   LYMPHSABS 0.7 07/08/2021 0500   MONOABS 1.2 (H) 07/08/2021 0500   EOSABS 0.4 07/08/2021 0500   BASOSABS 0.1 07/08/2021 0500   Assessment:   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  Plan:   Start oral steroids tomorrow (prednisone 40 mg po qd).  Soft diet, do not advance.  Awaiting sigmoidoscopy biopsies. Patient likely able to be discharged home tomorrow from GI perspective, pending clinical course. [facility] will follow.   Landry Dyke 07/12/2021, 3:34 PM   Cell [phone] If no answer or after 5 PM call [phone]

## 2021-07-12 NOTE — Progress Notes (Signed)
PROGRESS NOTE    Rita Cole  NID:[id number] DOB: September 19, 1960 DOA: 07/06/2021 PCP: Riki Sheer, NP    Brief Narrative:  61 year old female history of malignant left-sided breast cancer status post neoadjuvant chemotherapy, modified radical mastectomy, on active radiation therapy, pembrolizumab treatment, history of hypertension, recent diagnosis of Crohn's disease presenting to the ED with sepsis felt secondary to colitis.  Stools studies pending.  Patient placed empirically on IV antibiotics.  GI consulted.  Assessment & Plan:   Principal Problem:   Sepsis ([place]) Active Problems:   Malignant neoplasm of upper-outer quadrant of left breast in female, estrogen receptor negative ([place])   Port-A-Cath in place   S/P mastectomy, left   Colitis   Hypokalemia   Hypomagnesemia   Hypertension   #1 sepsis secondary to colitis, POA -Patient presented with sepsis felt secondary to colitis and met criteria for sepsis with fever, tachycardia, tachypnea, CT findings consistent with a colitis. -C. difficile PCR negative. -GI panel is neg -GI following, now s/p flex sig, reviewed. Findings suggestive of inflammatory colitis with steroids started. Pt has noted improvement in symptoms -Per GI, recommendation to advance diet as tolerated -Was initially on empiric abx which have since been d/c'd following flex sig results -Per GI plan for weaning to prednisone taper starting tomorrow  2.  Hypokalemia/hypomagnesemia -Likely secondary to GI losses. -corrected -Continue to follow lytes  3.  History of recently diagnosed Crohn's disease -Patient states diagnosed by GI specialist at [facility], Dr. Gerri Spore. -Was on mesalamine prior to admission -GI following. Now on steroids per above with plan to begin prednisone taper starting tomorrow  4.  Hypertension -Continued on home regimen Toprol-XL. -BP stable at this time  5.  Malignant left-sided breast cancer -Being  followed by oncology, Dr.Gudena, and radiation oncology Dr. Lisbeth Renshaw. -Status post neoadjuvant chemotherapy followed by mastectomy currently receiving radiation and immunotherapy with pembrolizumab. -Oncology was made aware of pt's admit    DVT prophylaxis: SCD's Code Status: Full Family Communication: Pt in room, family at bedside  Status is: Inpatient  Remains inpatient appropriate because: Severity of illness    Consultants:  GI Oncology  Procedures:  Flex sig 07/10/21  Antimicrobials: Anti-infectives (From admission, onward)    Start     Dose/Rate Route Frequency Ordered Stop   07/07/21 2000  cefTRIAXone (ROCEPHIN) 2 g in sodium chloride 0.9 % 100 mL IVPB  Status:  Discontinued        2 g 200 mL/hr over 30 Minutes Intravenous Every 24 hours 07/06/21 2114 07/10/21 1733   07/06/21 1900  cefTRIAXone (ROCEPHIN) 1 g in sodium chloride 0.9 % 100 mL IVPB        1 g 200 mL/hr over 30 Minutes Intravenous  Once 07/06/21 1851 07/06/21 2147   07/06/21 1900  metroNIDAZOLE (FLAGYL) IVPB 500 mg  Status:  Discontinued        500 mg 100 mL/hr over 60 Minutes Intravenous Every 12 hours 07/06/21 1851 07/10/21 1733       Subjective: Reports feeling better  Objective: Vitals:   07/11/21 0530 07/11/21 1457 07/11/21 2059 07/12/21 1448  BP: (!) 151/78 (!) 166/89 (!) 149/95 (!) 160/90  Pulse: (!) 59 64 (!) 58 (!) 54  Resp: _0 Temp: 98.4 F (36.9 C) 98.6 F (37 C) 98 F (36.7 C) 98.3 F (36.8 C)  TempSrc: Oral Oral Oral Oral  SpO2: 100% 100% 99% 95%  Weight:      Height:  Intake/Output Summary (Last 24 hours) at 07/12/2021 1853 Last data filed at 07/12/2021 1530 Gross per 24 hour  Intake 720 ml  Output --  Net 720 ml    Filed Weights   07/06/21 2257 07/09/21 0456 07/10/21 0442  Weight: 84.4 kg 84.5 kg 84.7 kg    Examination: General exam: Awake, laying in bed, in nad Respiratory system: Normal respiratory effort, no wheezing Cardiovascular system: regular  rate, s1, s2 Gastrointestinal system: Soft, nondistended, positive BS Central nervous system: CN2-12 grossly intact, strength intact Extremities: Perfused, no clubbing Skin: Normal skin turgor, no notable skin lesions seen Psychiatry: Mood normal // no visual hallucinations   Data Reviewed: I have personally reviewed following labs and imaging studies  CBC: Recent Labs  Lab 07/06/21 1657 07/07/21 0035 07/08/21 0500 07/09/21 0502  WBC 11.4* 6.3 5.9 4.2  NEUTROABS 9.8*  --  3.6  --   HGB 12.4 10.9* 9.3* 9.6*  HCT 38.2 33.7* 29.3* 30.7*  MCV 91.8 91.6 93.0 93.3  PLT 217 181 164 161    Basic Metabolic Panel: Recent Labs  Lab 07/06/21 1657 07/07/21 0035 07/08/21 0500 07/09/21 0502  NA 139 135 136 138  K 2.9* 3.1* 3.4* 3.7  CL 99 102 102 105  CO2 _0 GLUCOSE 114* 127* 95 88  BUN 6 5* <5* <5*  CREATININE 0.96 0.95 0.63 0.69  CALCIUM 9.1 8.0* 7.8* 8.0*  MG 1.6* 2.0 1.6*  --   PHOS  --   --  2.3*  --     GFR: Estimated Creatinine Clearance: 81.4 mL/min (by C-G formula based on SCr of 0.69 mg/dL). Liver Function Tests: Recent Labs  Lab 07/06/21 1657 07/08/21 0500 07/09/21 0502  AST 18  --  15  ALT 13  --  11  ALKPHOS 52  --  31*  BILITOT 0.5  --  0.4  PROT 7.6  --  5.5*  ALBUMIN 3.9 2.7* 2.7*    Recent Labs  Lab 07/06/21 1657  LIPASE 25    No results for input(s): AMMONIA in the last 168 hours. Coagulation Profile: No results for input(s): INR, PROTIME in the last 168 hours. Cardiac Enzymes: No results for input(s): CKTOTAL, CKMB, CKMBINDEX, TROPONINI in the last 168 hours. BNP (last 3 results) No results for input(s): PROBNP in the last 8760 hours. HbA1C: No results for input(s): HGBA1C in the last 72 hours. CBG: No results for input(s): GLUCAP in the last 168 hours. Lipid Profile: No results for input(s): CHOL, HDL, LDLCALC, TRIG, CHOLHDL, LDLDIRECT in the last 72 hours. Thyroid Function Tests: No results for input(s): TSH, T4TOTAL,  FREET4, T3FREE, THYROIDAB in the last 72 hours. Anemia Panel: No results for input(s): VITAMINB12, FOLATE, FERRITIN, TIBC, IRON, RETICCTPCT in the last 72 hours. Sepsis Labs: Recent Labs  Lab 07/07/21 0035  PROCALCITON <0.10  LATICACIDVEN 1.7     Recent Results (from the past 240 hour(s))  C Difficile Quick Screen w PCR reflex     Status: None   Collection Time: 07/06/21  4:57 PM   Specimen: Stool  Result Value Ref Range Status   C Diff antigen NEGATIVE NEGATIVE Final   C Diff toxin NEGATIVE NEGATIVE Final   C Diff interpretation No C. difficile detected.  Final    Comment: Performed at [facility], [place] [address] [place], [place] [address]  Resp Panel by RT-PCR (Flu A&B, Covid) Nasopharyngeal Swab     Status: None   Collection Time: 07/06/21  6:52 PM  Specimen: Nasopharyngeal Swab; Nasopharyngeal(NP) swabs in vial transport medium  Result Value Ref Range Status   SARS Coronavirus 2 by RT PCR NEGATIVE NEGATIVE Final    Comment: (NOTE) SARS-CoV-2 target nucleic acids are NOT DETECTED.  The SARS-CoV-2 RNA is generally detectable in upper respiratory specimens during the acute phase of infection. The lowest concentration of SARS-CoV-2 viral copies this assay can detect is 138 copies/mL. A negative result does not preclude SARS-Cov-2 infection and should not be used as the sole basis for treatment or other patient management decisions. A negative result may occur with  improper specimen collection/handling, submission of specimen other than nasopharyngeal swab, presence of viral mutation(s) within the areas targeted by this assay, and inadequate number of viral copies(<138 copies/mL). A negative result must be combined with clinical observations, patient history, and epidemiological information. The expected result is Negative.  Fact Sheet for Patients:  EntrepreneurPulse.com.au  Fact Sheet for Healthcare Providers:   IncredibleEmployment.be  This test is no t yet approved or cleared by the [place] FDA and  has been authorized for detection and/or diagnosis of SARS-CoV-2 by FDA under an Emergency Use Authorization (EUA). This EUA will remain  in effect (meaning this test can be used) for the duration of the COVID-19 declaration under Section 564(b)(1) of the Act, 21 U.S.C.section 360bbb-3(b)(1), unless the authorization is terminated  or revoked sooner.       Influenza A by PCR NEGATIVE NEGATIVE Final   Influenza B by PCR NEGATIVE NEGATIVE Final    Comment: (NOTE) The Xpert Xpress SARS-CoV-2/FLU/RSV plus assay is intended as an aid in the diagnosis of influenza from Nasopharyngeal swab specimens and should not be used as a sole basis for treatment. Nasal washings and aspirates are unacceptable for Xpert Xpress SARS-CoV-2/FLU/RSV testing.  Fact Sheet for Patients: EntrepreneurPulse.com.au  Fact Sheet for Healthcare Providers: IncredibleEmployment.be  This test is not yet approved or cleared by the [place] FDA and has been authorized for detection and/or diagnosis of SARS-CoV-2 by FDA under an Emergency Use Authorization (EUA). This EUA will remain in effect (meaning this test can be used) for the duration of the COVID-19 declaration under Section 564(b)(1) of the Act, 21 U.S.C. section 360bbb-3(b)(1), unless the authorization is terminated or revoked.  Performed at [facility], [place] [address], [place], [place] [address]   Culture, blood (routine x 2)     Status: None   Collection Time: 07/07/21 12:35 AM   Specimen: BLOOD RIGHT WRIST  Result Value Ref Range Status   Specimen Description   Final    BLOOD RIGHT WRIST Performed at [facility], [address], [place], [place] [address]    Special Requests   Final    BOTTLES DRAWN AEROBIC ONLY Blood Culture adequate volume Performed at [place] [address], [place], [place] [address]    Culture   Final    NO GROWTH 5 DAYS Performed at [facility], [place] [address], [place], [place] [address]    Report Status 07/12/2021 FINAL  Final  Culture, blood (routine x 2)     Status: None   Collection Time: 07/07/21 12:35 AM   Specimen: BLOOD RIGHT HAND  Result Value Ref Range Status   Specimen Description   Final    BLOOD RIGHT HAND Performed at [facility], [place] [address], [place], [place] [address]    Special Requests   Final    BOTTLES DRAWN AEROBIC ONLY Blood Culture  adequate volume Performed at [place] [address] [place], [place] [address]    Culture   Final    NO GROWTH 5 DAYS Performed at [facility], [place] [address], [place], [place] [address]    Report Status 07/12/2021 FINAL  Final  Gastrointestinal Panel by PCR , Stool     Status: None   Collection Time: 07/08/21  6:56 PM   Specimen: Stool  Result Value Ref Range Status   Campylobacter species NOT DETECTED NOT DETECTED Final   Plesimonas shigelloides NOT DETECTED NOT DETECTED Final   Salmonella species NOT DETECTED NOT DETECTED Final   Yersinia enterocolitica NOT DETECTED NOT DETECTED Final   Vibrio species NOT DETECTED NOT DETECTED Final   Vibrio cholerae NOT DETECTED NOT DETECTED Final   Enteroaggregative E coli (EAEC) NOT DETECTED NOT DETECTED Final   Enteropathogenic E coli (EPEC) NOT DETECTED NOT DETECTED Final   Enterotoxigenic E coli (ETEC) NOT DETECTED NOT DETECTED Final   Shiga like toxin producing E coli (STEC) NOT DETECTED NOT DETECTED Final   Shigella/Enteroinvasive E coli (EIEC) NOT DETECTED NOT DETECTED Final   Cryptosporidium NOT DETECTED NOT DETECTED Final   Cyclospora cayetanensis NOT DETECTED NOT DETECTED Final   Entamoeba histolytica NOT DETECTED NOT DETECTED Final   Giardia lamblia NOT DETECTED NOT DETECTED Final   Adenovirus F40/41 NOT DETECTED NOT DETECTED Final    Astrovirus NOT DETECTED NOT DETECTED Final   Norovirus GI/GII NOT DETECTED NOT DETECTED Final   Rotavirus A NOT DETECTED NOT DETECTED Final   Sapovirus (I, II, IV, and V) NOT DETECTED NOT DETECTED Final    Comment: Performed at [facility], [address], [place], [place] [address]      Radiology Studies: No results found.  Scheduled Meds:  methylPREDNISolone (SOLU-MEDROL) injection  40 mg Intravenous Q8H   metoprolol succinate  50 mg Oral Daily   pantoprazole  40 mg Oral Daily   [START ON 07/13/2021] predniSONE  40 mg Oral Q breakfast   Continuous Infusions:  lactated ringers 10 mL/hr at 07/10/21 1232     LOS: 5 days   Marylu Lund, MD [facility] Pager On Amion  If 7PM-7AM, please contact night-coverage 07/12/2021, 6:53 PM

## 2021-07-13 ENCOUNTER — Encounter (HOSPITAL_COMMUNITY): Payer: Self-pay | Admitting: Gastroenterology

## 2021-07-13 ENCOUNTER — Ambulatory Visit
Admission: RE | Admit: 2021-07-13 | Discharge: 2021-07-13 | Disposition: A | Discharge status: Home / Self Care | Payer: Federal, State, Local not specified - PPO | Source: Ambulatory Visit | Attending: Radiation Oncology | Admitting: Radiation Oncology

## 2021-07-13 DIAGNOSIS — Z9012 Acquired absence of left breast and nipple: Secondary | ICD-10-CM | POA: Diagnosis not present

## 2021-07-13 DIAGNOSIS — C50412 Malignant neoplasm of upper-outer quadrant of left female breast: Secondary | ICD-10-CM | POA: Diagnosis not present

## 2021-07-13 DIAGNOSIS — Z171 Estrogen receptor negative status [ER-]: Secondary | ICD-10-CM | POA: Diagnosis not present

## 2021-07-13 LAB — CBC
HCT: 33 % — ABNORMAL LOW (ref 36.0–46.0)
Hemoglobin: 10.6 g/dL — ABNORMAL LOW (ref 12.0–15.0)
MCH: 29.3 pg (ref 26.0–34.0)
MCHC: 32.1 g/dL (ref 30.0–36.0)
MCV: 91.2 fL (ref 80.0–100.0)
Platelets: 220 10*3/uL (ref 150–400)
RBC: 3.62 MIL/uL — ABNORMAL LOW (ref 3.87–5.11)
RDW: 15.8 % — ABNORMAL HIGH (ref 11.5–15.5)
WBC: 11.9 10*3/uL — ABNORMAL HIGH (ref 4.0–10.5)
nRBC: 0 % (ref 0.0–0.2)

## 2021-07-13 LAB — COMPREHENSIVE METABOLIC PANEL
ALT: 24 U/L (ref 0–44)
AST: 24 U/L (ref 15–41)
Albumin: 3 g/dL — ABNORMAL LOW (ref 3.5–5.0)
Alkaline Phosphatase: 38 U/L (ref 38–126)
Anion gap: 7 (ref 5–15)
BUN: 20 mg/dL (ref 6–20)
CO2: 28 mmol/L (ref 22–32)
Calcium: 9 mg/dL (ref 8.9–10.3)
Chloride: 104 mmol/L (ref 98–111)
Creatinine, Ser: 0.75 mg/dL (ref 0.44–1.00)
GFR, Estimated: >60 mL/min (ref >60)
Glucose, Bld: 133 mg/dL — ABNORMAL HIGH (ref 70–99)
Potassium: 3.4 mmol/L — ABNORMAL LOW (ref 3.5–5.1)
Sodium: 139 mmol/L (ref 135–145)
Total Bilirubin: 0.3 mg/dL (ref 0.3–1.2)
Total Protein: 5.9 g/dL — ABNORMAL LOW (ref 6.5–8.1)

## 2021-07-13 LAB — SURGICAL PATHOLOGY

## 2021-07-13 MED ORDER — AMLODIPINE BESYLATE 10 MG PO TABS
10.0000 mg | ORAL_TABLET | Freq: Every day | ORAL | Status: DC
  Administered 2021-07-13: 10 mg via ORAL
  Filled 2021-07-13: qty 1

## 2021-07-13 MED ORDER — POTASSIUM CHLORIDE CRYS ER 20 MEQ PO TBCR
40.0000 meq | EXTENDED_RELEASE_TABLET | Freq: Once | ORAL | Status: AC
  Administered 2021-07-13: 40 meq via ORAL
  Filled 2021-07-13: qty 2

## 2021-07-13 MED ORDER — LOSARTAN POTASSIUM 50 MG PO TABS
100.0000 mg | ORAL_TABLET | Freq: Every day | ORAL | Status: DC
  Administered 2021-07-13: 100 mg via ORAL
  Filled 2021-07-13: qty 2

## 2021-07-13 MED ORDER — PREDNISONE 10 MG PO TABS: ORAL_TABLET | ORAL | 0 refills | Status: AC

## 2021-07-13 MED ORDER — PREDNISONE 10 MG PO TABS: 10.0000 mg | ORAL_TABLET | Freq: Every day | ORAL | 0 refills | Status: DC

## 2021-07-13 MED ORDER — PANTOPRAZOLE SODIUM 40 MG PO TBEC: 40.0000 mg | DELAYED_RELEASE_TABLET | Freq: Every day | ORAL | 0 refills | Status: DC

## 2021-07-13 NOTE — Progress Notes (Signed)
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  I am seeing Rita Cole on the day of her hospital discharge.  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  Her diarrhea has much improved she has an occasional loose stool.  She plans on continuing on a steroid taper.  Objective:  Vitals:   07/13/21 0121 07/13/21 1310  BP: (!) 167/72 (!) 150/92  Pulse: 60 (!) 50  Resp:  16  Temp:  97.7 F (36.5 C)  SpO2:  100%    Body mass index is 34.16 kg/m.  Intake/Output Summary (Last 24 hours) at 07/13/2021 1540 Last data filed at 07/13/2021 0252 Gross per 24 hour  Intake 120 ml  Output --  Net 120 ml     Sclerae unicteric  Oropharynx shows no thrush or other lesions  Lungs no rales or wheezes--auscultated anterolaterally  Heart regular rate and rhythm  Abdomen soft, NT, ND  Neuro nonfocal  Breast exam:   CBG (last 3)  No results for input(s): GLUCAP in the last 72 hours.   Labs:  Lab Results  Component Value Date   WBC 11.9 (H) 07/13/2021   HGB 10.6 (L) 07/13/2021   HCT 33.0 (L) 07/13/2021   MCV 91.2 07/13/2021   PLT 220 07/13/2021   NEUTROABS 3.6 07/08/2021    @LASTCHEMISTRY @  Urine Studies No results for input(s): UHGB, CRYS in the last 72 hours.  Invalid input(s): UACOL, UAPR, USPG, UPH, UTP, UGL, UKET, UBIL, UNIT, UROB, [place], UEPI, UWBC, Duwayne Heck [place], [place]  Basic Metabolic Panel: Recent Labs  Lab 07/06/21 1657 07/07/21 0035 07/08/21 0500 07/09/21 0502 07/13/21 0514  NA 139 135 136 138 139  K 2.9* 3.1* 3.4* 3.7 3.4*  CL 99 102 102 105 104  CO2 30 26 26 24 28   GLUCOSE 114* 127* 95 88 133*  BUN 6 5* <5* <5* 20   CREATININE 0.96 0.95 0.63 0.69 0.75  CALCIUM 9.1 8.0* 7.8* 8.0* 9.0  MG 1.6* 2.0 1.6*  --   --   PHOS  --   --  2.3*  --   --    GFR Estimated Creatinine Clearance: 85.3 mL/min (by C-G formula based on SCr of 0.75 mg/dL). Liver Function Tests: Recent Labs  Lab 07/06/21 1657 07/08/21 0500 07/09/21 0502 07/13/21 0514  AST 18  --  15 24  ALT 13  --  11 24  ALKPHOS 52  --  31* 38  BILITOT 0.5  --  0.4 0.3  PROT 7.6  --  5.5* 5.9*  ALBUMIN 3.9 2.7* 2.7* 3.0*   Recent Labs  Lab 07/06/21 1657  LIPASE 25   No results for input(s): AMMONIA in the last 168 hours. Coagulation profile No results for input(s): INR, PROTIME in the last 168 hours.  CBC: Recent Labs  Lab 07/06/21 1657 07/07/21 0035 07/08/21 0500 07/09/21 0502 07/13/21 0514  WBC 11.4* 6.3 5.9 4.2 11.9*  NEUTROABS 9.8*  --  3.6  --   --   HGB 12.4 10.9* 9.3* 9.6* 10.6*  HCT 38.2 33.7* 29.3* 30.7* 33.0*  MCV 91.8 91.6 93.0 93.3 91.2  PLT 217 181 164 175 220   Cardiac Enzymes: No results for input(s): CKTOTAL, CKMB, CKMBINDEX, TROPONINI in the last 168 hours.  BNP: Invalid input(s): POCBNP CBG: No results for input(s): GLUCAP in the last 168 hours. D-Dimer No results for input(s): DDIMER in the last 72 hours. Hgb A1c No results for input(s): HGBA1C in the last 72 hours. Lipid Profile No results for input(s): CHOL, HDL, LDLCALC, TRIG, CHOLHDL, LDLDIRECT in the last 72 hours. Thyroid function studies No results for input(s): TSH, T4TOTAL, T3FREE, THYROIDAB in the last 72 hours.  Invalid input(s): FREET3 Anemia work up No results for input(s): VITAMINB12, FOLATE, FERRITIN, TIBC, IRON, RETICCTPCT in the last 72 hours. Microbiology Recent Results (from the past 240 hour(s))  C Difficile Quick Screen w PCR reflex     Status: None   Collection Time: 07/06/21  4:57 PM   Specimen: Stool  Result Value Ref Range Status   C Diff antigen NEGATIVE NEGATIVE Final   C Diff toxin NEGATIVE NEGATIVE Final   C Diff  interpretation No C. difficile detected.  Final    Comment: Performed at [facility], [place] [address], [place], [place] [address]  Resp Panel by RT-PCR (Flu A&B, Covid) Nasopharyngeal Swab     Status: None   Collection Time: 07/06/21  6:52 PM   Specimen: Nasopharyngeal Swab; Nasopharyngeal(NP) swabs in vial transport medium  Result Value Ref Range Status   SARS Coronavirus 2 by RT PCR NEGATIVE NEGATIVE Final    Comment: (NOTE) SARS-CoV-2 target nucleic acids are NOT DETECTED.  The SARS-CoV-2 RNA is generally detectable in upper respiratory specimens during the acute phase of infection. The lowest concentration of SARS-CoV-2 viral copies this assay can detect is 138 copies/mL. A negative result does not preclude SARS-Cov-2 infection and should not be used as the sole basis for treatment or other patient management decisions. A negative result may occur with  improper specimen collection/handling, submission of specimen other than nasopharyngeal swab, presence of viral mutation(s) within the areas targeted by this assay, and inadequate number of viral copies(<138 copies/mL). A negative result must be combined with clinical observations, patient history, and epidemiological information. The expected result is Negative.  Fact Sheet for Patients:  EntrepreneurPulse.com.au  Fact Sheet for Healthcare Providers:  IncredibleEmployment.be  This test is no t yet approved or cleared by the [place] FDA and  has been authorized for detection and/or diagnosis of SARS-CoV-2 by FDA under an Emergency Use Authorization (EUA). This EUA will remain  in effect (meaning this test can be used) for the duration of the COVID-19 declaration under Section 564(b)(1) of the Act, 21 U.S.C.section 360bbb-3(b)(1), unless the authorization is terminated  or revoked sooner.       Influenza A by PCR NEGATIVE NEGATIVE Final   Influenza B by PCR NEGATIVE  NEGATIVE Final    Comment: (NOTE) The Xpert Xpress SARS-CoV-2/FLU/RSV plus assay is intended as an aid in the diagnosis of influenza from Nasopharyngeal swab specimens and should not be used as a sole basis for treatment. Nasal washings and aspirates are unacceptable for Xpert Xpress SARS-CoV-2/FLU/RSV testing.  Fact Sheet for Patients: EntrepreneurPulse.com.au  Fact Sheet for Healthcare Providers: IncredibleEmployment.be  This test is not yet approved or cleared by the [place] FDA and has been authorized for detection and/or diagnosis of SARS-CoV-2 by FDA under an Emergency Use Authorization (EUA). This EUA will remain in effect (meaning this test can be used) for the duration of the COVID-19 declaration under Section 564(b)(1) of the Act, 21 U.S.C. section 360bbb-3(b)(1), unless the authorization is terminated or revoked.  Performed at [facility], [place] [address]  [place], [place] [address]   Culture, blood (routine x 2)     Status: None   Collection Time: 07/07/21 12:35 AM   Specimen: BLOOD RIGHT WRIST  Result Value Ref Range Status   Specimen Description   Final    BLOOD RIGHT WRIST Performed at [facility], [address] [place], [place] [address]    Special Requests   Final    BOTTLES DRAWN AEROBIC ONLY Blood Culture adequate volume Performed at [place] [address], [place], [place] [address]    Culture   Final    NO GROWTH 5 DAYS Performed at [facility], [place] [address], [place], [place] [address]    Report Status 07/12/2021 FINAL  Final  Culture, blood (routine x 2)     Status: None   Collection Time: 07/07/21 12:35 AM   Specimen: BLOOD RIGHT HAND  Result Value Ref Range Status   Specimen Description   Final    BLOOD RIGHT HAND Performed at [facility], [place] [address] [place], [place] [address]    Special Requests   Final    BOTTLES DRAWN AEROBIC  ONLY Blood Culture adequate volume Performed at [place] [address], [place], [place] [address]    Culture   Final    NO GROWTH 5 DAYS Performed at [facility], [place] [address], [place], [place] [address]    Report Status 07/12/2021 FINAL  Final  Gastrointestinal Panel by PCR , Stool     Status: None   Collection Time: 07/08/21  6:56 PM   Specimen: Stool  Result Value Ref Range Status   Campylobacter species NOT DETECTED NOT DETECTED Final   Plesimonas shigelloides NOT DETECTED NOT DETECTED Final   Salmonella species NOT DETECTED NOT DETECTED Final   Yersinia enterocolitica NOT DETECTED NOT DETECTED Final   Vibrio species NOT DETECTED NOT DETECTED Final   Vibrio cholerae NOT DETECTED NOT DETECTED Final   Enteroaggregative E coli (EAEC) NOT DETECTED NOT DETECTED Final   Enteropathogenic E coli (EPEC) NOT DETECTED NOT DETECTED Final   Enterotoxigenic E coli (ETEC) NOT DETECTED NOT DETECTED Final   Shiga like toxin producing E coli (STEC) NOT DETECTED NOT DETECTED Final   Shigella/Enteroinvasive E coli (EIEC) NOT DETECTED NOT DETECTED Final   Cryptosporidium NOT DETECTED NOT DETECTED Final   Cyclospora cayetanensis NOT DETECTED NOT DETECTED Final   Entamoeba histolytica NOT DETECTED NOT DETECTED Final   Giardia lamblia NOT DETECTED NOT DETECTED Final   Adenovirus F40/41 NOT DETECTED NOT DETECTED Final   Astrovirus NOT DETECTED NOT DETECTED Final   Norovirus GI/GII NOT DETECTED NOT DETECTED Final   Rotavirus A NOT DETECTED NOT DETECTED Final   Sapovirus (I, II, IV, and V) NOT DETECTED NOT DETECTED Final    Comment: Performed at [facility], [address], [place],  [address]      Studies:  No results found.  Assessment/Plan: 61 y.o. 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  She is status post neoadjuvant chemotherapy mastectomy, maintenance pembrolizumab, and adjuvant  radiation which is currently underway.  1.  Left-sided inflammatory breast cancer.  She is currently undergoing treatment with pembrolizumab and adjuvant radiation.  We will stop the pembrolizumab and she will see Dr. Lindi Adie in clinic in follow-up on Friday, July 17, 2020.  We will check her labs on that day as part of her follow-up.  I reviewed with  Rita Cole that going ahead and stopping the pembrolizumab is okay at this point and I highlighted the fact that she has received 9 months of this treatment.  I have discussed continued her pembrolizumab from her treatment orders.  2.  Diarrhea: This is much improved.  She is being discharged with a steroid taper.  She will not receive further pembrolizumab treatment due to this.   The above was reviewed with Dr. Lindi Adie in detail who is in agreement.  We will see her on Friday for labs and follow-up.  Wilber Bihari, NP 07/13/21 3:40 PM Medical Oncology and Hematology [facility] [place], [place] [address] Tel. [phone]    Fax. [phone] .

## 2021-07-13 NOTE — Discharge Summary (Signed)
Physician Discharge Summary  Rita Cole HVF:[id number] DOB: [birth year] DOA: 07/06/2021  PCP: Riki Sheer, NP  Admit date: 07/06/2021 Discharge date: 07/13/2021  Admitted From: Home Disposition:  Home  Recommendations for Outpatient Follow-up:  Follow up with PCP in 1-2 weeks Follow up with GI as scheduled in 4 weeks  Discharge Condition:Improved CODE STATUS:Full Diet recommendation: Soft, advance as tolerated   Brief/Interim Summary: 61 year old female history of malignant left-sided breast cancer status post neoadjuvant chemotherapy, modified radical mastectomy, on active radiation therapy, pembrolizumab treatment, history of hypertension, recent diagnosis of Crohn's disease presenting to the ED with sepsis felt secondary to colitis.  Stools studies pending.  Patient placed empirically on IV antibiotics.  GI consulted.  Discharge Diagnoses:  Principal Problem:   Sepsis ([place]) Active Problems:   Malignant neoplasm of upper-outer quadrant of left breast in female, estrogen receptor negative ([place])   Port-A-Cath in place   S/P mastectomy, left   Colitis   Hypokalemia   Hypomagnesemia   Hypertension   #1 sepsis secondary to colitis, POA -Patient presented with sepsis felt secondary to colitis and met criteria for sepsis with fever, tachycardia, tachypnea, CT findings consistent with a colitis. -C. difficile PCR negative. -GI panel is neg -GI following, now s/p flex sig, reviewed. Findings suggestive of inflammatory colitis with steroids started. Pt has noted improvement in symptoms -Per GI, recommendation to advance diet as tolerated -Was initially on empiric abx which have since been d/c'd following flex sig results -Pt's steroids weaned to prednisone for prolonged taper as outpatient. Cleared for discharge per GI  2.  Hypokalemia/hypomagnesemia -Likely secondary to GI losses. -corrected  3.  History of recently diagnosed Crohn's disease -Patient states  diagnosed by GI specialist at [facility], Dr. Gerri Spore. -Was on mesalamine prior to admission -GI had been following while in hospital, discharged on prednisone  4.  Hypertension -Continued on home regimen Toprol-XL. -Pt's bp began to rise while on steroids -Resume home bp meds on d/c  5.  Malignant left-sided breast cancer -Being followed by oncology, Dr.Gudena, and radiation oncology Dr. Lisbeth Renshaw. -Status post neoadjuvant chemotherapy followed by mastectomy currently receiving radiation and immunotherapy with pembrolizumab. -Oncology was made aware of pt's admit    Discharge Instructions   Allergies as of 07/13/2021       Reactions   Banana         Medication List     STOP taking these medications    mesalamine 1.2 g EC tablet Commonly known as: LIALDA   pyridoxine 100 MG tablet Commonly known as: B-6       TAKE these medications    acetaminophen 500 MG tablet Commonly known as: TYLENOL Take 500-1,000 mg by mouth every 6 (six) hours as needed for mild pain, headache or fever.   amLODipine 10 MG tablet Commonly known as: NORVASC Take 1 tablet (10 mg total) by mouth daily.   Benefiber Powd Take by mouth See admin instructions. Mix 2 teaspoonful of powder into 4-8 ounces of a desired beverage and drink once a day   calcium carbonate 500 MG chewable tablet Commonly known as: TUMS - dosed in mg elemental calcium Chew 1-2 tablets by mouth as needed for indigestion or heartburn.   hydroxypropyl methylcellulose / hypromellose 2.5 % ophthalmic solution Commonly known as: ISOPTO TEARS / GONIOVISC Place 1 drop into both eyes 3 (three) times daily as needed for dry eyes.   KEYTRUDA IV Inject 200 mg into the vein See admin instructions. pembrolizumab (KEYTRUDA) 200  mg in sodium chloride 0.9 % 50 mL chemo infusion- Once @ 116 mL/hr over 30 Minutes   lidocaine-prilocaine cream Commonly known as: EMLA Apply 1 application topically as directed.    losartan 100 MG tablet Commonly known as: COZAAR Take 100 mg by mouth daily.   metoprolol succinate 50 MG 24 hr tablet Commonly known as: TOPROL-XL Take 50 mg by mouth daily.   pantoprazole 40 MG tablet Commonly known as: PROTONIX Take 1 tablet (40 mg total) by mouth daily. Start taking on: July 14, 2021   predniSONE 10 MG tablet Commonly known as: DELTASONE Take 4 tablets (40 mg total) by mouth daily for 7 days, THEN 3 tablets (30 mg total) daily for 7 days, THEN 2 tablets (20 mg total) daily for 7 days. Start taking on: July 13, 2021   predniSONE 10 MG tablet Commonly known as: DELTASONE Take 1 tablet (10 mg total) by mouth daily. Start taking on: August 03, 2021   triamterene-hydrochlorothiazide 37.5-25 MG tablet Commonly known as: MAXZIDE-25 Take 1 tablet by mouth daily.   Vitamin D (Ergocalciferol) 1.25 MG (50000 UNIT) Caps capsule Commonly known as: DRISDOL Take 50,000 Units by mouth every Tuesday.        Follow-up Information     Cullop, Nena Alexander, NP Follow up in 2 week(s).   Specialty: Nurse Practitioner Why: Hospital follow up Contact information: [place] [address] [phone]         Arta Silence, MD Follow up in 4 week(s).   Specialty: Gastroenterology Why: Hospital follow up Contact information: [address] [place] [address] [phone]                Allergies  Allergen Reactions   Banana     Consultations: GI Oncology  Procedures/Studies: CT ABDOMEN PELVIS W CONTRAST  Result Date: 07/06/2021 CLINICAL DATA:  Abdominal pain, acute, nonlocalized EXAM: CT ABDOMEN AND PELVIS WITH CONTRAST TECHNIQUE: Multidetector CT imaging of the abdomen and pelvis was performed using the standard protocol following bolus administration of intravenous contrast. CONTRAST:  124mL OMNIPAQUE IOHEXOL 300 MG/ML  SOLN COMPARISON:  Report from 10/07/2020. Images are unavailable at time of dictation.  FINDINGS: Lower chest: No acute abnormality. Partially imaged left mastectomy. Hepatobiliary: 2 subcentimeter low-density hepatic lesions, which were present previously by report. Gallbladder is unremarkable. No biliary dilatation. Pancreas: Small subcentimeter low-density area near the junction of body and tail of pancreas was present previously but report. Spleen: Unremarkable. Adrenals/Urinary Tract: Approximally 1 cm left adrenal nodule was present on prior study by report. Right adrenals unremarkable. Few too small to characterize low-density renal lesions. Bladder is unremarkable. Stomach/Bowel: Stomach is within normal limits. Bowel is normal in caliber. Fluid and air within the colon. There is mild colonic wall thickening throughout with mild infiltration of adjacent fat. Normal appendix. Vascular/Lymphatic: Mild atherosclerosis.  No enlarged nodes. Reproductive: Anteverted with small calcified fibroids. No adnexal mass. Other: Small fat containing right inguinal hernia. Musculoskeletal: Lower lumbar spine degenerative changes. IMPRESSION: Abnormal appearance of colon likely reflecting infectious/inflammatory colitis. Images from prior study unavailable at time of dictation. Likely no additional substantial change in comparison to the prior study report. Electronically Signed   By: Macy Mis M.D.   On: 07/06/2021 18:30    Subjective: Eager to go home  Discharge Exam: Vitals:   07/13/21 0121 07/13/21 1310  BP: (!) 167/72 (!) 150/92  Pulse: 60 (!) 50  Resp:  16  Temp:  97.7 F (36.5 C)  SpO2:  100%   Vitals:   07/12/21 2308 07/13/21 0121 07/13/21 0500 07/13/21 1310  BP: (!) 181/68 (!) 167/72  (!) 150/92  Pulse: (!) 59 60  (!) 50  Resp:    16  Temp:    97.7 F (36.5 C)  TempSrc:    Oral  SpO2: 97%   100%  Weight:   93.1 kg   Height:        General: Pt is alert, awake, not in acute distress Cardiovascular: RRR, S1/S2  Respiratory: CTA bilaterally, no wheezing, no  rhonchi Abdominal: Soft, NT, ND, bowel sounds + Extremities: no edema, no cyanosis   The results of significant diagnostics from this hospitalization (including imaging, microbiology, ancillary and laboratory) are listed below for reference.     Microbiology: Recent Results (from the past 240 hour(s))  C Difficile Quick Screen w PCR reflex     Status: None   Collection Time: 07/06/21  4:57 PM   Specimen: Stool  Result Value Ref Range Status   C Diff antigen NEGATIVE NEGATIVE Final   C Diff toxin NEGATIVE NEGATIVE Final   C Diff interpretation No C. difficile detected.  Final    Comment: Performed at [facility], [place] [address] [place], [place] [address]  Resp Panel by RT-PCR (Flu A&B, Covid) Nasopharyngeal Swab     Status: None   Collection Time: 07/06/21  6:52 PM   Specimen: Nasopharyngeal Swab; Nasopharyngeal(NP) swabs in vial transport medium  Result Value Ref Range Status   SARS Coronavirus 2 by RT PCR NEGATIVE NEGATIVE Final    Comment: (NOTE) SARS-CoV-2 target nucleic acids are NOT DETECTED.  The SARS-CoV-2 RNA is generally detectable in upper respiratory specimens during the acute phase of infection. The lowest concentration of SARS-CoV-2 viral copies this assay can detect is 138 copies/mL. A negative result does not preclude SARS-Cov-2 infection and should not be used as the sole basis for treatment or other patient management decisions. A negative result may occur with  improper specimen collection/handling, submission of specimen other than nasopharyngeal swab, presence of viral mutation(s) within the areas targeted by this assay, and inadequate number of viral copies(<138 copies/mL). A negative result must be combined with clinical observations, patient history, and epidemiological information. The expected result is Negative.  Fact Sheet for Patients:  EntrepreneurPulse.com.au  Fact Sheet for Healthcare Providers:   IncredibleEmployment.be  This test is no t yet approved or cleared by the [place] FDA and  has been authorized for detection and/or diagnosis of SARS-CoV-2 by FDA under an Emergency Use Authorization (EUA). This EUA will remain  in effect (meaning this test can be used) for the duration of the COVID-19 declaration under Section 564(b)(1) of the Act, 21 U.S.C.section 360bbb-3(b)(1), unless the authorization is terminated  or revoked sooner.       Influenza A by PCR NEGATIVE NEGATIVE Final   Influenza B by PCR NEGATIVE NEGATIVE Final    Comment: (NOTE) The Xpert Xpress SARS-CoV-2/FLU/RSV plus assay is intended as an aid in the diagnosis of influenza from Nasopharyngeal swab specimens and should not be used as a sole basis for treatment. Nasal washings and aspirates are unacceptable for Xpert Xpress SARS-CoV-2/FLU/RSV testing.  Fact Sheet for Patients: EntrepreneurPulse.com.au  Fact Sheet for Healthcare Providers: IncredibleEmployment.be  This test is not yet approved or cleared by the [place] FDA and has been authorized for detection and/or diagnosis of SARS-CoV-2 by FDA under an Emergency Use Authorization (EUA). This EUA will remain in effect (meaning  this test can be used) for the duration of the COVID-19 declaration under Section 564(b)(1) of the Act, 21 U.S.C. section 360bbb-3(b)(1), unless the authorization is terminated or revoked.  Performed at [facility], [place] [address] [place], [place] [address]   Culture, blood (routine x 2)     Status: None   Collection Time: 07/07/21 12:35 AM   Specimen: BLOOD RIGHT WRIST  Result Value Ref Range Status   Specimen Description   Final    BLOOD RIGHT WRIST Performed at [facility], [address] [place], [place] [address]    Special Requests   Final    BOTTLES DRAWN AEROBIC ONLY Blood Culture adequate volume Performed at [place] [address] [place], [place] [address]    Culture   Final    NO GROWTH 5 DAYS Performed at [facility], [place] [address], [place], [place] [address]    Report Status 07/12/2021 FINAL  Final  Culture, blood (routine x 2)     Status: None   Collection Time: 07/07/21 12:35 AM   Specimen: BLOOD RIGHT HAND  Result Value Ref Range Status   Specimen Description   Final    BLOOD RIGHT HAND Performed at [facility], [place] [address], [place], [place] [address]    Special Requests   Final    BOTTLES DRAWN AEROBIC ONLY Blood Culture adequate volume Performed at [place] [address] [place], [place] [address]    Culture   Final    NO GROWTH 5 DAYS Performed at [facility], [place] [address] [place], [place] [address]    Report Status 07/12/2021 FINAL  Final  Gastrointestinal Panel by PCR , Stool     Status: None   Collection Time: 07/08/21  6:56 PM   Specimen: Stool  Result Value Ref Range Status   Campylobacter species NOT DETECTED NOT DETECTED Final   Plesimonas shigelloides NOT DETECTED NOT DETECTED Final   Salmonella species NOT DETECTED NOT DETECTED Final   Yersinia enterocolitica NOT DETECTED NOT DETECTED Final   Vibrio species NOT DETECTED NOT DETECTED Final   Vibrio cholerae NOT DETECTED NOT DETECTED Final   Enteroaggregative E coli (EAEC) NOT DETECTED NOT DETECTED Final   Enteropathogenic E coli (EPEC) NOT DETECTED NOT DETECTED Final   Enterotoxigenic E coli (ETEC) NOT DETECTED NOT DETECTED Final   Shiga like toxin producing E coli (STEC) NOT DETECTED NOT DETECTED Final   Shigella/Enteroinvasive E coli (EIEC) NOT DETECTED NOT DETECTED Final   Cryptosporidium NOT DETECTED NOT DETECTED Final   Cyclospora cayetanensis NOT DETECTED NOT DETECTED Final   Entamoeba histolytica NOT DETECTED NOT DETECTED Final   Giardia lamblia NOT DETECTED NOT DETECTED Final   Adenovirus F40/41 NOT DETECTED NOT DETECTED Final    Astrovirus NOT DETECTED NOT DETECTED Final   Norovirus GI/GII NOT DETECTED NOT DETECTED Final   Rotavirus A NOT DETECTED NOT DETECTED Final   Sapovirus (I, II, IV, and V) NOT DETECTED NOT DETECTED Final    Comment: Performed at [facility], [address] [place], [place] [address]     Labs: BNP (last 3 results) No results for input(s): BNP in the last 8760 hours. Basic Metabolic Panel: Recent Labs  Lab 07/06/21 1657 07/07/21 0035 07/08/21 0500 07/09/21 0502 07/13/21 0514  NA 139 135 136 138 139  K 2.9* 3.1* 3.4* 3.7 3.4*  CL 99 102 102 105 104  CO2 30 26 26  24 28  GLUCOSE 114* 127* 95 88 133*  BUN 6 5* <5* <5* 20  CREATININE 0.96 0.95 0.63 0.69 0.75  CALCIUM 9.1 8.0* 7.8* 8.0* 9.0  MG 1.6* 2.0 1.6*  --   --   PHOS  --   --  2.3*  --   --    Liver Function Tests: Recent Labs  Lab 07/06/21 1657 07/08/21 0500 07/09/21 0502 07/13/21 0514  AST 18  --  15 24  ALT 13  --  11 24  ALKPHOS 52  --  31* 38  BILITOT 0.5  --  0.4 0.3  PROT 7.6  --  5.5* 5.9*  ALBUMIN 3.9 2.7* 2.7* 3.0*   Recent Labs  Lab 07/06/21 1657  LIPASE 25   No results for input(s): AMMONIA in the last 168 hours. CBC: Recent Labs  Lab 07/06/21 1657 07/07/21 0035 07/08/21 0500 07/09/21 0502 07/13/21 0514  WBC 11.4* 6.3 5.9 4.2 11.9*  NEUTROABS 9.8*  --  3.6  --   --   HGB 12.4 10.9* 9.3* 9.6* 10.6*  HCT 38.2 33.7* 29.3* 30.7* 33.0*  MCV 91.8 91.6 93.0 93.3 91.2  PLT 217 181 164 175 220   Cardiac Enzymes: No results for input(s): CKTOTAL, CKMB, CKMBINDEX, TROPONINI in the last 168 hours. BNP: Invalid input(s): POCBNP CBG: No results for input(s): GLUCAP in the last 168 hours. D-Dimer No results for input(s): DDIMER in the last 72 hours. Hgb A1c No results for input(s): HGBA1C in the last 72 hours. Lipid Profile No results for input(s): CHOL, HDL, LDLCALC, TRIG, CHOLHDL, LDLDIRECT in the last 72 hours. Thyroid function studies No results for input(s): TSH, T4TOTAL,  T3FREE, THYROIDAB in the last 72 hours.  Invalid input(s): FREET3 Anemia work up No results for input(s): VITAMINB12, FOLATE, FERRITIN, TIBC, IRON, RETICCTPCT in the last 72 hours. Urinalysis No results found for: COLORURINE, APPEARANCEUR, [place], [place], [place], [place], [place], [place], PROTEINUR, UROBILINOGEN, NITRITE, LEUKOCYTESUR Sepsis Labs Invalid input(s): PROCALCITONIN,  WBC,  LACTICIDVEN Microbiology Recent Results (from the past 240 hour(s))  C Difficile Quick Screen w PCR reflex     Status: None   Collection Time: 07/06/21  4:57 PM   Specimen: Stool  Result Value Ref Range Status   C Diff antigen NEGATIVE NEGATIVE Final   C Diff toxin NEGATIVE NEGATIVE Final   C Diff interpretation No C. difficile detected.  Final    Comment: Performed at [facility], [place] [address], [place], [place] [address]  Resp Panel by RT-PCR (Flu A&B, Covid) Nasopharyngeal Swab     Status: None   Collection Time: 07/06/21  6:52 PM   Specimen: Nasopharyngeal Swab; Nasopharyngeal(NP) swabs in vial transport medium  Result Value Ref Range Status   SARS Coronavirus 2 by RT PCR NEGATIVE NEGATIVE Final    Comment: (NOTE) SARS-CoV-2 target nucleic acids are NOT DETECTED.  The SARS-CoV-2 RNA is generally detectable in upper respiratory specimens during the acute phase of infection. The lowest concentration of SARS-CoV-2 viral copies this assay can detect is 138 copies/mL. A negative result does not preclude SARS-Cov-2 infection and should not be used as the sole basis for treatment or other patient management decisions. A negative result may occur with  improper specimen collection/handling, submission of specimen other than nasopharyngeal swab, presence of viral mutation(s) within the areas targeted by this assay, and inadequate number of viral copies(<138 copies/mL). A negative result must be combined with clinical observations, patient history, and  epidemiological information. The expected result is Negative.  Fact Sheet for Patients:  EntrepreneurPulse.com.au  Fact Sheet for Healthcare Providers:  IncredibleEmployment.be  This test is no t yet approved or cleared by the [place] FDA and  has been authorized for detection and/or diagnosis of SARS-CoV-2 by FDA under an Emergency Use Authorization (EUA). This EUA will remain  in effect (meaning this test can be used) for the duration of the COVID-19 declaration under Section 564(b)(1) of the Act, 21 U.S.C.section 360bbb-3(b)(1), unless the authorization is terminated  or revoked sooner.       Influenza A by PCR NEGATIVE NEGATIVE Final   Influenza B by PCR NEGATIVE NEGATIVE Final    Comment: (NOTE) The Xpert Xpress SARS-CoV-2/FLU/RSV plus assay is intended as an aid in the diagnosis of influenza from Nasopharyngeal swab specimens and should not be used as a sole basis for treatment. Nasal washings and aspirates are unacceptable for Xpert Xpress SARS-CoV-2/FLU/RSV testing.  Fact Sheet for Patients: EntrepreneurPulse.com.au  Fact Sheet for Healthcare Providers: IncredibleEmployment.be  This test is not yet approved or cleared by the [place] FDA and has been authorized for detection and/or diagnosis of SARS-CoV-2 by FDA under an Emergency Use Authorization (EUA). This EUA will remain in effect (meaning this test can be used) for the duration of the COVID-19 declaration under Section 564(b)(1) of the Act, 21 U.S.C. section 360bbb-3(b)(1), unless the authorization is terminated or revoked.  Performed at [facility], [place] [address], [place], [place] [address]   Culture, blood (routine x 2)     Status: None   Collection Time: 07/07/21 12:35 AM   Specimen: BLOOD RIGHT WRIST  Result Value Ref Range Status   Specimen Description   Final    BLOOD RIGHT WRIST Performed at  [facility], [address], [place], [place] [address]    Special Requests   Final    BOTTLES DRAWN AEROBIC ONLY Blood Culture adequate volume Performed at [place] [address], [place], [place] [address]    Culture   Final    NO GROWTH 5 DAYS Performed at [facility], [place] [address] [place], [place] [address]    Report Status 07/12/2021 FINAL  Final  Culture, blood (routine x 2)     Status: None   Collection Time: 07/07/21 12:35 AM   Specimen: BLOOD RIGHT HAND  Result Value Ref Range Status   Specimen Description   Final    BLOOD RIGHT HAND Performed at [facility], [place] [address], [place], [place] [address]    Special Requests   Final    BOTTLES DRAWN AEROBIC ONLY Blood Culture adequate volume Performed at [place] [address], [place], [place] [address]    Culture   Final    NO GROWTH 5 DAYS Performed at [facility], [place] [address] [place],  [address]    Report Status 07/12/2021 FINAL  Final  Gastrointestinal Panel by PCR , Stool     Status: None   Collection Time: 07/08/21  6:56 PM   Specimen: Stool  Result Value Ref Range Status   Campylobacter species NOT DETECTED NOT DETECTED Final   Plesimonas shigelloides NOT DETECTED NOT DETECTED Final   Salmonella species NOT DETECTED NOT DETECTED Final   Yersinia enterocolitica NOT DETECTED NOT DETECTED Final   Vibrio species NOT DETECTED NOT DETECTED Final   Vibrio cholerae NOT DETECTED NOT DETECTED Final   Enteroaggregative E coli (EAEC) NOT DETECTED NOT DETECTED Final  Enteropathogenic E coli (EPEC) NOT DETECTED NOT DETECTED Final   Enterotoxigenic E coli (ETEC) NOT DETECTED NOT DETECTED Final   Shiga like toxin producing E coli (STEC) NOT DETECTED NOT DETECTED Final   Shigella/Enteroinvasive E coli (EIEC) NOT DETECTED NOT DETECTED Final   Cryptosporidium NOT DETECTED NOT DETECTED Final   Cyclospora cayetanensis NOT DETECTED NOT  DETECTED Final   Entamoeba histolytica NOT DETECTED NOT DETECTED Final   Giardia lamblia NOT DETECTED NOT DETECTED Final   Adenovirus F40/41 NOT DETECTED NOT DETECTED Final   Astrovirus NOT DETECTED NOT DETECTED Final   Norovirus GI/GII NOT DETECTED NOT DETECTED Final   Rotavirus A NOT DETECTED NOT DETECTED Final   Sapovirus (I, II, IV, and V) NOT DETECTED NOT DETECTED Final    Comment: Performed at [facility], [address], [place], [place] [address]   Time spent: 30 min  SIGNED:   Marylu Lund, MD  [facility] 07/13/2021, 2:35 PM  If 7PM-7AM, please contact night-coverage

## 2021-07-13 NOTE — Progress Notes (Signed)
[facility] Gastroenterology Progress Note  Rita Cole 61 y.o. Sep 03, 1960  CC:   Colitis   Subjective: Patient seen and examined at bedside.  Family at bedside.  She is feeling much better.  Diarrhea has resolved but continues to have some loose stools.  Streaks of blood on the tissue paper.  ROS : Negative for chest pain and shortness of breath   Objective: Vital signs in last 24 hours: Vitals:   07/12/21 2308 07/13/21 0121  BP: (!) 181/68 (!) 167/72  Pulse: (!) 59 60  Resp:    Temp:    SpO2: 97%     Physical Exam:  General:  Alert, cooperative, no distress, appears stated age  Head:  Normocephalic, without obvious abnormality, atraumatic  Eyes:  , EOM's intact,   Lungs:   Clear to auscultation bilaterally, respirations unlabored  Heart:  Regular rate and rhythm, S1, S2 normal  Abdomen:   Soft, non-tender, nondistended, bowel sounds present.  No peritoneal sign  Extremities: Extremities normal, atraumatic, no  edema  Pulses: 2+ and symmetric    Lab Results: Recent Labs    07/13/21 0514  NA 139  K 3.4*  CL 104  CO2 28  GLUCOSE 133*  BUN 20  CREATININE 0.75  CALCIUM 9.0   Recent Labs    07/13/21 0514  AST 24  ALT 24  ALKPHOS 38  BILITOT 0.3  PROT 5.9*  ALBUMIN 3.0*   Recent Labs    07/13/21 0514  WBC 11.9*  HGB 10.6*  HCT 33.0*  MCV 91.2  PLT 220   No results for input(s): LABPROT, INR in the last 72 hours.    Assessment/Plan: - Abdominal pain diarrhea and rectal bleeding.  CT scan showed mild colitis.  Flexible sigmoidoscopy on January 13 showed diffuse colitis and scope was advanced to the distal sigmoid colon.  Biopsies pending.  Differentials are inflammatory bowel disease versus checkpoint inhibitor colitis -Malignant neoplasm of left breast. (s/p neoadjuvant chemotherapy, modified radical mastectomy, on active radiation therapy and pembrolizumab treatment),   Recommendation ------------------------ -Started on oral prednisone 40  mg once a day followed by prednisone taper as below. - Patient was advised to contact her primary GI doctor to arrange for follow-up within 4 weeks or call our office to arrange for follow-up with Dr. Paulita Fujita. -Biopsy results are still pending. -Okay to discharge from GI standpoint on prednisone taper. Take prednisone 40 mg for 1 week followed by 30 mg for 1 week followed by 20 mg for 1 week and then 10 mg once a day.  -GI will sign off.  Call us back if needed.  Otis Brace MD, [place] 07/13/2021, 9:07 AM  Contact #  [phone]

## 2021-07-14 ENCOUNTER — Other Ambulatory Visit: Payer: Self-pay

## 2021-07-14 ENCOUNTER — Ambulatory Visit
Admission: RE | Admit: 2021-07-14 | Discharge: 2021-07-14 | Disposition: A | Discharge status: Home / Self Care | Payer: Federal, State, Local not specified - PPO | Source: Ambulatory Visit | Attending: Radiation Oncology | Admitting: Radiation Oncology

## 2021-07-14 DIAGNOSIS — C778 Secondary and unspecified malignant neoplasm of lymph nodes of multiple regions: Secondary | ICD-10-CM | POA: Diagnosis not present

## 2021-07-14 DIAGNOSIS — Z801 Family history of malignant neoplasm of trachea, bronchus and lung: Secondary | ICD-10-CM | POA: Diagnosis not present

## 2021-07-14 DIAGNOSIS — Z8249 Family history of ischemic heart disease and other diseases of the circulatory system: Secondary | ICD-10-CM | POA: Diagnosis not present

## 2021-07-14 DIAGNOSIS — R5383 Other fatigue: Secondary | ICD-10-CM | POA: Diagnosis not present

## 2021-07-14 DIAGNOSIS — Z510 Encounter for antineoplastic radiation therapy: Secondary | ICD-10-CM | POA: Diagnosis present

## 2021-07-14 DIAGNOSIS — Z171 Estrogen receptor negative status [ER-]: Secondary | ICD-10-CM | POA: Diagnosis not present

## 2021-07-14 DIAGNOSIS — Z79899 Other long term (current) drug therapy: Secondary | ICD-10-CM | POA: Diagnosis not present

## 2021-07-14 DIAGNOSIS — Z5112 Encounter for antineoplastic immunotherapy: Secondary | ICD-10-CM | POA: Diagnosis not present

## 2021-07-14 DIAGNOSIS — Z87891 Personal history of nicotine dependence: Secondary | ICD-10-CM | POA: Diagnosis not present

## 2021-07-14 DIAGNOSIS — Z809 Family history of malignant neoplasm, unspecified: Secondary | ICD-10-CM | POA: Diagnosis not present

## 2021-07-14 DIAGNOSIS — C50412 Malignant neoplasm of upper-outer quadrant of left female breast: Secondary | ICD-10-CM | POA: Diagnosis present

## 2021-07-15 ENCOUNTER — Ambulatory Visit
Admission: RE | Admit: 2021-07-15 | Discharge: 2021-07-15 | Disposition: A | Discharge status: Home / Self Care | Payer: Federal, State, Local not specified - PPO | Source: Ambulatory Visit | Attending: Radiation Oncology | Admitting: Radiation Oncology

## 2021-07-15 DIAGNOSIS — Z5112 Encounter for antineoplastic immunotherapy: Secondary | ICD-10-CM | POA: Diagnosis not present

## 2021-07-16 ENCOUNTER — Ambulatory Visit
Admission: RE | Admit: 2021-07-16 | Discharge: 2021-07-16 | Disposition: A | Discharge status: Home / Self Care | Payer: Federal, State, Local not specified - PPO | Source: Ambulatory Visit | Attending: Radiation Oncology | Admitting: Radiation Oncology

## 2021-07-16 ENCOUNTER — Other Ambulatory Visit: Payer: Self-pay

## 2021-07-16 DIAGNOSIS — Z5112 Encounter for antineoplastic immunotherapy: Secondary | ICD-10-CM | POA: Diagnosis not present

## 2021-07-16 NOTE — Progress Notes (Signed)
Patient Care Team: Riki Sheer, NP as PCP - General (Nurse Practitioner) Mauro Kaufmann, RN as Oncology Nurse Navigator Rockwell Germany, RN as Oncology Nurse Navigator Rolm Bookbinder, MD as Consulting Physician (General Surgery) Nicholas Lose, MD as Consulting Physician (Hematology and Oncology) Kyung Rudd, MD as Consulting Physician (Radiation Oncology)  DIAGNOSIS:    ICD-10-CM   1. Malignant neoplasm of upper-outer quadrant of left breast in female, estrogen receptor negative ([place])  C50.412    Z17.1       SUMMARY OF ONCOLOGIC HISTORY: Oncology History  Malignant neoplasm of upper-outer quadrant of left breast in female, estrogen receptor negative ([place])  09/30/2020 Initial Diagnosis   Swollen left breast: Mammogram detected left breast mass UOQ skin thickening with 3 abnormal lymph nodes that are matted and hard, ultrasound 2.7 cm, 2.9 cm, 3 cm and 3 lymph nodes biopsy revealed grade 2-3 IDC with DCIS ER/PR negative HER-2 IHC equivocal FISH pending, Ki-67 60%, lymph node biopsy positive   10/01/2020 Cancer Staging   Staging form: Breast, AJCC 8th Edition - Clinical stage from 10/01/2020: Stage IIIC (cT4b, cN2a, cM0, G3, ER-, PR-, HER2: Equivocal) - Signed by Nicholas Lose, MD on 10/01/2020 Stage prefix: Initial diagnosis Histologic grading system: 3 grade system    10/13/2020 Genetic Testing   Negative hereditary cancer genetic testing: no pathogenic variants detected in [facility] CustomNext-Cancer +RNAinsight Panel.  The report date is October 13, 2020.   The CustomNext-Cancer+RNAinsight panel offered by [facility] includes sequencing and rearrangement analysis for the following 47 genes:  APC, ATM, AXIN2, BARD1, BMPR1A, BRCA1, BRCA2, BRIP1, CDH1, CDK4, CDKN2A, CHEK2, DICER1, EPCAM, GREM1, HOXB13, MEN1, MLH1, MSH2, MSH3, MSH6, MUTYH, NBN, NF1, NF2, NTHL1, PALB2, PMS2, POLD1, POLE, PTEN, RAD51C, RAD51D, RECQL, RET, SDHA, SDHAF2, SDHB, SDHC, SDHD, SMAD4, SMARCA4, STK11, TP53,  TSC1, TSC2, and VHL.  RNA data is routinely analyzed for use in variant interpretation for all genes.   01/13/2021 - 04/14/2021 Chemotherapy   Patient is on Treatment Plan : BREAST Pembrolizumab + AC q21d x 4 cycles / Pembrolizumab + Carboplatin D1 + Paclitaxel D1,8,15 q21d X 4 cycles     05/14/2021 Surgery   Left modified radical mastectomy: Metastatic carcinoma in 3/7 lymph nodes, no residual invasive cancer in the breast, no extranodal extension, ER 0%, PR 0%, HER2 negative, Ki-67 60%   05/14/2021 Cancer Staging   Staging form: Breast, AJCC 8th Edition - Pathologic stage from 05/14/2021: No Stage Recommended (ypT0, pN1a, cM0) - Signed by Gardenia Phlegm, NP on 06/05/2021 Stage prefix: Post-therapy    06/05/2021 - 06/26/2021 Chemotherapy   Patient is on Treatment Plan : BREAST Pembrolizumab q21d       CHIEF COMPLIANT: Cycle 3 Keytruda (discontinuing Keytruda for colitis)  INTERVAL HISTORY: Rita Cole is a 61 y.o. with above-mentioned history of left breast cancer having completed neoadjuvant chemotherapy with Taxol and Keytruda. She presents to the clinic today for treatment.  She was admitted to the hospital for sepsis and colitis.  It is probably due to either Crohn's disease or immune mediated colitis from [facility].  She was finally able to be discharged home and is here for a follow-up visit.  Currently on adjuvant radiation  ALLERGIES:  is allergic to banana.  MEDICATIONS:  Current Outpatient Medications  Medication Sig Dispense Refill   acetaminophen (TYLENOL) 500 MG tablet Take 500-1,000 mg by mouth every 6 (six) hours as needed for mild pain, headache or fever.     amLODipine (NORVASC) 10 MG tablet Take 1  tablet (10 mg total) by mouth daily.     calcium carbonate (TUMS - DOSED IN MG ELEMENTAL CALCIUM) 500 MG chewable tablet Chew 1-2 tablets by mouth as needed for indigestion or heartburn.     hydroxypropyl methylcellulose / hypromellose (ISOPTO TEARS /  GONIOVISC) 2.5 % ophthalmic solution Place 1 drop into both eyes 3 (three) times daily as needed for dry eyes.     lidocaine-prilocaine (EMLA) cream Apply 1 application topically as directed.     losartan (COZAAR) 100 MG tablet Take 100 mg by mouth daily.     metoprolol succinate (TOPROL-XL) 50 MG 24 hr tablet Take 50 mg by mouth daily.     pantoprazole (PROTONIX) 40 MG tablet Take 1 tablet (40 mg total) by mouth daily. 30 tablet 0   Pembrolizumab (KEYTRUDA IV) Inject 200 mg into the vein See admin instructions. pembrolizumab (KEYTRUDA) 200 mg in sodium chloride 0.9 % 50 mL chemo infusion- Once @ 116 mL/hr over 30 Minutes     predniSONE (DELTASONE) 10 MG tablet Take 4 tablets (40 mg total) by mouth daily for 7 days, THEN 3 tablets (30 mg total) daily for 7 days, THEN 2 tablets (20 mg total) daily for 7 days. 63 tablet 0   [START ON 08/03/2021] predniSONE (DELTASONE) 10 MG tablet Take 1 tablet (10 mg total) by mouth daily. 30 tablet 0   triamterene-hydrochlorothiazide (MAXZIDE-25) 37.5-25 MG tablet Take 1 tablet by mouth daily.     Vitamin D, Ergocalciferol, (DRISDOL) 1.25 MG (50000 UNIT) CAPS capsule Take 50,000 Units by mouth every Tuesday.     Wheat Dextrin (BENEFIBER) POWD Take by mouth See admin instructions. Mix 2 teaspoonful of powder into 4-8 ounces of a desired beverage and drink once a day     No current facility-administered medications for this visit.    PHYSICAL EXAMINATION: ECOG PERFORMANCE STATUS: 1 - Symptomatic but completely ambulatory  Vitals:   07/17/21 0833  BP: 134/69  Pulse: 64  Resp: 17  Temp: (!) 97.3 F (36.3 C)  SpO2: 100%   Filed Weights   07/17/21 0833  Weight: 196 lb 6.4 oz (89.1 kg)     LABORATORY DATA:  I have reviewed the data as listed CMP Latest Ref Rng & Units 07/17/2021 07/13/2021 07/09/2021  Glucose 70 - 99 mg/dL 121(H) 133(H) 88  BUN 6 - 20 mg/dL 33(H) 20 <5(L)  Creatinine 0.44 - 1.00 mg/dL 1.13(H) 0.75 0.69  Sodium 135 - 145 mmol/L 139 139 138   Potassium 3.5 - 5.1 mmol/L 3.8 3.4(L) 3.7  Chloride 98 - 111 mmol/L 100 104 105  CO2 22 - 32 mmol/L 32 28 24  Calcium 8.9 - 10.3 mg/dL 9.3 9.0 8.0(L)  Total Protein 6.5 - 8.1 g/dL 6.5 5.9(L) 5.5(L)  Total Bilirubin 0.3 - 1.2 mg/dL 0.3 0.3 0.4  Alkaline Phos 38 - 126 U/L 42 38 31(L)  AST 15 - 41 U/L _0 ALT 0 - 44 U/L 37 24 11    Lab Results  Component Value Date   WBC 12.6 (H) 07/17/2021   HGB 11.4 (L) 07/17/2021   HCT 34.4 (L) 07/17/2021   MCV 87.3 07/17/2021   PLT 249 07/17/2021   NEUTROABS 11.4 (H) 07/17/2021    ASSESSMENT & PLAN:  Malignant neoplasm of upper-outer quadrant of left breast in female, estrogen receptor negative ([place]) 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 Neoadjuvant chemotherapy with Adriamycin and Cytoxan dose dense 4 followed by Taxol weekly 12 with carboplatin every 3 weeks x4 (pembrolizumab will be added if she is triple negative) 2. 05/14/2021:Left modified radical mastectomy: Metastatic carcinoma in 3/7 lymph nodes, no residual invasive cancer in the breast, no extranodal extension, ER 0%, PR 0%, HER2 negative, Ki-67 60% 3. 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 Paulita Fujita in the hospital and will call and make an  appointment to see them.  Currently they are on prednisone and would like his opinion as to how to take the prednisone going forward.  She still has at least 4 loose stools per day.  She also has a question of whether or not she needs to take mesalamine.  She has a prescription given before by her primary care physician and has not started it YET.   Return to clinic at the end of radiation for survivorship care plan visit  No orders of the defined types were placed in this encounter.  The patient has a good understanding of the overall plan. she agrees with it. she will call with any problems that may develop before the next visit here.  Total time spent: 30 mins including face to face time and time spent for planning, charting and coordination of care  Rulon Eisenmenger, MD, MPH 07/17/2021  I, Thana Ates, am acting as scribe for Dr. Nicholas Lose.  I have reviewed the above documentation for accuracy and completeness, and I agree with the above.

## 2021-07-16 NOTE — Assessment & Plan Note (Signed)
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 Neoadjuvant chemotherapy with Adriamycin and Cytoxan dose dense 4 followed by Taxol weekly 12 with carboplatin every 3 weeks x4(pembrolizumab will be added if she is triple negative) 2. 05/14/2021:Left modified radical mastectomy: Metastatic carcinoma in 3/7 lymph nodes, no residual invasive cancer in the breast, no extranodal extension, ER 0%, PR 0%, HER2 negative, Ki-67 60% 3. 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

## 2021-07-17 ENCOUNTER — Ambulatory Visit
Admission: RE | Admit: 2021-07-17 | Discharge: 2021-07-17 | Disposition: A | Discharge status: Home / Self Care | Payer: Federal, State, Local not specified - PPO | Source: Ambulatory Visit | Attending: Radiation Oncology | Admitting: Radiation Oncology

## 2021-07-17 ENCOUNTER — Inpatient Hospital Stay: Payer: Federal, State, Local not specified - PPO | Admitting: Hematology and Oncology

## 2021-07-17 ENCOUNTER — Inpatient Hospital Stay: Payer: Federal, State, Local not specified - PPO | Attending: Hematology and Oncology

## 2021-07-17 ENCOUNTER — Inpatient Hospital Stay: Payer: Federal, State, Local not specified - PPO

## 2021-07-17 DIAGNOSIS — Z79899 Other long term (current) drug therapy: Secondary | ICD-10-CM | POA: Diagnosis not present

## 2021-07-17 DIAGNOSIS — C778 Secondary and unspecified malignant neoplasm of lymph nodes of multiple regions: Secondary | ICD-10-CM | POA: Diagnosis not present

## 2021-07-17 DIAGNOSIS — C50412 Malignant neoplasm of upper-outer quadrant of left female breast: Secondary | ICD-10-CM

## 2021-07-17 DIAGNOSIS — Z171 Estrogen receptor negative status [ER-]: Secondary | ICD-10-CM | POA: Diagnosis not present

## 2021-07-17 DIAGNOSIS — K5090 Crohn's disease, unspecified, without complications: Secondary | ICD-10-CM | POA: Diagnosis not present

## 2021-07-17 DIAGNOSIS — Z95828 Presence of other vascular implants and grafts: Secondary | ICD-10-CM

## 2021-07-17 DIAGNOSIS — Z5112 Encounter for antineoplastic immunotherapy: Secondary | ICD-10-CM | POA: Diagnosis not present

## 2021-07-17 LAB — CBC WITH DIFFERENTIAL/PLATELET
Abs Immature Granulocytes: 0.05 10*3/uL (ref 0.00–0.07)
Basophils Absolute: 0 10*3/uL (ref 0.0–0.1)
Basophils Relative: 0 %
Eosinophils Absolute: 0 10*3/uL (ref 0.0–0.5)
Eosinophils Relative: 0 %
HCT: 34.4 % — ABNORMAL LOW (ref 36.0–46.0)
Hemoglobin: 11.4 g/dL — ABNORMAL LOW (ref 12.0–15.0)
Immature Granulocytes: 0 %
Lymphocytes Relative: 3 %
Lymphs Abs: 0.4 10*3/uL — ABNORMAL LOW (ref 0.7–4.0)
MCH: 28.9 pg (ref 26.0–34.0)
MCHC: 33.1 g/dL (ref 30.0–36.0)
MCV: 87.3 fL (ref 80.0–100.0)
Monocytes Absolute: 0.7 10*3/uL (ref 0.1–1.0)
Monocytes Relative: 5 %
Neutro Abs: 11.4 10*3/uL — ABNORMAL HIGH (ref 1.7–7.7)
Neutrophils Relative %: 92 %
Platelets: 249 10*3/uL (ref 150–400)
RBC: 3.94 MIL/uL (ref 3.87–5.11)
RDW: 16.6 % — ABNORMAL HIGH (ref 11.5–15.5)
WBC: 12.6 10*3/uL — ABNORMAL HIGH (ref 4.0–10.5)
nRBC: 0 % (ref 0.0–0.2)

## 2021-07-17 LAB — COMPREHENSIVE METABOLIC PANEL
ALT: 37 U/L (ref 0–44)
AST: 19 U/L (ref 15–41)
Albumin: 3.6 g/dL (ref 3.5–5.0)
Alkaline Phosphatase: 42 U/L (ref 38–126)
Anion gap: 7 (ref 5–15)
BUN: 33 mg/dL — ABNORMAL HIGH (ref 6–20)
CO2: 32 mmol/L (ref 22–32)
Calcium: 9.3 mg/dL (ref 8.9–10.3)
Chloride: 100 mmol/L (ref 98–111)
Creatinine, Ser: 1.13 mg/dL — ABNORMAL HIGH (ref 0.44–1.00)
GFR, Estimated: 56 mL/min — ABNORMAL LOW (ref >60)
Glucose, Bld: 121 mg/dL — ABNORMAL HIGH (ref 70–99)
Potassium: 3.8 mmol/L (ref 3.5–5.1)
Sodium: 139 mmol/L (ref 135–145)
Total Bilirubin: 0.3 mg/dL (ref 0.3–1.2)
Total Protein: 6.5 g/dL (ref 6.5–8.1)

## 2021-07-17 LAB — TSH: TSH: 0.673 u[IU]/mL (ref 0.308–3.960)

## 2021-07-17 MED ORDER — SODIUM CHLORIDE 0.9% FLUSH
10.0000 mL | Freq: Once | INTRAVENOUS | Status: AC
  Administered 2021-07-17: 10 mL

## 2021-07-18 LAB — T4: T4, Total: 8.4 ug/dL (ref 4.5–12.0)

## 2021-07-20 ENCOUNTER — Other Ambulatory Visit: Payer: Self-pay

## 2021-07-20 ENCOUNTER — Ambulatory Visit
Admission: RE | Admit: 2021-07-20 | Discharge: 2021-07-20 | Disposition: A | Discharge status: Home / Self Care | Payer: Federal, State, Local not specified - PPO | Source: Ambulatory Visit | Attending: Radiation Oncology | Admitting: Radiation Oncology

## 2021-07-20 DIAGNOSIS — Z5112 Encounter for antineoplastic immunotherapy: Secondary | ICD-10-CM | POA: Diagnosis not present

## 2021-07-21 ENCOUNTER — Ambulatory Visit
Admission: RE | Admit: 2021-07-21 | Discharge: 2021-07-21 | Disposition: A | Discharge status: Home / Self Care | Payer: Federal, State, Local not specified - PPO | Source: Ambulatory Visit | Attending: Radiation Oncology | Admitting: Radiation Oncology

## 2021-07-21 DIAGNOSIS — Z5112 Encounter for antineoplastic immunotherapy: Secondary | ICD-10-CM | POA: Diagnosis not present

## 2021-07-22 ENCOUNTER — Other Ambulatory Visit: Payer: Self-pay

## 2021-07-22 ENCOUNTER — Ambulatory Visit
Admission: RE | Admit: 2021-07-22 | Discharge: 2021-07-22 | Disposition: A | Discharge status: Home / Self Care | Payer: Federal, State, Local not specified - PPO | Source: Ambulatory Visit | Attending: Radiation Oncology | Admitting: Radiation Oncology

## 2021-07-22 DIAGNOSIS — Z5112 Encounter for antineoplastic immunotherapy: Secondary | ICD-10-CM | POA: Diagnosis not present

## 2021-07-23 ENCOUNTER — Ambulatory Visit
Admission: RE | Admit: 2021-07-23 | Discharge: 2021-07-23 | Disposition: A | Discharge status: Home / Self Care | Payer: Federal, State, Local not specified - PPO | Source: Ambulatory Visit | Attending: Radiation Oncology | Admitting: Radiation Oncology

## 2021-07-23 DIAGNOSIS — Z5112 Encounter for antineoplastic immunotherapy: Secondary | ICD-10-CM | POA: Diagnosis not present

## 2021-07-24 ENCOUNTER — Other Ambulatory Visit: Payer: Self-pay

## 2021-07-24 ENCOUNTER — Ambulatory Visit
Admission: RE | Admit: 2021-07-24 | Discharge: 2021-07-24 | Disposition: A | Discharge status: Home / Self Care | Payer: Federal, State, Local not specified - PPO | Source: Ambulatory Visit | Attending: Radiation Oncology | Admitting: Radiation Oncology

## 2021-07-24 DIAGNOSIS — Z5112 Encounter for antineoplastic immunotherapy: Secondary | ICD-10-CM | POA: Diagnosis not present

## 2021-07-27 ENCOUNTER — Other Ambulatory Visit: Payer: Self-pay

## 2021-07-27 ENCOUNTER — Ambulatory Visit
Admission: RE | Admit: 2021-07-27 | Discharge: 2021-07-27 | Disposition: A | Discharge status: Home / Self Care | Payer: Federal, State, Local not specified - PPO | Source: Ambulatory Visit | Attending: Radiation Oncology | Admitting: Radiation Oncology

## 2021-07-27 DIAGNOSIS — Z5112 Encounter for antineoplastic immunotherapy: Secondary | ICD-10-CM | POA: Diagnosis not present

## 2021-07-28 ENCOUNTER — Ambulatory Visit
Admission: RE | Admit: 2021-07-28 | Discharge: 2021-07-28 | Disposition: A | Discharge status: Home / Self Care | Payer: Federal, State, Local not specified - PPO | Source: Ambulatory Visit | Attending: Radiation Oncology | Admitting: Radiation Oncology

## 2021-07-28 ENCOUNTER — Encounter: Payer: Self-pay | Admitting: Hematology and Oncology

## 2021-07-28 DIAGNOSIS — Z5112 Encounter for antineoplastic immunotherapy: Secondary | ICD-10-CM | POA: Diagnosis not present

## 2021-07-28 NOTE — Progress Notes (Signed)
Pt has been enrolled w/ the [employer] program for [facility] for $25,000 from 06/28/21 - 06/27/22.  Her copay for Beryle Flock will be $25.

## 2021-07-29 ENCOUNTER — Ambulatory Visit
Admission: RE | Admit: 2021-07-29 | Discharge: 2021-07-29 | Disposition: A | Discharge status: Home / Self Care | Payer: Federal, State, Local not specified - PPO | Source: Ambulatory Visit | Attending: Radiation Oncology | Admitting: Radiation Oncology

## 2021-07-29 ENCOUNTER — Other Ambulatory Visit: Payer: Self-pay

## 2021-07-29 DIAGNOSIS — Z79899 Other long term (current) drug therapy: Secondary | ICD-10-CM | POA: Diagnosis not present

## 2021-07-29 DIAGNOSIS — Z87891 Personal history of nicotine dependence: Secondary | ICD-10-CM | POA: Diagnosis not present

## 2021-07-29 DIAGNOSIS — Z8249 Family history of ischemic heart disease and other diseases of the circulatory system: Secondary | ICD-10-CM | POA: Diagnosis not present

## 2021-07-29 DIAGNOSIS — Z5112 Encounter for antineoplastic immunotherapy: Secondary | ICD-10-CM | POA: Insufficient documentation

## 2021-07-29 DIAGNOSIS — C50412 Malignant neoplasm of upper-outer quadrant of left female breast: Secondary | ICD-10-CM | POA: Diagnosis present

## 2021-07-29 DIAGNOSIS — R5383 Other fatigue: Secondary | ICD-10-CM | POA: Insufficient documentation

## 2021-07-29 DIAGNOSIS — Z171 Estrogen receptor negative status [ER-]: Secondary | ICD-10-CM | POA: Diagnosis not present

## 2021-07-29 DIAGNOSIS — Z809 Family history of malignant neoplasm, unspecified: Secondary | ICD-10-CM | POA: Insufficient documentation

## 2021-07-29 DIAGNOSIS — Z510 Encounter for antineoplastic radiation therapy: Secondary | ICD-10-CM | POA: Diagnosis present

## 2021-07-29 DIAGNOSIS — C778 Secondary and unspecified malignant neoplasm of lymph nodes of multiple regions: Secondary | ICD-10-CM | POA: Insufficient documentation

## 2021-07-29 DIAGNOSIS — Z801 Family history of malignant neoplasm of trachea, bronchus and lung: Secondary | ICD-10-CM | POA: Insufficient documentation

## 2021-07-30 ENCOUNTER — Ambulatory Visit
Admission: RE | Admit: 2021-07-30 | Discharge: 2021-07-30 | Disposition: A | Discharge status: Home / Self Care | Payer: Federal, State, Local not specified - PPO | Source: Ambulatory Visit | Attending: Radiation Oncology | Admitting: Radiation Oncology

## 2021-07-30 DIAGNOSIS — Z5112 Encounter for antineoplastic immunotherapy: Secondary | ICD-10-CM | POA: Diagnosis not present

## 2021-07-31 ENCOUNTER — Ambulatory Visit
Admission: RE | Admit: 2021-07-31 | Discharge: 2021-07-31 | Disposition: A | Discharge status: Home / Self Care | Payer: Federal, State, Local not specified - PPO | Source: Ambulatory Visit | Attending: Radiation Oncology | Admitting: Radiation Oncology

## 2021-07-31 ENCOUNTER — Other Ambulatory Visit: Payer: Self-pay

## 2021-07-31 ENCOUNTER — Ambulatory Visit: Payer: Federal, State, Local not specified - PPO | Admitting: Radiation Oncology

## 2021-07-31 DIAGNOSIS — Z5112 Encounter for antineoplastic immunotherapy: Secondary | ICD-10-CM | POA: Diagnosis not present

## 2021-08-03 ENCOUNTER — Ambulatory Visit
Admission: RE | Admit: 2021-08-03 | Discharge: 2021-08-03 | Disposition: A | Discharge status: Home / Self Care | Payer: Federal, State, Local not specified - PPO | Source: Ambulatory Visit | Attending: Radiation Oncology | Admitting: Radiation Oncology

## 2021-08-03 ENCOUNTER — Other Ambulatory Visit: Payer: Self-pay

## 2021-08-03 DIAGNOSIS — Z5112 Encounter for antineoplastic immunotherapy: Secondary | ICD-10-CM | POA: Diagnosis not present

## 2021-08-04 ENCOUNTER — Ambulatory Visit: Payer: Federal, State, Local not specified - PPO

## 2021-08-04 ENCOUNTER — Ambulatory Visit
Admission: RE | Admit: 2021-08-04 | Discharge: 2021-08-04 | Disposition: A | Discharge status: Home / Self Care | Payer: Federal, State, Local not specified - PPO | Source: Ambulatory Visit | Attending: Radiation Oncology | Admitting: Radiation Oncology

## 2021-08-04 DIAGNOSIS — Z5112 Encounter for antineoplastic immunotherapy: Secondary | ICD-10-CM | POA: Diagnosis not present

## 2021-08-04 NOTE — Progress Notes (Signed)
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  Intent: Curative  Radiation Treatment Dates:  06/23/2021 through 08/07/2021 Site Technique Total Dose (Gy) Dose per Fx (Gy) Completed Fx Beam Energies  Chest Wall, Left: CW_L 3D 50.4/50.4 1.8 28/28 10X  Chest Wall, Left: CW_L_SCLV 3D 50.4/50.4 1.8 28/28 6X, 10X  Chest Wall, Left: CW_L_Bst Electron 10/10 2 5/5 6E   Narrative: The patient tolerated radiation therapy relatively well. She developed fatigue and anticipated skin changes in the treatment field.   Plan: The patient will receive a call in about one month from the radiation oncology department. She will continue follow up with Dr. Lindi Adie as well.   ________________________________________________    Carola Rhine, [facility]

## 2021-08-05 ENCOUNTER — Ambulatory Visit
Admission: RE | Admit: 2021-08-05 | Discharge: 2021-08-05 | Disposition: A | Discharge status: Home / Self Care | Payer: Federal, State, Local not specified - PPO | Source: Ambulatory Visit | Attending: Radiation Oncology | Admitting: Radiation Oncology

## 2021-08-05 ENCOUNTER — Other Ambulatory Visit: Payer: Self-pay

## 2021-08-05 DIAGNOSIS — Z5112 Encounter for antineoplastic immunotherapy: Secondary | ICD-10-CM | POA: Diagnosis not present

## 2021-08-06 ENCOUNTER — Ambulatory Visit
Admission: RE | Admit: 2021-08-06 | Discharge: 2021-08-06 | Disposition: A | Discharge status: Home / Self Care | Payer: Federal, State, Local not specified - PPO | Source: Ambulatory Visit | Attending: Radiation Oncology | Admitting: Radiation Oncology

## 2021-08-06 ENCOUNTER — Encounter: Payer: Self-pay | Admitting: Radiation Oncology

## 2021-08-06 ENCOUNTER — Ambulatory Visit: Payer: Federal, State, Local not specified - PPO

## 2021-08-06 DIAGNOSIS — Z5112 Encounter for antineoplastic immunotherapy: Secondary | ICD-10-CM | POA: Diagnosis not present

## 2021-08-07 ENCOUNTER — Encounter: Payer: Self-pay | Admitting: Adult Health

## 2021-08-07 ENCOUNTER — Other Ambulatory Visit: Payer: Self-pay

## 2021-08-07 ENCOUNTER — Ambulatory Visit: Payer: Federal, State, Local not specified - PPO

## 2021-08-07 ENCOUNTER — Inpatient Hospital Stay (HOSPITAL_BASED_OUTPATIENT_CLINIC_OR_DEPARTMENT_OTHER): Payer: Federal, State, Local not specified - PPO | Admitting: Adult Health

## 2021-08-07 ENCOUNTER — Other Ambulatory Visit: Payer: Federal, State, Local not specified - PPO

## 2021-08-07 ENCOUNTER — Ambulatory Visit
Admission: RE | Admit: 2021-08-07 | Discharge: 2021-08-07 | Disposition: A | Discharge status: Home / Self Care | Payer: Federal, State, Local not specified - PPO | Source: Ambulatory Visit | Attending: Radiation Oncology | Admitting: Radiation Oncology

## 2021-08-07 ENCOUNTER — Encounter: Payer: Self-pay | Admitting: *Deleted

## 2021-08-07 VITALS — BP 118/79 | HR 69 | Temp 98.8°F | Resp 16 | Ht 65.0 in | Wt 198.5 lb

## 2021-08-07 DIAGNOSIS — Z87891 Personal history of nicotine dependence: Secondary | ICD-10-CM | POA: Insufficient documentation

## 2021-08-07 DIAGNOSIS — Z7952 Long term (current) use of systemic steroids: Secondary | ICD-10-CM | POA: Insufficient documentation

## 2021-08-07 DIAGNOSIS — Z171 Estrogen receptor negative status [ER-]: Secondary | ICD-10-CM | POA: Insufficient documentation

## 2021-08-07 DIAGNOSIS — Z8249 Family history of ischemic heart disease and other diseases of the circulatory system: Secondary | ICD-10-CM | POA: Insufficient documentation

## 2021-08-07 DIAGNOSIS — Z801 Family history of malignant neoplasm of trachea, bronchus and lung: Secondary | ICD-10-CM | POA: Insufficient documentation

## 2021-08-07 DIAGNOSIS — C50412 Malignant neoplasm of upper-outer quadrant of left female breast: Secondary | ICD-10-CM

## 2021-08-07 DIAGNOSIS — R197 Diarrhea, unspecified: Secondary | ICD-10-CM | POA: Insufficient documentation

## 2021-08-07 DIAGNOSIS — Z809 Family history of malignant neoplasm, unspecified: Secondary | ICD-10-CM | POA: Insufficient documentation

## 2021-08-07 DIAGNOSIS — Z9012 Acquired absence of left breast and nipple: Secondary | ICD-10-CM | POA: Insufficient documentation

## 2021-08-07 DIAGNOSIS — Z79899 Other long term (current) drug therapy: Secondary | ICD-10-CM | POA: Insufficient documentation

## 2021-08-07 DIAGNOSIS — Z5112 Encounter for antineoplastic immunotherapy: Secondary | ICD-10-CM | POA: Diagnosis not present

## 2021-08-07 DIAGNOSIS — Z1231 Encounter for screening mammogram for malignant neoplasm of breast: Secondary | ICD-10-CM | POA: Diagnosis not present

## 2021-08-08 NOTE — Progress Notes (Signed)
SURVIVORSHIP VISIT:   BRIEF ONCOLOGIC HISTORY:  Oncology History  Malignant neoplasm of upper-outer quadrant of left breast in female, estrogen receptor negative ([place])  09/30/2020 Initial Diagnosis   Swollen left breast: Mammogram detected left breast mass UOQ skin thickening with 3 abnormal lymph nodes that are matted and hard, ultrasound 2.7 cm, 2.9 cm, 3 cm and 3 lymph nodes biopsy revealed grade 2-3 IDC with DCIS ER/PR negative HER-2 IHC equivocal FISH pending, Ki-67 60%, lymph node biopsy positive   10/01/2020 Cancer Staging   Staging form: Breast, AJCC 8th Edition - Clinical stage from 10/01/2020: Stage IIIC (cT4b, cN2a, cM0, G3, ER-, PR-, HER2: Equivocal) - Signed by Nicholas Lose, MD on 10/01/2020 Stage prefix: Initial diagnosis Histologic grading system: 3 grade system    10/13/2020 Genetic Testing   Negative hereditary cancer genetic testing: no pathogenic variants detected in [facility] CustomNext-Cancer +RNAinsight Panel.  The report date is October 13, 2020.   The CustomNext-Cancer+RNAinsight panel offered by [facility] includes sequencing and rearrangement analysis for the following 47 genes:  APC, ATM, AXIN2, BARD1, BMPR1A, BRCA1, BRCA2, BRIP1, CDH1, CDK4, CDKN2A, CHEK2, DICER1, EPCAM, GREM1, HOXB13, MEN1, MLH1, MSH2, MSH3, MSH6, MUTYH, NBN, NF1, NF2, NTHL1, PALB2, PMS2, POLD1, POLE, PTEN, RAD51C, RAD51D, RECQL, RET, SDHA, SDHAF2, SDHB, SDHC, SDHD, SMAD4, SMARCA4, STK11, TP53, TSC1, TSC2, and VHL.  RNA data is routinely analyzed for use in variant interpretation for all genes.   01/13/2021 - 04/14/2021 Chemotherapy   Patient is on Treatment Plan : BREAST Pembrolizumab + AC q21d x 4 cycles / Pembrolizumab + Carboplatin D1 + Paclitaxel D1,8,15 q21d X 4 cycles     05/14/2021 Surgery   Left modified radical mastectomy: Metastatic carcinoma in 3/7 lymph nodes, no residual invasive cancer in the breast, no extranodal extension, ER 0%, PR 0%, HER2 negative, Ki-67 60%   05/14/2021 Cancer Staging    Staging form: Breast, AJCC 8th Edition - Pathologic stage from 05/14/2021: No Stage Recommended (ypT0, pN1a, cM0) - Signed by Gardenia Phlegm, NP on 06/05/2021 Stage prefix: Post-therapy    06/05/2021 - 06/26/2021 Chemotherapy   Patient is on Treatment Plan : BREAST Pembrolizumab q21d       INTERVAL HISTORY:  Ms. Holben to review her survivorship care plan detailing her treatment course for breast cancer, as well as monitoring long-term side effects of that treatment, education regarding health maintenance, screening, and overall wellness and health promotion.     Overall, [name] reports feeling quite well.  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  She says that her diarrhea has much improved, however she does have Crohns, so she continues to have some bowel consistency fluctuations.    She continues to improve and recover from treatment.    REVIEW OF SYSTEMS:  Review of Systems  Constitutional:  Negative for appetite change, chills, fatigue, fever and unexpected weight change.  HENT:   Negative for hearing loss, lump/mass and trouble swallowing.   Eyes:  Negative for eye problems and icterus.  Respiratory:  Negative for chest tightness, cough and shortness of breath.   Cardiovascular:  Negative for chest pain, leg swelling and palpitations.  Gastrointestinal:  Negative for abdominal distention, abdominal pain, constipation, diarrhea, nausea and vomiting.  Endocrine: Negative for hot flashes.  Genitourinary:  Negative for difficulty urinating.   Musculoskeletal:  Negative for arthralgias.  Skin:  Negative for itching and rash.  Neurological:  Negative for dizziness, extremity weakness, headaches and numbness.  Hematological:  Negative  for adenopathy. Does not bruise/bleed easily.  Psychiatric/Behavioral:  Negative for depression. The patient is not nervous/anxious.   Breast: Denies any new  nodularity, masses, tenderness, nipple changes, or nipple discharge.      ONCOLOGY TREATMENT TEAM:  1. Surgeon:  Dr. Donne Hazel at [facility] 2. Medical Oncologist: Dr. Lindi Adie  3. Radiation Oncologist: Dr. Lisbeth Renshaw    PAST MEDICAL/SURGICAL HISTORY:  Past Medical History:  Diagnosis Date   Breast cancer [facility])    Family history of lung cancer 10/02/2020   Hypertension    Pre-diabetes    Past Surgical History:  Procedure Laterality Date   BREAST SURGERY     BUNIONECTOMY     FLEXIBLE SIGMOIDOSCOPY N/A 07/10/2021   Procedure: FLEXIBLE SIGMOIDOSCOPY;  Surgeon: Arta Silence, MD;  Location: [facility];  Service: Endoscopy;  Laterality: N/A;   IR IMAGING GUIDED PORT INSERTION  10/14/2020   MODIFIED MASTECTOMY Left 05/14/2021   Procedure: LEFT MODIFIED RADICAL MASTECTOMY;  Surgeon: Rolm Bookbinder, MD;  Location: [place];  Service: General;  Laterality: Left;   MYOMECTOMY     TONSILLECTOMY     as a child     ALLERGIES:  Allergies  Allergen Reactions   Banana      CURRENT MEDICATIONS:  Outpatient Encounter Medications as of 08/07/2021  Medication Sig Note   acetaminophen (TYLENOL) 500 MG tablet Take 500-1,000 mg by mouth every 6 (six) hours as needed for mild pain, headache or fever.    amLODipine (NORVASC) 10 MG tablet Take 1 tablet (10 mg total) by mouth daily.    calcium carbonate (TUMS - DOSED IN MG ELEMENTAL CALCIUM) 500 MG chewable tablet Chew 1-2 tablets by mouth as needed for indigestion or heartburn.    hydroxypropyl methylcellulose / hypromellose (ISOPTO TEARS / GONIOVISC) 2.5 % ophthalmic solution Place 1 drop into both eyes 3 (three) times daily as needed for dry eyes.    lidocaine-prilocaine (EMLA) cream Apply 1 application topically as directed.    losartan (COZAAR) 100 MG tablet Take 100 mg by mouth daily.    metoprolol succinate (TOPROL-XL) 50 MG 24 hr tablet Take 50 mg by mouth daily.    pantoprazole (PROTONIX) 40 MG tablet Take 1 tablet (40 mg  total) by mouth daily.    Pembrolizumab (KEYTRUDA IV) Inject 200 mg into the vein See admin instructions. pembrolizumab (KEYTRUDA) 200 mg in sodium chloride 0.9 % 50 mL chemo infusion- Once @ 116 mL/hr over 30 Minutes 07/06/2021: Next scheduled dose: 07/17/2021   predniSONE (DELTASONE) 10 MG tablet Take 1 tablet (10 mg total) by mouth daily.    triamterene-hydrochlorothiazide (MAXZIDE-25) 37.5-25 MG tablet Take 1 tablet by mouth daily.    Vitamin D, Ergocalciferol, (DRISDOL) 1.25 MG (50000 UNIT) CAPS capsule Take 50,000 Units by mouth every Tuesday.    Wheat Dextrin (BENEFIBER) POWD Take by mouth See admin instructions. Mix 2 teaspoonful of powder into 4-8 ounces of a desired beverage and drink once a day    [DISCONTINUED] cetirizine (ZYRTEC ALLERGY) 10 MG tablet Take 1 tablet (10 mg total) by mouth daily. (Patient not taking: Reported on 09/06/2020)    [DISCONTINUED] prochlorperazine (COMPAZINE) 10 MG tablet Take 1 tablet (10 mg total) by mouth every 6 (six) hours as needed (Nausea or vomiting).    No facility-administered encounter medications on file as of 08/07/2021.     ONCOLOGIC FAMILY HISTORY:  Family History  Problem Relation Age of Onset   Heart attack Father    Lung cancer Father  dx unknown age   46 Other        MGM's mother; unknown type; dx unknown age     GENETIC COUNSELING/TESTINGsee above  SOCIAL HISTORY:  Social History   Socioeconomic History   Marital status: Divorced    Spouse name: Not on file   Number of children: Not on file   Years of education: Not on file   Highest education level: Not on file  Occupational History   Not on file  Tobacco Use   Smoking status: Former    Packs/day: 0.25    Types: Cigarettes   Smokeless tobacco: Never  Vaping Use   Vaping Use: Never used  Substance and Sexual Activity   Alcohol use: No   Drug use: No   Sexual activity: Yes  Other Topics Concern   Not on file  Social History Narrative   Not on file    Social Determinants of Health   Financial Resource Strain: Not on file  Food Insecurity: Not on file  Transportation Needs: Not on file  Physical Activity: Not on file  Stress: Not on file  Social Connections: Not on file  Intimate Partner Violence: Not on file     OBSERVATIONS/OBJECTIVE:   LABORATORY DATA:  None for this visit.  DIAGNOSTIC IMAGING:  None for this visit.      ASSESSMENT AND PLAN:  Ms.. Frank is a pleasant 61 y.o. 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  She presents to the Survivorship Clinic for our initial meeting and routine follow-up post-completion of treatment for breast cancer.    1. Stage IIIC left breast cancer:  Ms. Mckiernan is continuing to recover from definitive treatment for breast cancer. She will follow-up with her medical oncologist, Dr. Lindi Adie in 6 months with history and physical exam per surveillance protocol. Her mammogram is due 08/2021 of her right breast; orders placed today. Today, a comprehensive survivorship care plan and treatment summary was reviewed with the patient today detailing her breast cancer diagnosis, treatment course, potential late/long-term effects of treatment, appropriate follow-up care with recommendations for the future, and patient education resources.  A copy of this summary, along with a letter will be sent to the patients primary care provider via mail/fax/In Basket message after todays visit.    2. Bone health:  She was given education on specific activities to promote bone health.  3. Cancer screening:  Due to [name]'s history and her age, she should receive screening for skin cancers, colon cancer, and gynecologic cancers.  The information and recommendations are listed on the patient's comprehensive care plan/treatment summary and  were reviewed in detail with the patient.    4. Health maintenance and wellness promotion: Ms. Pickelsimer was encouraged to consume 5-7 servings of fruits and vegetables per day. We reviewed the "Nutrition Rainbow" handout.  She was also encouraged to engage in moderate to vigorous exercise for 30 minutes per day most days of the week. We discussed the [employer] fitness program, which is designed for cancer survivors to help them become more physically fit after cancer treatments.  She was instructed to limit her alcohol consumption and continue to abstain from tobacco use.     5. Support services/counseling: It is not uncommon for this period of the patient's cancer care trajectory to be one of many emotions and stressors.  She was given information regarding our available services and encouraged  to contact me with any questions or for help enrolling in any of our support group/programs.    Follow up instructions:    -Return to cancer center in 6 months for f/u  -Mammogram due in 08/2021 -Follow up with surgery for port removal -She is welcome to return back to the Survivorship Clinic at any time; no additional follow-up needed at this time.  -Consider referral back to survivorship as a long-term survivor for continued surveillance  The patient was provided an opportunity to ask questions and all were answered. The patient agreed with the plan and demonstrated an understanding of the instructions.   Total encounter time: 45 minutes in face-to-face visit time, chart review, lab review, care coordination, order entry, and documentation of the encounter.    Wilber Bihari, NP 08/08/21 7:17 AM Medical Oncology and Hematology [facility] [place], [place] [address] Tel. [phone]    Fax. [phone]  *Total Encounter Time as defined by the Centers for Medicare and Medicaid Services includes, in addition to the face-to-face time of a patient visit  (documented in the note above) non-face-to-face time: obtaining and reviewing outside history, ordering and reviewing medications, tests or procedures, care coordination (communications with other health care professionals or caregivers) and documentation in the medical record. //

## 2021-08-10 ENCOUNTER — Telehealth: Payer: Self-pay | Admitting: Adult Health

## 2021-08-10 ENCOUNTER — Ambulatory Visit: Payer: Federal, State, Local not specified - PPO | Attending: General Surgery

## 2021-08-10 ENCOUNTER — Other Ambulatory Visit: Payer: Self-pay

## 2021-08-10 VITALS — Wt 196.2 lb

## 2021-08-10 DIAGNOSIS — Z483 Aftercare following surgery for neoplasm: Secondary | ICD-10-CM | POA: Insufficient documentation

## 2021-08-10 NOTE — Therapy (Signed)
[facility] @ [address] [place], [place], [address] Phone: [phone]   Fax:  [phone]  Physical Therapy Treatment  Patient Details  Name: Rita Cole MRN: [id number] Date of Birth: 1960/09/19 Referring Provider (PT): Dr. Rolm Bookbinder   Encounter Date: 08/10/2021   PT End of Session - 08/10/21 0953     Visit Number 2   screen only            Past Medical History:  Diagnosis Date   Breast cancer ([place])    Family history of lung cancer 10/02/2020   Hypertension    Pre-diabetes     Past Surgical History:  Procedure Laterality Date   BREAST SURGERY     BUNIONECTOMY     FLEXIBLE SIGMOIDOSCOPY N/A 07/10/2021   Procedure: FLEXIBLE SIGMOIDOSCOPY;  Surgeon: Arta Silence, MD;  Location: [facility];  Service: Endoscopy;  Laterality: N/A;   IR IMAGING GUIDED PORT INSERTION  10/14/2020   MODIFIED MASTECTOMY Left 05/14/2021   Procedure: LEFT MODIFIED RADICAL MASTECTOMY;  Surgeon: Rolm Bookbinder, MD;  Location: [facility];  Service: General;  Laterality: Left;   MYOMECTOMY     TONSILLECTOMY     as a child    Vitals:   08/10/21 0953  Weight: 196 lb 4 oz (89 kg)     Subjective Assessment - 08/10/21 0951     Subjective presents for SOZO screen    Currently in Pain? No/denies                    L-DEX FLOWSHEETS - 08/10/21 0900       L-DEX LYMPHEDEMA SCREENING   Measurement Type Unilateral    L-DEX MEASUREMENT EXTREMITY Upper Extremity    POSITION  Standing    DOMINANT SIDE Right    At Risk Side Left    BASELINE SCORE (UNILATERAL) 16.1    L-DEX SCORE (UNILATERAL) 12.2    VALUE CHANGE (UNILAT) -3.9                                     PT Long Term Goals - 06/04/21 1052       PT LONG TERM GOAL #1   Title Patient will demonstrate she has regained shoulder ROM and function post operatively compared to baselines.    Time 6    Period Months    Status  Achieved                   Plan - 08/10/21 0953     Clinical Impression Statement Pt is WNL for 3 month L-DEX screening today will return for 6 month assessment    PT Next Visit Plan 6 month SOZO             Patient will benefit from skilled therapeutic intervention in order to improve the following deficits and impairments:     Visit Diagnosis: Aftercare following surgery for neoplasm     Problem List Patient Active Problem List   Diagnosis Date Noted   Colitis 07/06/2021   Sepsis ([place]) 07/06/2021   Hypokalemia 07/06/2021   Hypomagnesemia 07/06/2021   Hypertension    S/P mastectomy, left 05/14/2021   Port-A-Cath in place 11/10/2020   Genetic testing 10/13/2020   Family history of lung cancer 10/02/2020   Malignant neoplasm of upper-outer quadrant of left breast in female, estrogen receptor negative ([place]) 09/30/2020    Debbe Bales,  Luther Bradley, PTA 08/10/2021, 9:55 AM  [facility] @ [address] [place], [place], [address] Phone: [phone]   Fax:  [phone]  Name: ELLERY TASH MRN: [id number] Date of Birth: December 08, 1960

## 2021-08-10 NOTE — Telephone Encounter (Signed)
Scheduled appointment per 2/10 los. Patient is aware. Patient will be mailed an updated calendar.

## 2021-08-25 ENCOUNTER — Other Ambulatory Visit: Payer: Self-pay | Admitting: General Surgery

## 2021-08-28 ENCOUNTER — Ambulatory Visit: Payer: Federal, State, Local not specified - PPO

## 2021-08-28 ENCOUNTER — Other Ambulatory Visit: Payer: Federal, State, Local not specified - PPO

## 2021-08-28 ENCOUNTER — Ambulatory Visit: Payer: Federal, State, Local not specified - PPO | Admitting: Hematology and Oncology

## 2021-09-02 ENCOUNTER — Other Ambulatory Visit: Payer: Self-pay

## 2021-09-02 ENCOUNTER — Encounter (HOSPITAL_BASED_OUTPATIENT_CLINIC_OR_DEPARTMENT_OTHER): Payer: Self-pay | Admitting: General Surgery

## 2021-09-07 ENCOUNTER — Ambulatory Visit
Admission: RE | Admit: 2021-09-07 | Discharge: 2021-09-07 | Disposition: A | Discharge status: Home / Self Care | Payer: Federal, State, Local not specified - PPO | Source: Ambulatory Visit | Attending: Adult Health | Admitting: Adult Health

## 2021-09-07 DIAGNOSIS — C50412 Malignant neoplasm of upper-outer quadrant of left female breast: Secondary | ICD-10-CM

## 2021-09-07 NOTE — Progress Notes (Signed)
?  Radiation Oncology         [phone] ?________________________________ ? ?Name: Rita Cole MRN: [id number]  ?Date of Service: 09/07/2021  DOB: 02/25/1961 ? ?Post Treatment Telephone Note ? 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 ? ?Intent: Curative ? ?Radiation Treatment Dates:  ?06/23/2021 through 08/07/2021 ?Site Technique Total Dose (Gy) Dose per Fx (Gy) Completed Fx Beam Energies  ?Chest Wall, Left: CW_L 3D 50.4/50.4 1.8 28/28 10X  ?Chest Wall, Left: CW_L_SCLV 3D 50.4/50.4 1.8 28/28 6X, 10X  ?Chest Wall, Left: CW_L_Bst Electron 10/10 2 5/5 6E  ? ?Narrative: The patient tolerated radiation therapy relatively well. She developed fatigue and anticipated skin changes in the treatment field. Today she reports her skin is improved and she continues to use topical lotion provided during treatment.  ? ? ?Impression/Plan: ?1. 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 The patient has been doing well since completion of radiotherapy. We discussed that we would be happy to continue to follow her as needed, but she will also continue to follow up with Dr. Lindi Adie in medical oncology. She was counseled on skin care as well as measures to avoid sun exposure to this area.  ?2. Survivorship. We discussed the importance of survivorship evaluation and encouraged her to attend her upcoming visit with that clinic. ? ? ? ? ? ?Carola Rhine, PAC  ? ? ? ? ? ?

## 2021-09-10 NOTE — Progress Notes (Signed)

## 2021-09-13 NOTE — Anesthesia Preprocedure Evaluation (Addendum)
Anesthesia Evaluation  ?Patient identified by MRN, date of birth, ID band ?Patient awake ? ? ? ?Reviewed: ?Allergy & Precautions, NPO status , Patient's Chart, lab work & pertinent test results ? ?History of Anesthesia Complications ?Negative for: history of anesthetic complications ? ?Airway ?Mallampati: II ? ?TM Distance: >3 FB ?Neck ROM: Full ? ? ? Dental ?  ?Pulmonary ?neg pulmonary ROS, former smoker,  ?  ?Pulmonary exam normal ? ? ? ? ? ? ? Cardiovascular ?hypertension, Pt. on medications and Pt. on home beta blockers ?Normal cardiovascular exam ? ? ?Echo 01/16/21: EF 60-65%, no RWMA, mod LVH, g1dd, normal RVSF/PASP, valves unremarkable ?  ?Neuro/Psych ?negative neurological ROS ?   ? GI/Hepatic ?Neg liver ROS, GERD  ,Crohn's disease ?  ?Endo/Other  ?diabetes (Pre-DM) ? Renal/GU ?negative Renal ROS  ?negative genitourinary ?  ?Musculoskeletal ?negative musculoskeletal ROS ?(+)  ? Abdominal ?  ?Peds ? Hematology ? ?(+) Blood dyscrasia, anemia ,   ?Anesthesia Other Findings ?Breast cancer s/p mastectomy/chemo/XRT ? Reproductive/Obstetrics ? ?  ? ? ? ? ? ? ? ? ? ? ? ? ? ?  ?  ? ? ? ? ? ? ? ?Anesthesia Physical ?Anesthesia Plan ? ?ASA: 2 ? ?Anesthesia Plan: MAC  ? ?Post-op Pain Management: Tylenol PO (pre-op)* and Toradol IV (intra-op)*  ? ?Induction: Intravenous ? ?PONV Risk Score and Plan: 2 and Propofol infusion, TIVA and Treatment may vary due to age or medical condition ? ?Airway Management Planned: Natural Airway, Nasal Cannula and Simple Face Mask ? ?Additional Equipment: None ? ?Intra-op Plan:  ? ?Post-operative Plan:  ? ?Informed Consent: I have reviewed the patients History and Physical, chart, labs and discussed the procedure including the risks, benefits and alternatives for the proposed anesthesia with the patient or authorized representative who has indicated his/her understanding and acceptance.  ? ? ? ? ? ?Plan Discussed with:  ? ?Anesthesia Plan Comments:    ? ? ? ? ? ?Anesthesia Quick Evaluation ? ?

## 2021-09-14 ENCOUNTER — Ambulatory Visit (HOSPITAL_BASED_OUTPATIENT_CLINIC_OR_DEPARTMENT_OTHER)
Admission: RE | Admit: 2021-09-14 | Discharge: 2021-09-14 | Disposition: A | Discharge status: Home / Self Care | Payer: Federal, State, Local not specified - PPO | Attending: General Surgery | Admitting: General Surgery

## 2021-09-14 ENCOUNTER — Ambulatory Visit (HOSPITAL_BASED_OUTPATIENT_CLINIC_OR_DEPARTMENT_OTHER): Payer: Federal, State, Local not specified - PPO | Admitting: Anesthesiology

## 2021-09-14 ENCOUNTER — Other Ambulatory Visit: Payer: Self-pay

## 2021-09-14 ENCOUNTER — Encounter (HOSPITAL_BASED_OUTPATIENT_CLINIC_OR_DEPARTMENT_OTHER)
Admission: RE | Disposition: A | Discharge status: Home / Self Care | Payer: Self-pay | Source: Home / Self Care | Attending: General Surgery

## 2021-09-14 ENCOUNTER — Encounter (HOSPITAL_BASED_OUTPATIENT_CLINIC_OR_DEPARTMENT_OTHER): Payer: Self-pay | Admitting: General Surgery

## 2021-09-14 DIAGNOSIS — Z853 Personal history of malignant neoplasm of breast: Secondary | ICD-10-CM | POA: Insufficient documentation

## 2021-09-14 DIAGNOSIS — Z452 Encounter for adjustment and management of vascular access device: Secondary | ICD-10-CM | POA: Insufficient documentation

## 2021-09-14 DIAGNOSIS — Z9012 Acquired absence of left breast and nipple: Secondary | ICD-10-CM | POA: Insufficient documentation

## 2021-09-14 DIAGNOSIS — Z79899 Other long term (current) drug therapy: Secondary | ICD-10-CM | POA: Insufficient documentation

## 2021-09-14 DIAGNOSIS — I10 Essential (primary) hypertension: Secondary | ICD-10-CM | POA: Diagnosis not present

## 2021-09-14 DIAGNOSIS — K219 Gastro-esophageal reflux disease without esophagitis: Secondary | ICD-10-CM | POA: Insufficient documentation

## 2021-09-14 DIAGNOSIS — K5090 Crohn's disease, unspecified, without complications: Secondary | ICD-10-CM | POA: Diagnosis not present

## 2021-09-14 DIAGNOSIS — Z923 Personal history of irradiation: Secondary | ICD-10-CM | POA: Diagnosis not present

## 2021-09-14 DIAGNOSIS — Z9221 Personal history of antineoplastic chemotherapy: Secondary | ICD-10-CM | POA: Diagnosis not present

## 2021-09-14 DIAGNOSIS — R7303 Prediabetes: Secondary | ICD-10-CM | POA: Diagnosis not present

## 2021-09-14 DIAGNOSIS — Z87891 Personal history of nicotine dependence: Secondary | ICD-10-CM | POA: Diagnosis not present

## 2021-09-14 HISTORY — DX: Crohn's disease of large intestine without complications: K50.10

## 2021-09-14 HISTORY — PX: PORT-A-CATH REMOVAL: SHX5289

## 2021-09-14 HISTORY — DX: Gastro-esophageal reflux disease without esophagitis: K21.9

## 2021-09-14 SURGERY — REMOVAL PORT-A-CATH
Anesthesia: Monitor Anesthesia Care | Site: Chest | Laterality: Right

## 2021-09-14 MED ORDER — OXYCODONE HCL 5 MG/5ML PO SOLN: 5.0000 mg | Freq: Once | ORAL | Status: DC | PRN

## 2021-09-14 MED ORDER — BUPIVACAINE HCL (PF) 0.25 % IJ SOLN
INTRAMUSCULAR | Status: DC | PRN
  Administered 2021-09-14: 4 mL

## 2021-09-14 MED ORDER — PROPOFOL 10 MG/ML IV BOLUS
INTRAVENOUS | Status: AC
  Filled 2021-09-14: qty 20

## 2021-09-14 MED ORDER — FENTANYL CITRATE (PF) 100 MCG/2ML IJ SOLN: 25.0000 ug | INTRAMUSCULAR | Status: DC | PRN

## 2021-09-14 MED ORDER — LIDOCAINE-EPINEPHRINE (PF) 1 %-1:200000 IJ SOLN
INTRAMUSCULAR | Status: DC | PRN
  Administered 2021-09-14: 4 mL

## 2021-09-14 MED ORDER — ACETAMINOPHEN 500 MG PO TABS
ORAL_TABLET | ORAL | Status: AC
  Filled 2021-09-14: qty 2

## 2021-09-14 MED ORDER — ACETAMINOPHEN 500 MG PO TABS
1000.0000 mg | ORAL_TABLET | ORAL | Status: AC
  Administered 2021-09-14: 1000 mg via ORAL

## 2021-09-14 MED ORDER — ONDANSETRON HCL 4 MG/2ML IJ SOLN
INTRAMUSCULAR | Status: DC | PRN
  Administered 2021-09-14: 4 mg via INTRAVENOUS

## 2021-09-14 MED ORDER — AMISULPRIDE (ANTIEMETIC) 5 MG/2ML IV SOLN: 10.0000 mg | Freq: Once | INTRAVENOUS | Status: DC | PRN

## 2021-09-14 MED ORDER — PROPOFOL 500 MG/50ML IV EMUL
INTRAVENOUS | Status: AC
  Filled 2021-09-14: qty 50

## 2021-09-14 MED ORDER — FENTANYL CITRATE (PF) 100 MCG/2ML IJ SOLN
INTRAMUSCULAR | Status: DC | PRN
  Administered 2021-09-14: 50 ug via INTRAVENOUS

## 2021-09-14 MED ORDER — CHLORHEXIDINE GLUCONATE CLOTH 2 % EX PADS: 6.0000 | MEDICATED_PAD | Freq: Once | CUTANEOUS | Status: DC

## 2021-09-14 MED ORDER — FENTANYL CITRATE (PF) 100 MCG/2ML IJ SOLN
INTRAMUSCULAR | Status: AC
Start: 2021-09-14 — End: ?
  Filled 2021-09-14: qty 2

## 2021-09-14 MED ORDER — ONDANSETRON HCL 4 MG/2ML IJ SOLN: 4.0000 mg | Freq: Once | INTRAMUSCULAR | Status: DC | PRN

## 2021-09-14 MED ORDER — ACETAMINOPHEN 500 MG PO TABS: 1000.0000 mg | ORAL_TABLET | Freq: Once | ORAL | Status: DC

## 2021-09-14 MED ORDER — LIDOCAINE-EPINEPHRINE (PF) 1 %-1:200000 IJ SOLN
INTRAMUSCULAR | Status: AC
  Filled 2021-09-14: qty 30

## 2021-09-14 MED ORDER — MIDAZOLAM HCL 2 MG/2ML IJ SOLN
INTRAMUSCULAR | Status: AC
  Filled 2021-09-14: qty 2

## 2021-09-14 MED ORDER — BUPIVACAINE HCL (PF) 0.25 % IJ SOLN
INTRAMUSCULAR | Status: AC
  Filled 2021-09-14: qty 30

## 2021-09-14 MED ORDER — MIDAZOLAM HCL 5 MG/5ML IJ SOLN
INTRAMUSCULAR | Status: DC | PRN
  Administered 2021-09-14: 2 mg via INTRAVENOUS

## 2021-09-14 MED ORDER — PROPOFOL 500 MG/50ML IV EMUL
INTRAVENOUS | Status: DC | PRN
  Administered 2021-09-14: 75 ug/kg/min via INTRAVENOUS

## 2021-09-14 MED ORDER — CHLORHEXIDINE GLUCONATE CLOTH 2 % EX PADS
6.0000 | MEDICATED_PAD | Freq: Once | CUTANEOUS | Status: DC
Start: 2021-09-14 — End: 2021-09-14

## 2021-09-14 MED ORDER — ONDANSETRON HCL 4 MG/2ML IJ SOLN
INTRAMUSCULAR | Status: AC
  Filled 2021-09-14: qty 2

## 2021-09-14 MED ORDER — OXYCODONE HCL 5 MG PO TABS: 5.0000 mg | ORAL_TABLET | Freq: Once | ORAL | Status: DC | PRN

## 2021-09-14 MED ORDER — LACTATED RINGERS IV SOLN: INTRAVENOUS | Status: DC

## 2021-09-14 MED ORDER — PROPOFOL 10 MG/ML IV BOLUS
INTRAVENOUS | Status: DC | PRN
  Administered 2021-09-14: 30 mg via INTRAVENOUS

## 2021-09-14 MED ORDER — BUPIVACAINE HCL (PF) 0.25 % IJ SOLN
INTRAMUSCULAR | Status: AC
  Filled 2021-09-14: qty 180

## 2021-09-14 SURGICAL SUPPLY — 36 items
ADH SKN CLS APL DERMABOND .7 (GAUZE/BANDAGES/DRESSINGS) ×1
APL PRP STRL LF DISP 70% ISPRP (MISCELLANEOUS) ×1
BLADE SURG 15 STRL LF DISP TIS (BLADE) ×2 IMPLANT
BLADE SURG 15 STRL SS (BLADE) ×2
CHLORAPREP W/TINT 26 (MISCELLANEOUS) ×3 IMPLANT
COVER BACK TABLE 60X90IN (DRAPES) ×3 IMPLANT
COVER MAYO STAND STRL (DRAPES) ×3 IMPLANT
DERMABOND ADVANCED (GAUZE/BANDAGES/DRESSINGS) ×1
DERMABOND ADVANCED .7 DNX12 (GAUZE/BANDAGES/DRESSINGS) ×2 IMPLANT
DRAPE LAPAROTOMY 100X72 PEDS (DRAPES) ×3 IMPLANT
DRAPE UTILITY XL STRL (DRAPES) ×3 IMPLANT
ELECT COATED BLADE 2.86 ST (ELECTRODE) ×3 IMPLANT
ELECT REM PT RETURN 9FT ADLT (ELECTROSURGICAL) ×2
ELECTRODE REM PT RTRN 9FT ADLT (ELECTROSURGICAL) ×2 IMPLANT
GLOVE SURG ENC MOIS LTX SZ7 (GLOVE) ×3 IMPLANT
GLOVE SURG POLYISO LF SZ7 (GLOVE) ×2 IMPLANT
GLOVE SURG UNDER POLY LF SZ6.5 (GLOVE) ×1 IMPLANT
GLOVE SURG UNDER POLY LF SZ7 (GLOVE) ×2 IMPLANT
GLOVE SURG UNDER POLY LF SZ7.5 (GLOVE) ×3 IMPLANT
GOWN STRL REUS W/ TWL LRG LVL3 (GOWN DISPOSABLE) ×4 IMPLANT
GOWN STRL REUS W/ TWL XL LVL3 (GOWN DISPOSABLE) IMPLANT
GOWN STRL REUS W/TWL LRG LVL3 (GOWN DISPOSABLE) ×4
GOWN STRL REUS W/TWL XL LVL3 (GOWN DISPOSABLE) ×2
NDL HYPO 25X1 1.5 SAFETY (NEEDLE) ×2 IMPLANT
NEEDLE HYPO 25X1 1.5 SAFETY (NEEDLE) ×2 IMPLANT
PACK BASIN DAY SURGERY FS (CUSTOM PROCEDURE TRAY) ×3 IMPLANT
PENCIL SMOKE EVACUATOR (MISCELLANEOUS) ×3 IMPLANT
SLEEVE SCD COMPRESS KNEE MED (STOCKING) ×1 IMPLANT
SPIKE FLUID TRANSFER (MISCELLANEOUS) ×3 IMPLANT
SPONGE T-LAP 4X18 ~~LOC~~+RFID (SPONGE) ×3 IMPLANT
STRIP CLOSURE SKIN 1/2X4 (GAUZE/BANDAGES/DRESSINGS) ×3 IMPLANT
SUT MNCRL AB 4-0 PS2 18 (SUTURE) ×3 IMPLANT
SUT VIC AB 3-0 SH 27 (SUTURE) ×2
SUT VIC AB 3-0 SH 27X BRD (SUTURE) ×2 IMPLANT
SYR CONTROL 10ML LL (SYRINGE) ×3 IMPLANT
TOWEL GREEN STERILE FF (TOWEL DISPOSABLE) ×3 IMPLANT

## 2021-09-14 NOTE — Interval H&P Note (Signed)
History and Physical Interval Note: ? ?09/14/2021 ?7:25 AM ? ?Rita Cole  has presented today for surgery, with the diagnosis of BREAST CANCER.  The various methods of treatment have been discussed with the patient and family. After consideration of risks, benefits and other options for treatment, the patient has consented to  Procedure(s): ?REMOVAL PORT-A-CATH (N/A) as a surgical intervention.  The patient's history has been reviewed, patient examined, no change in status, stable for surgery.  I have reviewed the patient's chart and labs.  Questions were answered to the patient's satisfaction.   ? ? ?Rolm Bookbinder ? ? ?

## 2021-09-14 NOTE — Transfer of Care (Signed)
Immediate Anesthesia Transfer of Care Note ? ?Patient: Rita Cole ? ?Procedure(s) Performed: REMOVAL PORT-A-CATH (Right: Chest) ? ?Patient Location: PACU ? ?Anesthesia Type:MAC ? ?Level of Consciousness: drowsy, patient cooperative and responds to stimulation ? ?Airway & Oxygen Therapy: Patient Spontanous Breathing and Patient connected to face mask oxygen ? ?Post-op Assessment: Report given to RN and Post -op Vital signs reviewed and stable ? ?Post vital signs: Reviewed and stable ? ?Last Vitals:  ?Vitals Value Taken Time  ?BP    ?Temp    ?Pulse    ?Resp    ?SpO2    ? ? ?Last Pain:  ?Vitals:  ? 09/14/21 0633  ?TempSrc: Oral  ?PainSc: 0-No pain  ?   ? ?Patients Stated Pain Goal: 4 (09/14/21 8412) ? ?Complications: No notable events documented. ?

## 2021-09-14 NOTE — Anesthesia Postprocedure Evaluation (Signed)
Anesthesia Post Note ? ?Patient: Rita Cole ? ?Procedure(s) Performed: REMOVAL PORT-A-CATH (Right: Chest) ? ?  ? ?Patient location during evaluation: PACU ?Anesthesia Type: MAC ?Level of consciousness: awake and alert ?Pain management: pain level controlled ?Vital Signs Assessment: post-procedure vital signs reviewed and stable ?Respiratory status: spontaneous breathing, nonlabored ventilation and respiratory function stable ?Cardiovascular status: blood pressure returned to baseline and stable ?Postop Assessment: no apparent nausea or vomiting ?Anesthetic complications: no ? ? ?No notable events documented. ? ?Last Vitals:  ?Vitals:  ? 09/14/21 0815 09/14/21 0840  ?BP: 119/67 128/63  ?Pulse: (!) 55 60  ?Resp: (!) 25 16  ?Temp:  36.6 ?C  ?SpO2: 95% 95%  ?  ?Last Pain:  ?Vitals:  ? 09/14/21 0830  ?TempSrc:   ?PainSc: 0-No pain  ? ? ?  ?  ?  ?  ?  ?  ? ?Lidia Collum ? ? ? ? ?

## 2021-09-14 NOTE — H&P (Signed)
Rita Cole is an 61 y.o. female.   ?Chief Complaint: breast cancer no longer needs venous access ?HPI: 61 year old female who had a 5 cm breast mass but non-mass enhancement in all 4 quadrant at least 4 abnormal lymph nodes. This is a triple negative grade 2-3 invasive ductal carcinoma. She underwent primary chemotherapy. She now has undergone a modified radical mastectomy. The breast had a complete response. There were 3 out of 7 lymph nodes which was clearing the entire area that were positive for tumor still. She has completed radiotherapy. She would like port out now.  ? ?Past Medical History:  ?Diagnosis Date  ? Breast cancer ([place])   ? left breast IDC  ? Crohn's colitis ([place])   ? Family history of lung cancer 10/02/2020  ? GERD (gastroesophageal reflux disease)   ? Hypertension   ? Pre-diabetes   ? ? ?Past Surgical History:  ?Procedure Laterality Date  ? BREAST SURGERY    ? BUNIONECTOMY    ? FLEXIBLE SIGMOIDOSCOPY N/A 07/10/2021  ? Procedure: FLEXIBLE SIGMOIDOSCOPY;  Surgeon: Arta Silence, MD;  [facility];  Service: Endoscopy;  Laterality: N/A;  ? IR IMAGING GUIDED PORT INSERTION  10/14/2020  ? MODIFIED MASTECTOMY Left 05/14/2021  ? Procedure: LEFT MODIFIED RADICAL MASTECTOMY;  Surgeon: Rolm Bookbinder, MD;  Location: [facility];  Service: General;  Laterality: Left;  ? MYOMECTOMY    ? TONSILLECTOMY    ? as a child  ? ? ?Family History  ?Problem Relation Age of Onset  ? Heart attack Father   ? Lung cancer Father   ?     dx unknown age  ? Cancer Other   ?     MGM's mother; unknown type; dx unknown age  ? ?Social History:  reports that she has quit smoking. Her smoking use included cigarettes. She smoked an average of .25 packs per day. She has never used smokeless tobacco. She reports that she does not drink alcohol and does not use drugs. ? ?Allergies: No Known Allergies ? ?Medications Prior to Admission  ?Medication Sig Dispense Refill  ? amLODipine (NORVASC) 10 MG tablet Take 1 tablet  (10 mg total) by mouth daily.    ? budesonide (ENTOCORT EC) 3 MG 24 hr capsule Take 9 mg by mouth daily.    ? calcium carbonate (TUMS - DOSED IN MG ELEMENTAL CALCIUM) 500 MG chewable tablet Chew 1-2 tablets by mouth as needed for indigestion or heartburn.    ? losartan (COZAAR) 100 MG tablet Take 100 mg by mouth daily.    ? metoprolol succinate (TOPROL-XL) 50 MG 24 hr tablet Take 50 mg by mouth daily.    ? Vitamin D, Ergocalciferol, (DRISDOL) 1.25 MG (50000 UNIT) CAPS capsule Take 50,000 Units by mouth every Tuesday.    ? Wheat Dextrin (BENEFIBER) POWD Take by mouth See admin instructions. Mix 2 teaspoonful of powder into 4-8 ounces of a desired beverage and drink once a day    ? acetaminophen (TYLENOL) 500 MG tablet Take 500-1,000 mg by mouth every 6 (six) hours as needed for mild pain, headache or fever.    ? hydroxypropyl methylcellulose / hypromellose (ISOPTO TEARS / GONIOVISC) 2.5 % ophthalmic solution Place 1 drop into both eyes 3 (three) times daily as needed for dry eyes.    ? ? ?No results found for this or any previous visit (from the past 48 hour(s)). ?No results found. ? ?Review of Systems  ?All other systems reviewed and are negative. ? ?Blood pressure (!) 144/70,  pulse 69, temperature 98.4 ?F (36.9 ?C), temperature source Oral, resp. rate 17, height '5\' 5"'$  (1.651 m), weight 90.8 kg, SpO2 99 %. ?Physical Exam ?Cardiovascular:  ?   Rate and Rhythm: Normal rate.  ?Pulmonary:  ?   Effort: Pulmonary effort is normal.  ?   Comments: Right chest port in place ?Neurological:  ?   Mental Status: She is alert.  ?  ? ?Assessment/Plan ?Port removal ? ?Rolm Bookbinder, MD ?09/14/2021, 7:24 AM ? ? ? ?

## 2021-09-14 NOTE — Discharge Instructions (Addendum)
? ? ? ? ?  ?Take your usually prescribed medications unless otherwise directed  You may  take acetaminophen (Tylenol) or ibuprofen (Advil) as needed.  You may ice area as needed also ?If you need a refill on your pain medication, please contact our office. All narcotic pain medicine now requires a paper prescription.  Phoned in and fax refills are no longer allowed by law.  Prescriptions will not be filled after 5 pm or on weekends.  ?You should follow a light diet for the remainder of the day after your procedure. Resume regular diet tomorrow.  ?Most patients will experience some mild swelling and/or bruising in the area of the incision. It may take several days to resolve. ?It is common to experience some constipation if taking pain medication after surgery. Increasing fluid intake and taking a stool softener (such as Colace) will usually help or prevent this problem from occurring. A mild laxative (Milk of Magnesia or Miralax) should be taken according to package directions if there are no bowel movements after 48 hours.  ?I used Dermabond (skin glue) on the incision, you may shower in 24 hours.  The glue will flake off over the next 2-3 weeks.  ?ACTIVITIES:  return to normal activity as tolerated ?8.You may need to see your doctor in the office for a follow-up appointment.  Please ?      check with your doctor.  ? ? ?WHEN TO CALL YOUR DOCTOR [phone]): ?Fever over 101.0 ?Chills ?Continued bleeding from incision ?Increased redness and tenderness at the site ?Shortness of breath, difficulty breathing ? ? ?The clinic staff is available to answer your questions during regular business hours. Please don?t hesitate to call and ask to speak to one of the nurses or medical assistants for clinical concerns. If you have a medical emergency, go to the nearest emergency room or call 911.  A surgeon from [facility] is always on call at the hospital.  ? ? ? ?For further information, please visit  [email or web address] ? ? ? ?Post Anesthesia Home Care Instructions ? ?Activity: ?Get plenty of rest for the remainder of the day. A responsible individual must stay with you for 24 hours following the procedure.  ?For the next 24 hours, DO NOT: ?-Drive a car ?-Paediatric nurse ?-Drink alcoholic beverages ?-Take any medication unless instructed by your physician ?-Make any legal decisions or sign important papers. ? ?Meals: ?Start with liquid foods such as gelatin or soup. Progress to regular foods as tolerated. Avoid greasy, spicy, heavy foods. If nausea and/or vomiting occur, drink only clear liquids until the nausea and/or vomiting subsides. Call your physician if vomiting continues. ? ?Special Instructions/Symptoms: ?Your throat may feel dry or sore from the anesthesia or the breathing tube placed in your throat during surgery. If this causes discomfort, gargle with warm salt water. The discomfort should disappear within 24 hours. ? ?If you had a scopolamine patch placed behind your ear for the management of post- operative nausea and/or vomiting: ? ?1. The medication in the patch is effective for 72 hours, after which it should be removed.  Wrap patch in a tissue and discard in the trash. Wash hands thoroughly with soap and water. ?2. You may remove the patch earlier than 72 hours if you experience unpleasant side effects which may include dry mouth, dizziness or visual disturbances. ?3. Avoid touching the patch. Wash your hands with soap and water after contact with the patch. ?    ? ?No tylenol today until after  12:30pm if needed ? ? ? ? ?

## 2021-09-14 NOTE — Anesthesia Procedure Notes (Signed)
Procedure Name: [facility] ?Date/Time: 09/14/2021 7:33 AM ?Performed by: Glory Buff, CRNA ?Pre-anesthesia Checklist: Patient identified, Emergency Drugs available, Suction available and Patient being monitored ?Patient Re-evaluated:Patient Re-evaluated prior to induction ?Oxygen Delivery Method: Simple face mask ? ? ? ? ?

## 2021-09-14 NOTE — Op Note (Signed)
Preoperative diagnosis: breast cancer, no longer needs venous access ?Postoperative diagnosis: Same as above ?Procedure: Right internal jugular port removal ?Surgeon: Dr. Serita Grammes ?Anesthesia: Local mac ?Estimated blood loss: Minimal ?Specimens: None ?Drains: None ?Complications: None ?Special count was correct completion ?Disposition recovery stable edition ? ?Indications: 26yof s/p treatment for breast cancer no longer needs venous access, desires port removal.  ?  ?Procedure: After informed consent was obtained she was taken to the operating room.  She was given antibiotics.  SCDs were in place.  She was placed under MAC. he was prepped and draped in a standard sterile surgical fashion.  Surgical timeout was then performed. ? ?I infiltrated marcaine/lidocaine in her old incision on the chest wall. I then reentered the incision. I removed the port, line and suture material in their entirety.  I then obtained hemostasis.  I then closed with 3-0 Vicryl and 4-0 Monocryl.  Glue was placed.  She tolerated this well and was transferred to recovery. ?

## 2021-09-16 ENCOUNTER — Encounter (HOSPITAL_BASED_OUTPATIENT_CLINIC_OR_DEPARTMENT_OTHER): Payer: Self-pay | Admitting: General Surgery

## 2021-11-02 ENCOUNTER — Ambulatory Visit: Payer: Federal, State, Local not specified - PPO | Attending: General Surgery

## 2021-11-02 VITALS — Wt 205.1 lb

## 2021-11-02 DIAGNOSIS — Z483 Aftercare following surgery for neoplasm: Secondary | ICD-10-CM | POA: Insufficient documentation

## 2021-11-02 NOTE — Therapy (Signed)
?  OUTPATIENT PHYSICAL THERAPY SOZO SCREENING NOTE ? ? ?Patient Name: Rita Cole ?MRN: [id number] ?DOB:[birth year], 61 y.o., female ?Today's Date: 11/02/2021 ? ?PCP: Riki Sheer, NP ?REFERRING PROVIDER: Rolm Bookbinder, MD ? ? PT End of Session - 11/02/21 0944   ? ? Visit Number 2   # unchanged due to screen only  ? PT Start Time [phone]   ? PT Stop Time 0947   ? PT Time Calculation (min) 5 min   ? Activity Tolerance Patient tolerated treatment well   ? Behavior During Therapy [facility] for tasks assessed/performed   ? ?  ?  ? ?  ? ? ?Past Medical History:  ?Diagnosis Date  ? Breast cancer ([place])   ? left breast IDC  ? Crohn's colitis ([place])   ? Family history of lung cancer 10/02/2020  ? GERD (gastroesophageal reflux disease)   ? Hypertension   ? Pre-diabetes   ? ?Past Surgical History:  ?Procedure Laterality Date  ? BREAST SURGERY    ? BUNIONECTOMY    ? FLEXIBLE SIGMOIDOSCOPY N/A 07/10/2021  ? Procedure: FLEXIBLE SIGMOIDOSCOPY;  Surgeon: Arta Silence, MD;  [facility];  Service: Endoscopy;  Laterality: N/A;  ? IR IMAGING GUIDED PORT INSERTION  10/14/2020  ? MODIFIED MASTECTOMY Left 05/14/2021  ? Procedure: LEFT MODIFIED RADICAL MASTECTOMY;  Surgeon: Rolm Bookbinder, MD;  Location: [facility];  Service: General;  Laterality: Left;  ? MYOMECTOMY    ? PORT-A-CATH REMOVAL Right 09/14/2021  ? Procedure: REMOVAL PORT-A-CATH;  Surgeon: Rolm Bookbinder, MD;  Location: [facility];  Service: General;  Laterality: Right;  ? TONSILLECTOMY    ? as a child  ? ?Patient Active Problem List  ? Diagnosis Date Noted  ? Colitis 07/06/2021  ? Sepsis ([place]) 07/06/2021  ? Hypokalemia 07/06/2021  ? Hypomagnesemia 07/06/2021  ? Hypertension   ? S/P mastectomy, left 05/14/2021  ? Port-A-Cath in place 11/10/2020  ? Genetic testing 10/13/2020  ? Family history of lung cancer 10/02/2020  ? Malignant neoplasm of upper-outer quadrant of left breast in female, estrogen receptor negative ([place]) 09/30/2020   ? ? ?REFERRING DIAG: left breast cancer at risk for lymphedema ? ?THERAPY DIAG:  ?Aftercare following surgery for neoplasm ? ?PERTINENT HISTORY: Patient was diagnosed on 09/12/2020 with left grade II-III invasive ductal carcinoma breast cancer. She underwent neoadjuvant chemotherapy from 01/13/2021-04/14/2021 followed by a left modified radical mastectomy on 05/14/2021 with 3 of 7 nodes positive. It is triple negative with a Ki67 of 60%.  ? ?PRECAUTIONS: left UE Lymphedema risk, None ? ?SUBJECTIVE: Pt returns for her 3 month L-Dex screen.  ? ?PAIN:  ?Are you having pain? No ? ?SOZO SCREENING: ?Patient was assessed today using the SOZO machine to determine the lymphedema index score. This was compared to her baseline score. It was determined that she is within the recommended range when compared to her baseline and no further action is needed at this time. She will continue SOZO screenings. These are done every 3 months for 2 years post operatively followed by every 6 months for 2 years, and then annually. ? ? ? ?Otelia Limes, PTA ?11/02/2021, 9:46 AM ? ?  ? ?

## 2022-01-18 ENCOUNTER — Telehealth: Payer: Self-pay | Admitting: *Deleted

## 2022-01-18 ENCOUNTER — Other Ambulatory Visit: Payer: Self-pay

## 2022-01-18 ENCOUNTER — Inpatient Hospital Stay: Payer: Federal, State, Local not specified - PPO | Attending: Adult Health | Admitting: Adult Health

## 2022-01-18 ENCOUNTER — Encounter: Payer: Self-pay | Admitting: Adult Health

## 2022-01-18 VITALS — BP 132/79 | HR 91 | Temp 98.2°F | Resp 17 | Ht 65.0 in | Wt 203.3 lb

## 2022-01-18 DIAGNOSIS — Z171 Estrogen receptor negative status [ER-]: Secondary | ICD-10-CM

## 2022-01-18 DIAGNOSIS — E876 Hypokalemia: Secondary | ICD-10-CM | POA: Insufficient documentation

## 2022-01-18 DIAGNOSIS — Z801 Family history of malignant neoplasm of trachea, bronchus and lung: Secondary | ICD-10-CM | POA: Insufficient documentation

## 2022-01-18 DIAGNOSIS — Z79899 Other long term (current) drug therapy: Secondary | ICD-10-CM | POA: Insufficient documentation

## 2022-01-18 DIAGNOSIS — Z8249 Family history of ischemic heart disease and other diseases of the circulatory system: Secondary | ICD-10-CM | POA: Insufficient documentation

## 2022-01-18 DIAGNOSIS — Z809 Family history of malignant neoplasm, unspecified: Secondary | ICD-10-CM | POA: Insufficient documentation

## 2022-01-18 DIAGNOSIS — Z87891 Personal history of nicotine dependence: Secondary | ICD-10-CM | POA: Diagnosis not present

## 2022-01-18 DIAGNOSIS — C50412 Malignant neoplasm of upper-outer quadrant of left female breast: Secondary | ICD-10-CM | POA: Diagnosis present

## 2022-01-18 DIAGNOSIS — Z853 Personal history of malignant neoplasm of breast: Secondary | ICD-10-CM | POA: Insufficient documentation

## 2022-01-18 NOTE — Telephone Encounter (Signed)
Received call from pt with complaint of left breast pain and swelling x2 days.  Pt denies recent injury or trauma.  Pt requesting to be seen with breast exam.  [facility] visit scheduled, pt verbalized understanding of appt date and time.

## 2022-01-18 NOTE — Assessment & Plan Note (Signed)
Rita Cole is here for f/u of new onselt left chest wall pain, swelling, tenderness and ROM difficulty.  She has no signs of infection.  Since she has a palpable supraclavicular lymph node I have placed orders for CT neck and chest to rule out breast cancer recurrence.   Another consideration is that she is experiencing lymphedema.  Once her CT scans are completed and recurrence ruled out, I will refer her to PT for lymphedema.    For the discomfort I recommended she take aleve or tylenol.    We will see her back in 01/2022 for her routine f/u with Dr. Lindi Adie.

## 2022-01-18 NOTE — Progress Notes (Signed)
[facility] Follow up:    Rita Sheer, NP [place] [address]   DIAGNOSIS:  Cancer Staging  Malignant neoplasm of upper-outer quadrant of left breast in female, estrogen receptor negative ([place]) Staging form: Breast, AJCC 8th Edition - Clinical stage from 10/01/2020: Stage IIIC (cT4b, cN2a, cM0, G3, ER-, PR-, HER2: Equivocal) - Signed by Nicholas Lose, MD on 10/01/2020 Stage prefix: Initial diagnosis Histologic grading system: 3 grade system - Pathologic stage from 05/14/2021: No Stage Recommended (ypT0, pN1a, cM0) - Signed by Gardenia Phlegm, NP on 06/05/2021 Stage prefix: Post-therapy   SUMMARY OF ONCOLOGIC HISTORY: Oncology History  Malignant neoplasm of upper-outer quadrant of left breast in female, estrogen receptor negative ([place])  09/30/2020 Initial Diagnosis   Swollen left breast: Mammogram detected left breast mass UOQ skin thickening with 3 abnormal lymph nodes that are matted and hard, ultrasound 2.7 cm, 2.9 cm, 3 cm and 3 lymph nodes biopsy revealed grade 2-3 IDC with DCIS ER/PR negative HER-2 IHC equivocal FISH pending, Ki-67 60%, lymph node biopsy positive   10/01/2020 Cancer Staging   Staging form: Breast, AJCC 8th Edition - Clinical stage from 10/01/2020: Stage IIIC (cT4b, cN2a, cM0, G3, ER-, PR-, HER2: Equivocal) - Signed by Nicholas Lose, MD on 10/01/2020 Stage prefix: Initial diagnosis Histologic grading system: 3 grade system   10/13/2020 Genetic Testing   Negative hereditary cancer genetic testing: no pathogenic variants detected in [facility]Next-Cancer +RNAinsight Panel.  The report date is October 13, 2020.   The CustomNext-Cancer+RNAinsight panel offered by [facility] includes sequencing and rearrangement analysis for the following 47 genes:  APC, ATM, AXIN2, BARD1, BMPR1A, BRCA1, BRCA2, BRIP1, CDH1, CDK4, CDKN2A, CHEK2, DICER1, EPCAM, GREM1, HOXB13, MEN1, MLH1, MSH2, MSH3, MSH6, MUTYH, NBN, NF1, NF2, NTHL1, PALB2,  PMS2, POLD1, POLE, PTEN, RAD51C, RAD51D, RECQL, RET, SDHA, SDHAF2, SDHB, SDHC, SDHD, SMAD4, SMARCA4, STK11, TP53, TSC1, TSC2, and VHL.  RNA data is routinely analyzed for use in variant interpretation for all genes.   01/13/2021 - 04/14/2021 Chemotherapy   Patient is on Treatment Plan : BREAST Pembrolizumab + AC q21d x 4 cycles / Pembrolizumab + Carboplatin D1 + Paclitaxel D1,8,15 q21d X 4 cycles     05/14/2021 Surgery   Left modified radical mastectomy: Metastatic carcinoma in 3/7 lymph nodes, no residual invasive cancer in the breast, no extranodal extension, ER 0%, PR 0%, HER2 negative, Ki-67 60%   05/14/2021 Cancer Staging   Staging form: Breast, AJCC 8th Edition - Pathologic stage from 05/14/2021: No Stage Recommended (ypT0, pN1a, cM0) - Signed by Gardenia Phlegm, NP on 06/05/2021 Stage prefix: Post-therapy   06/05/2021 - 06/26/2021 Chemotherapy   Patient is on Treatment Plan : BREAST Pembrolizumab q21d       CURRENT THERAPY: observation  INTERVAL HISTORY: Rita Cole 61 y.o. female returns for f/u of new left sided chest wall discomfort and swelling that started a few days ago.  This is progressively worsening.  She notes that she is having difficulty with left arm ROM due to the discomfort.  She denies any recent injuries and has not felt any new lymphadenopathy or masses.  She underwent her right mammo at [facility] that was negative for malignancy per her report--it has not been scanned into our system.    Patient Active Problem List   Diagnosis Date Noted   Colitis 07/06/2021   Sepsis ([place]) 07/06/2021   Hypokalemia 07/06/2021   Hypomagnesemia 07/06/2021   Hypertension    S/P mastectomy, left 05/14/2021  Port-A-Cath in place 11/10/2020   Genetic testing 10/13/2020   Family history of lung cancer 10/02/2020   Malignant neoplasm of upper-outer quadrant of left breast in female, estrogen receptor negative ([place]) 09/30/2020    has No Known Allergies.  MEDICAL  HISTORY: Past Medical History:  Diagnosis Date   Breast cancer ([place])    left breast IDC   Crohn's colitis ([place])    Family history of lung cancer 10/02/2020   GERD (gastroesophageal reflux disease)    Hypertension    Pre-diabetes     SURGICAL HISTORY: Past Surgical History:  Procedure Laterality Date   BREAST SURGERY     BUNIONECTOMY     FLEXIBLE SIGMOIDOSCOPY N/A 07/10/2021   Procedure: FLEXIBLE SIGMOIDOSCOPY;  Surgeon: Arta Silence, MD;  Location: [facility];  Service: Endoscopy;  Laterality: N/A;   IR IMAGING GUIDED PORT INSERTION  10/14/2020   MODIFIED MASTECTOMY Left 05/14/2021   Procedure: LEFT MODIFIED RADICAL MASTECTOMY;  Surgeon: Rolm Bookbinder, MD;  Location: [facility];  Service: General;  Laterality: Left;   MYOMECTOMY     PORT-A-CATH REMOVAL Right 09/14/2021   Procedure: REMOVAL PORT-A-CATH;  Surgeon: Rolm Bookbinder, MD;  Location: [facility];  Service: General;  Laterality: Right;   TONSILLECTOMY     as a child    SOCIAL HISTORY: Social History   Socioeconomic History   Marital status: Divorced    Spouse name: Not on file   Number of children: Not on file   Years of education: Not on file   Highest education level: Not on file  Occupational History   Not on file  Tobacco Use   Smoking status: Former    Packs/day: 0.25    Types: Cigarettes   Smokeless tobacco: Never  Vaping Use   Vaping Use: Never used  Substance and Sexual Activity   Alcohol use: No   Drug use: No   Sexual activity: Not Currently    Birth control/protection: Post-menopausal  Other Topics Concern   Not on file  Social History Narrative   Not on file   Social Determinants of Health   Financial Resource Strain: Not on file  Food Insecurity: Not on file  Transportation Needs: Not on file  Physical Activity: Not on file  Stress: Not on file  Social Connections: Not on file  Intimate Partner Violence: Not on file    FAMILY HISTORY: Family History   Problem Relation Age of Onset   Heart attack Father    Lung cancer Father        dx unknown age   Cancer Other        MGM's mother; unknown type; dx unknown age    Review of Systems  Constitutional:  Negative for appetite change, chills, fatigue, fever and unexpected weight change.  HENT:   Negative for hearing loss, lump/mass and trouble swallowing.   Eyes:  Negative for eye problems and icterus.  Respiratory:  Negative for chest tightness, cough and shortness of breath.   Cardiovascular:  Negative for chest pain, leg swelling and palpitations.  Gastrointestinal:  Negative for abdominal distention, abdominal pain, constipation, diarrhea, nausea and vomiting.  Endocrine: Negative for hot flashes.  Genitourinary:  Negative for difficulty urinating.   Musculoskeletal:  Negative for arthralgias.  Skin:  Negative for itching and rash.  Neurological:  Negative for dizziness, extremity weakness, headaches and numbness.  Hematological:  Negative for adenopathy. Does not bruise/bleed easily.  Psychiatric/Behavioral:  Negative for depression. The patient is not nervous/anxious.  PHYSICAL EXAMINATION  ECOG PERFORMANCE STATUS: 1 - Symptomatic but completely ambulatory  Vitals:   01/18/22 1445  BP: 132/79  Pulse: 91  Resp: 17  Temp: 98.2 F (36.8 C)  SpO2: 95%    Physical Exam Constitutional:      General: She is not in acute distress.    Appearance: Normal appearance. She is not toxic-appearing.  HENT:     Head: Normocephalic and atraumatic.  Eyes:     General: No scleral icterus. Cardiovascular:     Rate and Rhythm: Normal rate and regular rhythm.     Pulses: Normal pulses.     Heart sounds: Normal heart sounds.  Pulmonary:     Effort: Pulmonary effort is normal.     Breath sounds: Normal breath sounds.  Chest:     Comments: Left chest wall + TTP, no nodularity noted, right breast benign Abdominal:     General: Abdomen is flat. Bowel sounds are normal. There is  no distension.     Palpations: Abdomen is soft.     Tenderness: There is no abdominal tenderness.  Musculoskeletal:        General: No swelling.     Cervical back: Neck supple.  Lymphadenopathy:     Cervical: No cervical adenopathy.     Upper Body:     Right upper body: No axillary adenopathy.     Left upper body: Supraclavicular adenopathy (approx 1 cm supraclavicular lymphadenopathy) present. No axillary adenopathy.  Skin:    General: Skin is warm and dry.     Findings: No rash.  Neurological:     General: No focal deficit present.     Mental Status: She is alert.  Psychiatric:        Mood and Affect: Mood normal.        Behavior: Behavior normal.     LABORATORY DATA: None for this visit     ASSESSMENT and THERAPY PLAN:   Malignant neoplasm of upper-outer quadrant of left breast in female, estrogen receptor negative ([place]) Diedre is here for f/u of new onselt left chest wall pain, swelling, tenderness and ROM difficulty.  She has no signs of infection.  Since she has a palpable supraclavicular lymph node I have placed orders for CT neck and chest to rule out breast cancer recurrence.   Another consideration is that she is experiencing lymphedema.  Once her CT scans are completed and recurrence ruled out, I will refer her to PT for lymphedema.    For the discomfort I recommended she take aleve or tylenol.    We will see her back in 01/2022 for her routine f/u with Dr. Lindi Adie.      All questions were answered. The patient knows to call the clinic with any problems, questions or concerns. We can certainly see the patient much sooner if necessary.  Total encounter time:20 minutes*in face-to-face visit time, chart review, lab review, care coordination, order entry, and documentation of the encounter time.    Wilber Bihari, NP 01/18/22 3:10 PM Medical Oncology and Hematology [facility] [place], [place] [address] Tel. [phone]    Fax.  [phone]  *Total Encounter Time as defined by the Centers for Medicare and Medicaid Services includes, in addition to the face-to-face time of a patient visit (documented in the note above) non-face-to-face time: obtaining and reviewing outside history, ordering and reviewing medications, tests or procedures, care coordination (communications with other health care professionals or caregivers) and documentation in the medical record.

## 2022-01-25 ENCOUNTER — Emergency Department (HOSPITAL_COMMUNITY): Payer: Federal, State, Local not specified - PPO

## 2022-01-25 ENCOUNTER — Emergency Department (HOSPITAL_BASED_OUTPATIENT_CLINIC_OR_DEPARTMENT_OTHER)
Admit: 2022-01-25 | Discharge: 2022-01-25 | Disposition: A | Discharge status: Home / Self Care | Payer: Federal, State, Local not specified - PPO

## 2022-01-25 ENCOUNTER — Emergency Department (HOSPITAL_COMMUNITY)
Admission: EM | Admit: 2022-01-25 | Discharge: 2022-01-25 | Disposition: A | Discharge status: Home / Self Care | Payer: Federal, State, Local not specified - PPO | Source: Home / Self Care | Attending: Emergency Medicine | Admitting: Emergency Medicine

## 2022-01-25 ENCOUNTER — Encounter (HOSPITAL_COMMUNITY): Payer: Self-pay

## 2022-01-25 ENCOUNTER — Other Ambulatory Visit: Payer: Self-pay

## 2022-01-25 DIAGNOSIS — Z20822 Contact with and (suspected) exposure to covid-19: Secondary | ICD-10-CM | POA: Insufficient documentation

## 2022-01-25 DIAGNOSIS — R7401 Elevation of levels of liver transaminase levels: Secondary | ICD-10-CM | POA: Insufficient documentation

## 2022-01-25 DIAGNOSIS — I110 Hypertensive heart disease with heart failure: Secondary | ICD-10-CM | POA: Diagnosis not present

## 2022-01-25 DIAGNOSIS — M7989 Other specified soft tissue disorders: Secondary | ICD-10-CM | POA: Insufficient documentation

## 2022-01-25 DIAGNOSIS — R000 Tachycardia, unspecified: Secondary | ICD-10-CM | POA: Insufficient documentation

## 2022-01-25 DIAGNOSIS — R570 Cardiogenic shock: Secondary | ICD-10-CM | POA: Diagnosis not present

## 2022-01-25 DIAGNOSIS — R7989 Other specified abnormal findings of blood chemistry: Secondary | ICD-10-CM | POA: Insufficient documentation

## 2022-01-25 DIAGNOSIS — R0789 Other chest pain: Secondary | ICD-10-CM | POA: Insufficient documentation

## 2022-01-25 LAB — COMPREHENSIVE METABOLIC PANEL
ALT: 90 U/L — ABNORMAL HIGH (ref 0–44)
AST: 99 U/L — ABNORMAL HIGH (ref 15–41)
Albumin: 3.8 g/dL (ref 3.5–5.0)
Alkaline Phosphatase: 103 U/L (ref 38–126)
Anion gap: 12 (ref 5–15)
BUN: 23 mg/dL — ABNORMAL HIGH (ref 6–20)
CO2: 21 mmol/L — ABNORMAL LOW (ref 22–32)
Calcium: 9.2 mg/dL (ref 8.9–10.3)
Chloride: 107 mmol/L (ref 98–111)
Creatinine, Ser: 1.13 mg/dL — ABNORMAL HIGH (ref 0.44–1.00)
GFR, Estimated: 56 mL/min — ABNORMAL LOW (ref >60)
Glucose, Bld: 109 mg/dL — ABNORMAL HIGH (ref 70–99)
Potassium: 3.9 mmol/L (ref 3.5–5.1)
Sodium: 140 mmol/L (ref 135–145)
Total Bilirubin: 0.8 mg/dL (ref 0.3–1.2)
Total Protein: 7.5 g/dL (ref 6.5–8.1)

## 2022-01-25 LAB — CBC
HCT: 41.4 % (ref 36.0–46.0)
Hemoglobin: 13.6 g/dL (ref 12.0–15.0)
MCH: 29.2 pg (ref 26.0–34.0)
MCHC: 32.9 g/dL (ref 30.0–36.0)
MCV: 89 fL (ref 80.0–100.0)
Platelets: 117 10*3/uL — ABNORMAL LOW (ref 150–400)
RBC: 4.65 MIL/uL (ref 3.87–5.11)
RDW: 16.2 % — ABNORMAL HIGH (ref 11.5–15.5)
WBC: 8.5 10*3/uL (ref 4.0–10.5)
nRBC: 0 % (ref 0.0–0.2)

## 2022-01-25 LAB — RESP PANEL BY RT-PCR (FLU A&B, COVID) ARPGX2
Influenza A by PCR: NEGATIVE
Influenza B by PCR: NEGATIVE
SARS Coronavirus 2 by RT PCR: NEGATIVE

## 2022-01-25 LAB — BLOOD GAS, VENOUS
Acid-Base Excess: 0.8 mmol/L (ref 0.0–2.0)
Bicarbonate: 24.2 mmol/L (ref 20.0–28.0)
O2 Saturation: 79.8 %
Patient temperature: 37
pCO2, Ven: 34 mmHg — ABNORMAL LOW (ref 44–60)
pH, Ven: 7.46 — ABNORMAL HIGH (ref 7.25–7.43)
pO2, Ven: 50 mmHg — ABNORMAL HIGH (ref 32–45)

## 2022-01-25 LAB — TROPONIN I (HIGH SENSITIVITY)
Troponin I (High Sensitivity): 353 ng/L (ref <18)
Troponin I (High Sensitivity): 359 ng/L (ref <18)

## 2022-01-25 MED ORDER — SODIUM CHLORIDE (PF) 0.9 % IJ SOLN
INTRAMUSCULAR | Status: AC
  Filled 2022-01-25: qty 50

## 2022-01-25 MED ORDER — IOHEXOL 350 MG/ML SOLN
100.0000 mL | Freq: Once | INTRAVENOUS | Status: AC | PRN
  Administered 2022-01-25: 100 mL via INTRAVENOUS

## 2022-01-25 NOTE — ED Triage Notes (Signed)
Patient c/o SOB and weakness since last week prior to colonoscopy.

## 2022-01-25 NOTE — ED Notes (Signed)
Save blue in main lab 

## 2022-01-25 NOTE — ED Provider Triage Note (Signed)
Emergency Medicine Provider Triage Evaluation Note  Rita Cole , a 61 y.o. female  was evaluated in triage.  Pt complains of sob. Developed LUE swelling  1 week ago. HX left mastedctomy. No having sob at rest, low 02 levels, fatigue. Tachycardia and ex3rtional dyspnea  Review of Systems  Positive: Arm swelling Negative:  fever  Physical Exam  BP 111/78 (BP Location: Right Arm)   Pulse (!) 110   Temp 98 F (36.7 C) (Oral)   Resp (!) 24   Ht '5\' 5"'$  (1.651 m)   Wt 90.7 kg   SpO2 97%   BMI 33.28 kg/m  Gen:   Awake, no distress   Resp:  Normal effort  MSK:   Moves extremities without difficulty  Other:  LUE swelling.  Medical Decision Making  Medically screening exam initiated at 9:44 AM.  Appropriate orders placed.  Rita Cole was informed that the remainder of the evaluation will be completed by another provider, this initial triage assessment does not replace that evaluation, and the importance of remaining in the ED until their evaluation is complete.   Work up initiated   Margarita Mail, PA-C 01/25/22 [phone]

## 2022-01-25 NOTE — Progress Notes (Signed)
Left upper extremity venous duplex has been completed. Preliminary results can be found in CV Proc through chart review.  Results were given to Margarita Mail PA.  01/25/22 11:13 AM Carlos Levering RVT

## 2022-01-25 NOTE — Discharge Instructions (Signed)
The testing today shows that you do not have a blood clot in your lungs or a pneumonia, to explain your trouble breathing and chest discomfort.  There appears to be changes from radiation in your upper chest wall on the left.  There is some adenopathy present in the chest that may be from lung cancer.  There was also a sclerotic lesion of T4 which is nonspecific.  These will need to be followed up on by Dr. Lindi Adie when you see him on February 08, 2022.

## 2022-01-25 NOTE — ED Provider Notes (Signed)
[facility] DEPT Provider Note   CSN: [id number] Arrival date & time: 01/25/22  0919     History  Chief Complaint  Patient presents with   Shortness of Breath   Weakness    Rita Cole is a 61 y.o. female.  HPI  She has a history of breast cancer.  Last seen by oncology, 01/18/2022.  She has had chemotherapy and surgery.  Patient complains of a sensation of discomfort in her left upper chest which has been present for nearly a week, and she relates this pain as being from her prior lymph node dissection of her axilla after having breast cancer.  Today she is high to come to get checked because she was having shortness of breath and was worried about swelling of her left arm.  She denies prior cardiac history.  She is not having any cough, fever, chills, nausea or vomiting.  Home Medications Prior to Admission medications   Medication Sig Start Date End Date Taking? Authorizing Provider  acetaminophen (TYLENOL) 500 MG tablet Take 500-1,000 mg by mouth every 6 (six) hours as needed for mild pain, headache or fever.   Yes [provider]  acetaminophen (TYLENOL) 650 MG CR tablet Take 1,300 mg by mouth 2 (two) times daily as needed for pain (arthritis).   Yes [provider]  amLODipine (NORVASC) 10 MG tablet Take 1 tablet (10 mg total) by mouth daily. 03/31/21  Yes Nicholas Lose, MD  CALCIUM PO Take 1 tablet by mouth every morning.   Yes [provider]  Cyanocobalamin (VITAMIN B-12 PO) Take 1 tablet by mouth every morning.   Yes [provider]  ferrous sulfate 325 (65 FE) MG tablet Take 325 mg by mouth every morning.   Yes [provider]  fluticasone (FLONASE) 50 MCG/ACT nasal spray Place 1 spray into both nostrils daily as needed for allergies or rhinitis.   Yes [provider]  loratadine (CLARITIN) 10 MG tablet Take 10 mg by mouth daily as needed for allergies.   Yes [provider]  losartan (COZAAR) 100 MG tablet Take 100 mg by mouth every morning. 02/26/21  Yes [provider]  MAGNESIUM PO Take 1 tablet by mouth every morning.   Yes [provider]  metoprolol succinate (TOPROL-XL) 50 MG 24 hr tablet Take 25 mg by mouth every morning. 02/26/21  Yes [provider]  POTASSIUM PO Take 1 tablet by mouth every morning.   Yes [provider]  Vitamin D, Ergocalciferol, (DRISDOL) 1.25 MG (50000 UNIT) CAPS capsule Take 50,000 Units by mouth every Tuesday. 08/19/20  Yes [provider]  budesonide (ENTOCORT EC) 3 MG 24 hr capsule Take 9 mg by mouth daily. Patient not taking: Reported on 01/25/2022    [provider]  Potassium Chloride ER 20 MEQ TBCR Take 1 tablet by mouth daily. Patient not taking: Reported on 01/25/2022 01/13/22   [provider]  cetirizine (ZYRTEC ALLERGY) 10 MG tablet Take 1 tablet (10 mg total) by mouth daily. Patient not taking: Reported on 09/06/2020 05/29/17 09/06/20  Jaynee Eagles, PA-C  prochlorperazine (COMPAZINE) 10 MG tablet Take 1 tablet (10 mg total) by mouth every 6 (six) hours as needed (Nausea or vomiting). 10/01/20 05/27/21  Nicholas Lose, MD      Allergies    Patient has no known allergies.    Review of Systems   Review of Systems  Physical Exam Updated Vital Signs BP (!) 169/100   Pulse Marland Kitchen)  108   Temp 98.9 F (37.2 C) (Oral)   Resp (!) 26   Ht '5\' 5"'$  (1.651 m)   Wt 90.7 kg   SpO2 100%   BMI 33.28 kg/m  Physical Exam Vitals and nursing note reviewed.  Constitutional:      General: She is not in acute distress.    Appearance: She is well-developed. She is obese. She is not ill-appearing or diaphoretic.  HENT:     Head: Normocephalic and atraumatic.     Right Ear: External ear normal.     Left Ear: External ear normal.     Nose: No congestion or rhinorrhea.  Eyes:     Conjunctiva/sclera: Conjunctivae normal.     Pupils: Pupils are equal, round, and reactive to light.   Neck:     Trachea: Phonation normal.  Cardiovascular:     Rate and Rhythm: Normal rate and regular rhythm.     Heart sounds: Normal heart sounds.  Pulmonary:     Effort: Pulmonary effort is normal. No respiratory distress.     Breath sounds: Normal breath sounds. No stridor. No rhonchi.  Chest:     Chest wall: Tenderness (Left upper chest wall, moderate without deformity or crepitation.) present.  Abdominal:     Palpations: Abdomen is soft.     Tenderness: There is no abdominal tenderness.  Musculoskeletal:        General: No swelling or tenderness. Normal range of motion.     Cervical back: Normal range of motion and neck supple.     Right lower leg: No edema.     Left lower leg: No edema.  Skin:    General: Skin is warm.  Neurological:     Mental Status: She is alert and oriented to person, place, and time.     Cranial Nerves: No cranial nerve deficit.     Sensory: No sensory deficit.     Motor: No abnormal muscle tone.     Coordination: Coordination normal.  Psychiatric:        Mood and Affect: Mood normal.        Behavior: Behavior normal.        Thought Content: Thought content normal.        Judgment: Judgment normal.     ED Results / Procedures / Treatments   Labs (all labs ordered are listed, but only abnormal results are displayed) Labs Reviewed  COMPREHENSIVE METABOLIC PANEL - Abnormal; Notable for the following components:      Result Value   CO2 21 (*)    Glucose, Bld 109 (*)    BUN 23 (*)    Creatinine, Ser 1.13 (*)    AST 99 (*)    ALT 90 (*)    GFR, Estimated 56 (*)    All other components within normal limits  CBC - Abnormal; Notable for the following components:   RDW 16.2 (*)    Platelets 117 (*)    All other components within normal limits  BLOOD GAS, VENOUS - Abnormal; Notable for the following components:   pH, Ven 7.46 (*)    pCO2, Ven 34 (*)    pO2, Ven 50 (*)    All other components within normal limits  TROPONIN I (HIGH SENSITIVITY)  - Abnormal; Notable for the following components:   Troponin I (High Sensitivity) 353 (*)    All other components within normal limits  TROPONIN I (HIGH SENSITIVITY) - Abnormal; Notable for the following components:   Troponin I (High  Sensitivity) 359 (*)    All other components within normal limits  RESP PANEL BY RT-PCR (FLU A&B, COVID) [id number]    EKG EKG Interpretation  Date/Time:  Monday January 25 2022 09:27:19 EDT Ventricular Rate:  114 PR Interval:  143 QRS Duration: 85 QT Interval:  325 QTC Calculation: 448 R Axis:   108 Text Interpretation: Sinus tachycardia Right axis deviation Probable anteroseptal infarct, old Borderline T abnormalities, inferior leads Baseline wander in lead(s) I II aVR aVF since last tracing no significant change Reconfirmed by Daleen Bo [phone]) on 01/25/2022 11:38:27 AM  Radiology CT Angio Chest PE W and/or Wo Contrast  Result Date: 01/25/2022 CLINICAL DATA:  Chest pain or SOB, pleurisy or effusion suspected history of left breast cancer. EXAM: CT ANGIOGRAPHY CHEST WITH CONTRAST TECHNIQUE: Multidetector CT imaging of the chest was performed using the standard protocol during bolus administration of intravenous contrast. Multiplanar CT image reconstructions and MIPs were obtained to evaluate the vascular anatomy. RADIATION DOSE REDUCTION: This exam was performed according to the departmental dose-optimization program which includes automated exposure control, adjustment of the mA and/or kV according to patient size and/or use of iterative reconstruction technique. CONTRAST:  161m OMNIPAQUE IOHEXOL 350 MG/ML SOLN COMPARISON:  Chest CT 10/07/2020 FINDINGS: Cardiovascular: Satisfactory opacification of the pulmonary arteries to the segmental level. No evidence of pulmonary embolism. Enlarged right atrium and right ventricle, new since the prior exam in April 2022.Trace pericardial fluid. The thoracic aorta is unremarkable. Mediastinum/Nodes: Prior left axillary  lymph node dissection. There are multiple enlarged mediastinal, hilar, lower cervical, and right axillary lymph nodes, new from prior exam. For reference, enlarged right axillary lymph node measures 1.8 cm (series 4, image 49). Enlarged right paratracheal lymph node measures 2.3 cm short axis (series 4, image 42). The thyroid is unremarkable. Esophagus is unremarkable. Lungs/Pleura: There is a left upper lobe consolidation, peripherally located with associated bronchiolectasis. There is a degree of volume loss in the left hemithorax. No pleural effusion. No pneumothorax. Upper Abdomen: No acute abnormality. Musculoskeletal: Postsurgical changes of left mastectomy. There is skin thickening and subcutaneous soft tissue swelling. There is a new sclerotic lesion within the T4 vertebral body which may involve the right posterior elements (series 7 image 127, series 10 image 32). Review of the MIP images confirms the above findings. IMPRESSION: Postsurgical changes of left mastectomy, with skin thickening and subcutaneous soft tissue swelling which may be related to residual disease or post radiation change. New mediastinal, hilar, lower cervical, and right axillary lymphadenopathy, and new sclerotic lesion of T4, concerning for disease recurrence/metastases. Prominent lower cervical lymph nodes as well. Left upper lobe peripheral consolidation, which could be pneumonia or sequela of evolving post radiation change. No evidence of pulmonary embolism. New right atrial and right ventricular enlargement, suggesting pulmonary hypertension. Recommend correlation with echocardiogram. New trace pericardial effusion. Electronically Signed   By: JMaurine SimmeringM.D.   On: 01/25/2022 13:16   UE VENOUS DUPLEX (7am - 7pm)  Result Date: 01/25/2022 UPPER VENOUS STUDY  [facility] Date of [facility]am:   01/25/2022 Medical Rec #: [id number]              Accession #:    [id number] of Birth: 709-06-1960               Patient Gender: F Patient Age:   89years [facility]      VAS [place] EXTREM[place] VENOUS DUPLEX Referring Phys:  ABIGAIL HARRIS --------------------------------------------------------------------------------  Indications: Swelling Risk Factors: Cancer. Comparison Study: No prior studies. Performing Technologist: Oliver Hum RVT  Examination Guidelines: A complete evaluation includes B-mode imaging, spectral Doppler, color Doppler, and power Doppler as needed of all accessible portions of each vessel. Bilateral testing is considered an integral part of a complete examination. Limited examinations for reoccurring indications may be performed as noted.  Right Findings: +----------+------------+---------+-----------+----------+-------+ RIGHT     CompressiblePhasicitySpontaneousPropertiesSummary +----------+------------+---------+-----------+----------+-------+ Subclavian    Full       Yes       Yes                      +----------+------------+---------+-----------+----------+-------+  Left Findings: +----------+------------+---------+-----------+----------+-------+ LEFT      CompressiblePhasicitySpontaneousPropertiesSummary +----------+------------+---------+-----------+----------+-------+ IJV           Full       Yes       Yes                      +----------+------------+---------+-----------+----------+-------+ Subclavian    Full       Yes       Yes                      +----------+------------+---------+-----------+----------+-------+ Axillary      Full       Yes       Yes                      +----------+------------+---------+-----------+----------+-------+ Brachial      Full       Yes       Yes                      +----------+------------+---------+-----------+----------+-------+ Radial        Full                                          +----------+------------+---------+-----------+----------+-------+ Ulnar          Full                                          +----------+------------+---------+-----------+----------+-------+ Cephalic      Full                                          +----------+------------+---------+-----------+----------+-------+ Basilic       Full                                          +----------+------------+---------+-----------+----------+-------+  Summary:  Right: No evidence of thrombosis in the subclavian.  Left: No evidence of deep vein thrombosis in the upper extremity. No evidence of superficial vein thrombosis in the upper extremity.  *See table(s) above for measurements and observations.     Preliminary     Procedures Procedures    Medications Ordered in ED Medications  iohexol (OMNIPAQUE) 350 MG/ML injection 100 mL (100 mLs Intravenous Contrast Given 01/25/22 1247)  sodium chloride (PF) 0.9 % injection (  Given 01/25/22 1318)    ED Course/ Medical  Decision Making/ A&P Clinical Course as of 01/25/22 1436  Mon Jan 25, 2022  1027 Resp Panel by RT-PCR (Flu A&B, Covid) Anterior Nasal Swab [AH]    Clinical Course User Index [AH] Margarita Mail, PA-C                           Medical Decision Making Patient presenting with chest wall pain and left arm swelling.  She is being treated for breast cancer with infusions.  Seen recently by her oncology provider.  They plan to do an outpatient CT, tomorrow.  Amount and/or Complexity of Data Reviewed Independent Historian:     Details: She is a cogent historian External Data Reviewed: notes.    Details: Oncology evaluation 01/18/2022-she is status post chemotherapy and surgery for left breast cancer.  She is complaining of swelling of left arm, and left chest pain at that time.  Outpatient CT scan was ordered, and pending for tomorrow. Labs: ordered.    Details: CBC, metabolic panel, troponin -- normal except stable elevated troponin, CO2 low, glucose high, BUN high, creatinine high, AST high, ALT high,  GFR low Radiology: ordered and independent interpretation performed.    Details: CT angiogram chest -- no PE, consolidation left upper lobe nonspecific, pneumonia versus radiation changes.  T4 sclerotic lesion present.  Lymphadenopathy present. Discussion of management or test interpretation with external provider(s): Consultation oncology - I discussed the case with Dr. Lindi Adie, her oncologist who plans on seeing her on 02/08/2022 as scheduled.  He does not recommend any further interventions at this time  Risk Prescription drug management. Decision regarding hospitalization. Risk Details: Patient presenting with chest wall pain, and left arm swelling.  Evaluation in the ED to rule out PE was remarkable for: No PE, left upper lobe consolidation differential diagnosis pneumonia versus radiation changes, increased adenopathy as compared to prior and T4 sclerotic lesion.  Possible progressive breast cancer with metastases.  Doubt pneumonia.  Doubt ACS.  Stable elevation high-sensitivity troponin likely not indicating ACS.  Patient is comfortable does not have ongoing shortness of breath at rest, severe chest pain, clinical signs of pneumonia or hemodynamic instability.  Plan outpatient follow-up with oncology as scheduled on 01/29/2022.  Recommend symptomatic treatment until that time.           Final Clinical Impression(s) / ED Diagnoses Final diagnoses:  Chest wall pain  Left arm swelling    Rx / DC Orders ED Discharge Orders     None         Daleen Bo, MD 01/25/22 1436

## 2022-01-26 ENCOUNTER — Other Ambulatory Visit: Payer: Self-pay | Admitting: *Deleted

## 2022-01-26 ENCOUNTER — Inpatient Hospital Stay
Payer: Federal, State, Local not specified - PPO | Attending: Hematology and Oncology | Admitting: Hematology and Oncology

## 2022-01-26 ENCOUNTER — Ambulatory Visit (HOSPITAL_COMMUNITY): Payer: Federal, State, Local not specified - PPO

## 2022-01-26 VITALS — BP 111/65 | HR 97 | Temp 97.7°F | Resp 19 | Ht 65.0 in | Wt 203.9 lb

## 2022-01-26 DIAGNOSIS — C50412 Malignant neoplasm of upper-outer quadrant of left female breast: Secondary | ICD-10-CM

## 2022-01-26 DIAGNOSIS — I272 Pulmonary hypertension, unspecified: Secondary | ICD-10-CM

## 2022-01-26 DIAGNOSIS — Z171 Estrogen receptor negative status [ER-]: Secondary | ICD-10-CM

## 2022-01-26 MED ORDER — GABAPENTIN 300 MG PO CAPS: 300.0000 mg | ORAL_CAPSULE | Freq: Every day | ORAL | 3 refills | Status: AC

## 2022-01-26 NOTE — Progress Notes (Signed)
Pt O2 87% RA and dropped to 85% with slight ambulation, alleviated with 2L [place].  Per MD orders placed for home O2 through parachute with expected delivery date today 01/26/22.

## 2022-01-26 NOTE — Progress Notes (Signed)
Patient Care Team: Riki Sheer, NP as PCP - General (Nurse Practitioner) Rolm Bookbinder, MD as Consulting Physician (General Surgery) Nicholas Lose, MD as Consulting Physician (Hematology and Oncology) Kyung Rudd, MD as Consulting Physician (Radiation Oncology)  DIAGNOSIS:  Encounter Diagnosis  Name Primary?   Malignant neoplasm of upper-outer quadrant of left breast in female, estrogen receptor negative ([place]) Yes    SUMMARY OF ONCOLOGIC HISTORY: Oncology History  Malignant neoplasm of upper-outer quadrant of left breast in female, estrogen receptor negative ([place])  09/30/2020 Initial Diagnosis   Swollen left breast: Mammogram detected left breast mass UOQ skin thickening with 3 abnormal lymph nodes that are matted and hard, ultrasound 2.7 cm, 2.9 cm, 3 cm and 3 lymph nodes biopsy revealed grade 2-3 IDC with DCIS ER/PR negative HER-2 IHC equivocal FISH pending, Ki-67 60%, lymph node biopsy positive   10/01/2020 Cancer Staging   Staging form: Breast, AJCC 8th Edition - Clinical stage from 10/01/2020: Stage IIIC (cT4b, cN2a, cM0, G3, ER-, PR-, HER2: Equivocal) - Signed by Nicholas Lose, MD on 10/01/2020 Stage prefix: Initial diagnosis Histologic grading system: 3 grade system   10/13/2020 Genetic Testing   Negative hereditary cancer genetic testing: no pathogenic variants detected in [facility] CustomNext-Cancer +RNAinsight Panel.  The report date is October 13, 2020.   The CustomNext-Cancer+RNAinsight panel offered by [facility] includes sequencing and rearrangement analysis for the following 47 genes:  APC, ATM, AXIN2, BARD1, BMPR1A, BRCA1, BRCA2, BRIP1, CDH1, CDK4, CDKN2A, CHEK2, DICER1, EPCAM, GREM1, HOXB13, MEN1, MLH1, MSH2, MSH3, MSH6, MUTYH, NBN, NF1, NF2, NTHL1, PALB2, PMS2, POLD1, POLE, PTEN, RAD51C, RAD51D, RECQL, RET, SDHA, SDHAF2, SDHB, SDHC, SDHD, SMAD4, SMARCA4, STK11, TP53, TSC1, TSC2, and VHL.  RNA data is routinely analyzed for use in variant interpretation for all  genes.   01/13/2021 - 04/14/2021 Chemotherapy   Patient is on Treatment Plan : BREAST Pembrolizumab + AC q21d x 4 cycles / Pembrolizumab + Carboplatin D1 + Paclitaxel D1,8,15 q21d X 4 cycles     05/14/2021 Surgery   Left modified radical mastectomy: Metastatic carcinoma in 3/7 lymph nodes, no residual invasive cancer in the breast, no extranodal extension, ER 0%, PR 0%, HER2 negative, Ki-67 60%   05/14/2021 Cancer Staging   Staging form: Breast, AJCC 8th Edition - Pathologic stage from 05/14/2021: No Stage Recommended (ypT0, pN1a, cM0) - Signed by Gardenia Phlegm, NP on 06/05/2021 Stage prefix: Post-therapy   06/05/2021 - 06/26/2021 Chemotherapy   Patient is on Treatment Plan : BREAST Pembrolizumab q21d       CHIEF COMPLIANT: Review scans  INTERVAL HISTORY: Rita Cole is a 61 y.o female with the above-mentioned. She presents to the clinic today to review scans. Se states that when she walks or talk she is out of breath. Complains of headaches and shoulder pain, she couldn't sleep last night. On the scale of 1-10 her pain level is at a 3.  She was in superb health about 1 to 2 weeks ago.  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  Over the past couple of weeks she felt increasing chest discomfort and shortness of breath.  She went to the emergency room where they performed a CT angiogram that did not reveal any pulmonary embolism but it did show extensive lymphadenopathy in the axillary hilar mediastinal and cervical areas.  It also showed scar tissue in the lung from prior radiation.    ALLERGIES:  has No Known Allergies.  MEDICATIONS:  Current Outpatient Medications  Medication Sig Dispense Refill  gabapentin (NEURONTIN) 300 MG capsule Take 1 capsule (300 mg total) by mouth at bedtime. 30 capsule 3   acetaminophen (TYLENOL) 500 MG tablet Take 500-1,000 mg by mouth every 6 (six) hours as needed for mild pain, headache or fever.     acetaminophen (TYLENOL)  650 MG CR tablet Take 1,300 mg by mouth 2 (two) times daily as needed for pain (arthritis).     amLODipine (NORVASC) 10 MG tablet Take 1 tablet (10 mg total) by mouth daily.     budesonide (ENTOCORT EC) 3 MG 24 hr capsule Take 9 mg by mouth daily. (Patient not taking: Reported on 01/25/2022)     CALCIUM PO Take 1 tablet by mouth every morning.     Cyanocobalamin (VITAMIN B-12 PO) Take 1 tablet by mouth every morning.     ferrous sulfate 325 (65 FE) MG tablet Take 325 mg by mouth every morning.     fluticasone (FLONASE) 50 MCG/ACT nasal spray Place 1 spray into both nostrils daily as needed for allergies or rhinitis.     loratadine (CLARITIN) 10 MG tablet Take 10 mg by mouth daily as needed for allergies.     losartan (COZAAR) 100 MG tablet Take 100 mg by mouth every morning.     MAGNESIUM PO Take 1 tablet by mouth every morning.     metoprolol succinate (TOPROL-XL) 50 MG 24 hr tablet Take 25 mg by mouth every morning.     Potassium Chloride ER 20 MEQ TBCR Take 1 tablet by mouth daily. (Patient not taking: Reported on 01/25/2022)     POTASSIUM PO Take 1 tablet by mouth every morning.     Vitamin D, Ergocalciferol, (DRISDOL) 1.25 MG (50000 UNIT) CAPS capsule Take 50,000 Units by mouth every Tuesday.     No current facility-administered medications for this visit.    PHYSICAL EXAMINATION: ECOG PERFORMANCE STATUS: 1 - Symptomatic but completely ambulatory  Vitals:   01/26/22 1442 01/26/22 1443  BP: 93/65 111/65  Pulse: 97   Resp: 19   Temp: 97.7 F (36.5 C)   SpO2: (!) 88% (!) 89%   Filed Weights   01/26/22 1442  Weight: 203 lb 14.4 oz (92.5 kg)      LABORATORY DATA:  I have reviewed the data as listed    Latest Ref Rng & Units 01/25/2022   10:24 AM 07/17/2021    8:16 AM 07/13/2021    5:14 AM  CMP  Glucose 70 - 99 mg/dL 109  121  133   BUN 6 - 20 mg/dL 23  33  20   Creatinine 0.44 - 1.00 mg/dL 1.13  1.13  0.75   Sodium 135 - 145 mmol/L 140  139  139   Potassium 3.5 - 5.1 mmol/L  3.9  3.8  3.4   Chloride 98 - 111 mmol/L 107  100  104   CO2 22 - 32 mmol/L 21  32  28   Calcium 8.9 - 10.3 mg/dL 9.2  9.3  9.0   Total Protein 6.5 - 8.1 g/dL 7.5  6.5  5.9   Total Bilirubin 0.3 - 1.2 mg/dL 0.8  0.3  0.3   Alkaline Phos 38 - 126 U/L 103  42  38   AST 15 - 41 U/L 99  19  24   ALT 0 - 44 U/L 90  37  24     Lab Results  Component Value Date   WBC 8.5 01/25/2022   HGB 13.6 01/25/2022   HCT 41.4  01/25/2022   MCV 89.0 01/25/2022   PLT 117 (L) 01/25/2022   NEUTROABS 11.4 (H) 07/17/2021    ASSESSMENT & PLAN:  Malignant neoplasm of upper-outer quadrant of left breast in female, estrogen receptor negative ([place]) 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 Neoadjuvant chemotherapy with Adriamycin and Cytoxan dose dense 4 followed by Taxol weekly 12 with carboplatin every 3 weeks x4 (pembrolizumab will be added if she is triple negative) 2. 05/14/2021:Left modified radical mastectomy: Metastatic carcinoma in 3/7 lymph nodes, no residual invasive cancer in the breast, no extranodal extension, ER 0%, PR 0%, HER2 negative, Ki-67 60% 3. Followed by adjuvant radiation therapy started 06/24/2021 PET CT scan: Known breast cancer and lymphadenopathy in the axilla, supraclavicular, pectoral, internal mammary lymph nodes, no distant metastatic disease -------------------------------------------------------------------------- Hospitalization: 07/06/21-07/13/21 Sepsis with colitis Beryle Flock has been discontinued) Crohns disease: Stable 01/25/2022: Chest pain: Emergency room visit: CT angiogram chest: Postsurgical changes.  New mediastinal, hilar, lower cervical and right axillary lymphadenopathy  and new sclerotic T4 lesion, new right atrial and right ventricular enlargement suggesting pulmonary hypertension  Plan: 1.  Shortness of breath: Possibly due to pulmonary hypertension.  I will order an echocardiogram and will refer her to see Dr. Haroldine Laws with cardiology.  Home oxygen has been ordered 2. T4 vertebral body lesion: We will obtain a bone scan to assess the bones.  If this is a solitary lesion then we will obtain a biopsy of the bone. 3.  Multiple lymph nodes: Right axillary, cervical, hilar, mediastinal.  We will request for ultrasound-guided lymph node biopsy.  Severe fatigue and wheelchair-bound: Unclear etiology.  Return to clinic in 1 week to assess the tests and results.    Orders Placed This Encounter  Procedures   NM Bone Scan Whole Body    Standing Status:   Future    Standing Expiration Date:   01/26/2023    Order Specific Question:   If indicated for the ordered procedure, I authorize the administration of a radiopharmaceutical per Radiology protocol    Answer:   Yes    Order Specific Question:   Is the patient pregnant?    Answer:   No    Order Specific Question:   Preferred imaging location?    Answer:   [facility]   ECHOCARDIOGRAM COMPLETE    Standing Status:   Future    Standing Expiration Date:   01/27/2023    Order Specific Question:   Where should this test be performed    Answer:   [place]    Order Specific Question:   Perflutren DEFINITY (image enhancing agent) should be administered unless hypersensitivity or allergy exist    Answer:   Administer Perflutren    Order Specific Question:   Reason for exam-Echo    Answer:   Chemo  Z09   The patient has a good understanding of the overall plan. she agrees with it. she will call with any problems that may develop before the next visit here. Total time spent: 30 mins including face to face time and time spent for planning, charting and co-ordination of care   Harriette Ohara,  MD 01/26/22    I Gardiner Coins am scribing  for Dr. Lindi Adie  I have reviewed the above documentation for accuracy and completeness, and I agree with the above.

## 2022-01-26 NOTE — Assessment & Plan Note (Addendum)
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 Neoadjuvant chemotherapy with Adriamycin and Cytoxan dose dense 4 followed by Taxol weekly 12 with carboplatin every 3 weeks x4(pembrolizumab will be added if she is triple negative) 2. 05/14/2021:Left modified radical mastectomy: Metastatic carcinoma in 3/7 lymph nodes, no residual invasive cancer in the breast, no extranodal extension, ER 0%, PR 0%, HER2 negative, Ki-67 60% 3. Followed by adjuvant radiation therapy started 06/24/2021 PET CT scan: Known breast cancer and lymphadenopathy in the axilla, supraclavicular, pectoral, internal mammary lymph nodes, no distant metastatic disease -------------------------------------------------------------------------- Hospitalization: 07/06/21-07/13/21 Sepsis with colitis Beryle Flock has been discontinued) Crohns disease: Stable 01/25/2022: Chest pain: Emergency room visit: CT angiogram chest: Postsurgical changes.  New mediastinal, hilar, lower cervical and right axillary lymphadenopathy and new sclerotic T4 lesion, new right atrial and right ventricular enlargement suggesting pulmonary hypertension  Plan: 1.  Shortness of breath: Possibly due to pulmonary hypertension.  I will order an echocardiogram and will refer her to see Dr. Haroldine Laws with cardiology.  Home oxygen has been ordered 2. T4 vertebral body lesion: We will obtain a bone scan to assess the bones.  If this is a solitary lesion then we will obtain a biopsy of the bone. 3.  Multiple lymph nodes: Right axillary, cervical, hilar, mediastinal.  We will request for ultrasound-guided lymph node biopsy.  Severe fatigue and wheelchair-bound: Unclear  etiology.  Return to clinic in 1 week to assess the tests and results.

## 2022-01-26 NOTE — Progress Notes (Signed)
Per MD request, referral to Dr. Haroldine Laws placed for evaluation of pulmonary hypertension.

## 2022-01-27 ENCOUNTER — Inpatient Hospital Stay (HOSPITAL_COMMUNITY): Admission: EM | Disposition: E | Payer: Self-pay | Source: Home / Self Care | Attending: Internal Medicine

## 2022-01-27 ENCOUNTER — Other Ambulatory Visit: Payer: Self-pay | Admitting: *Deleted

## 2022-01-27 ENCOUNTER — Ambulatory Visit (HOSPITAL_COMMUNITY)
Admission: RE | Admit: 2022-01-27 | Discharge: 2022-01-27 | Disposition: A | Discharge status: Home / Self Care | Payer: Federal, State, Local not specified - PPO | Source: Ambulatory Visit | Attending: Adult Health | Admitting: Adult Health

## 2022-01-27 ENCOUNTER — Emergency Department (HOSPITAL_COMMUNITY): Payer: Federal, State, Local not specified - PPO

## 2022-01-27 ENCOUNTER — Other Ambulatory Visit: Payer: Self-pay

## 2022-01-27 ENCOUNTER — Inpatient Hospital Stay: Payer: Federal, State, Local not specified - PPO

## 2022-01-27 ENCOUNTER — Ambulatory Visit (HOSPITAL_COMMUNITY): Admission: RE | Admit: 2022-01-27 | Payer: Federal, State, Local not specified - PPO | Source: Ambulatory Visit

## 2022-01-27 ENCOUNTER — Encounter (HOSPITAL_COMMUNITY): Payer: Self-pay

## 2022-01-27 ENCOUNTER — Inpatient Hospital Stay (HOSPITAL_COMMUNITY)
Admission: EM | Admit: 2022-01-27 | Discharge: 2022-02-26 | DRG: 286 | Disposition: E | Payer: Federal, State, Local not specified - PPO | Attending: Cardiology | Admitting: Cardiology

## 2022-01-27 ENCOUNTER — Inpatient Hospital Stay (HOSPITAL_COMMUNITY): Payer: Federal, State, Local not specified - PPO

## 2022-01-27 ENCOUNTER — Other Ambulatory Visit: Payer: Self-pay | Admitting: Hematology and Oncology

## 2022-01-27 ENCOUNTER — Encounter (HOSPITAL_COMMUNITY)
Admission: RE | Admit: 2022-01-27 | Discharge: 2022-01-27 | Disposition: A | Discharge status: Home / Self Care | Payer: Federal, State, Local not specified - PPO | Source: Ambulatory Visit | Attending: Hematology and Oncology | Admitting: Hematology and Oncology

## 2022-01-27 ENCOUNTER — Inpatient Hospital Stay (HOSPITAL_BASED_OUTPATIENT_CLINIC_OR_DEPARTMENT_OTHER): Payer: Federal, State, Local not specified - PPO | Admitting: Physician Assistant

## 2022-01-27 VITALS — BP 96/64 | HR 86 | Temp 97.8°F | Resp 22

## 2022-01-27 DIAGNOSIS — Z801 Family history of malignant neoplasm of trachea, bronchus and lung: Secondary | ICD-10-CM

## 2022-01-27 DIAGNOSIS — I248 Other forms of acute ischemic heart disease: Secondary | ICD-10-CM | POA: Diagnosis present

## 2022-01-27 DIAGNOSIS — K7200 Acute and subacute hepatic failure without coma: Secondary | ICD-10-CM | POA: Diagnosis present

## 2022-01-27 DIAGNOSIS — G43909 Migraine, unspecified, not intractable, without status migrainosus: Secondary | ICD-10-CM | POA: Diagnosis not present

## 2022-01-27 DIAGNOSIS — C7951 Secondary malignant neoplasm of bone: Secondary | ICD-10-CM | POA: Diagnosis present

## 2022-01-27 DIAGNOSIS — I509 Heart failure, unspecified: Secondary | ICD-10-CM

## 2022-01-27 DIAGNOSIS — I110 Hypertensive heart disease with heart failure: Principal | ICD-10-CM | POA: Diagnosis present

## 2022-01-27 DIAGNOSIS — Z20822 Contact with and (suspected) exposure to covid-19: Secondary | ICD-10-CM | POA: Diagnosis present

## 2022-01-27 DIAGNOSIS — K529 Noninfective gastroenteritis and colitis, unspecified: Secondary | ICD-10-CM

## 2022-01-27 DIAGNOSIS — Z9012 Acquired absence of left breast and nipple: Secondary | ICD-10-CM

## 2022-01-27 DIAGNOSIS — I50810 Right heart failure, unspecified: Secondary | ICD-10-CM

## 2022-01-27 DIAGNOSIS — R609 Edema, unspecified: Secondary | ICD-10-CM | POA: Diagnosis not present

## 2022-01-27 DIAGNOSIS — E871 Hypo-osmolality and hyponatremia: Secondary | ICD-10-CM | POA: Diagnosis not present

## 2022-01-27 DIAGNOSIS — C50412 Malignant neoplasm of upper-outer quadrant of left female breast: Secondary | ICD-10-CM

## 2022-01-27 DIAGNOSIS — D6959 Other secondary thrombocytopenia: Secondary | ICD-10-CM | POA: Diagnosis present

## 2022-01-27 DIAGNOSIS — Z171 Estrogen receptor negative status [ER-]: Secondary | ICD-10-CM

## 2022-01-27 DIAGNOSIS — R570 Cardiogenic shock: Secondary | ICD-10-CM

## 2022-01-27 DIAGNOSIS — K219 Gastro-esophageal reflux disease without esophagitis: Secondary | ICD-10-CM | POA: Diagnosis present

## 2022-01-27 DIAGNOSIS — Z7952 Long term (current) use of systemic steroids: Secondary | ICD-10-CM

## 2022-01-27 DIAGNOSIS — M7989 Other specified soft tissue disorders: Secondary | ICD-10-CM | POA: Diagnosis present

## 2022-01-27 DIAGNOSIS — I2721 Secondary pulmonary arterial hypertension: Secondary | ICD-10-CM | POA: Diagnosis present

## 2022-01-27 DIAGNOSIS — R0602 Shortness of breath: Secondary | ICD-10-CM

## 2022-01-27 DIAGNOSIS — J9601 Acute respiratory failure with hypoxia: Secondary | ICD-10-CM | POA: Diagnosis present

## 2022-01-27 DIAGNOSIS — Z853 Personal history of malignant neoplasm of breast: Secondary | ICD-10-CM

## 2022-01-27 DIAGNOSIS — Z8249 Family history of ischemic heart disease and other diseases of the circulatory system: Secondary | ICD-10-CM

## 2022-01-27 DIAGNOSIS — R627 Adult failure to thrive: Secondary | ICD-10-CM | POA: Diagnosis present

## 2022-01-27 DIAGNOSIS — C50912 Malignant neoplasm of unspecified site of left female breast: Secondary | ICD-10-CM | POA: Diagnosis present

## 2022-01-27 DIAGNOSIS — K5010 Crohn's disease of large intestine without complications: Secondary | ICD-10-CM | POA: Diagnosis present

## 2022-01-27 DIAGNOSIS — N179 Acute kidney failure, unspecified: Secondary | ICD-10-CM | POA: Diagnosis present

## 2022-01-27 DIAGNOSIS — R7989 Other specified abnormal findings of blood chemistry: Secondary | ICD-10-CM | POA: Diagnosis not present

## 2022-01-27 DIAGNOSIS — Z66 Do not resuscitate: Secondary | ICD-10-CM | POA: Diagnosis not present

## 2022-01-27 DIAGNOSIS — R7303 Prediabetes: Secondary | ICD-10-CM | POA: Diagnosis present

## 2022-01-27 DIAGNOSIS — E669 Obesity, unspecified: Secondary | ICD-10-CM | POA: Diagnosis present

## 2022-01-27 DIAGNOSIS — Z79891 Long term (current) use of opiate analgesic: Secondary | ICD-10-CM

## 2022-01-27 DIAGNOSIS — Z923 Personal history of irradiation: Secondary | ICD-10-CM

## 2022-01-27 DIAGNOSIS — R591 Generalized enlarged lymph nodes: Secondary | ICD-10-CM | POA: Diagnosis present

## 2022-01-27 DIAGNOSIS — I50813 Acute on chronic right heart failure: Secondary | ICD-10-CM | POA: Diagnosis present

## 2022-01-27 DIAGNOSIS — Z6833 Body mass index (BMI) 33.0-33.9, adult: Secondary | ICD-10-CM

## 2022-01-27 DIAGNOSIS — Z79899 Other long term (current) drug therapy: Secondary | ICD-10-CM

## 2022-01-27 DIAGNOSIS — Z9221 Personal history of antineoplastic chemotherapy: Secondary | ICD-10-CM

## 2022-01-27 DIAGNOSIS — E872 Acidosis, unspecified: Secondary | ICD-10-CM | POA: Diagnosis present

## 2022-01-27 DIAGNOSIS — Z7189 Other specified counseling: Secondary | ICD-10-CM | POA: Diagnosis not present

## 2022-01-27 HISTORY — PX: RIGHT HEART CATH: CATH118263

## 2022-01-27 LAB — COOXEMETRY PANEL
Carboxyhemoglobin: 0.5 % (ref 0.5–1.5)
Carboxyhemoglobin: 0.5 % (ref 0.5–1.5)
Methemoglobin: <0.7 % (ref 0.0–1.5)
Methemoglobin: <0.7 % (ref 0.0–1.5)
O2 Saturation: 94.4 %
O2 Saturation: 46.5 %
Total hemoglobin: 13 g/dL (ref 12.0–16.0)
Total hemoglobin: 12.6 g/dL (ref 12.0–16.0)

## 2022-01-27 LAB — COMPREHENSIVE METABOLIC PANEL
ALT: 153 U/L — ABNORMAL HIGH (ref 0–44)
AST: 181 U/L — ABNORMAL HIGH (ref 15–41)
Albumin: 3.5 g/dL (ref 3.5–5.0)
Alkaline Phosphatase: 126 U/L (ref 38–126)
Anion gap: 16 — ABNORMAL HIGH (ref 5–15)
BUN: 59 mg/dL — ABNORMAL HIGH (ref 6–20)
CO2: 20 mmol/L — ABNORMAL LOW (ref 22–32)
Calcium: 9 mg/dL (ref 8.9–10.3)
Chloride: 103 mmol/L (ref 98–111)
Creatinine, Ser: 3.43 mg/dL — ABNORMAL HIGH (ref 0.44–1.00)
GFR, Estimated: 15 mL/min — ABNORMAL LOW (ref >60)
Glucose, Bld: 87 mg/dL (ref 70–99)
Potassium: 4.7 mmol/L (ref 3.5–5.1)
Sodium: 139 mmol/L (ref 135–145)
Total Bilirubin: 1 mg/dL (ref 0.3–1.2)
Total Protein: 6.8 g/dL (ref 6.5–8.1)

## 2022-01-27 LAB — URINALYSIS, ROUTINE W REFLEX MICROSCOPIC
Bilirubin Urine: NEGATIVE
Glucose, UA: NEGATIVE mg/dL
Hgb urine dipstick: NEGATIVE
Ketones, ur: NEGATIVE mg/dL
Leukocytes,Ua: NEGATIVE
Nitrite: NEGATIVE
Protein, ur: 100 mg/dL — AB
Specific Gravity, Urine: 1.031 — ABNORMAL HIGH (ref 1.005–1.030)
pH: 5 (ref 5.0–8.0)

## 2022-01-27 LAB — CBC WITH DIFFERENTIAL/PLATELET
Abs Immature Granulocytes: 0.06 10*3/uL (ref 0.00–0.07)
Basophils Absolute: 0 10*3/uL (ref 0.0–0.1)
Basophils Relative: 0 %
Eosinophils Absolute: 0 10*3/uL (ref 0.0–0.5)
Eosinophils Relative: 0 %
HCT: 39.7 % (ref 36.0–46.0)
Hemoglobin: 12.7 g/dL (ref 12.0–15.0)
Immature Granulocytes: 1 %
Lymphocytes Relative: 11 %
Lymphs Abs: 1.1 10*3/uL (ref 0.7–4.0)
MCH: 29 pg (ref 26.0–34.0)
MCHC: 32 g/dL (ref 30.0–36.0)
MCV: 90.6 fL (ref 80.0–100.0)
Monocytes Absolute: 0.7 10*3/uL (ref 0.1–1.0)
Monocytes Relative: 7 %
Neutro Abs: 7.7 10*3/uL (ref 1.7–7.7)
Neutrophils Relative %: 81 %
Platelets: 128 10*3/uL — ABNORMAL LOW (ref 150–400)
RBC: 4.38 MIL/uL (ref 3.87–5.11)
RDW: 16.6 % — ABNORMAL HIGH (ref 11.5–15.5)
WBC: 9.5 10*3/uL (ref 4.0–10.5)
nRBC: 0.3 % — ABNORMAL HIGH (ref 0.0–0.2)

## 2022-01-27 LAB — BLOOD GAS, VENOUS
Acid-base deficit: 5.9 mmol/L — ABNORMAL HIGH (ref 0.0–2.0)
Bicarbonate: 20.7 mmol/L (ref 20.0–28.0)
O2 Saturation: 18.4 %
Patient temperature: 37
pCO2, Ven: 44 mmHg (ref 44–60)
pH, Ven: 7.28 (ref 7.25–7.43)
pO2, Ven: <31 mmHg — CL (ref 32–45)

## 2022-01-27 LAB — POCT I-STAT 7, (LYTES, BLD GAS, ICA,H+H)
Acid-base deficit: 9 mmol/L — ABNORMAL HIGH (ref 0.0–2.0)
Bicarbonate: 16 mmol/L — ABNORMAL LOW (ref 20.0–28.0)
Calcium, Ion: 1.11 mmol/L — ABNORMAL LOW (ref 1.15–1.40)
HCT: 37 % (ref 36.0–46.0)
Hemoglobin: 12.6 g/dL (ref 12.0–15.0)
O2 Saturation: 94 %
Patient temperature: 37
Potassium: 4.7 mmol/L (ref 3.5–5.1)
Sodium: 137 mmol/L (ref 135–145)
TCO2: 17 mmol/L — ABNORMAL LOW (ref 22–32)
pCO2 arterial: 30.8 mmHg — ABNORMAL LOW (ref 32–48)
pH, Arterial: 7.324 — ABNORMAL LOW (ref 7.35–7.45)
pO2, Arterial: 76 mmHg — ABNORMAL LOW (ref 83–108)

## 2022-01-27 LAB — CBG MONITORING, ED: Glucose-Capillary: 81 mg/dL (ref 70–99)

## 2022-01-27 LAB — BLOOD GAS, ARTERIAL
Acid-base deficit: 7.6 mmol/L — ABNORMAL HIGH (ref 0.0–2.0)
Bicarbonate: 16.2 mmol/L — ABNORMAL LOW (ref 20.0–28.0)
Drawn by: 29503
O2 Content: 4 L/min
O2 Saturation: 94.5 %
Patient temperature: 37
pCO2 arterial: 28 mmHg — ABNORMAL LOW (ref 32–48)
pH, Arterial: 7.37 (ref 7.35–7.45)
pO2, Arterial: 79 mmHg — ABNORMAL LOW (ref 83–108)

## 2022-01-27 LAB — MAGNESIUM: Magnesium: 2.7 mg/dL — ABNORMAL HIGH (ref 1.7–2.4)

## 2022-01-27 LAB — BASIC METABOLIC PANEL
Anion gap: 18 — ABNORMAL HIGH (ref 5–15)
BUN: 68 mg/dL — ABNORMAL HIGH (ref 6–20)
CO2: 16 mmol/L — ABNORMAL LOW (ref 22–32)
Calcium: 8.7 mg/dL — ABNORMAL LOW (ref 8.9–10.3)
Chloride: 103 mmol/L (ref 98–111)
Creatinine, Ser: 4.22 mg/dL — ABNORMAL HIGH (ref 0.44–1.00)
GFR, Estimated: 11 mL/min — ABNORMAL LOW (ref >60)
Glucose, Bld: 102 mg/dL — ABNORMAL HIGH (ref 70–99)
Potassium: 4.9 mmol/L (ref 3.5–5.1)
Sodium: 137 mmol/L (ref 135–145)

## 2022-01-27 LAB — CBC
HCT: 37.9 % (ref 36.0–46.0)
Hemoglobin: 12.2 g/dL (ref 12.0–15.0)
MCH: 28.8 pg (ref 26.0–34.0)
MCHC: 32.2 g/dL (ref 30.0–36.0)
MCV: 89.6 fL (ref 80.0–100.0)
Platelets: 106 10*3/uL — ABNORMAL LOW (ref 150–400)
RBC: 4.23 MIL/uL (ref 3.87–5.11)
RDW: 16.9 % — ABNORMAL HIGH (ref 11.5–15.5)
WBC: 9.8 10*3/uL (ref 4.0–10.5)
nRBC: 0.5 % — ABNORMAL HIGH (ref 0.0–0.2)

## 2022-01-27 LAB — BRAIN NATRIURETIC PEPTIDE: B Natriuretic Peptide: 750.4 pg/mL — ABNORMAL HIGH (ref 0.0–100.0)

## 2022-01-27 LAB — ECHOCARDIOGRAM COMPLETE
Area-P 1/2: 2.56 cm2
S' Lateral: 2.3 cm

## 2022-01-27 LAB — MRSA NEXT GEN BY PCR, NASAL: MRSA by PCR Next Gen: NOT DETECTED

## 2022-01-27 LAB — LACTIC ACID, PLASMA
Lactic Acid, Venous: 3.7 mmol/L (ref 0.5–1.9)
Lactic Acid, Venous: 5.2 mmol/L (ref 0.5–1.9)

## 2022-01-27 LAB — GLUCOSE, CAPILLARY: Glucose-Capillary: 93 mg/dL (ref 70–99)

## 2022-01-27 LAB — TROPONIN I (HIGH SENSITIVITY)
Troponin I (High Sensitivity): 710 ng/L (ref <18)
Troponin I (High Sensitivity): 754 ng/L (ref <18)

## 2022-01-27 SURGERY — RIGHT HEART CATH
Anesthesia: LOCAL

## 2022-01-27 MED ORDER — ALBUTEROL SULFATE HFA 108 (90 BASE) MCG/ACT IN AERS
2.0000 | INHALATION_SPRAY | Freq: Once | RESPIRATORY_TRACT | Status: AC
  Administered 2022-01-27: 2 via RESPIRATORY_TRACT

## 2022-01-27 MED ORDER — SODIUM CHLORIDE 0.9 % IV SOLN: INTRAVENOUS | Status: DC

## 2022-01-27 MED ORDER — MILRINONE LACTATE IN DEXTROSE 20-5 MG/100ML-% IV SOLN
0.2500 ug/kg/min | INTRAVENOUS | Status: DC
Start: 2022-01-27 — End: 2022-01-28

## 2022-01-27 MED ORDER — SODIUM CHLORIDE 0.9 % IV SOLN: INTRAVENOUS | Status: DC | PRN

## 2022-01-27 MED ORDER — SODIUM CHLORIDE 0.9% FLUSH: 3.0000 mL | INTRAVENOUS | Status: DC | PRN

## 2022-01-27 MED ORDER — PANTOPRAZOLE SODIUM 40 MG PO TBEC
40.0000 mg | DELAYED_RELEASE_TABLET | Freq: Every day | ORAL | Status: DC
  Administered 2022-01-28 – 2022-02-02 (×6): 40 mg via ORAL
  Filled 2022-01-27 (×6): qty 1

## 2022-01-27 MED ORDER — HEPARIN (PORCINE) IN NACL 1000-0.9 UT/500ML-% IV SOLN
INTRAVENOUS | Status: AC
  Filled 2022-01-27: qty 1000

## 2022-01-27 MED ORDER — IOHEXOL 300 MG/ML  SOLN: 75.0000 mL | Freq: Once | INTRAMUSCULAR | Status: DC | PRN

## 2022-01-27 MED ORDER — DOBUTAMINE IN D5W 4-5 MG/ML-% IV SOLN
7.5000 ug/kg/min | INTRAVENOUS | Status: DC
  Administered 2022-01-27: 5 ug/kg/min via INTRAVENOUS
  Administered 2022-01-28 – 2022-01-31 (×3): 7.5 ug/kg/min via INTRAVENOUS
  Filled 2022-01-27 (×4): qty 250

## 2022-01-27 MED ORDER — SODIUM CHLORIDE 0.9 % IV SOLN: 250.0000 mL | INTRAVENOUS | Status: DC | PRN

## 2022-01-27 MED ORDER — SODIUM CHLORIDE 0.9% FLUSH
3.0000 mL | Freq: Two times a day (BID) | INTRAVENOUS | Status: DC
  Administered 2022-01-27 – 2022-02-02 (×7): 3 mL via INTRAVENOUS

## 2022-01-27 MED ORDER — TECHNETIUM TC 99M MEDRONATE IV KIT
21.9000 | PACK | Freq: Once | INTRAVENOUS | Status: AC
  Administered 2022-01-27: 21.9 via INTRAVENOUS

## 2022-01-27 MED ORDER — ASPIRIN 81 MG PO TBEC: 81.0000 mg | DELAYED_RELEASE_TABLET | Freq: Every day | ORAL | Status: DC

## 2022-01-27 MED ORDER — SODIUM CHLORIDE 0.9 % IV SOLN: Freq: Once | INTRAVENOUS | Status: DC

## 2022-01-27 MED ORDER — ACETAMINOPHEN 325 MG PO TABS
650.0000 mg | ORAL_TABLET | ORAL | Status: DC | PRN
  Administered 2022-01-28 – 2022-01-30 (×3): 650 mg via ORAL
  Filled 2022-01-27 (×3): qty 2

## 2022-01-27 MED ORDER — HEPARIN (PORCINE) 25000 UT/250ML-% IV SOLN
1100.0000 [IU]/h | INTRAVENOUS | Status: DC
  Filled 2022-01-27: qty 250

## 2022-01-27 MED ORDER — ONDANSETRON HCL 4 MG/2ML IJ SOLN
INTRAMUSCULAR | Status: AC
  Filled 2022-01-27: qty 2

## 2022-01-27 MED ORDER — FUROSEMIDE 10 MG/ML IJ SOLN
80.0000 mg | Freq: Once | INTRAMUSCULAR | Status: AC
  Administered 2022-01-27: 80 mg via INTRAVENOUS
  Filled 2022-01-27: qty 8

## 2022-01-27 MED ORDER — LIDOCAINE HCL (PF) 1 % IJ SOLN
INTRAMUSCULAR | Status: AC
  Filled 2022-01-27: qty 30

## 2022-01-27 MED ORDER — HEPARIN BOLUS VIA INFUSION
4000.0000 [IU] | Freq: Once | INTRAVENOUS | Status: DC
  Filled 2022-01-27: qty 4000

## 2022-01-27 MED ORDER — SODIUM CHLORIDE 0.9% FLUSH
3.0000 mL | Freq: Two times a day (BID) | INTRAVENOUS | Status: DC
  Administered 2022-01-27 – 2022-02-02 (×11): 3 mL via INTRAVENOUS

## 2022-01-27 MED ORDER — TRAMADOL HCL 50 MG PO TABS: 50.0000 mg | ORAL_TABLET | Freq: Two times a day (BID) | ORAL | 0 refills | Status: AC | PRN

## 2022-01-27 MED ORDER — NOREPINEPHRINE 4 MG/250ML-% IV SOLN
0.0000 ug/min | INTRAVENOUS | Status: DC
  Administered 2022-01-27: 8 ug/min via INTRAVENOUS
  Administered 2022-01-27: 26 ug/min via INTRAVENOUS
  Administered 2022-01-27: 2 ug/min via INTRAVENOUS
  Administered 2022-01-28: 34 ug/min via INTRAVENOUS
  Filled 2022-01-27 (×4): qty 250

## 2022-01-27 MED ORDER — LIDOCAINE HCL (PF) 1 % IJ SOLN
INTRAMUSCULAR | Status: DC | PRN
  Administered 2022-01-27: 5 mL

## 2022-01-27 MED ORDER — SODIUM CHLORIDE (PF) 0.9 % IJ SOLN
INTRAMUSCULAR | Status: AC
  Filled 2022-01-27: qty 50

## 2022-01-27 MED ORDER — ASPIRIN 81 MG PO CHEW: 81.0000 mg | CHEWABLE_TABLET | ORAL | Status: DC

## 2022-01-27 MED ORDER — ONDANSETRON HCL 4 MG/2ML IJ SOLN
INTRAMUSCULAR | Status: DC | PRN
  Administered 2022-01-27: 4 mg via INTRAVENOUS

## 2022-01-27 MED ORDER — ONDANSETRON HCL 4 MG/2ML IJ SOLN: 4.0000 mg | Freq: Four times a day (QID) | INTRAMUSCULAR | Status: DC | PRN

## 2022-01-27 MED ORDER — LIDOCAINE 5 % EX PTCH
1.0000 | MEDICATED_PATCH | Freq: Every day | CUTANEOUS | Status: DC
  Administered 2022-01-27 – 2022-02-02 (×7): 1 via TRANSDERMAL
  Filled 2022-01-27 (×8): qty 1

## 2022-01-27 SURGICAL SUPPLY — 13 items
CATH SWAN GANZ VIP 7.5F (CATHETERS) ×1 IMPLANT
KIT HEART LEFT (KITS) ×3 IMPLANT
KIT MICROPUNCTURE NIT STIFF (SHEATH) ×1 IMPLANT
PACK CARDIAC CATHETERIZATION (CUSTOM PROCEDURE TRAY) ×3 IMPLANT
PROTECTION STATION PRESSURIZED (MISCELLANEOUS) ×2
SHEATH PINNACLE 4F 10CM (SHEATH) ×1 IMPLANT
SHEATH PINNACLE 6F 10CM (SHEATH) ×1 IMPLANT
SHEATH PINNACLE 8F 10CM (SHEATH) ×2 IMPLANT
SHEATH PROBE COVER 6X72 (BAG) ×1 IMPLANT
SLEEVE REPOSITIONING LENGTH 30 (MISCELLANEOUS) ×1 IMPLANT
STATION PROTECTION PRESSURIZED (MISCELLANEOUS) IMPLANT
TRANSDUCER W/STOPCOCK (MISCELLANEOUS) ×3 IMPLANT
WIRE MICROINTRODUCER 60CM (WIRE) ×1 IMPLANT

## 2022-01-27 NOTE — Consult Note (Signed)
NAME:  Rita Cole, MRN:  [id number], DOB:  Dec 27, 1960, LOS: 0 ADMISSION DATE:  02/16/2022, CONSULTATION DATE:  8/2 REFERRING MD:  Neena Rhymes FOR CONSULT: Medical management   History of Present Illness:  Rita Cole is a 61 y.o. F with a PMX of stage IIIc Breast cancer L breast, ER negative s/p chemo (carboplatin, paclitaxel, pembrolizumab) radiation and mastectomy, HTN, DM presented to ED 8/2 with SOB.  SOB had started a few weeks ago and was progressive such that on 7/31 pt presented to [facility] Ed in this setting. Associated chest discomfort. CTA chest r/o PE at that time but did have new RA and RV enlargement, as well as LUL consolidation, and extensive lymphadenopathy. She was dc home    Presented to Cancer center 8/1 and noted to be hypoxic, and was ordered home Oxygen, as well as an ECHO with concern for pHTN.    Pt then presented to Cancer center 8/2 for IVF and was found to be hypoxic and in respiratory distress during infusion. Rapid response was called and pt was transported to ED for Hypoxic, Hypotension, Resp Distress. In ED she was started on NE and O2 titrated. Labs revealed a new AKI and acute liver injury. There was concern for cardiogenic shock, so pt transferred [facility] ED [facility] ED, where Adv HF was consulted for suspected cardiogenic shock.   STAT ECHO obtained and shows marked RV failure -- D shaped septum, severely reduced RV systolic fxn, severely elevated RV, mildly elevated PASP, RVSP 43.1  Patient with right sided cardiogenic shock and was planned for urgent RHC + swan placement    Admitting to Cardiology service with Adv HF and PCCM consulted in this setting   Pertinent  Medical History  She has a past medical history of L breast cancer (5/22, treated w/chemo and radition, s/p mastectomy), crohn's, GERD, HTN.  Significant Hospital Events: Including procedures, antibiotic start and stop dates in addition to other pertinent events   8/2 rapid response  at cancer center for hypoxia hypotension resp distress. ED to ED transfer to [facility] for [facility] eval, found to have acute right sided heart failure with cardiogenic shock. Admit to cards, Adv HF and PCCM consult. Cath lab for [place] + swan.  Interim History / Subjective:  See above  Subjective: endorses continued SOB, denies chest pain  Objective   Blood pressure (!) 84/72, pulse 80, temperature 98.3 F (36.8 C), temperature source Rectal, resp. rate 20, height '5\' 6"'$  (1.676 m), weight 92.1 kg, SpO2 (!) 86 %.        Intake/Output Summary (Last 24 hours) at 02/16/2022 1831 Last data filed at 02/21/2022 1123 Gross per 24 hour  Intake 250 ml  Output --  Net 250 ml   Filed Weights   02/14/2022 1514  Weight: 92.1 kg    Examination: General: In bed, NAD, appears comfortable HEENT: MM pink/moist, anicteric, atraumatic Neuro: RASS 0, PERRL 65m, GCS 15 CV: S1S2, NSR, no m/r/g appreciated PULM:  air movement in all lobes, trachea midline, chest expansion symmetric GI: soft, bsx4 active, non-tender   Extremities: warm/dry, no pretibial edema, capillary refill less than 3 seconds  Skin:  no rashes or lesions noted   Labs/Imaging Platelets 117>128 Co2 21>20, AG 16 Creat 1.13>3.43 AST 99>181, ALT 90>153 Troponin 359>710>754, BNP 750 ABG 7.37/28/79/17 Lactic 3.7 7/31 CTA chest neg for PE. Concerning for PAH per RADS. New mediastinal, hilar, lower cervical, and right axillary lymphadenopathy, and new sclerotic lesion of T4, concerning for  disease recurrence/metastases. Prominent lower cervical lymph nodes as well. ECHO: LVEF 55-60, RV overload, D shaped septum, RVSF severely reduced. 12 lead: ST, no st changes noted CXR: no pneumo, LUL peripheral mass like consolidation> ?scarring, no clear infiltrate.   Resolved Hospital Problem list     Assessment & Plan:  Cardiogenic shock secondary to RV failure Lactic acidosis secondary to above ?NSTEMI ?PAH ECHO: LVEF 55-60, RV overload, D shaped  septum, RVSF severely reduced. Troponin 359>710>754, BNP 750. No fever. No WBC elevation. PE ruled out on CTA on 8/2. No clear etiology at this time. HX CA. -Management and workup per cardiology and HF team -Goal MAP 65. Titrate levophed to goal -Goal K above 4, goal MG above 2 -Starting dobutamine at 5 per cards. -'80mg'$  lasix IV x1 per Dr. Aundra Dubin -Continue heparin infusion  Acute respiratory failure with hypoxia Secondary to cardiogenic shock. On 4L [place] -Goal SPO2 92-98% -wean o2 to goal -Lasix as above  AKI Suspect cardio renal ,Creat 1.13>3.43 -Ensure renal perfusion. Goal MAP 65 or greater. -Avoid neprotoxic drugs as possible. -Strict I&O's -Follow up AM creatinine -foley  AGMA Secondary to lactic acidosis -Supportive care -Goal MAP greater than 65.  Transaminitis, suspect secondary to low flow state AST 99>181, ALT 90>153 -supportive care -trend LFT  Thrombocytopenia ?secondary to critical illness, Platelets 117>128  HX of L breast cancer (5/22, treated w/chemo and radition, s/p mastectomy) carboplatin, paclitaxel, pembrolizumab -outpatient onc follow up  HX HTN -Hold home antihypertensives  GERD -PPI  Best Practice (right click and "Reselect all SmartList Selections" daily)   Diet/type: NPO DVT prophylaxis: systemic heparin GI prophylaxis: PPI Lines: Central line and yes and it is still needed Foley:  Yes, and it is still needed Code Status:  full code Last date of multidisciplinary goals of care discussion [pending]  Labs   CBC: Recent Labs  Lab 01/25/22 1024 02/21/2022 1132  WBC 8.5 9.5  NEUTROABS  --  7.7  HGB 13.6 12.7  HCT 41.4 39.7  MCV 89.0 90.6  PLT 117* 128*    Basic Metabolic Panel: Recent Labs  Lab 01/25/22 1024 02/18/2022 1132  NA 140 139  K 3.9 4.7  CL 107 103  CO2 21* 20*  GLUCOSE 109* 87  BUN 23* 59*  CREATININE 1.13* 3.43*  CALCIUM 9.2 9.0   GFR: Estimated Creatinine Clearance: 19.9 mL/min (A) (by C-G formula based on  SCr of 3.43 mg/dL (H)). Recent Labs  Lab 01/25/22 1024 01/26/2022 1132 01/29/2022 1340  WBC 8.5 9.5  --   LATICACIDVEN  --   --  3.7*    Liver Function Tests: Recent Labs  Lab 01/25/22 1024 02/20/2022 1132  AST 99* 181*  ALT 90* 153*  ALKPHOS 103 126  BILITOT 0.8 1.0  PROT 7.5 6.8  ALBUMIN 3.8 3.5   No results for input(s): "LIPASE", "AMYLASE" in the last 168 hours. No results for input(s): "AMMONIA" in the last 168 hours.  ABG    Component Value Date/Time   PHART 7.37 02/12/2022 1354   PCO2ART 28 (L) 01/31/2022 1354   PO2ART 79 (L) 02/11/2022 1354   HCO3 16.2 (L) 02/09/2022 1354   ACIDBASEDEF 7.6 (H) 02/09/2022 1354   O2SAT 94.5 02/16/2022 1354   O2SAT 94.4 02/01/2022 1354     Coagulation Profile: No results for input(s): "INR", "PROTIME" in the last 168 hours.  Cardiac Enzymes: No results for input(s): "CKTOTAL", "CKMB", "CKMBINDEX", "TROPONINI" in the last 168 hours.  HbA1C: Hgb A1c MFr Bld  Date/Time Value Ref  Range Status  05/11/2021 09:35 AM 5.8 (H) 4.8 - 5.6 % Final    Comment:    (NOTE) Pre diabetes:          5.7%-6.4%  Diabetes:              >6.4%  Glycemic control for   <7.0% adults with diabetes     CBG: Recent Labs  Lab 02/07/2022 1126  GLUCAP 81    Review of Systems:   Positives in bold  Gen: fever, chills, weight change, fatigue, night sweats HEENT:  blurred vision, double vision, hearing loss, tinnitus, sinus congestion, rhinorrhea, sore throat, neck stiffness, dysphagia PULM:  shortness of breath, cough, sputum production, hemoptysis, wheezing CV: chest pain, edema, orthopnea, paroxysmal nocturnal dyspnea, palpitations GI:  abdominal pain, nausea, vomiting, diarrhea, hematochezia, melena, constipation, change in bowel habits GU: dysuria, hematuria, polyuria, oliguria, urethral discharge Endocrine: hot or cold intolerance, polyuria, polyphagia or appetite change Derm: rash, dry skin, scaling or peeling skin change Heme: easy bruising,  bleeding, bleeding gums Neuro: headache, numbness, weakness, slurred speech, loss of memory or consciousness   Past Medical History:  She,  has a past medical history of Breast cancer ([place]), Crohn's colitis ([place]), Family history of lung cancer (10/02/2020), GERD (gastroesophageal reflux disease), Hypertension, and Pre-diabetes.   Surgical History:   Past Surgical History:  Procedure Laterality Date   BREAST SURGERY     BUNIONECTOMY     FLEXIBLE SIGMOIDOSCOPY N/A 07/10/2021   Procedure: FLEXIBLE SIGMOIDOSCOPY;  Surgeon: Arta Silence, MD;  Location: [facility];  Service: Endoscopy;  Laterality: N/A;   IR IMAGING GUIDED PORT INSERTION  10/14/2020   MODIFIED MASTECTOMY Left 05/14/2021   Procedure: LEFT MODIFIED RADICAL MASTECTOMY;  Surgeon: Rolm Bookbinder, MD;  Location: [place];  Service: General;  Laterality: Left;   MYOMECTOMY     PORT-A-CATH REMOVAL Right 09/14/2021   Procedure: REMOVAL PORT-A-CATH;  Surgeon: Rolm Bookbinder, MD;  Location: [facility];  Service: General;  Laterality: Right;   TONSILLECTOMY     as a child     Social History:   reports that she has quit smoking. Her smoking use included cigarettes. She smoked an average of .25 packs per day. She has never used smokeless tobacco. She reports that she does not drink alcohol and does not use drugs.   Family History:  Her family history includes Cancer in an other family member; Heart attack in her father; Lung cancer in her father.   Allergies No Known Allergies   Home Medications  Prior to Admission medications   Medication Sig Start Date End Date Taking? Authorizing Provider  acetaminophen (TYLENOL) 500 MG tablet Take 500-1,000 mg by mouth every 6 (six) hours as needed for mild pain, headache or fever.   Yes [provider]  acetaminophen (TYLENOL) 650 MG CR tablet Take 1,300 mg by mouth 2 (two) times daily as needed for pain (arthritis).   Yes [provider]  amLODipine  (NORVASC) 10 MG tablet Take 1 tablet (10 mg total) by mouth daily. 03/31/21  Yes Nicholas Lose, MD  CALCIUM PO Take 1 tablet by mouth every morning.   Yes [provider]  Cyanocobalamin (VITAMIN B-12 PO) Take 1 tablet by mouth every morning.   Yes [provider]  ferrous sulfate 325 (65 FE) MG tablet Take 325 mg by mouth every morning.   Yes [provider]  fluticasone (FLONASE) 50 MCG/ACT nasal spray Place 1 spray into both nostrils daily as needed for allergies or  rhinitis.   Yes [provider]  gabapentin (NEURONTIN) 300 MG capsule Take 1 capsule (300 mg total) by mouth at bedtime. 01/26/22  Yes Nicholas Lose, MD  loratadine (CLARITIN) 10 MG tablet Take 10 mg by mouth daily as needed for allergies.   Yes [provider]  losartan (COZAAR) 100 MG tablet Take 100 mg by mouth every morning. 02/26/21  Yes [provider]  MAGNESIUM PO Take 1 tablet by mouth every morning.   Yes [provider]  metoprolol succinate (TOPROL-XL) 50 MG 24 hr tablet Take 25 mg by mouth every morning. 02/26/21  Yes [provider]  POTASSIUM PO Take 1 tablet by mouth every morning.   Yes [provider]  Vitamin D, Ergocalciferol, (DRISDOL) 1.25 MG (50000 UNIT) CAPS capsule Take 50,000 Units by mouth every Tuesday. 08/19/20  Yes [provider]  budesonide (ENTOCORT EC) 3 MG 24 hr capsule Take 9 mg by mouth daily. Patient not taking: Reported on 01/25/2022    [provider]  Potassium Chloride ER 20 MEQ TBCR Take 1 tablet by mouth daily. Patient not taking: Reported on 01/25/2022 01/13/22   [provider]  traMADol (ULTRAM) 50 MG tablet Take 1 tablet (50 mg total) by mouth every 12 (twelve) hours as needed. Patient not taking: Reported on 02/09/2022 02/09/2022   Nicholas Lose, MD  cetirizine (ZYRTEC ALLERGY) 10 MG tablet Take 1 tablet (10 mg total) by mouth daily. Patient not taking: Reported on 09/06/2020 05/29/17 09/06/20   Jaynee Eagles, PA-C  prochlorperazine (COMPAZINE) 10 MG tablet Take 1 tablet (10 mg total) by mouth every 6 (six) hours as needed (Nausea or vomiting). 10/01/20 05/27/21  Nicholas Lose, MD     Critical care time: 40 minutes    [employer]mond [employer]., MSN, APRN, AGACNP-BC [facility]  02/05/2022 , 6:31 PM  Please see [email or web address] for pager details  If no response, please call [phone] After hours, please call Elink at [phone]

## 2022-01-27 NOTE — ED Triage Notes (Signed)
Pt brought from [facility] center. Pt hypotensive, and O2 sats of 88. Placed on Max flow NRB. Pt has not been eating or drinking much for the past few days.

## 2022-01-27 NOTE — Interval H&P Note (Signed)
History and Physical Interval Note:  01/29/2022 4:59 PM  Rita Cole  has presented today for surgery, with the diagnosis of urgent.  The various methods of treatment have been discussed with the patient and family. After consideration of risks, benefits and other options for treatment, the patient has consented to  Procedure(s): RIGHT HEART CATH (N/A) as a surgical intervention.  The patient's history has been reviewed, patient examined, no change in status, stable for surgery.  I have reviewed the patient's chart and labs.  Questions were answered to the patient's satisfaction.     Raliyah Montella [employer]

## 2022-01-27 NOTE — Progress Notes (Signed)
Per MD pt needing IVF over the next 3 days for dehydration.  Orders placed, message sent to scheduling.

## 2022-01-27 NOTE — Progress Notes (Signed)
Echocardiogram 2D Echocardiogram has been performed.  Oneal Deputy Elisheba Mcdonnell RDCS 02/14/2022, 3:45 PM  Dr. Ali Lowe at bedside

## 2022-01-27 NOTE — ED Notes (Signed)
Pt placed in trendelenburg d/t hypotension

## 2022-01-27 NOTE — Procedures (Signed)
Arterial Line Insertion Start/End8/07/2021 8:20 PM  Patient location: ICU. Preanesthetic checklist: patient identified, risks and benefits discussed, surgical consent, monitors and equipment checked and timeout performed Right, radial was placed Catheter size: 20 G Hand hygiene performed  and maximum sterile barriers used  Allen's test indicative of satisfactory collateral circulation Attempts: 1 Procedure performed without using ultrasound guided technique. Following insertion, Biopatch and dressing applied. Post procedure assessment: normal and unchanged  Patient tolerated the procedure well with no immediate complications.

## 2022-01-27 NOTE — Progress Notes (Addendum)
eLink Physician-Brief Progress Note Patient Name: Rita Cole DOB: 1961/02/16 MRN: [id number]   Date of Service  02/16/2022  HPI/Events of Note  Patient needs a Foley catheter for hourly urine output monitoring.  eICU Interventions  Foley catheter ordered. ABG result reviewed.        [name] [name] 02/22/2022, 9:50 PM

## 2022-01-27 NOTE — ED Provider Notes (Signed)
[facility] DEPT Provider Note   CSN: [id number] Arrival date & time: 02/24/2022  1117     History  Chief Complaint  Patient presents with   Hypotension    Rita Cole is a 61 y.o. female.  Patient is a 61 year old female with a history of hypertension, prediabetes, Crohn's colitis, breast cancer status post chemo and radiation who is returning to the emergency room today with shortness of breath, hypoxia, hypotension and tachypnea.  Patient's daughter gives most of the history and reports that her mom seemed fairly normal last Monday when she babysat her 40-year-old twins but did report that she was tired.  However as the week progressed her symptoms worsened and over the weekend she reports her mom was very short of breath having left-sided chest pain and also having dyspnea on exertion.  She was sleeping sitting up eating very little and just not herself.  She was seen in the emergency room over the weekend and at that time was found to have elevated troponins x2 in the 300s which were changing significantly also had a CTA of her chest that showed no evidence of PE but possible cancer recurrence and scar tissue versus pneumonia.  Provider had spoken with the patient's oncologist who at that time did not want to proceed with any new treatment and wanted to see her at her scheduled appointment.  Over the weekend patient's symptoms continue to be significant to the point where her daughter started staying with her around-the-clock because she was worried about her.  Patient was going for a PET scan and CT of her chest today but upon her arrival she was hypoxic, tachypneic and wheezing.  The oncology PA reports that she gave her albuterol prior to arrival and placed her on a nonrebreather.  Patient does not typically require oxygen but was started on it yesterday after being found hypoxic in her doctor's office.  She has not had a new cough she denies any sputum.   She continues to have pain in her left side of her chest that wraps around underneath her arm and does seem worse when she lifts her arm.  She has not had fever, abdominal pain, nausea or vomiting but does have poor appetite.  The history is provided by the patient, medical records and a relative.       Home Medications Prior to Admission medications   Medication Sig Start Date End Date Taking? Authorizing Provider  acetaminophen (TYLENOL) 500 MG tablet Take 500-1,000 mg by mouth every 6 (six) hours as needed for mild pain, headache or fever.    [provider]  acetaminophen (TYLENOL) 650 MG CR tablet Take 1,300 mg by mouth 2 (two) times daily as needed for pain (arthritis).    [provider]  amLODipine (NORVASC) 10 MG tablet Take 1 tablet (10 mg total) by mouth daily. 03/31/21   Nicholas Lose, MD  budesonide (ENTOCORT EC) 3 MG 24 hr capsule Take 9 mg by mouth daily. Patient not taking: Reported on 01/25/2022    [provider]  CALCIUM PO Take 1 tablet by mouth every morning.    [provider]  Cyanocobalamin (VITAMIN B-12 PO) Take 1 tablet by mouth every morning.    [provider]  ferrous sulfate 325 (65 FE) MG tablet Take 325 mg by mouth every morning.    [provider]  fluticasone (FLONASE) 50 MCG/ACT nasal spray Place 1 spray into both nostrils daily as needed for allergies or  rhinitis.    [provider]  gabapentin (NEURONTIN) 300 MG capsule Take 1 capsule (300 mg total) by mouth at bedtime. 01/26/22   Nicholas Lose, MD  loratadine (CLARITIN) 10 MG tablet Take 10 mg by mouth daily as needed for allergies.    [provider]  losartan (COZAAR) 100 MG tablet Take 100 mg by mouth every morning. 02/26/21   [provider]  MAGNESIUM PO Take 1 tablet by mouth every morning.    [provider]  metoprolol succinate (TOPROL-XL) 50 MG 24 hr tablet Take 25 mg by mouth every morning. 02/26/21   [provider]  Potassium Chloride ER 20 MEQ TBCR Take 1 tablet by mouth daily. Patient not taking: Reported on 01/25/2022 01/13/22   [provider]  POTASSIUM PO Take 1 tablet by mouth every morning.    [provider]  traMADol (ULTRAM) 50 MG tablet Take 1 tablet (50 mg total) by mouth every 12 (twelve) hours as needed. 02/08/2022   Nicholas Lose, MD  Vitamin D, Ergocalciferol, (DRISDOL) 1.25 MG (50000 UNIT) CAPS capsule Take 50,000 Units by mouth every Tuesday. 08/19/20   [provider]  cetirizine (ZYRTEC ALLERGY) 10 MG tablet Take 1 tablet (10 mg total) by mouth daily. Patient not taking: Reported on 09/06/2020 05/29/17 09/06/20  Jaynee Eagles, PA-C  prochlorperazine (COMPAZINE) 10 MG tablet Take 1 tablet (10 mg total) by mouth every 6 (six) hours as needed (Nausea or vomiting). 10/01/20 05/27/21  Nicholas Lose, MD      Allergies    Patient has no known allergies.    Review of Systems   Review of Systems  Physical Exam Updated Vital Signs BP (!) 88/64   Pulse 80   Temp 98.3 F (36.8 C) (Rectal)   Resp (!) 23   SpO2 90%  Physical Exam Vitals and nursing note reviewed.  Constitutional:      General: She is in acute distress.     Appearance: She is well-developed. She is ill-appearing.  HENT:     Head: Normocephalic and atraumatic.     Mouth/Throat:     Mouth: Mucous membranes are dry.  Eyes:     Pupils: Pupils are equal, round, and reactive to light.  Cardiovascular:     Rate and Rhythm: Normal rate and regular rhythm.     Pulses: Normal pulses.     Heart sounds: Normal heart sounds. No murmur heard.    No friction rub.  Pulmonary:     Effort: Pulmonary effort is normal. Tachypnea present.     Breath sounds: Examination of the right-lower field reveals decreased breath sounds. Examination of the left-lower field reveals decreased breath sounds. Decreased breath sounds present. No wheezing or rales.  Abdominal:     General: Bowel sounds are normal. There  is no distension.     Palpations: Abdomen is soft.     Tenderness: There is no abdominal tenderness. There is no guarding or rebound.  Musculoskeletal:        General: No tenderness. Normal range of motion.     Comments: Trace edema in bilateral lower extremities  Skin:    General: Skin is warm and dry.     Findings: No rash.  Neurological:     Mental Status: She is alert and oriented to person, place, and time. Mental status is at baseline.     Cranial Nerves: No cranial nerve deficit.  Psychiatric:        Mood and Affect: Mood normal.  Behavior: Behavior normal.     ED Results / Procedures / Treatments   Labs (all labs ordered are listed, but only abnormal results are displayed) Labs Reviewed  CBC WITH DIFFERENTIAL/PLATELET - Abnormal; Notable for the following components:      Result Value   RDW 16.6 (*)    Platelets 128 (*)    nRBC 0.3 (*)    All other components within normal limits  COMPREHENSIVE METABOLIC PANEL - Abnormal; Notable for the following components:   CO2 20 (*)    BUN 59 (*)    Creatinine, Ser 3.43 (*)    AST 181 (*)    ALT 153 (*)    GFR, Estimated 15 (*)    Anion gap 16 (*)    All other components within normal limits  BRAIN NATRIURETIC PEPTIDE - Abnormal; Notable for the following components:   B Natriuretic Peptide 750.4 (*)    All other components within normal limits  BLOOD GAS, VENOUS - Abnormal; Notable for the following components:   pO2, Ven <31 (*)    Acid-base deficit 5.9 (*)    All other components within normal limits  TROPONIN I (HIGH SENSITIVITY) - Abnormal; Notable for the following components:   Troponin I (High Sensitivity) 710 (*)    All other components within normal limits  URINALYSIS, ROUTINE W REFLEX MICROSCOPIC  BLOOD GAS, ARTERIAL  COOXEMETRY PANEL  LACTIC ACID, PLASMA  LACTIC ACID, PLASMA  CBG MONITORING, ED  TROPONIN I (HIGH SENSITIVITY)    EKG EKG Interpretation  Date/Time:  Wednesday January 27 2022  11:37:30 EDT Ventricular Rate:  80 PR Interval:  153 QRS Duration: 95 QT Interval:  438 QTC Calculation: 506 R Axis:   107 Text Interpretation: Sinus rhythm Anterior infarct, age indeterminate Nonspecific T wave abnormality No significant change since last tracing 01/25/22 but different from EKG 11/22 Confirmed by Blanchie Dessert [phone]) on 02/09/2022 11:50:09 AM  Radiology No results found.  Procedures Procedures    Medications Ordered in ED Medications  norepinephrine (LEVOPHED) '4mg'$  in 293m (0.016 mg/mL) premix infusion (has no administration in time range)    ED Course/ Medical Decision Making/ A&P                           Medical Decision Making Amount and/or Complexity of Data Reviewed Independent Historian: caregiver    Details: daughter External Data Reviewed: notes. Labs: ordered. Decision-making details documented in ED Course. Radiology: ordered and independent interpretation performed. Decision-making details documented in ED Course. ECG/medicine tests: ordered and independent interpretation performed. Decision-making details documented in ED Course.  Risk Prescription drug management. Decision regarding hospitalization.   Pt with multiple medical problems and comorbidities and presenting today with a complaint that caries a high risk for morbidity and mortality.  Here today with respiratory distress and failure with hypoxia at her oncologist office requiring nonrebreather.  Patient was placed on oxygen yesterday due to hypoxia when she saw her oncologist.  Patient is tachypneic here with JVD noted but not significant peripheral edema.  She has some decreased breath sounds bilaterally and blood pressure of 88/75.  Concern for possible heart failure given patient's known elevated troponins over the weekend, CTA that was negative for PE but did show right-sided enlargement.  Patient's last echo was 1 year ago and at that time EF was 60 to 65%.  Also concern for  possible infectious etiology versus pleural effusions versus anemia versus AKI.  I independently interpreted patient's  EKG today which shows T wave inversion anteriorly which is unchanged from her EKG on 01/25/2022 but different from her EKG November 2022.  Patient was to get a CT of her chest today which we will do for further inspection of her shortness of breath.  Repeat labs including troponins and BNP are pending.  1:39 PM I independently interpreted patient's labs today and VBG without acute findings, CBC within normal limits, CMP is concerning for new AKI with creatinine of 3.41 from her baseline of 1.3, BUN of 59, AST of 181 and ALT of 153 which is slightly elevated from her baseline, troponin today is elevated at 710 and BNP is 750.  Patient's blood pressure remains between 29J to 90 systolic.  On repeat evaluation patient was taken off the nonrebreather and placed on 4 L nasal cannula with sats maintaining at 97%.  Concern the patient has cardiogenic shock resulting in endorgan damage.  Will discuss with cardiology as feel that patient needs emergent echo and may need to start dobutamine or other pressor to increase contractility.  Patient had received fluids at the cancer center which made her symptoms worse.  1:39 PM Spoke with cardiology Dr. Ali Lowe.  Coags, ABG and lactate are pending.  Patient started on Levophed to help with pressure.  Patient has been unchanged and continues to be stable on 4 L of oxygen.  Findings discussed with patient and her daughter.  Patient will be transferred ED to ED to expedite cardiology care.  CRITICAL CARE Performed by: Parveen Freehling Total critical care time: 45 minutes Critical care time was exclusive of separately billable procedures and treating other patients. Critical care was necessary to treat or prevent imminent or life-threatening deterioration. Critical care was time spent personally by me on the following activities: development of treatment  plan with patient and/or surrogate as well as nursing, discussions with consultants, evaluation of patient's response to treatment, examination of patient, obtaining history from patient or surrogate, ordering and performing treatments and interventions, ordering and review of laboratory studies, ordering and review of radiographic studies, pulse oximetry and re-evaluation of patient's condition.          Final Clinical Impression(s) / ED Diagnoses Final diagnoses:  Cardiogenic shock ([place])  AKI (acute kidney injury) ([employer])  Acute congestive heart failure, unspecified heart failure type [facility])    Rx / DC Orders ED Discharge Orders     None         Blanchie Dessert, MD 01/30/2022 1339

## 2022-01-27 NOTE — Progress Notes (Signed)
Symptom Management Consult note [place]    Patient Care Team: Riki Sheer, NP as PCP - General (Nurse Practitioner) Rolm Bookbinder, MD as Consulting Physician (General Surgery) Nicholas Lose, MD as Consulting Physician (Hematology and Oncology) Kyung Rudd, MD as Consulting Physician (Radiation Oncology)    Name of the patient: Rita Cole  [id number]  March 28, 1961   Date of visit: 02/18/2022    Chief complaint/ Reason for visit- shortness of breath  Oncology History  Malignant neoplasm of upper-outer quadrant of left breast in female, estrogen receptor negative ([place])  09/30/2020 Initial Diagnosis   Swollen left breast: Mammogram detected left breast mass UOQ skin thickening with 3 abnormal lymph nodes that are matted and hard, ultrasound 2.7 cm, 2.9 cm, 3 cm and 3 lymph nodes biopsy revealed grade 2-3 IDC with DCIS ER/PR negative HER-2 IHC equivocal FISH pending, Ki-67 60%, lymph node biopsy positive   10/01/2020 Cancer Staging   Staging form: Breast, AJCC 8th Edition - Clinical stage from 10/01/2020: Stage IIIC (cT4b, cN2a, cM0, G3, ER-, PR-, HER2: Equivocal) - Signed by Nicholas Lose, MD on 10/01/2020 Stage prefix: Initial diagnosis Histologic grading system: 3 grade system   10/13/2020 Genetic Testing   Negative hereditary cancer genetic testing: no pathogenic variants detected in [facility] CustomNext-Cancer +RNAinsight Panel.  The report date is October 13, 2020.   The CustomNext-Cancer+RNAinsight panel offered by [facility] includes sequencing and rearrangement analysis for the following 47 genes:  APC, ATM, AXIN2, BARD1, BMPR1A, BRCA1, BRCA2, BRIP1, CDH1, CDK4, CDKN2A, CHEK2, DICER1, EPCAM, GREM1, HOXB13, MEN1, MLH1, MSH2, MSH3, MSH6, MUTYH, NBN, NF1, NF2, NTHL1, PALB2, PMS2, POLD1, POLE, PTEN, RAD51C, RAD51D, RECQL, RET, SDHA, SDHAF2, SDHB, SDHC, SDHD, SMAD4, SMARCA4, STK11, TP53, TSC1, TSC2, and VHL.  RNA data is routinely analyzed for use in  variant interpretation for all genes.   01/13/2021 - 04/14/2021 Chemotherapy   Patient is on Treatment Plan : BREAST Pembrolizumab + AC q21d x 4 cycles / Pembrolizumab + Carboplatin D1 + Paclitaxel D1,8,15 q21d X 4 cycles     05/14/2021 Surgery   Left modified radical mastectomy: Metastatic carcinoma in 3/7 lymph nodes, no residual invasive cancer in the breast, no extranodal extension, ER 0%, PR 0%, HER2 negative, Ki-67 60%   05/14/2021 Cancer Staging   Staging form: Breast, AJCC 8th Edition - Pathologic stage from 05/14/2021: No Stage Recommended (ypT0, pN1a, cM0) - Signed by Gardenia Phlegm, NP on 06/05/2021 Stage prefix: Post-therapy   06/05/2021 - 06/26/2021 Chemotherapy   Patient is on Treatment Plan : BREAST Pembrolizumab q21d       Current Therapy: None. Finished therapy in 05/2021 with Keytruda.    Interval history- Rita Cole is a 61 y.o. with oncologic history as above seen in the infusion center for sudden onset of hypotension and hypoxia.  Patient was seen in the ED on 7/31 for shortness of breath and generalized weakness with discomfort in her left upper chest x1 week.  She was ultimately discharged home after work-up including COVID testing, DVT study, CTA and lab work.  Her labs were significant for elevated troponins in the 300 and CTA was negative for PE.  Radiologist commented on new sclerotic lesion of T4 which could be concerning for disease recurrence/metastasis as well as new right atrial and right ventricular enlargement to suggest pulmonary hypertension.  Patient was seen in oncologist office yesterday and found to be hypoxic to 88%.  She was set up on home oxygen which was delivered last night.  Patient's daughter is at the bedside and provides additional history.  She states for the last several days her mother has been feeling unwell.  Yesterday she could just not get comfortable and is complaining of a headache as well as significant shortness of  breath.  She was having shortness of breath at rest and it worsened anytime she tried to ambulate.  She wore 2 L overnight and took it off this morning when she woke up.  Patient was scheduled for a whole-body scan today and found to be hypoxic while receiving IV fluids in the infusion room.  Patient had significant work of breathing and was tripoding so nasal cannula was applied.  Patient admitted to not feeling well with the shortness of breath and a headache.  She denies any recent sick contacts. Denies any chest pain currently.  She does take blood pressure medications however did not take them this morning.      ROS  All other systems are reviewed and are negative for acute change except as noted in the HPI.    No Known Allergies   Past Medical History:  Diagnosis Date   Breast cancer ([place])    left breast IDC   Crohn's colitis ([place])    Family history of lung cancer 10/02/2020   GERD (gastroesophageal reflux disease)    Hypertension    Pre-diabetes      Past Surgical History:  Procedure Laterality Date   BREAST SURGERY     BUNIONECTOMY     FLEXIBLE SIGMOIDOSCOPY N/A 07/10/2021   Procedure: FLEXIBLE SIGMOIDOSCOPY;  Surgeon: Arta Silence, MD;  Location: [facility];  Service: Endoscopy;  Laterality: N/A;   IR IMAGING GUIDED PORT INSERTION  10/14/2020   MODIFIED MASTECTOMY Left 05/14/2021   Procedure: LEFT MODIFIED RADICAL MASTECTOMY;  Surgeon: Rolm Bookbinder, MD;  Location: [place];  Service: General;  Laterality: Left;   MYOMECTOMY     PORT-A-CATH REMOVAL Right 09/14/2021   Procedure: REMOVAL PORT-A-CATH;  Surgeon: Rolm Bookbinder, MD;  Location: [facility];  Service: General;  Laterality: Right;   TONSILLECTOMY     as a child    Social History   Socioeconomic History   Marital status: Divorced    Spouse name: Not on file   Number of children: Not on file   Years of education: Not on file   Highest education level: Not on file  Occupational  History   Not on file  Tobacco Use   Smoking status: Former    Packs/day: 0.25    Types: Cigarettes   Smokeless tobacco: Never  Vaping Use   Vaping Use: Never used  Substance and Sexual Activity   Alcohol use: No   Drug use: No   Sexual activity: Not Currently    Birth control/protection: Post-menopausal  Other Topics Concern   Not on file  Social History Narrative   Not on file   Social Determinants of Health   Financial Resource Strain: Not on file  Food Insecurity: Not on file  Transportation Needs: Not on file  Physical Activity: Not on file  Stress: Not on file  Social Connections: Not on file  Intimate Partner Violence: Not on file    Family History  Problem Relation Age of Onset   Heart attack Father    Lung cancer Father        dx unknown age   Cancer Other        MGM's mother; unknown type; dx unknown age    No current  facility-administered medications for this visit.  Current Outpatient Medications:    acetaminophen (TYLENOL) 500 MG tablet, Take 500-1,000 mg by mouth every 6 (six) hours as needed for mild pain, headache or fever., Disp: , Rfl:    acetaminophen (TYLENOL) 650 MG CR tablet, Take 1,300 mg by mouth 2 (two) times daily as needed for pain (arthritis)., Disp: , Rfl:    amLODipine (NORVASC) 10 MG tablet, Take 1 tablet (10 mg total) by mouth daily., Disp: , Rfl:    budesonide (ENTOCORT EC) 3 MG 24 hr capsule, Take 9 mg by mouth daily. (Patient not taking: Reported on 01/25/2022), Disp: , Rfl:    CALCIUM PO, Take 1 tablet by mouth every morning., Disp: , Rfl:    Cyanocobalamin (VITAMIN B-12 PO), Take 1 tablet by mouth every morning., Disp: , Rfl:    ferrous sulfate 325 (65 FE) MG tablet, Take 325 mg by mouth every morning., Disp: , Rfl:    fluticasone (FLONASE) 50 MCG/ACT nasal spray, Place 1 spray into both nostrils daily as needed for allergies or rhinitis., Disp: , Rfl:    gabapentin (NEURONTIN) 300 MG capsule, Take 1 capsule (300 mg total) by mouth  at bedtime., Disp: 30 capsule, Rfl: 3   loratadine (CLARITIN) 10 MG tablet, Take 10 mg by mouth daily as needed for allergies., Disp: , Rfl:    losartan (COZAAR) 100 MG tablet, Take 100 mg by mouth every morning., Disp: , Rfl:    MAGNESIUM PO, Take 1 tablet by mouth every morning., Disp: , Rfl:    metoprolol succinate (TOPROL-XL) 50 MG 24 hr tablet, Take 25 mg by mouth every morning., Disp: , Rfl:    Potassium Chloride ER 20 MEQ TBCR, Take 1 tablet by mouth daily. (Patient not taking: Reported on 01/25/2022), Disp: , Rfl:    POTASSIUM PO, Take 1 tablet by mouth every morning., Disp: , Rfl:    traMADol (ULTRAM) 50 MG tablet, Take 1 tablet (50 mg total) by mouth every 12 (twelve) hours as needed., Disp: 60 tablet, Rfl: 0   Vitamin D, Ergocalciferol, (DRISDOL) 1.25 MG (50000 UNIT) CAPS capsule, Take 50,000 Units by mouth every Tuesday., Disp: , Rfl:   Facility-Administered Medications Ordered in Other Visits:    [START ON 01/29/2022] 0.9 %  sodium chloride infusion, , Intravenous, Continuous, Gudena, Vinay, MD   0.9 %  sodium chloride infusion, , Intravenous, Continuous, Gudena, Vinay, MD, Last Rate: 500 mL/hr at 01/29/2022 1027, New Bag at 02/12/2022 1027   iohexol (OMNIPAQUE) 300 MG/ML solution 75 mL, 75 mL, Intravenous, Once PRN, Causey, Larna Daughters, NP   sodium chloride (PF) 0.9 % injection, , , ,   PHYSICAL EXAM: ECOG FS:1 - Symptomatic but completely ambulatory   BP: 61/48   HR: 82  O2: 98% on 4L Physical Exam Vitals and nursing note reviewed.  Constitutional:      General: She is in acute distress.     Appearance: She is well-developed. She is ill-appearing. She is not toxic-appearing.  HENT:     Head: Normocephalic and atraumatic.     Nose: Nose normal.  Eyes:     General: No scleral icterus.       Right eye: No discharge.        Left eye: No discharge.     Conjunctiva/sclera: Conjunctivae normal.  Neck:     Vascular: No JVD.  Cardiovascular:     Rate and Rhythm: Normal rate  and regular rhythm.     Pulses: Normal pulses.  Heart sounds: Normal heart sounds.  Pulmonary:     Comments: Patient tripoding with diffuse wheezing. Abdominal:     General: There is no distension.  Musculoskeletal:        General: Normal range of motion.     Cervical back: Normal range of motion.     Right lower leg: No edema.     Left lower leg: No edema.  Skin:    General: Skin is warm and dry.  Neurological:     Mental Status: She is oriented to person, place, and time.     GCS: GCS eye subscore is 4. GCS verbal subscore is 5. GCS motor subscore is 6.     Comments: Fluent speech, no facial droop.  Psychiatric:        Behavior: Behavior normal.        LABORATORY DATA: I have reviewed the data as listed    Latest Ref Rng & Units 01/25/2022   10:24 AM 07/17/2021    8:16 AM 07/13/2021    5:14 AM  CBC  WBC 4.0 - 10.5 K/uL 8.5  12.6  11.9   Hemoglobin 12.0 - 15.0 g/dL 13.6  11.4  10.6   Hematocrit 36.0 - 46.0 % 41.4  34.4  33.0   Platelets 150 - 400 K/uL 117  249  220         Latest Ref Rng & Units 01/25/2022   10:24 AM 07/17/2021    8:16 AM 07/13/2021    5:14 AM  CMP  Glucose 70 - 99 mg/dL 109  121  133   BUN 6 - 20 mg/dL 23  33  20   Creatinine 0.44 - 1.00 mg/dL 1.13  1.13  0.75   Sodium 135 - 145 mmol/L 140  139  139   Potassium 3.5 - 5.1 mmol/L 3.9  3.8  3.4   Chloride 98 - 111 mmol/L 107  100  104   CO2 22 - 32 mmol/L 21  32  28   Calcium 8.9 - 10.3 mg/dL 9.2  9.3  9.0   Total Protein 6.5 - 8.1 g/dL 7.5  6.5  5.9   Total Bilirubin 0.3 - 1.2 mg/dL 0.8  0.3  0.3   Alkaline Phos 38 - 126 U/L 103  42  38   AST 15 - 41 U/L 99  19  24   ALT 0 - 44 U/L 90  37  24        RADIOGRAPHIC STUDIES (from last 24 hours if applicable) I have personally reviewed the radiological images as listed and agreed with the findings in the report. No results found.     ASSESSMENT & PLAN: Patient is a 61 y.o. female  with oncologic history of malignant neoplasm upper-outer  quadrant of left breast, ER negative followed by Dr. Lindi Adie.  I have viewed most recent oncology and ED notes as well as lab work.   #) Shortness of breath-patient in acute distress on my arrival.  She is hypotensive and hypoxic.  She has diffuse wheezing and is tripoding.  She was given 2 puffs of albuterol inhaler and symptoms worsened even more.  She was put on nonrebreather.  A liter of IV fluids were started for hypotension.  A second line was obtained.  EKG was obtained attained and showed normal sinus rhythm with prolonged QT.  Rapid response was called and patient was transported to ED.  Report was given to ED MD.  #)  malignant neoplasm upper-outer quadrant  of left breast, ER negative - Next appointment with oncologist is 01/29/22. Patient unable to complete full body scan today because she became unstable. Oncologist made aware.   Visit Diagnosis: 1. Shortness of breath   2. Malignant neoplasm of upper-outer quadrant of left breast in female, estrogen receptor negative ([place])      Orders Placed This Encounter  Procedures   EKG 12-Lead    All questions were answered. The patient knows to call the clinic with any problems, questions or concerns. No barriers to learning was detected.  I have spent a total of 30 minutes minutes of face-to-face and non-face-to-face time, preparing to see the patient, obtaining and/or reviewing separately obtained history, performing a medically appropriate examination, counseling and educating the patient, ordering tests, documenting clinical information in the electronic health record, and care coordination (communications with other health care professionals or caregivers).    Thank you for allowing me to participate in the care of this patient.    Barrie Folk, PA-C Department of Hematology/Oncology [facility] at [facility] Phone: [phone]  [phone]    02/11/2022 11:35 AM

## 2022-01-27 NOTE — Progress Notes (Signed)
Anda Kraft PA at bedside to assess patient.  Pt c/o lightheadness, SHOB, unable to get comfortable.  Pts daughter at bedside and is historian.

## 2022-01-27 NOTE — H&P (Signed)
Cardiology Admission History and Physical:   Patient ID: Rita Cole MRN: [id number]; DOB: [birth year]   Admission date: 01/29/2022  PCP:  Riki Sheer, NP   [facility] Providers Cardiologist:  None        Chief Complaint: Dyspnea and failure to thrive    History of Present Illness:   Rita Cole is a 61 year old female with a history of breast cancer status postchemotherapy consisting of carboplatin, paclitaxel, and Pembrolizumab with mastectomy, hypertension, prediabetes who presented to [facility] long hospital a few days ago with shortness of breath and weakness.  Her troponin was found to be elevated as were her LFTs.  A CT scan demonstrated RV and RA dilatation but no pulmonary embolism.  She also had a new lesion in T4 suggestive of recurrent cancer.  She was ultimately discharged discharged and was seen yesterday in the oncology clinic.  Her blood pressure was 93/65 and her sats were 86%.  She was quite short of breath.  She was referred for IV fluids today in the infusion center.  She was very hypotensive and looked quite unwell.  She was transferred to the emergency department where troponin was found to be 710 and a BNP was over 700.  Her EKG demonstrated sinus rhythm with anterior infarction pattern.  Her LFTs were abnormal and her creatinine was up to 3.4.  Lactate was also elevated around 3.  She endorses shortness of breath and orthopnea over the last several days.  She denies any exertional chest pain or syncope.  She does endorse early satiety.  She denies any peripheral edema.   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   Crohn's colitis ([place])    Family history of lung cancer 10/02/2020   GERD (gastroesophageal reflux disease)    Hypertension    Pre-diabetes     Past Surgical History:  Procedure Laterality Date   BREAST SURGERY     BUNIONECTOMY     FLEXIBLE SIGMOIDOSCOPY N/A 07/10/2021   Procedure:  FLEXIBLE SIGMOIDOSCOPY;  Surgeon: Arta Silence, MD;  Location: [facility];  Service: Endoscopy;  Laterality: N/A;   IR IMAGING GUIDED PORT INSERTION  10/14/2020   MODIFIED MASTECTOMY Left 05/14/2021   Procedure: LEFT MODIFIED RADICAL MASTECTOMY;  Surgeon: Rolm Bookbinder, MD;  Location: [place];  Service: General;  Laterality: Left;   MYOMECTOMY     PORT-A-CATH REMOVAL Right 09/14/2021   Procedure: REMOVAL PORT-A-CATH;  Surgeon: Rolm Bookbinder, MD;  Location: [place];  Service: General;  Laterality: Right;   TONSILLECTOMY     as a child     Medications Prior to Admission: Prior to Admission medications   Medication Sig Start Date End Date Taking? Authorizing Provider  acetaminophen (TYLENOL) 500 MG tablet Take 500-1,000 mg by mouth every 6 (six) hours as needed for mild pain, headache or fever.    [provider]  acetaminophen (TYLENOL) 650 MG CR tablet Take 1,300 mg by mouth 2 (two) times daily as needed for pain (arthritis).    [provider]  amLODipine (NORVASC) 10 MG tablet Take 1 tablet (10 mg total) by mouth daily. 03/31/21   Nicholas Lose, MD  budesonide (ENTOCORT EC) 3 MG 24 hr capsule Take 9 mg by mouth daily. Patient not taking: Reported on 01/25/2022    [provider]  CALCIUM PO Take 1 tablet by mouth every morning.    [provider]  Cyanocobalamin (VITAMIN B-12 PO) Take 1  tablet by mouth every morning.    [provider]  ferrous sulfate 325 (65 FE) MG tablet Take 325 mg by mouth every morning.    [provider]  fluticasone (FLONASE) 50 MCG/ACT nasal spray Place 1 spray into both nostrils daily as needed for allergies or rhinitis.    [provider]  gabapentin (NEURONTIN) 300 MG capsule Take 1 capsule (300 mg total) by mouth at bedtime. 01/26/22   Nicholas Lose, MD  loratadine (CLARITIN) 10 MG tablet Take 10 mg by mouth daily as needed for allergies.    [provider]   losartan (COZAAR) 100 MG tablet Take 100 mg by mouth every morning. 02/26/21   [provider]  MAGNESIUM PO Take 1 tablet by mouth every morning.    [provider]  metoprolol succinate (TOPROL-XL) 50 MG 24 hr tablet Take 25 mg by mouth every morning. 02/26/21   [provider]  Potassium Chloride ER 20 MEQ TBCR Take 1 tablet by mouth daily. Patient not taking: Reported on 01/25/2022 01/13/22   [provider]  POTASSIUM PO Take 1 tablet by mouth every morning.    [provider]  traMADol (ULTRAM) 50 MG tablet Take 1 tablet (50 mg total) by mouth every 12 (twelve) hours as needed. 02/04/2022   Nicholas Lose, MD  Vitamin D, Ergocalciferol, (DRISDOL) 1.25 MG (50000 UNIT) CAPS capsule Take 50,000 Units by mouth every Tuesday. 08/19/20   [provider]  cetirizine (ZYRTEC ALLERGY) 10 MG tablet Take 1 tablet (10 mg total) by mouth daily. Patient not taking: Reported on 09/06/2020 05/29/17 09/06/20  Jaynee Eagles, PA-C  prochlorperazine (COMPAZINE) 10 MG tablet Take 1 tablet (10 mg total) by mouth every 6 (six) hours as needed (Nausea or vomiting). 10/01/20 05/27/21  Nicholas Lose, MD     Allergies:   No Known Allergies  Social History:   Social History   Socioeconomic History   Marital status: Divorced    Spouse name: Not on file   Number of children: Not on file   Years of education: Not on file   Highest education level: Not on file  Occupational History   Not on file  Tobacco Use   Smoking status: Former    Packs/day: 0.25    Types: Cigarettes   Smokeless tobacco: Never  Vaping Use   Vaping Use: Never used  Substance and Sexual Activity   Alcohol use: No   Drug use: No   Sexual activity: Not Currently    Birth control/protection: Post-menopausal  Other Topics Concern   Not on file  Social History Narrative   Not on file   Social Determinants of Health   Financial Resource Strain: Not on file  Food Insecurity: Not on file   Transportation Needs: Not on file  Physical Activity: Not on file  Stress: Not on file  Social Connections: Not on file  Intimate Partner Violence: Not on file    Family History:   The patient's family history includes Cancer in an other family member; Heart attack in her father; Lung cancer in her father.    ROS:  Please see the history of present illness.  All other ROS reviewed and negative.     Physical Exam/Data:   Vitals:   02/25/2022 1330 02/21/2022 1400 02/02/2022 1415 02/18/2022 1514  BP: (!) 88/64 (!) 75/37 90/78   Pulse: 80 78 79   Resp: (!) 23 (!) 26 (!) 24   Temp:      TempSrc:  SpO2: 90% 95% 95%   Weight:    92.1 kg  Height:    '5\' 6"'$  (1.676 m)    Intake/Output Summary (Last 24 hours) at 02/12/2022 1548 Last data filed at 02/25/2022 1123 Gross per 24 hour  Intake 250 ml  Output --  Net 250 ml      02/23/2022    3:14 PM 01/26/2022    2:42 PM 01/25/2022    9:36 AM  Last 3 Weights  Weight (lbs) 203 lb 203 lb 14.4 oz 200 lb  Weight (kg) 92.08 kg 92.488 kg 90.719 kg     Body mass index is 32.77 kg/m.  General:  Well nourished, well developed, in mild respiratory distress HEENT: normal Neck: Elevated JVD Vascular: No carotid bruits; Distal pulses 2+ bilaterally   Cardiac:  normal S1, S2; RRR; no murmur  Lungs:  clear to auscultation bilaterally, no wheezing, rhonchi or rales  Abd: soft, nontender, no hepatomegaly  Ext: no edema; she is mildly cool to touch Musculoskeletal:  No deformities, BUE and BLE strength normal and equal Skin: warm and dry  Neuro:  CNs 2-12 intact, no focal abnormalities noted Psych:  Normal affect    EKG:  The ECG that was done was personally reviewed and demonstrates sinus rhythm with an anterior infarction pattern  Relevant CV Studies: Bedside echocardiogram that I reviewed demonstrates significant RV and RA dilatation with severe RV dysfunction with underfilled left ventricle with apparently normal LV function  Laboratory  Data:  High Sensitivity Troponin:   Recent Labs  Lab 01/25/22 1024 01/25/22 1147 01/26/2022 1132 02/14/2022 1332  TROPONINIHS 353* 359* 710* 754*      Chemistry Recent Labs  Lab 01/25/22 1024 02/05/2022 1132  NA 140 139  K 3.9 4.7  CL 107 103  CO2 21* 20*  GLUCOSE 109* 87  BUN 23* 59*  CREATININE 1.13* 3.43*  CALCIUM 9.2 9.0  GFRNONAA 56* 15*  ANIONGAP 12 16*    Recent Labs  Lab 01/25/22 1024 02/19/2022 1132  PROT 7.5 6.8  ALBUMIN 3.8 3.5  AST 99* 181*  ALT 90* 153*  ALKPHOS 103 126  BILITOT 0.8 1.0   Lipids No results for input(s): "CHOL", "TRIG", "HDL", "LABVLDL", "LDLCALC", "CHOLHDL" in the last 168 hours. Hematology Recent Labs  Lab 01/25/22 1024 02/01/2022 1132  WBC 8.5 9.5  RBC 4.65 4.38  HGB 13.6 12.7  HCT 41.4 39.7  MCV 89.0 90.6  MCH 29.2 29.0  MCHC 32.9 32.0  RDW 16.2* 16.6*  PLT 117* 128*   Thyroid No results for input(s): "TSH", "FREET4" in the last 168 hours. BNP Recent Labs  Lab 02/05/2022 1132  BNP 750.4*    DDimer No results for input(s): "DDIMER" in the last 168 hours.   Radiology/Studies:  DG Chest Port 1 View  Result Date: 01/26/2022 CLINICAL DATA:  Shortness of breath history of breast cancer EXAM: PORTABLE CHEST 1 VIEW COMPARISON:  CT chest, 01/25/2022 FINDINGS: Cardiomegaly. Masslike consolidation of the peripheral left upper lobe. The visualized skeletal structures are unremarkable. IMPRESSION: 1. Masslike consolidation of the peripheral left upper lobe, as seen by prior CT. 2. Cardiomegaly. Electronically Signed   By: Delanna Ahmadi M.D.   On: 02/20/2022 14:11     Assessment and Plan:   Right-sided cardiogenic shock: The patient has developed interestingly isolated right ventricular shock based on her LFT abnormalities, rising creatinine, and echocardiogram.  I have discussed with the heart failure service and I believe that she requires a right heart catheterization, coronary angiography.  Norepinephrine will be continued for now.   She may require inotropes to support the right ventricle depending on her right heart cath hemodynamics.  At this point in time is unclear what the etiology of her RV dysfunction is.  She does not provide a history consistent with a fat embolism and her PE protocol CT was negative. History of breast cancer: She has a lesion in the T4 region that may suggest recurrence. Hypertension: Hold hypertensive agents given ongoing cardiogenic shock   Risk Assessment/Risk Scores:       New York Heart Association (NYHA) Functional Class NYHA Class IV     Severity of Illness: The appropriate patient status for this patient is INPATIENT. Inpatient status is judged to be reasonable and necessary in order to provide the required intensity of service to ensure the patient's safety. The patient's presenting symptoms, physical exam findings, and initial radiographic and laboratory data in the context of their chronic comorbidities is felt to place them at high risk for further clinical deterioration. Furthermore, it is not anticipated that the patient will be medically stable for discharge from the hospital within 2 midnights of admission.   * I certify that at the point of admission it is my clinical judgment that the patient will require inpatient hospital care spanning beyond 2 midnights from the point of admission due to high intensity of service, high risk for further deterioration and high frequency of surveillance required.*   For questions or updates, please contact [place] Please consult [email or web address] for contact info under     Signed, Early Osmond, MD  02/21/2022 3:48 PM

## 2022-01-27 NOTE — ED Notes (Signed)
Report given to ED Charge at [facility]

## 2022-01-27 NOTE — Significant Event (Addendum)
Rapid Response Event Note   Reason for Call :  Hypotensive, Increased WOB, increased O2 demands    Initial Focused Assessment:  Alert and Oriented x 4. Increased WOB noted on NRB  @ 10 liters of O2.  Patient reports that she had a bone scan this morning.    Interventions:  Transport to ED for evaluation    MD Notified: PA @ bedside.  Call Time: [place] Time: [place] Time: [place] Thecla Forgione, RN

## 2022-01-27 NOTE — ED Provider Notes (Signed)
Care assumed from Dr. Maryan Rued upon ED to ED transfer from [facility].  Briefly: Patient initially presented today newly hypoxic on oxygen with left sided chest pain. Just had negative CTA with troponin elevation at previous ER visit.  Plan: Patient found to be hypotensive and hypoxic. Received fluids at oncology which worsened condition. Placed on Levophed with some improvement in condition. Most recent Echo 1 year ago shows EF 60-65%. Concern for new onset heart failure with cardiogenic shock. Also has new AKI, concern for end organ damage. Dr. Ali Lowe cardiology recommends ER to ER transfer to expedite cardiology care.  Upon exam, patient is maintaining on 4 L oxygen with Levophed running. Cardiology met the patient at the door upon EMS arrival.  Discussed patients care with cardiology Dr. Ali Lowe who saw the patient, she will be admitted to CHF service and have Swan-Ganz catheter placement and CORS test. Patient is understanding and amenable with plan.  This is a shared visit with supervising physician Dr. Roslynn Amble who has independently evaluated patient & provided guidance in evaluation/management/disposition, in agreement with care     Bud Face, PA-C 02/08/2022 1610    Lucrezia Starch, MD 02/21/2022 [phone]

## 2022-01-27 NOTE — Progress Notes (Signed)
Late note- Patient was in respiratory distress.  Unable to maintain O2 sats, SHOB, and chest tightness.  Pt was tripoding and c/o head "fuzziness and heaviness"  VS unstable (see flowsheet), Anda Kraft PA at bedside. Order rec'd for 12-lead EKG- completed and given to [facility].  2nd IV line started.  Rapid response called and [employer] responded.  Pt transported to ED room 2 via stretcher on 15L NRB, dynamap for monitoring, fluids infusing to R AC.  Report given at bedside.  See MAR and flowsheet for meds given and frequent VS

## 2022-01-27 NOTE — Progress Notes (Signed)
ANTICOAGULATION CONSULT NOTE - Initial Consult  Pharmacy Consult for heparin Indication: chest pain/ACS  No Known Allergies  Patient Measurements: Height: '5\' 6"'$  (167.6 cm) Weight: 92.1 kg (203 lb) IBW/kg (Calculated) : 59.3 Heparin Dosing Weight: 79.5kg  Vital Signs: Temp: 98.3 F (36.8 C) (08/02 1303) Temp Source: Rectal (08/02 1303) BP: 108/96 (08/02 1600) Pulse Rate: 82 (08/02 1600)  Labs: Recent Labs    01/25/22 1024 01/25/22 1147 02/13/2022 1132 02/08/2022 1332  HGB 13.6  --  12.7  --   HCT 41.4  --  39.7  --   PLT 117*  --  128*  --   CREATININE 1.13*  --  3.43*  --   TROPONINIHS 353* 359* 710* 754*    Estimated Creatinine Clearance: 19.9 mL/min (A) (by C-G formula based on SCr of 3.43 mg/dL (H)).   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

## 2022-01-27 NOTE — ED Triage Notes (Signed)
Pt BIB EMS due to dizziness, hypotensive, and weakness for a week. Pt has hx of breast cancer. Pts O2 sats were 80 on RA. Levo on mcgs. Pt SHOB. Axox4.

## 2022-01-27 NOTE — Progress Notes (Signed)
RN paged reporting patient with hypotension, confusion, unable obtain CI via swan cath and coox 46%.. Reviewed chart. Patient is alert and oriented x3 upon encounter, aware she is in the hospital. Daughter states she was confused briefly prior and did not know where she was. MAP 55-61, SBP 65-70s. Pending arterial line insertion per RT. Reviewed with AHF team Dr Aundra Dubin, advised continue A line insertion, up-titrate NE, continue dobutamine, CTH when patient is stable. Please page back overnight if needed, sign out provided to fellow.

## 2022-01-27 NOTE — Consult Note (Addendum)
Advanced Heart Failure Team Consult Note   Primary Physician: Riki Sheer, NP PCP-Cardiologist:  None  Reason for Consultation: Shock, Suspect Cardiogenic   HPI:    Rita Cole is seen today for evaluation of suspected cardiogenic shock at the request of Dr. Maryan Rued, Emergency Medicine.   61 y/o AAF w/ h/o HTN, pre-diabetes and left sided breast cancer, diagnosed 4/22, treated w/ chemo + radiation, followed by mastectomy. Chemo regimen = Pembrolizumab + Carboplatin D1 + Paclitaxel .   Baseline echo obtained prior to initiation of chemo 4/22. This showed normal LVEF and RV.   3 month surveillance echo 7/22 showed EF 60-65% w/ LV global longitudinal strain -18.2 %, normal RV.   Completed tx. Had been in remission.   Seen by oncology for acute work in visit 7/27 for new onselt left chest wall pain, swelling, tenderness and ROM difficulty. Exam + for palpable supraclavicular lymph node. Chest CT was done showing new mediastinal, hilar, lower cervical, and right axillary lymphadenopathy, and new sclerotic  lesion of T4, concerning for disease recurrence/metastases. There was also enlargement of  right atrium and right ventricle, new since the prior exam in April 2022, c/w PAH but no acute PE.   Several days later developed worsening CP and dyspnea. Came to [facility] ED, found to be hypotensive. Hs trop 353>>359>>710. BNP 750. Developed AKI, SCr 1.13>>3.43. LFTs elevated: AST 99>>181, ALT 90>>53. Started on NE. AF. WBC normal 9.5. Hgb 12.7. Lactic acid 3.7. Co-ox checked but through PIV (not accurate).   Transferred to [facility] for suspected cardiogenic shock and [facility] consultation.   Echo pending   Review of Systems: [y] = yes, '[ ]'$  = no   General: Weight gain '[ ]'$ ; Weight loss '[ ]'$ ; Anorexia '[ ]'$ ; Fatigue '[ ]'$ ; Fever '[ ]'$ ; Chills '[ ]'$ ; Weakness '[ ]'$   Cardiac: Chest pain/pressure [ Y]; Resting SOB [ Y]; Exertional SOB [Y ]; Orthopnea '[ ]'$ ; Pedal Edema '[ ]'$ ; Palpitations '[ ]'$ ; Syncope [  ]; Presyncope [ Y]; Paroxysmal nocturnal dyspnea'[ ]'$   Pulmonary: Cough '[ ]'$ ; Wheezing'[ ]'$ ; Hemoptysis'[ ]'$ ; Sputum '[ ]'$ ; Snoring '[ ]'$   GI: Vomiting'[ ]'$ ; Dysphagia'[ ]'$ ; Melena'[ ]'$ ; Hematochezia '[ ]'$ ; Heartburn'[ ]'$ ; Abdominal pain '[ ]'$ ; Constipation '[ ]'$ ; Diarrhea '[ ]'$ ; BRBPR '[ ]'$   GU: Hematuria'[ ]'$ ; Dysuria '[ ]'$ ; Nocturia'[ ]'$   Vascular: Pain in legs with walking '[ ]'$ ; Pain in feet with lying flat '[ ]'$ ; Non-healing sores '[ ]'$ ; Stroke '[ ]'$ ; TIA '[ ]'$ ; Slurred speech '[ ]'$ ;  Neuro: Headaches'[ ]'$ ; Vertigo'[ ]'$ ; Seizures'[ ]'$ ; Paresthesias'[ ]'$ ;Blurred vision '[ ]'$ ; Diplopia '[ ]'$ ; Vision changes '[ ]'$   Ortho/Skin: Arthritis '[ ]'$ ; Joint pain '[ ]'$ ; Muscle pain '[ ]'$ ; Joint swelling '[ ]'$ ; Back Pain '[ ]'$ ; Rash '[ ]'$   Psych: Depression'[ ]'$ ; Anxiety'[ ]'$   Heme: Bleeding problems '[ ]'$ ; Clotting disorders '[ ]'$ ; Anemia '[ ]'$   Endocrine: Diabetes '[ ]'$ ; Thyroid dysfunction'[ ]'$   Home Medications Prior to Admission medications   Medication Sig Start Date End Date Taking? Authorizing Provider  acetaminophen (TYLENOL) 500 MG tablet Take 500-1,000 mg by mouth every 6 (six) hours as needed for mild pain, headache or fever.    [provider]  acetaminophen (TYLENOL) 650 MG CR tablet Take 1,300 mg by mouth 2 (two) times daily as needed for pain (arthritis).    [provider]  amLODipine (NORVASC) 10 MG tablet Take 1 tablet (10 mg total) by mouth daily. 03/31/21   Nicholas Lose, MD  budesonide (  ENTOCORT EC) 3 MG 24 hr capsule Take 9 mg by mouth daily. Patient not taking: Reported on 01/25/2022    [provider]  CALCIUM PO Take 1 tablet by mouth every morning.    [provider]  Cyanocobalamin (VITAMIN B-12 PO) Take 1 tablet by mouth every morning.    [provider]  ferrous sulfate 325 (65 FE) MG tablet Take 325 mg by mouth every morning.    [provider]  fluticasone (FLONASE) 50 MCG/ACT nasal spray Place 1 spray into both nostrils daily as needed for allergies or rhinitis.    [provider]  gabapentin  (NEURONTIN) 300 MG capsule Take 1 capsule (300 mg total) by mouth at bedtime. 01/26/22   Nicholas Lose, MD  loratadine (CLARITIN) 10 MG tablet Take 10 mg by mouth daily as needed for allergies.    [provider]  losartan (COZAAR) 100 MG tablet Take 100 mg by mouth every morning. 02/26/21   [provider]  MAGNESIUM PO Take 1 tablet by mouth every morning.    [provider]  metoprolol succinate (TOPROL-XL) 50 MG 24 hr tablet Take 25 mg by mouth every morning. 02/26/21   [provider]  Potassium Chloride ER 20 MEQ TBCR Take 1 tablet by mouth daily. Patient not taking: Reported on 01/25/2022 01/13/22   [provider]  POTASSIUM PO Take 1 tablet by mouth every morning.    [provider]  traMADol (ULTRAM) 50 MG tablet Take 1 tablet (50 mg total) by mouth every 12 (twelve) hours as needed. 02/16/2022   Nicholas Lose, MD  Vitamin D, Ergocalciferol, (DRISDOL) 1.25 MG (50000 UNIT) CAPS capsule Take 50,000 Units by mouth every Tuesday. 08/19/20   [provider]  cetirizine (ZYRTEC ALLERGY) 10 MG tablet Take 1 tablet (10 mg total) by mouth daily. Patient not taking: Reported on 09/06/2020 05/29/17 09/06/20  Jaynee Eagles, PA-C  prochlorperazine (COMPAZINE) 10 MG tablet Take 1 tablet (10 mg total) by mouth every 6 (six) hours as needed (Nausea or vomiting). 10/01/20 05/27/21  Nicholas Lose, MD    Past Medical History: Past Medical History:  Diagnosis Date   Breast cancer [facility])    left breast IDC   Crohn's colitis ([place])    Family history of lung cancer 10/02/2020   GERD (gastroesophageal reflux disease)    Hypertension    Pre-diabetes     Past Surgical History: Past Surgical History:  Procedure Laterality Date   BREAST SURGERY     BUNIONECTOMY     FLEXIBLE SIGMOIDOSCOPY N/A 07/10/2021   Procedure: FLEXIBLE SIGMOIDOSCOPY;  Surgeon: Arta Silence, MD;  Location: [facility];  Service: Endoscopy;  Laterality: N/A;   IR IMAGING GUIDED PORT  INSERTION  10/14/2020   MODIFIED MASTECTOMY Left 05/14/2021   Procedure: LEFT MODIFIED RADICAL MASTECTOMY;  Surgeon: Rolm Bookbinder, MD;  Location: [place];  Service: General;  Laterality: Left;   MYOMECTOMY     PORT-A-CATH REMOVAL Right 09/14/2021   Procedure: REMOVAL PORT-A-CATH;  Surgeon: Rolm Bookbinder, MD;  Location: [place];  Service: General;  Laterality: Right;   TONSILLECTOMY     as a child    Family History: Family History  Problem Relation Age of Onset   Heart attack Father    Lung cancer Father        dx unknown age   Cancer Other        MGM's mother; unknown type; dx unknown age    Social History: Social History  Socioeconomic History   Marital status: Divorced    Spouse name: Not on file   Number of children: Not on file   Years of education: Not on file   Highest education level: Not on file  Occupational History   Not on file  Tobacco Use   Smoking status: Former    Packs/day: 0.25    Types: Cigarettes   Smokeless tobacco: Never  Vaping Use   Vaping Use: Never used  Substance and Sexual Activity   Alcohol use: No   Drug use: No   Sexual activity: Not Currently    Birth control/protection: Post-menopausal  Other Topics Concern   Not on file  Social History Narrative   Not on file   Social Determinants of Health   Financial Resource Strain: Not on file  Food Insecurity: Not on file  Transportation Needs: Not on file  Physical Activity: Not on file  Stress: Not on file  Social Connections: Not on file    Allergies:  No Known Allergies  Objective:    Vital Signs:   Temp:  [97.8 F (36.6 C)-98.6 F (37 C)] 98.3 F (36.8 C) (08/02 1303) Pulse Rate:  [74-86] 79 (08/02 1415) Resp:  [20-29] 24 (08/02 1415) BP: (61-96)/(37-78) 90/78 (08/02 1415) SpO2:  [86 %-100 %] 95 % (08/02 1415)    Weight change: There were no vitals filed for this visit.  Intake/Output:   Intake/Output Summary (Last 24 hours) at 01/26/2022  1511 Last data filed at 02/21/2022 1123 Gross per 24 hour  Intake 250 ml  Output --  Net 250 ml      Physical Exam    General:  fatigued appearing. No resp difficulty HEENT: normal Neck: supple. Distended neck veins. JVP elevated to jaw . Carotids 2+ bilat; no bruits. No lymphadenopathy or thyromegaly appreciated. Cor: PMI nondisplaced. Regular rate & rhythm. No rubs, gallops or murmurs. Lungs: clear Abdomen: soft, nontender, nondistended. No hepatosplenomegaly. No bruits or masses. Good bowel sounds. Extremities: no cyanosis, clubbing, rash, edema Neuro: alert & orientedx3, cranial nerves grossly intact. moves all 4 extremities w/o difficulty. Affect pleasant   Telemetry   NSR 80s   EKG    NSR 86 bpm ? Anterior infarct  Labs   Basic Metabolic Panel: Recent Labs  Lab 01/25/22 1024 02/17/2022 1132  NA 140 139  K 3.9 4.7  CL 107 103  CO2 21* 20*  GLUCOSE 109* 87  BUN 23* 59*  CREATININE 1.13* 3.43*  CALCIUM 9.2 9.0    Liver Function Tests: Recent Labs  Lab 01/25/22 1024 02/13/2022 1132  AST 99* 181*  ALT 90* 153*  ALKPHOS 103 126  BILITOT 0.8 1.0  PROT 7.5 6.8  ALBUMIN 3.8 3.5   No results for input(s): "LIPASE", "AMYLASE" in the last 168 hours. No results for input(s): "AMMONIA" in the last 168 hours.  CBC: Recent Labs  Lab 01/25/22 1024 02/22/2022 1132  WBC 8.5 9.5  NEUTROABS  --  7.7  HGB 13.6 12.7  HCT 41.4 39.7  MCV 89.0 90.6  PLT 117* 128*    Cardiac Enzymes: No results for input(s): "CKTOTAL", "CKMB", "CKMBINDEX", "TROPONINI" in the last 168 hours.  BNP: BNP (last 3 results) Recent Labs    02/17/2022 1132  BNP 750.4*    ProBNP (last 3 results) No results for input(s): "PROBNP" in the last 8760 hours.   CBG: Recent Labs  Lab 01/28/2022 1126  GLUCAP 81    Coagulation Studies: No results for input(s): "LABPROT", "INR" in the  last 72 hours.   Imaging   DG Chest Port 1 View  Result Date: 02/18/2022 CLINICAL DATA:  Shortness of  breath history of breast cancer EXAM: PORTABLE CHEST 1 VIEW COMPARISON:  CT chest, 01/25/2022 FINDINGS: Cardiomegaly. Masslike consolidation of the peripheral left upper lobe. The visualized skeletal structures are unremarkable. IMPRESSION: 1. Masslike consolidation of the peripheral left upper lobe, as seen by prior CT. 2. Cardiomegaly. Electronically Signed   By: Delanna Ahmadi M.D.   On: 01/29/2022 14:11     Medications:     Current Medications:   Infusions:  norepinephrine (LEVOPHED) Adult infusion 4 mcg/min (02/10/2022 1430)      Patient Profile   61 y/o AAF w/ HTN, pre-diabetes and left sided breast cancer treated w/ radiation, chemo and mascetomy w/ recent CT concerning for recurrence, presenting to ED w/ chest pain, hypotension and shock.   Assessment/Plan   Shock, Suspect Cardiogenic  -  Lactic acid 3.7, Hs trop 754. BNP  750  - Obtain STAT echo to assess cardiac function  - Etiology of CS uncertain. ? ACS vs chemo induced vs stress CM  ? Chemo induced CM. Per literature review, her prior chemo agents cary low risk for cardiovascular complications, 3-6% risk of MI, ischemic heart disease and myocarditis  - ? Stress induced CM. Received recent news of likely cancer recurrence  - Continue NE for BP support - Needs central access for co-oximetry and CVP monitoring  - Diurese w/ IV Lasix  - Heparin gtt for NSTEMi. Not candidate for LHC currently given AKI w/ SCr > 3. Medical management for now  - follow lactate for clearance  - may need RHC and placement of PA catheter to help guide treatment (chest CT w/ enlarged RA and RV)  2. NSTEMI  - + CP  - Hs trop 359>>710>>754 - medical management for now in setting of AKI  - heparin gtt - ASA 81 - no ? blocker w/ shock  - hold statin initiation w/ elevated LFTs  3. AKI - SCr 1.13>>3.43 - in setting of shock/hypotension  - support BP w/ NE   4. Transaminitis  - AST 99>>181 - ALT 9->>153 - 2/2 shock/ low output - follow  CMP   5. Left Sided Breast Cancer w/ Likely Recurrence  - diagnosed 4/22, treated w/ radiation, chemo + mastectomy  - Chest CT 7/27 w/ findings c/w recurrence  - Previous chemo regimen = Pembrolizumab + Carboplatin D1 + Paclitaxel  - pretreatment echo 4/22 LVEF and RV normal  - surveillance echo 7/22 EF 60-65%, LV global longitudinal strain -18.2 %. RV normal  - obtain STAT echo   6. Enlarged RA and RV on Chest CT  - new compared to prior study 4/22 - findings suggest PAH. Only trace pericardial effusion noted  - recent chest CT negative for PE  - RHC today   Length of Stay: 0  Lyda Jester, PA-C  02/02/2022, 3:11 PM  Advanced Heart Failure Team Pager [phone] (M-F; 7a - 5p)  Please contact [facility] for night-coverage after hours (4p -7a ) and weekends on [email or web address]  Patient seen with PA, agree with the above note.    History as described above.  About 1 week of profound fatigue with progressive exertional dyspnea, admitted today with hypoxemia and hypotension as above, now on norepinephrine 4.    RHC was done today and Swan placed in IJ: Hemodynamics (mmHg) RA mean 19 RV 53/19 PA 47/22, mean 32 PCWP mean 12 Oxygen saturations: PA  47% AO 97% Cardiac Output (Fick) 3.1  Cardiac Index (Fick) 1.54 PVR 6.45 WU PAPi 1.3  Echo: EF 55-60%, small compressed LV, D-shaped septum, severe RV dilation with severely decreased systolic function, IVC dilated.   General: NAD Neck: JVP 16+, no thyromegaly or thyroid nodule.  Lungs: Clear to auscultation bilaterally with normal respiratory effort. CV: Nondisplaced PMI.  RV heave.  Heart regular S1/S2, no S3/S4, no murmur.  1+ ankle edema.  No carotid bruit.  Normal pedal pulses.  Abdomen: Soft, nontender, no hepatosplenomegaly, no distention.  Skin: Intact without lesions or rashes.  Neurologic: Alert and oriented x 3.  Psych: Normal affect. Extremities: No clubbing or cyanosis.  HEENT: Normal.   1. Shock: Suspect  cardiogenic due to RV failure.  Echo showed EF 55-60%, small compressed LV, D-shaped septum, severe RV dilation with severely decreased systolic function, IVC dilated.  Most recent previous echo in 7/22 was normal. RHC showed elevated RA pressure/RV failure with low PAPi and CI but normal PCWP.  There was moderate PAH with PVR 6.45.  Cause of RV failure is uncertain. CTA chest 7/31 did not show evidence for acute pulmonary embolus.  Elevated PVR suggests that there could be a pulmonary vascular issue causing the RV failure, but prior echo in 7/22 did not show signs of this.  Cannot fully rule out chronic PE without V/Q scan, would also consider pulmonary veno-occlusive disease in the setting of prior chemotherapy.  Also consider some form of tumor thrombus syndrome.  I do not think this is cardio-toxicity from prior chemo (seems isolated to RV and pulmonary vasculature, and Pembrolizumab can lead to myocarditis but last treatment was >6 months ago).  She is currently on Norepinephrine at 4.  - Add dobutamine 5 mcg/kg/min for RV support with low CI.   - Continue NE as needed to maintain MAP, will need arterial line.  - Once dobutamine is running, will challenge with Lasix 80 mg IV x 1 to assess response.  Ultimately, need significant volume off to decrease RV size and thus decompress LV and improve hemodynamics.  - I do not think she would be a good candidate for RV support device (Protek Duo or Impella RP).  She has elevated PVR suggesting pulmonary vasoconstriction that may respond poorly to increased flow into the PA, and we do not have a great end point here with worry for metastatic breast cancer and possible PVOD.  - Reasonable to anticoagulate given concern for possible chronic PEs or tumor thormbus syndrome, but need CT head w/o contrast to rule out CNS hemorrhage first given severe headache for several days now.  2. Elevated troponin: Suspect demand ischemia from shock and RV failure.  Mild elevation  without significant trend.  3. Headache: Severe at times, constant for several days.   - Need CT head w/o contrast to rule out CNS hemorrhage or CNS mets.  4. AKI: Creatinine up to 3.5 from 1.13 in setting of shock.  Hopefully will improve with hemodynamic support.  5. Elevated LFTs: New, suspect shock liver.  6. Breast cancer: On left, diagnosed in 2022, had left mastectomy, chemotherapy, and XRT.  She had carboplatin, pembrolizumab, and paclitaxel.  CT chest in 7/23 was concerning for metastatic disease with T4 lesion and extensive lymphadenopathy.  - Will need to get an idea about prognosis from oncology.   Loralie Champagne 02/13/2022

## 2022-01-28 ENCOUNTER — Ambulatory Visit: Payer: Federal, State, Local not specified - PPO

## 2022-01-28 ENCOUNTER — Inpatient Hospital Stay (HOSPITAL_COMMUNITY): Payer: Federal, State, Local not specified - PPO

## 2022-01-28 ENCOUNTER — Encounter (HOSPITAL_COMMUNITY): Payer: Self-pay | Admitting: Cardiology

## 2022-01-28 DIAGNOSIS — R570 Cardiogenic shock: Secondary | ICD-10-CM | POA: Diagnosis not present

## 2022-01-28 DIAGNOSIS — I509 Heart failure, unspecified: Secondary | ICD-10-CM | POA: Diagnosis not present

## 2022-01-28 DIAGNOSIS — J9601 Acute respiratory failure with hypoxia: Secondary | ICD-10-CM

## 2022-01-28 DIAGNOSIS — N179 Acute kidney failure, unspecified: Secondary | ICD-10-CM | POA: Diagnosis not present

## 2022-01-28 LAB — POCT I-STAT EG7
Acid-base deficit: 6 mmol/L — ABNORMAL HIGH (ref 0.0–2.0)
Acid-base deficit: 6 mmol/L — ABNORMAL HIGH (ref 0.0–2.0)
Bicarbonate: 19.1 mmol/L — ABNORMAL LOW (ref 20.0–28.0)
Bicarbonate: 19.3 mmol/L — ABNORMAL LOW (ref 20.0–28.0)
Calcium, Ion: 1.11 mmol/L — ABNORMAL LOW (ref 1.15–1.40)
Calcium, Ion: 1.15 mmol/L (ref 1.15–1.40)
HCT: 38 % (ref 36.0–46.0)
HCT: 39 % (ref 36.0–46.0)
Hemoglobin: 12.9 g/dL (ref 12.0–15.0)
Hemoglobin: 13.3 g/dL (ref 12.0–15.0)
O2 Saturation: 45 %
O2 Saturation: 47 %
Potassium: 4.8 mmol/L (ref 3.5–5.1)
Potassium: 4.8 mmol/L (ref 3.5–5.1)
Sodium: 138 mmol/L (ref 135–145)
Sodium: 138 mmol/L (ref 135–145)
TCO2: 20 mmol/L — ABNORMAL LOW (ref 22–32)
TCO2: 20 mmol/L — ABNORMAL LOW (ref 22–32)
pCO2, Ven: 36.4 mmHg — ABNORMAL LOW (ref 44–60)
pCO2, Ven: 36.7 mmHg — ABNORMAL LOW (ref 44–60)
pH, Ven: 7.324 (ref 7.25–7.43)
pH, Ven: 7.333 (ref 7.25–7.43)
pO2, Ven: 27 mmHg — CL (ref 32–45)
pO2, Ven: 27 mmHg — CL (ref 32–45)

## 2022-01-28 LAB — MAGNESIUM: Magnesium: 2.5 mg/dL — ABNORMAL HIGH (ref 1.7–2.4)

## 2022-01-28 LAB — CBC
HCT: 36.6 % (ref 36.0–46.0)
Hemoglobin: 12.3 g/dL (ref 12.0–15.0)
MCH: 29.6 pg (ref 26.0–34.0)
MCHC: 33.6 g/dL (ref 30.0–36.0)
MCV: 88 fL (ref 80.0–100.0)
Platelets: 103 10*3/uL — ABNORMAL LOW (ref 150–400)
RBC: 4.16 MIL/uL (ref 3.87–5.11)
RDW: 16.5 % — ABNORMAL HIGH (ref 11.5–15.5)
WBC: 11.2 10*3/uL — ABNORMAL HIGH (ref 4.0–10.5)
nRBC: 0.4 % — ABNORMAL HIGH (ref 0.0–0.2)

## 2022-01-28 LAB — COMPREHENSIVE METABOLIC PANEL
ALT: 349 U/L — ABNORMAL HIGH (ref 0–44)
AST: 514 U/L — ABNORMAL HIGH (ref 15–41)
Albumin: 3.4 g/dL — ABNORMAL LOW (ref 3.5–5.0)
Alkaline Phosphatase: 132 U/L — ABNORMAL HIGH (ref 38–126)
Anion gap: 18 — ABNORMAL HIGH (ref 5–15)
BUN: 70 mg/dL — ABNORMAL HIGH (ref 6–20)
CO2: 21 mmol/L — ABNORMAL LOW (ref 22–32)
Calcium: 8.8 mg/dL — ABNORMAL LOW (ref 8.9–10.3)
Chloride: 100 mmol/L (ref 98–111)
Creatinine, Ser: 3.22 mg/dL — ABNORMAL HIGH (ref 0.44–1.00)
GFR, Estimated: 16 mL/min — ABNORMAL LOW (ref >60)
Glucose, Bld: 157 mg/dL — ABNORMAL HIGH (ref 70–99)
Potassium: 3.8 mmol/L (ref 3.5–5.1)
Sodium: 139 mmol/L (ref 135–145)
Total Bilirubin: 1.4 mg/dL — ABNORMAL HIGH (ref 0.3–1.2)
Total Protein: 6.5 g/dL (ref 6.5–8.1)

## 2022-01-28 LAB — COOXEMETRY PANEL
Carboxyhemoglobin: 0.8 % (ref 0.5–1.5)
Methemoglobin: <0.7 % (ref 0.0–1.5)
O2 Saturation: 54.7 %
Total hemoglobin: 12.6 g/dL (ref 12.0–16.0)

## 2022-01-28 LAB — HEPARIN LEVEL (UNFRACTIONATED): Heparin Unfractionated: 0.59 IU/mL (ref 0.30–0.70)

## 2022-01-28 LAB — LACTIC ACID, PLASMA
Lactic Acid, Venous: 4.2 mmol/L (ref 0.5–1.9)
Lactic Acid, Venous: 3.2 mmol/L (ref 0.5–1.9)

## 2022-01-28 MED ORDER — FUROSEMIDE 10 MG/ML IJ SOLN
12.0000 mg/h | INTRAMUSCULAR | Status: DC
  Administered 2022-01-28 (×2): 10 mg/h via INTRAVENOUS
  Administered 2022-01-29 – 2022-02-02 (×7): 12 mg/h via INTRAVENOUS
  Filled 2022-01-28 (×11): qty 20

## 2022-01-28 MED ORDER — SODIUM CHLORIDE 0.9% FLUSH: 10.0000 mL | INTRAVENOUS | Status: DC | PRN

## 2022-01-28 MED ORDER — CHLORHEXIDINE GLUCONATE CLOTH 2 % EX PADS
6.0000 | MEDICATED_PAD | Freq: Every day | CUTANEOUS | Status: DC
  Administered 2022-01-31: 6 via TOPICAL

## 2022-01-28 MED ORDER — HEPARIN BOLUS VIA INFUSION
2000.0000 [IU] | Freq: Once | INTRAVENOUS | Status: AC
Start: 2022-01-28 — End: 2022-01-28
  Administered 2022-01-28: 2000 [IU] via INTRAVENOUS
  Filled 2022-01-28: qty 2000

## 2022-01-28 MED ORDER — NOREPINEPHRINE 16 MG/250ML-% IV SOLN
0.0000 ug/min | INTRAVENOUS | Status: DC
  Administered 2022-01-28: 26 ug/min via INTRAVENOUS
  Administered 2022-01-28: 4 ug/min via INTRAVENOUS
  Administered 2022-01-28: 30 ug/min via INTRAVENOUS
  Administered 2022-01-29: 17 ug/min via INTRAVENOUS
  Administered 2022-01-30: 5 ug/min via INTRAVENOUS
  Administered 2022-02-01: 10 ug/min via INTRAVENOUS
  Administered 2022-02-02: 33 ug/min via INTRAVENOUS
  Administered 2022-02-02: 12 ug/min via INTRAVENOUS
  Administered 2022-02-02: 27 ug/min via INTRAVENOUS
  Filled 2022-01-28 (×8): qty 250

## 2022-01-28 MED ORDER — HEPARIN (PORCINE) 25000 UT/250ML-% IV SOLN
1050.0000 [IU]/h | INTRAVENOUS | Status: DC
  Administered 2022-01-28 – 2022-01-29 (×2): 1200 [IU]/h via INTRAVENOUS
  Filled 2022-01-28 (×2): qty 250

## 2022-01-28 MED ORDER — POTASSIUM CHLORIDE CRYS ER 20 MEQ PO TBCR
20.0000 meq | EXTENDED_RELEASE_TABLET | Freq: Once | ORAL | Status: AC
Start: 2022-01-28 — End: 2022-01-28
  Administered 2022-01-28: 20 meq via ORAL
  Filled 2022-01-28: qty 1

## 2022-01-28 MED ORDER — OXYCODONE HCL 5 MG PO TABS
5.0000 mg | ORAL_TABLET | Freq: Once | ORAL | Status: AC
  Administered 2022-01-28: 5 mg via ORAL
  Filled 2022-01-28: qty 1

## 2022-01-28 MED ORDER — FUROSEMIDE 10 MG/ML IJ SOLN
80.0000 mg | Freq: Once | INTRAMUSCULAR | Status: AC
  Administered 2022-01-28: 80 mg via INTRAVENOUS

## 2022-01-28 MED ORDER — CHLORHEXIDINE GLUCONATE CLOTH 2 % EX PADS
6.0000 | MEDICATED_PAD | Freq: Every day | CUTANEOUS | Status: DC
Start: 2022-01-28 — End: 2022-02-03
  Administered 2022-01-28 – 2022-02-02 (×6): 6 via TOPICAL

## 2022-01-28 MED ORDER — SODIUM CHLORIDE 0.9% FLUSH
10.0000 mL | Freq: Two times a day (BID) | INTRAVENOUS | Status: DC
  Administered 2022-01-28 – 2022-02-02 (×9): 10 mL

## 2022-01-28 MED ORDER — FENTANYL CITRATE PF 50 MCG/ML IJ SOSY
25.0000 ug | PREFILLED_SYRINGE | Freq: Four times a day (QID) | INTRAMUSCULAR | Status: DC | PRN
  Administered 2022-01-28 – 2022-01-29 (×2): 25 ug via INTRAVENOUS
  Filled 2022-01-28 (×2): qty 1

## 2022-01-28 MED FILL — Heparin Sod (Porcine)-NaCl IV Soln 1000 Unit/500ML-0.9%: INTRAVENOUS | Qty: 500 | Status: AC

## 2022-01-28 NOTE — Progress Notes (Signed)
Date and time results received: 01/28/22 1147  Test: Lactic Critical Value: 3.2  Name of Provider Notified: Dr. Tacy Learn  Orders Received? Or Actions Taken?:  No new orders.

## 2022-01-28 NOTE — Progress Notes (Addendum)
Patient ID: Rita Cole, female   DOB: 22-May-1961, 61 y.o.   MRN: [id number]     Advanced Heart Failure Rounding Note  PCP-Cardiologist: None   Subjective:    This morning, patient is on dobutamine 5 + NE 23 with MAP 65. CVP 14.  Creatinine 4.22 => 3.22. Lactate still 4.2. UOP 910 cc.   Still with mild headache.  Head CT normal.   Swan:  RA 14 PA 50/35 CI 2.4 Co-ox 55%  RHC (8/2): Hemodynamics (mmHg) RA mean 19 RV 53/19 PA 47/22, mean 32 PCWP mean 12 Oxygen saturations: PA 47% AO 97% Cardiac Output (Fick) 3.1  Cardiac Index (Fick) 1.54 PVR 6.45 WU PAPi 1.3   Echo: EF 55-60%, small compressed LV, D-shaped septum, severe RV dilation with severely decreased systolic function, IVC dilated.   Objective:   Weight Range: 94.1 kg Body mass index is 33.48 kg/m.   Vital Signs:   Temp:  [97.8 F (36.6 C)-99 F (37.2 C)] 98.2 F (36.8 C) (08/03 0645) Pulse Rate:  [73-108] 104 (08/03 0645) Resp:  [7-31] 18 (08/03 0645) BP: (39-152)/(21-107) 109/67 (08/03 0000) SpO2:  [85 %-100 %] 96 % (08/03 0645) Arterial Line BP: (79-123)/(47-68) 92/54 (08/03 0645) Weight:  [92.1 kg-94.1 kg] 94.1 kg (08/03 0700) Last BM Date : 02/16/2022  Weight change: Filed Weights   02/12/2022 1514 01/28/22 0700  Weight: 92.1 kg 94.1 kg    Intake/Output:   Intake/Output Summary (Last 24 hours) at 01/28/2022 0722 Last data filed at 01/28/2022 0700 Gross per 24 hour  Intake 1437.06 ml  Output 910 ml  Net 527.06 ml      Physical Exam    General:  Well appearing. No resp difficulty HEENT: Normal Neck: Supple. JVP 14-16 cm. Carotids 2+ bilat; no bruits. No lymphadenopathy or thyromegaly appreciated. Cor: PMI nondisplaced. Regular rate & rhythm. No rubs, gallops or murmurs. Lungs: Clear Abdomen: Soft, nontender, nondistended. No hepatosplenomegaly. No bruits or masses. Good bowel sounds. Extremities: No cyanosis, clubbing, rash. 1+ ankle edema.  Neuro: Alert & orientedx3, cranial  nerves grossly intact. moves all 4 extremities w/o difficulty. Affect pleasant   Telemetry   NSR 100s (personally reviewed)   Labs    CBC Recent Labs    01/26/2022 1132 02/09/2022 1730 02/06/2022 1920 02/04/2022 2042 01/28/22 0255  WBC 9.5  --  9.8  --  11.2*  NEUTROABS 7.7  --   --   --   --   HGB 12.7   < > 12.2 12.6 12.3  HCT 39.7   < > 37.9 37.0 36.6  MCV 90.6  --  89.6  --  88.0  PLT 128*  --  106*  --  103*   < > = values in this interval not displayed.   Basic Metabolic Panel Recent Labs    02/21/2022 1920 01/29/2022 2042 01/28/22 0255  NA 137 137 139  K 4.9 4.7 3.8  CL 103  --  100  CO2 16*  --  21*  GLUCOSE 102*  --  157*  BUN 68*  --  70*  CREATININE 4.22*  --  3.22*  CALCIUM 8.7*  --  8.8*  MG 2.7*  --  2.5*   Liver Function Tests Recent Labs    02/05/2022 1132 01/28/22 0255  AST 181* 514*  ALT 153* 349*  ALKPHOS 126 132*  BILITOT 1.0 1.4*  PROT 6.8 6.5  ALBUMIN 3.5 3.4*   No results for input(s): "LIPASE", "AMYLASE" in the last 72 hours. Cardiac  Enzymes No results for input(s): "CKTOTAL", "CKMB", "CKMBINDEX", "TROPONINI" in the last 72 hours.  BNP: BNP (last 3 results) Recent Labs    02/09/2022 1132  BNP 750.4*    ProBNP (last 3 results) No results for input(s): "PROBNP" in the last 8760 hours.   D-Dimer No results for input(s): "DDIMER" in the last 72 hours. Hemoglobin A1C No results for input(s): "HGBA1C" in the last 72 hours. Fasting Lipid Panel No results for input(s): "CHOL", "HDL", "LDLCALC", "TRIG", "CHOLHDL", "LDLDIRECT" in the last 72 hours. Thyroid Function Tests No results for input(s): "TSH", "T4TOTAL", "T3FREE", "THYROIDAB" in the last 72 hours.  Invalid input(s): "FREET3"  Other results:   Imaging    CT HEAD WO CONTRAST (5MM)  Result Date: 01/28/2022 CLINICAL DATA:  Subarachnoid hemorrhage EXAM: CT HEAD WITHOUT CONTRAST TECHNIQUE: Contiguous axial images were obtained from the base of the skull through the vertex  without intravenous contrast. RADIATION DOSE REDUCTION: This exam was performed according to the departmental dose-optimization program which includes automated exposure control, adjustment of the mA and/or kV according to patient size and/or use of iterative reconstruction technique. COMPARISON:  None Available. FINDINGS: Brain: There is no mass, hemorrhage or extra-axial collection. The size and configuration of the ventricles and extra-axial CSF spaces are normal. The brain parenchyma is normal, without acute or chronic infarction. Vascular: No abnormal hyperdensity of the major intracranial arteries or dural venous sinuses. No intracranial atherosclerosis. Skull: The visualized skull base, calvarium and extracranial soft tissues are normal. Sinuses/Orbits: No fluid levels or advanced mucosal thickening of the visualized paranasal sinuses. No mastoid or middle ear effusion. The orbits are normal. IMPRESSION: Normal head CT. Electronically Signed   By: Ulyses Jarred M.D.   On: 01/28/2022 01:39   DG CHEST PORT 1 VIEW  Result Date: 02/06/2022 CLINICAL DATA:  Pulmonary edema EXAM: PORTABLE CHEST 1 VIEW COMPARISON:  Chest x-ray 02/05/2022.  Chest CT 01/25/2022. FINDINGS: Masslike consolidation in the peripheral left upper lobe is unchanged from prior CT. There is no new focal lung infiltrate, pleural effusion or pneumothorax. Prominence of the left hilum is unchanged. Cardiomediastinal silhouette is stable, the heart is enlarged. No pleural effusion or pneumothorax. Swan-Ganz catheter is new with distal tip projecting over the right main pulmonary artery. Left axillary surgical clips are present. No acute fractures are seen. IMPRESSION: 1. No evidence for pulmonary edema. 2. Swan-Ganz catheter tip projects over the right main pulmonary artery. 3. Stable focal lung consolidation in the left upper lobe. Electronically Signed   By: Ronney Asters M.D.   On: 02/17/2022 19:23   CARDIAC CATHETERIZATION  Result Date:  02/22/2022 1. Elevated right heart filling pressures with normal PCWP. 2. Predominantly RV failure with low PAPi and low cardiac index 1.54. 3. Moderate pulmonary arterial hypertension with PVR 6.45 WU   ECHOCARDIOGRAM COMPLETE  Result Date: 02/08/2022    ECHOCARDIOGRAM REPORT   Patient Name:   Rita Cole [facility] Date of Exam: 01/30/2022 Medical Rec #:  [id number]              Height:       66.0 in Accession #:    [id number]             Weight:       203.0 lb Date of Birth:  July 13, 1960              BSA:          2.012 m Patient Age:    45 years  BP:           85/58 mmHg Patient Gender: F                      HR:           85 bpm. Exam Location:  Inpatient Procedure: 2D Echo, Color Doppler and Cardiac Doppler Indications:    Cardiogenic Shock  History:        Patient has prior history of Echocardiogram examinations, most                 recent 01/16/2021. Risk Factors:Hypertension.  Sonographer:    Raquel Sarna Senior RDCS Referring Phys: [phone] [place]  Sonographer Comments: Very difficult apical window due to radical mastectomy. IMPRESSIONS  1. The left ventricle is compressed by the right ventricle. Left ventricular ejection fraction, by estimation, is 55 to 60%. The left ventricle has normal function. The left ventricle has no regional wall motion abnormalities. There is mild left ventricular hypertrophy. Left ventricular diastolic parameters are consistent with Grade I diastolic dysfunction (impaired relaxation).  2. Markedly D-shaped interventricular septum suggests RV volume/pressure overload. Right ventricular systolic function is severely reduced. The right ventricular size is severely enlarged. There is mildly elevated pulmonary artery systolic pressure. The  estimated right ventricular systolic pressure is 34.7 mmHg.  3. Right atrial size was moderately dilated.  4. The mitral valve is normal in structure. No evidence of mitral valve regurgitation. No evidence of mitral stenosis.  5. Tricuspid  valve regurgitation is mild to moderate.  6. The aortic valve is tricuspid. There is mild calcification of the aortic valve. Aortic valve regurgitation is not visualized. No aortic stenosis is present.  7. The inferior vena cava is dilated in size with <50% respiratory variability, suggesting right atrial pressure of 15 mmHg. FINDINGS  Left Ventricle: The left ventricle is compressed by the right ventricle. Left ventricular ejection fraction, by estimation, is 55 to 60%. The left ventricle has normal function. The left ventricle has no regional wall motion abnormalities. The left ventricular internal cavity size was normal in size. There is mild left ventricular hypertrophy. Left ventricular diastolic parameters are consistent with Grade I diastolic dysfunction (impaired relaxation). Right Ventricle: Markedly D-shaped interventricular septum suggests RV volume/pressure overload. The right ventricular size is severely enlarged. No increase in right ventricular wall thickness. Right ventricular systolic function is severely reduced. There is mildly elevated pulmonary artery systolic pressure. The tricuspid regurgitant velocity is 2.65 m/s, and with an assumed right atrial pressure of 15 mmHg, the estimated right ventricular systolic pressure is 42.5 mmHg. Left Atrium: Left atrial size was normal in size. Right Atrium: Right atrial size was moderately dilated. Pericardium: Trivial pericardial effusion is present. Mitral Valve: The mitral valve is normal in structure. There is mild calcification of the mitral valve leaflet(s). Mild mitral annular calcification. No evidence of mitral valve regurgitation. No evidence of mitral valve stenosis. Tricuspid Valve: The tricuspid valve is normal in structure. Tricuspid valve regurgitation is mild to moderate. Aortic Valve: The aortic valve is tricuspid. There is mild calcification of the aortic valve. Aortic valve regurgitation is not visualized. No aortic stenosis is present.  Pulmonic Valve: The pulmonic valve was normal in structure. Pulmonic valve regurgitation is not visualized. Aorta: The aortic root is normal in size and structure. Venous: The inferior vena cava is dilated in size with less than 50% respiratory variability, suggesting right atrial pressure of 15 mmHg. IAS/Shunts: No atrial level shunt detected by color  flow Doppler.  LEFT VENTRICLE PLAX 2D LVIDd:         2.90 cm   Diastology LVIDs:         2.30 cm   LV e' medial:    3.81 cm/s LV PW:         1.50 cm   LV E/e' medial:  6.6 LV IVS:        1.20 cm   LV e' lateral:   3.81 cm/s LVOT diam:     2.10 cm   LV E/e' lateral: 6.6 LV SV:         20 LV SV Index:   10 LVOT Area:     3.46 cm  RIGHT VENTRICLE RV S prime:     4.90 cm/s TAPSE (M-mode): 0.7 cm LEFT ATRIUM           Index       RIGHT ATRIUM           Index LA diam:      2.90 cm 1.44 cm/m  RA Area:     20.90 cm LA Vol (A4C): 14.1 ml 7.01 ml/m  RA Volume:   67.30 ml  33.44 ml/m  AORTIC VALVE LVOT Vmax:   40.60 cm/s LVOT Vmean:  28.200 cm/s LVOT VTI:    0.057 m  AORTA Ao Root diam: 3.20 cm Ao Asc diam:  2.80 cm MITRAL VALVE               TRICUSPID VALVE MV Area (PHT): 2.56 cm    TR Peak grad:   28.1 mmHg MV Decel Time: 296 msec    TR Vmax:        265.00 cm/s MV E velocity: 25.30 cm/s MV A velocity: 51.40 cm/s  SHUNTS MV E/A ratio:  0.49        Systemic VTI:  0.06 m                            Systemic Diam: 2.10 cm Kaitlyn Franko McleanMD Electronically signed by Franki Monte Signature Date/Time: 02/23/2022/4:09:48 PM    Final    DG Chest Port 1 View  Result Date: 01/26/2022 CLINICAL DATA:  Shortness of breath history of breast cancer EXAM: PORTABLE CHEST 1 VIEW COMPARISON:  CT chest, 01/25/2022 FINDINGS: Cardiomegaly. Masslike consolidation of the peripheral left upper lobe. The visualized skeletal structures are unremarkable. IMPRESSION: 1. Masslike consolidation of the peripheral left upper lobe, as seen by prior CT. 2. Cardiomegaly. Electronically Signed   By: Delanna Ahmadi M.D.   On: 02/25/2022 14:11     Medications:     Scheduled Medications:  aspirin  81 mg Oral Pre-Cath   aspirin EC  81 mg Oral Daily   Chlorhexidine Gluconate Cloth  6 each Topical Daily   Chlorhexidine Gluconate Cloth  6 each Topical Daily   lidocaine  1 patch Transdermal QHS   pantoprazole  40 mg Oral Daily   sodium chloride flush  10-40 mL Intracatheter Q12H   sodium chloride flush  3 mL Intravenous Q12H   sodium chloride flush  3 mL Intravenous Q12H    Infusions:  sodium chloride     sodium chloride     sodium chloride     sodium chloride     sodium chloride     DOBUTamine 5 mcg/kg/min (01/28/22 0700)   furosemide (LASIX) 200 mg in dextrose 5 % 100 mL (2 mg/mL) infusion     norepinephrine (LEVOPHED)  Adult infusion 24 mcg/min (01/28/22 0700)    PRN Medications: sodium chloride, sodium chloride, Place/Maintain arterial line **AND** sodium chloride, Place/Maintain arterial line **AND** sodium chloride, acetaminophen, ondansetron (ZOFRAN) IV, sodium chloride flush, sodium chloride flush, sodium chloride flush   Assessment/Plan   1. Shock: Suspect cardiogenic due to RV failure.  Echo showed EF 55-60%, small compressed LV, D-shaped septum, severe RV dilation with severely decreased systolic function, IVC dilated.  Most recent previous echo in 7/22 was normal. RHC showed elevated RA pressure/RV failure with low PAPi and CI but normal PCWP.  There was moderate PAH with PVR 6.45.  Cause of RV failure is uncertain. CTA chest 7/31 did not show evidence for acute pulmonary embolus.  Elevated PVR suggests that there could be a pulmonary vascular issue causing the RV failure, but prior echo in 7/22 did not show signs of this.  Cannot fully rule out chronic PE without V/Q scan, would also consider pulmonary veno-occlusive disease in the setting of prior chemotherapy.  Also consider microthrombi from tumor spread.  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  In the case report, the ICI was thought to trigger an SLE-like picture with the onset of PAH (+ANA).  She is currently on Norepinephrine at 23 with dobutamine 5.  MAP stable 65, CI 2.4 with co-ox 55%, lactate still elevated at 4.2.  CVP 14, UOP 910 cc with creatinine lower 3.22.  - Increase dobutamine to 7.5 mcg/kg/min for RV support with lactate still elevated and marginal co-ox.  Trend lactate.   - Continue NE as needed to maintain MAP.  - Lasix 80 mg IV x 1 then 12 mg/hr. Ultimately, need significant volume off to decrease RV size and thus decompress LV and improve hemodynamics.  - I do not think she would be a good candidate for RV support device (Protek Duo or Impella RP).  She has elevated PVR suggesting pulmonary vasoconstriction that may respond poorly to increased flow into the PA, and we do not have a great end point here with worry for metastatic breast cancer and possible PVOD.  - Reasonable to anticoagulate given concern for possible chronic PEs or tumor microthormbi, start heparin with negative CT head.  - Will need V/Q scan to look for chronic PEs and high resolution CT chest to look for signs of PVOD when more stable.  - Check ANA 2. Elevated troponin: Suspect demand ischemia from shock and RV failure.  Mild elevation without significant trend.  3. Headache: Severe at times, constant for several days.  CT head negative.  4. AKI: Creatinine up to 4.22 from 1.13 in setting of shock.  Hopefully will improve with hemodynamic support. Down to 3.22 today.  5. Elevated LFTs: New, suspect shock liver. Still mild up-trend.  6. Breast cancer: On left, diagnosed in 2022, had left mastectomy, chemotherapy, and XRT.  She had carboplatin, pembrolizumab, and paclitaxel.  CT chest in 7/23 was concerning for metastatic disease  with T4 lesion and extensive lymphadenopathy.  - Will need to get an idea about prognosis from oncology.   CRITICAL CARE Performed by: Loralie Champagne  Total critical care time: 40 minutes  Critical care time was exclusive of separately billable procedures and treating other patients.  Critical care was necessary to treat or prevent imminent or life-threatening deterioration.  Critical care was time spent personally  by me on the following activities: development of treatment plan with patient and/or surrogate as well as nursing, discussions with consultants, evaluation of patient's response to treatment, examination of patient, obtaining history from patient or surrogate, ordering and performing treatments and interventions, ordering and review of laboratory studies, ordering and review of radiographic studies, pulse oximetry and re-evaluation of patient's condition.   Length of Stay: 1  Loralie Champagne, MD  01/28/2022, 7:22 AM  Advanced Heart Failure Team Pager [phone] (M-F; 7a - 5p)  Please contact [facility] for night-coverage after hours (5p -7a ) and weekends on [email or web address]

## 2022-01-28 NOTE — Progress Notes (Signed)
ANTICOAGULATION CONSULT NOTE - Initial Consult  Pharmacy Consult for heparin Indication: ?pulmonary veno-occlusive disease  No Known Allergies  Patient Measurements: Height: '5\' 6"'$  (167.6 cm) Weight: 94.1 kg (207 lb 7.3 oz) IBW/kg (Calculated) : 59.3 Heparin Dosing Weight: 79.5kg  Vital Signs: Temp: 98.2 F (36.8 C) (08/03 0645) Temp Source: Core (08/03 0400) BP: 109/67 (08/03 0000) Pulse Rate: 104 (08/03 0645)  Labs: Recent Labs    01/25/22 1147 02/22/2022 1132 01/30/2022 1332 02/18/2022 1730 02/11/2022 1920 01/30/2022 2042 01/28/22 0255  HGB  --  12.7  --    < > 12.2 12.6 12.3  HCT  --  39.7  --    < > 37.9 37.0 36.6  PLT  --  128*  --   --  106*  --  103*  CREATININE  --  3.43*  --   --  4.22*  --  3.22*  TROPONINIHS 359* 710* 754*  --   --   --   --    < > = values in this interval not displayed.     Estimated Creatinine Clearance: 21.5 mL/min (A) (by C-G formula based on SCr of 3.22 mg/dL (H)).   Medical History: Past Medical History:  Diagnosis Date   Breast cancer ([place])    left breast IDC   Crohn's colitis ([place])    Family history of lung cancer 10/02/2020   GERD (gastroesophageal reflux disease)    Hypertension    Pre-diabetes     Assessment: 34 YOF presenting with SOB and weakness, now s/p cath showing severe RH failure and cardiogenic shock. CT head showed no acute findings and CT chest revealed possible venoocclusive disease vs microemboli of cancer to small vessels. Heparin consult initially placed for ACS, but was canceled before heparin administration. She is not on anticoagulation. Plts 103, CBC otherwise WNL. No bleeding currently. Will  follow up plans for long term coag.  Goal of Therapy:  Heparin level 0.3-0.7 units/ml Monitor platelets by anticoagulation protocol: Yes   Plan:  Heparin 2000 units IV x 1 and gtt at 1200 units/hr F/u 8 hour heparin level  Thank you for allowing pharmacy to participate in this patient's care.  Reatha Harps,  PharmD PGY2 Pharmacy Resident 01/28/2022 7:40 AM Check [email or web address] for unit specific pharmacy number

## 2022-01-28 NOTE — TOC Initial Note (Signed)
Transition of Care [facility]) - Initial/Assessment Note    Patient Details  Name: Rita Cole MRN: [id number] Date of Birth: 1960/08/25  [facility] CM/SW Contact:    Erenest Rasher, RN Phone Number: [phone] 01/28/2022, 4:47 PM  Clinical Narrative:                 HF TOC CM spoke pt and dtr, Caryl Pina. States pt's mother and sister will be here to assist after dc. Will continue to follow for dc needs.   Expected Discharge Plan: [place] Barriers to Discharge: Continued Medical Work up   Patient Goals and CMS Choice Patient states their goals for this hospitalization and ongoing recovery are:: wants to get back to her baseline CMS Medicare.gov Compare Post Acute Care list provided to:: Patient    Expected Discharge Plan and Services Expected Discharge Plan: [place]   Discharge Planning Services: CM Consult   Living arrangements for the past 2 months: Single Family Home                                      Prior Living Arrangements/Services Living arrangements for the past 2 months: Single Family Home Lives with:: Self Patient language and need for interpreter reviewed:: Yes Do you feel safe going back to the place where you live?: Yes      Need for Family Participation in Patient Care: Yes (Comment) Care giver support system in place?: Yes (comment) Current home services: DME (oxygen ([facility])) Criminal Activity/Legal Involvement Pertinent to Current Situation/Hospitalization: No - Comment as needed  Activities of Daily Living      Permission Sought/Granted Permission sought to share information with : Case Manager, Family Supports, PCP Permission granted to share information with : Yes, Verbal Permission Granted  Share Information with NAME: Lillia [place]     Permission granted to share info w Relationship: daughter  Permission granted to share info w Contact Information:  [phone]  Emotional Assessment Appearance:: Appears stated age Attitude/Demeanor/Rapport: Engaged Affect (typically observed): Accepting Orientation: : Oriented to Self, Oriented to Place, Oriented to  Time, Oriented to Situation   Psych Involvement: No (comment)  Admission diagnosis:  Cardiogenic shock ([place]) [R57.0] AKI (acute kidney injury) ([place]) [N17.9] Acute congestive heart failure, unspecified heart failure type [facility]) [I50.9] Patient Active Problem List   Diagnosis Date Noted   Cardiogenic shock ([place]) 02/06/2022   AKI (acute kidney injury) ([place])    Acute respiratory failure with hypoxia ([place])    Colitis 07/06/2021   Sepsis ([place]) 07/06/2021   Hypokalemia 07/06/2021   Hypomagnesemia 07/06/2021   Hypertension    S/P mastectomy, left 05/14/2021   Port-A-Cath in place 11/10/2020   Genetic testing 10/13/2020   Family history of lung cancer 10/02/2020   Malignant neoplasm of upper-outer quadrant of left breast in female, estrogen receptor negative ([place]) 09/30/2020   PCP:  Riki Sheer, NP Pharmacy:   [facility] [place], [address] [place] [address] Phone: [phone] Fax: [phone]     Social Determinants of Health (SDOH) Interventions    Readmission Risk Interventions     No data to display

## 2022-01-28 NOTE — Progress Notes (Signed)
NAME:  Rita Cole, MRN:  [id number], DOB:  [birth year], LOS: 1 ADMISSION DATE:  02/10/2022, CONSULTATION DATE:  8/2 REFERRING MD:  Neena Rhymes FOR CONSULT: Medical management   History of Present Illness:  Rita Cole is a 61 y.o. F with a PMX of stage IIIc Breast cancer L breast, ER negative s/p chemo (carboplatin, paclitaxel, pembrolizumab) radiation and mastectomy, HTN, DM presented to ED 8/2 with SOB.  SOB had started a few weeks ago and was progressive such that on 7/31 pt presented to [facility] Ed in this setting. Associated chest discomfort. CTA chest r/o PE at that time but did have new RA and RV enlargement, as well as LUL consolidation, and extensive lymphadenopathy. She was dc home    Presented to Cancer center 8/1 and noted to be hypoxic, and was ordered home Oxygen, as well as an ECHO with concern for pHTN.    Pt then presented to Cancer center 8/2 for IVF and was found to be hypoxic and in respiratory distress during infusion. Rapid response was called and pt was transported to ED for Hypoxic, Hypotension, Resp Distress. In ED she was started on NE and O2 titrated. Labs revealed a new AKI and acute liver injury. There was concern for cardiogenic shock, so pt transferred [facility] ED - [facility] ED, where Adv HF was consulted for suspected cardiogenic shock.   STAT ECHO obtained and shows marked RV failure -- D shaped septum, severely reduced RV systolic fxn, severely elevated RV, mildly elevated PASP, RVSP 43.1  Patient with right sided cardiogenic shock and was planned for urgent RHC + swan placement    Admitting to Cardiology service with Adv HF and PCCM consulted in this setting   Pertinent  Medical History  She has a past medical history of L breast cancer (5/22, treated w/chemo and radition, s/p mastectomy), crohn's, GERD, HTN.  Significant Hospital Events: Including procedures, antibiotic start and stop dates in addition to other pertinent events   8/2 rapid response  at cancer center for hypoxia hypotension resp distress. ED to [facility] cards eval, found to have acute right sided heart failure with cardiogenic shock. Admit to cards, Adv HF and PCCM consult. Cath lab for [place] + swan.  Interim History / Subjective:  Patient stated feeling little bit better Started making some urine, made 910 cc  Objective   Blood pressure 109/67, pulse (!) 110, temperature 98.1 F (36.7 C), resp. rate 17, height '5\' 6"'$  (1.676 m), weight 94.1 kg, SpO2 94 %. PAP: (45-81)/(29-62) 45/39 CVP:  [0 mmHg-30 mmHg] 13 mmHg PCWP:  [14 mmHg] 14 mmHg CO:  [2.8 L/min-3.5 L/min] 3.1 L/min CI:  [2.2 L/min/m2-2.7 L/min/m2] 2.4 L/min/m2      Intake/Output Summary (Last 24 hours) at 01/28/2022 1017 Last data filed at 01/28/2022 0800 Gross per 24 hour  Intake 1472.97 ml  Output 910 ml  Net 562.97 ml   Filed Weights   02/24/2022 1514 01/28/22 0700  Weight: 92.1 kg 94.1 kg    Examination: Physical exam: General: Acute on chronically ill-appearing obese female, lying on the bed HEENT: [place]/AT, eyes anicteric.  moist mucus membranes Neuro: Alert, awake following commands Chest: Bilateral basal crackles, no wheezes or rhonchi Heart: Regular rate and rhythm, no murmurs or gallops Abdomen: Soft, nontender, nondistended, bowel sounds present Skin: No rash  Urine output 910 cc Lactate elevated 4.2 Coox 55% Serum creatinine down to 3.2   Resolved Hospital Problem list     Assessment & Plan:  Cardiogenic shock secondary to isolated RV failure Pulmonary artery hypertension Lactic acidosis secondary to above Demand cardiac ischemia ECHO: LVEF 55-60, RV overload, D shaped septum, RVSF severely reduced PE was ruled out on CTA on 8/2. No clear etiology at this time could be due to pulmonary venoocclusive disease in the setting of chemotherapy for breast cancer Heart failure team is following Dobutamine was increased to 7.5 Continue Levophed with map goal 65 Levophed  requirement keep going up, will add vasopressin Continue IV Lasix infusion Monitor intake and output Trend lactate Started on IV heparin infusion for possible pulmonary venoocclusive disease Will need HRCT/VQ scan once clinically stable Trend troponins  Acute respiratory failure with hypoxia Secondary to cardiogenic shock. 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 (5/22, treated w/chemo and radition, s/p mastectomy) carboplatin, paclitaxel, pembrolizumab Oncology follow-up as requested  Hypertension Hold antihypertensives  Best Practice (right click and "Reselect all SmartList Selections" daily)   Diet/type: Regular consistency DVT prophylaxis: systemic heparin GI prophylaxis: PPI Lines: Central line and yes and it is still needed Foley:  Yes, and it is still needed Code Status:  full code Last date of multidisciplinary goals of care discussion [Per primary team]  Labs   CBC: Recent Labs  Lab 01/25/22 1024 02/21/2022 1132 02/19/2022 1730 02/14/2022 1731 02/01/2022 1920 02/06/2022 2042 01/28/22 0255  WBC 8.5 9.5  --   --  9.8  --  11.2*  NEUTROABS  --  7.7  --   --   --   --   --   HGB 13.6 12.7 12.9 13.3 12.2 12.6 12.3  HCT 41.4 39.7 38.0 39.0 37.9 37.0 36.6  MCV 89.0 90.6  --   --  89.6  --  88.0  PLT 117* 128*  --   --  106*  --  103*    Basic Metabolic Panel: Recent Labs  Lab 01/25/22 1024 02/14/2022 1132 02/05/2022 1730 01/28/2022 1731 01/30/2022 1920 01/26/2022 2042 01/28/22 0255  NA 140 139 138 138 137 137 139  K 3.9 4.7 4.8 4.8  4.9 4.7 3.8  CL 107 103  --   --  103  --  100  CO2 21* 20*  --   --  16*  --  21*  GLUCOSE 109* 87  --   --  102*  --  157*  BUN 23* 59*  --   --  68*  --  70*  CREATININE 1.13* 3.43*  --   --  4.22*  --  3.22*  CALCIUM 9.2 9.0  --   --  8.7*  --  8.8*  MG  --   --   --   --  2.7*  --  2.5*   GFR: Estimated Creatinine Clearance: 21.5 mL/min (A) (by C-G formula based on SCr of 3.22 mg/dL (H)). Recent Labs  Lab 01/25/22 1024 02/11/2022 1132 02/14/2022 1340 01/26/2022 1859 02/20/2022 1920 01/28/22 0255  WBC 8.5 9.5  --   --  9.8 11.2*  LATICACIDVEN  --   --  3.7* 5.2*  --  4.2*  Liver Function Tests: Recent Labs  Lab 01/25/22 1024 02/01/2022 1132 01/28/22 0255  AST 99* 181* 514*  ALT 90* 153* 349*  ALKPHOS 103 126 132*  BILITOT 0.8 1.0 1.4*  PROT 7.5 6.8 6.5  ALBUMIN 3.8 3.5 3.4*   No results for input(s): "LIPASE", "AMYLASE" in the last 168 hours. No results for input(s): "AMMONIA" in the last 168 hours.  ABG    Component Value Date/Time   PHART 7.324 (L) 01/26/2022 2042   PCO2ART 30.8 (L) 02/13/2022 2042   PO2ART 76 (L) 01/30/2022 2042   HCO3 16.0 (L) 02/08/2022 2042   TCO2 17 (L) 02/19/2022 2042   ACIDBASEDEF 9.0 (H) 01/28/2022 2042   O2SAT 54.7 01/28/2022 0255     Coagulation Profile: No results for input(s): "INR", "PROTIME" in the last 168 hours.  Cardiac Enzymes: No results for input(s): "CKTOTAL", "CKMB", "CKMBINDEX", "TROPONINI" in the last 168 hours.  HbA1C: Hgb A1c MFr Bld  Date/Time Value Ref Range Status  05/11/2021 09:35 AM 5.8 (H) 4.8 - 5.6 % Final    Comment:    (NOTE) Pre diabetes:          5.7%-6.4%  Diabetes:              >6.4%  Glycemic control for   <7.0% adults with diabetes     CBG: Recent Labs  Lab 02/08/2022 1126 02/24/2022 1919  GLUCAP 81 93   Critical care time:     Total critical care time: 41 minutes  Performed by: [place] care time was exclusive of separately billable procedures and treating other  patients.   Critical care was necessary to treat or prevent imminent or life-threatening deterioration.   Critical care was time spent personally by me on the following activities: development of treatment plan with patient and/or surrogate as well as nursing, discussions with consultants, evaluation of patient's response to treatment, examination of patient, obtaining history from patient or surrogate, ordering and performing treatments and interventions, ordering and review of laboratory studies, ordering and review of radiographic studies, pulse oximetry and re-evaluation of patient's condition.   Jacky Kindle, MD [facility] See Amion for pager If no response to pager, please call [phone] until 7pm After 7pm, Please call E-link [phone]

## 2022-01-28 NOTE — Progress Notes (Signed)
eLink Physician-Brief Progress Note Patient Name: Rita Cole DOB: 18-Feb-1961 MRN: [id number]   Date of Service  01/28/2022  HPI/Events of Note  CT head negative. Patient passed a bedside swallow evaluation.  eICU Interventions  Cardiac diet ordered.        [name] Antrone [place] 01/28/2022, 2:56 AM

## 2022-01-28 NOTE — Progress Notes (Signed)
ANTICOAGULATION CONSULT NOTE - Initial Consult  Pharmacy Consult for heparin Indication: ?pulmonary veno-occlusive disease  No Known Allergies  Patient Measurements: Height: '5\' 6"'$  (167.6 cm) Weight: 94.1 kg (207 lb 7.3 oz) IBW/kg (Calculated) : 59.3 Heparin Dosing Weight: 79.5kg  Vital Signs: Temp: 97.6 F (36.4 C) (08/03 1609) Temp Source: Oral (08/03 1609) Pulse Rate: 113 (08/03 1508)  Labs: Recent Labs    02/05/2022 1132 02/25/2022 1332 02/09/2022 1730 01/29/2022 1920 02/21/2022 2042 01/28/22 0255 01/28/22 1514  HGB 12.7  --    < > 12.2 12.6 12.3  --   HCT 39.7  --    < > 37.9 37.0 36.6  --   PLT 128*  --   --  106*  --  103*  --   HEPARINUNFRC  --   --   --   --   --   --  0.59  CREATININE 3.43*  --   --  4.22*  --  3.22*  --   TROPONINIHS 710* 754*  --   --   --   --   --    < > = values in this interval not displayed.     Estimated Creatinine Clearance: 21.5 mL/min (A) (by C-G formula based on SCr of 3.22 mg/dL (H)).   Medical History: Past Medical History:  Diagnosis Date   Breast cancer ([place])    left breast IDC   Crohn's colitis ([place])    Family history of lung cancer 10/02/2020   GERD (gastroesophageal reflux disease)    Hypertension    Pre-diabetes     Assessment: 8 YOF presenting with SOB and weakness, now s/p cath showing severe RH failure and cardiogenic shock. CT head showed no acute findings and CT chest revealed possible venoocclusive disease vs microemboli of cancer to small vessels. Heparin consult initially placed for ACS, but was canceled before heparin administration. She is not on anticoagulation. Plts 103, CBC otherwise WNL. No bleeding currently. Will  follow up plans for long term coag.  PM update: HL 0.59  Goal of Therapy:  Heparin level 0.3-0.7 units/ml Monitor platelets by anticoagulation protocol: Yes   Plan:  Continue Heparin gtt at 1200 units/hr F/u 8 hour confirmatory heparin level  Rosaland Shiffman A. Levada Dy, PharmD, BCPS, FNKF Clinical  Pharmacist [place] Please utilize Amion for appropriate phone number to reach the unit pharmacist ([place])  01/28/2022 4:10 PM Check [email or web address] for unit specific pharmacy number

## 2022-01-29 ENCOUNTER — Other Ambulatory Visit (HOSPITAL_COMMUNITY): Payer: Federal, State, Local not specified - PPO

## 2022-01-29 ENCOUNTER — Inpatient Hospital Stay: Payer: Federal, State, Local not specified - PPO | Admitting: Hematology and Oncology

## 2022-01-29 ENCOUNTER — Inpatient Hospital Stay (HOSPITAL_COMMUNITY): Payer: Federal, State, Local not specified - PPO

## 2022-01-29 ENCOUNTER — Inpatient Hospital Stay: Payer: Federal, State, Local not specified - PPO

## 2022-01-29 DIAGNOSIS — R570 Cardiogenic shock: Secondary | ICD-10-CM | POA: Diagnosis not present

## 2022-01-29 DIAGNOSIS — J9601 Acute respiratory failure with hypoxia: Secondary | ICD-10-CM | POA: Diagnosis not present

## 2022-01-29 DIAGNOSIS — N179 Acute kidney failure, unspecified: Secondary | ICD-10-CM | POA: Diagnosis not present

## 2022-01-29 LAB — CBC
HCT: 33.9 % — ABNORMAL LOW (ref 36.0–46.0)
HCT: 33.8 % — ABNORMAL LOW (ref 36.0–46.0)
Hemoglobin: 11.6 g/dL — ABNORMAL LOW (ref 12.0–15.0)
Hemoglobin: 11.3 g/dL — ABNORMAL LOW (ref 12.0–15.0)
MCH: 29.4 pg (ref 26.0–34.0)
MCH: 29.3 pg (ref 26.0–34.0)
MCHC: 34.2 g/dL (ref 30.0–36.0)
MCHC: 33.4 g/dL (ref 30.0–36.0)
MCV: 86 fL (ref 80.0–100.0)
MCV: 87.6 fL (ref 80.0–100.0)
Platelets: 69 10*3/uL — ABNORMAL LOW (ref 150–400)
Platelets: 61 K/uL — ABNORMAL LOW (ref 150–400)
RBC: 3.94 MIL/uL (ref 3.87–5.11)
RBC: 3.86 MIL/uL — ABNORMAL LOW (ref 3.87–5.11)
RDW: 16.8 % — ABNORMAL HIGH (ref 11.5–15.5)
RDW: 17.2 % — ABNORMAL HIGH (ref 11.5–15.5)
WBC: 9.6 10*3/uL (ref 4.0–10.5)
WBC: 8.6 K/uL (ref 4.0–10.5)
nRBC: 1.3 % — ABNORMAL HIGH (ref 0.0–0.2)
nRBC: 1.5 % — ABNORMAL HIGH (ref 0.0–0.2)

## 2022-01-29 LAB — ENA+DNA/DS+ANTICH+CENTRO+JO...
Anti JO-1: <0.2 AI (ref 0.0–0.9)
Centromere Ab Screen: <0.2 AI (ref 0.0–0.9)
Chromatin Ab SerPl-aCnc: 0.2 AI (ref 0.0–0.9)
ENA SM Ab Ser-aCnc: <0.2 AI (ref 0.0–0.9)
Ribonucleic Protein: 0.2 AI (ref 0.0–0.9)
SSA (Ro) (ENA) Antibody, IgG: 5.8 AI — ABNORMAL HIGH (ref 0.0–0.9)
SSB (La) (ENA) Antibody, IgG: 0.9 AI (ref 0.0–0.9)
Scleroderma (Scl-70) (ENA) Antibody, IgG: <0.2 AI (ref 0.0–0.9)
ds DNA Ab: <1 [IU]/mL (ref 0–9)

## 2022-01-29 LAB — COMPREHENSIVE METABOLIC PANEL
ALT: 315 U/L — ABNORMAL HIGH (ref 0–44)
AST: 282 U/L — ABNORMAL HIGH (ref 15–41)
Albumin: 3.2 g/dL — ABNORMAL LOW (ref 3.5–5.0)
Alkaline Phosphatase: 134 U/L — ABNORMAL HIGH (ref 38–126)
Anion gap: 12 (ref 5–15)
BUN: 45 mg/dL — ABNORMAL HIGH (ref 6–20)
CO2: 21 mmol/L — ABNORMAL LOW (ref 22–32)
Calcium: 8.2 mg/dL — ABNORMAL LOW (ref 8.9–10.3)
Chloride: 100 mmol/L (ref 98–111)
Creatinine, Ser: 1.49 mg/dL — ABNORMAL HIGH (ref 0.44–1.00)
GFR, Estimated: 40 mL/min — ABNORMAL LOW (ref >60)
Glucose, Bld: 139 mg/dL — ABNORMAL HIGH (ref 70–99)
Potassium: 3.6 mmol/L (ref 3.5–5.1)
Sodium: 133 mmol/L — ABNORMAL LOW (ref 135–145)
Total Bilirubin: 1.4 mg/dL — ABNORMAL HIGH (ref 0.3–1.2)
Total Protein: 6.3 g/dL — ABNORMAL LOW (ref 6.5–8.1)

## 2022-01-29 LAB — LACTIC ACID, PLASMA: Lactic Acid, Venous: 2.4 mmol/L (ref 0.5–1.9)

## 2022-01-29 LAB — CORTISOL: Cortisol, Plasma: 22.5 ug/dL

## 2022-01-29 LAB — COOXEMETRY PANEL
Carboxyhemoglobin: 1.2 % (ref 0.5–1.5)
Carboxyhemoglobin: 0.7 % (ref 0.5–1.5)
Carboxyhemoglobin: 0.4 % — ABNORMAL LOW (ref 0.5–1.5)
Methemoglobin: <0.7 % (ref 0.0–1.5)
Methemoglobin: <0.7 % (ref 0.0–1.5)
Methemoglobin: <0.7 % (ref 0.0–1.5)
O2 Saturation: 97.6 %
O2 Saturation: 96 %
O2 Saturation: 49 %
Total hemoglobin: 11.7 g/dL — ABNORMAL LOW (ref 12.0–16.0)
Total hemoglobin: 11.8 g/dL — ABNORMAL LOW (ref 12.0–16.0)
Total hemoglobin: 11.1 g/dL — ABNORMAL LOW (ref 12.0–16.0)

## 2022-01-29 LAB — SJOGRENS SYNDROME-A EXTRACTABLE NUCLEAR ANTIBODY: SSA (Ro) (ENA) Antibody, IgG: 6.2 AI — ABNORMAL HIGH (ref 0.0–0.9)

## 2022-01-29 LAB — ANA W/REFLEX IF POSITIVE: Anti Nuclear Antibody (ANA): POSITIVE — AB

## 2022-01-29 LAB — CENTROMERE ANTIBODIES: Centromere Ab Screen: <0.2 AI (ref 0.0–0.9)

## 2022-01-29 LAB — BASIC METABOLIC PANEL WITH GFR
Anion gap: 11 (ref 5–15)
BUN: 40 mg/dL — ABNORMAL HIGH (ref 6–20)
CO2: 24 mmol/L (ref 22–32)
Calcium: 8.3 mg/dL — ABNORMAL LOW (ref 8.9–10.3)
Chloride: 100 mmol/L (ref 98–111)
Creatinine, Ser: 1.34 mg/dL — ABNORMAL HIGH (ref 0.44–1.00)
GFR, Estimated: 45 mL/min — ABNORMAL LOW
Glucose, Bld: 133 mg/dL — ABNORMAL HIGH (ref 70–99)
Potassium: 3.4 mmol/L — ABNORMAL LOW (ref 3.5–5.1)
Sodium: 135 mmol/L (ref 135–145)

## 2022-01-29 LAB — MAGNESIUM: Magnesium: 1.9 mg/dL (ref 1.7–2.4)

## 2022-01-29 LAB — SJOGRENS SYNDROME-B EXTRACTABLE NUCLEAR ANTIBODY: SSB (La) (ENA) Antibody, IgG: 1 AI — ABNORMAL HIGH (ref 0.0–0.9)

## 2022-01-29 LAB — HEPARIN LEVEL (UNFRACTIONATED): Heparin Unfractionated: 0.69 IU/mL (ref 0.30–0.70)

## 2022-01-29 LAB — TSH: TSH: 3.834 u[IU]/mL (ref 0.350–4.500)

## 2022-01-29 MED ORDER — MAGNESIUM SULFATE 2 GM/50ML IV SOLN
2.0000 g | Freq: Once | INTRAVENOUS | Status: AC
  Administered 2022-01-29: 2 g via INTRAVENOUS
  Filled 2022-01-29: qty 50

## 2022-01-29 MED ORDER — ENOXAPARIN SODIUM 40 MG/0.4ML IJ SOSY
40.0000 mg | PREFILLED_SYRINGE | INTRAMUSCULAR | Status: DC
Start: 2022-01-29 — End: 2022-01-31
  Administered 2022-01-29 – 2022-01-30 (×2): 40 mg via SUBCUTANEOUS
  Filled 2022-01-29 (×2): qty 0.4

## 2022-01-29 MED ORDER — POTASSIUM CHLORIDE CRYS ER 20 MEQ PO TBCR
40.0000 meq | EXTENDED_RELEASE_TABLET | Freq: Two times a day (BID) | ORAL | Status: AC
  Administered 2022-01-29 – 2022-01-30 (×2): 40 meq via ORAL
  Filled 2022-01-29 (×2): qty 2

## 2022-01-29 MED ORDER — TRAMADOL HCL 50 MG PO TABS
100.0000 mg | ORAL_TABLET | Freq: Two times a day (BID) | ORAL | Status: DC | PRN
  Administered 2022-01-29: 100 mg via ORAL
  Filled 2022-01-29: qty 2

## 2022-01-29 MED ORDER — METHYLPREDNISOLONE SODIUM SUCC 125 MG IJ SOLR
125.0000 mg | Freq: Every day | INTRAMUSCULAR | Status: AC
  Administered 2022-01-29 – 2022-02-02 (×5): 125 mg via INTRAVENOUS
  Filled 2022-01-29 (×5): qty 2

## 2022-01-29 MED ORDER — METOLAZONE 2.5 MG PO TABS
2.5000 mg | ORAL_TABLET | Freq: Once | ORAL | Status: AC
  Administered 2022-01-29: 2.5 mg via ORAL
  Filled 2022-01-29: qty 1

## 2022-01-29 NOTE — Progress Notes (Signed)
Patient ID: Rita Cole, female   DOB: 1960-06-29, 61 y.o.   MRN: [id number]     Advanced Heart Failure Rounding Note  PCP-Cardiologist: None   Subjective:    This morning, patient is on dobutamine 7.5 + NE 19 stable MAP. CVP 17.  Creatinine 4.22 => 3.22 => 1.49.  Improving UOP, -2372 with weight down 3 lbs.   Feels better, eating.   Plts down to 69K.   CXR yesterday with stable LUL consolidation.   Swan:  RA 17 PA 40/34 CI 2.5 Co-ox not accurate  RHC (8/2): Hemodynamics (mmHg) RA mean 19 RV 53/19 PA 47/22, mean 32 PCWP mean 12 Oxygen saturations: PA 47% AO 97% Cardiac Output (Fick) 3.1  Cardiac Index (Fick) 1.54 PVR 6.45 WU PAPi 1.3   Echo: EF 55-60%, small compressed LV, D-shaped septum, severe RV dilation with severely decreased systolic function, IVC dilated.   Objective:   Weight Range: 92.7 kg Body mass index is 32.99 kg/m.   Vital Signs:   Temp:  [97.5 F (36.4 C)-100.4 F (38 C)] 99 F (37.2 C) (08/04 0600) Pulse Rate:  [105-130] 120 (08/04 0600) Resp:  [7-32] 23 (08/04 0600) SpO2:  [85 %-97 %] 96 % (08/04 0600) Arterial Line BP: (90-127)/(54-76) 104/54 (08/04 0600) Weight:  [92.7 kg] 92.7 kg (08/04 0500) Last BM Date : 01/29/2022  Weight change: Filed Weights   02/24/2022 1514 01/28/22 0700 01/29/22 0500  Weight: 92.1 kg 94.1 kg 92.7 kg    Intake/Output:   Intake/Output Summary (Last 24 hours) at 01/29/2022 0737 Last data filed at 01/29/2022 0700 Gross per 24 hour  Intake 1207.54 ml  Output 3580 ml  Net -2372.46 ml      Physical Exam    General: NAD Neck: JVP 16+ cm  Lungs: Decreased at bases CV: Nondisplaced PMI.  Heart tachy, regular S1/S2, no S3/S4, no murmur.  No peripheral edema.  No carotid bruit.  Normal pedal pulses.  Abdomen: Soft, nontender, no hepatosplenomegaly, no distention.  Skin: Intact without lesions or rashes.  Neurologic: Alert and oriented x 3.  Psych: Normal affect. Extremities: No clubbing or  cyanosis.  HEENT: Normal.    Telemetry   NSR 100s (personally reviewed)   Labs    CBC Recent Labs    02/21/2022 1132 01/29/2022 1730 01/28/22 0255 01/29/22 0544  WBC 9.5   < > 11.2* 9.6  NEUTROABS 7.7  --   --   --   HGB 12.7   < > 12.3 11.6*  HCT 39.7   < > 36.6 33.9*  MCV 90.6   < > 88.0 86.0  PLT 128*   < > 103* 69*   < > = values in this interval not displayed.   Basic Metabolic Panel Recent Labs    01/28/22 0255 01/29/22 0544  NA 139 133*  K 3.8 3.6  CL 100 100  CO2 21* 21*  GLUCOSE 157* 139*  BUN 70* 45*  CREATININE 3.22* 1.49*  CALCIUM 8.8* 8.2*  MG 2.5* 1.9   Liver Function Tests Recent Labs    01/28/22 0255 01/29/22 0544  AST 514* 282*  ALT 349* 315*  ALKPHOS 132* 134*  BILITOT 1.4* 1.4*  PROT 6.5 6.3*  ALBUMIN 3.4* 3.2*   No results for input(s): "LIPASE", "AMYLASE" in the last 72 hours. Cardiac Enzymes No results for input(s): "CKTOTAL", "CKMB", "CKMBINDEX", "TROPONINI" in the last 72 hours.  BNP: BNP (last 3 results) Recent Labs    02/08/2022 1132  BNP 750.4*  ProBNP (last 3 results) No results for input(s): "PROBNP" in the last 8760 hours.   D-Dimer No results for input(s): "DDIMER" in the last 72 hours. Hemoglobin A1C No results for input(s): "HGBA1C" in the last 72 hours. Fasting Lipid Panel No results for input(s): "CHOL", "HDL", "LDLCALC", "TRIG", "CHOLHDL", "LDLDIRECT" in the last 72 hours. Thyroid Function Tests No results for input(s): "TSH", "T4TOTAL", "T3FREE", "THYROIDAB" in the last 72 hours.  Invalid input(s): "FREET3"  Other results:   Imaging    DG CHEST PORT 1 VIEW  Result Date: 01/28/2022 CLINICAL DATA:  Congestive heart failure EXAM: PORTABLE CHEST 1 VIEW COMPARISON:  01/28/2019 FINDINGS: Right IJ Swan-Ganz catheter tip similar, central right pulmonary artery mid right pulmonary artery. Surgical clips project over the left axilla. Cardiomegaly accentuated by AP portable technique. No pleural effusion or  pneumothorax. Confluent peripheral left upper lobe airspace disease is minimally decreased. Developing right base airspace disease. IMPRESSION: Slight improvement in left upper lobe peripheral consolidation. Developing right base atelectasis or infection. Cardiomegaly. Electronically Signed   By: Abigail Miyamoto M.D.   On: 01/28/2022 08:21     Medications:     Scheduled Medications:  Chlorhexidine Gluconate Cloth  6 each Topical Daily   Chlorhexidine Gluconate Cloth  6 each Topical Daily   lidocaine  1 patch Transdermal QHS   pantoprazole  40 mg Oral Daily   sodium chloride flush  10-40 mL Intracatheter Q12H   sodium chloride flush  3 mL Intravenous Q12H   sodium chloride flush  3 mL Intravenous Q12H    Infusions:  sodium chloride     sodium chloride     sodium chloride     sodium chloride     sodium chloride     DOBUTamine 7.5 mcg/kg/min (01/29/22 0600)   furosemide (LASIX) 200 mg in dextrose 5 % 100 mL (2 mg/mL) infusion 10 mg/hr (01/29/22 0600)   heparin 1,050 Units/hr (01/29/22 0702)   norepinephrine (LEVOPHED) Adult infusion 19 mcg/min (01/29/22 0600)    PRN Medications: sodium chloride, sodium chloride, Place/Maintain arterial line **AND** sodium chloride, Place/Maintain arterial line **AND** sodium chloride, acetaminophen, fentaNYL (SUBLIMAZE) injection, ondansetron (ZOFRAN) IV, sodium chloride flush, sodium chloride flush, sodium chloride flush   Assessment/Plan   1. Shock: Suspect cardiogenic due to RV failure.  Echo showed EF 55-60%, small compressed LV, D-shaped septum, severe RV dilation with severely decreased systolic function, IVC dilated.  Most recent previous echo in 7/22 was normal. RHC showed elevated RA pressure/RV failure with low PAPi and CI but normal PCWP.  There was moderate PAH with PVR 6.45.  Cause of RV failure is uncertain. CTA chest 7/31 did not show evidence for acute pulmonary embolus.  Elevated PVR suggests that there could be a pulmonary vascular issue  causing the RV failure, but prior echo in 7/22 did not show signs of this.  Cannot fully rule out chronic PE without V/Q scan, would also consider pulmonary veno-occlusive disease in the setting of prior chemotherapy.  Also consider microthrombi from tumor spread.  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  In the case report, the ICI was thought to trigger an SLE-like picture with the onset of PAH (+ANA).  She is currently on Norepinephrine at 19 with dobutamine 7.5.  MAP stable, CI 2.5, co-ox not accurate today.  CVP 16-17, good UOP with creatinine down  to 1.49 (?surprising drop).  - Continue dobutamine 7.5 mcg/kg/min for RV support. Resend co-ox and recheck lactate.    - Continue NE as needed to maintain MAP.  - Increase Lasix gtt to 12 mg/hr, give metolazone 2.5 x 1. Ultimately, need significant volume off to decrease RV size and thus decompress LV and improve hemodynamics.  - I do not think she would be a good candidate for RV support device (Protek Duo or Impella RP).  She has elevated PVR suggesting pulmonary vasoconstriction that may respond poorly to increased flow into the PA.  - Will need V/Q scan to look for chronic PEs and high resolution CT chest to look for signs of PVOD when more stable.  - Check ANA 2. Elevated troponin: Suspect demand ischemia from shock and RV failure.  Mild elevation without significant trend.  3. Headache: Severe at times, constant for several days.  CT head negative.  4. AKI: Creatinine up to 4.22 from 1.13 in setting of shock.  Hopefully will improve with hemodynamic support. Down to 1.49 today.  5. Elevated LFTs: New, suspect shock liver. Trending down.  6. Breast cancer: On left, diagnosed in 2022, had left mastectomy, chemotherapy, and XRT.  She had carboplatin,  pembrolizumab, and paclitaxel.  CT chest in 7/23 was concerning for metastatic disease with T4 lesion and extensive lymphadenopathy. Oncology plan would be lymph node biopsy eventually, think she would have reasonable short-term prognosis as CT burden of potential metastatic disease would not be high.  7. Thrombocytopenia: ?Due to critical illness versus due to heparin (HIT, but seems early).  - Stop heparin gtt as she does not have a definitive indication.  - Can use Lovenox for DVT prophylaxis.    CRITICAL CARE Performed by: Loralie Champagne  Total critical care time: 40 minutes  Critical care time was exclusive of separately billable procedures and treating other patients.  Critical care was necessary to treat or prevent imminent or life-threatening deterioration.  Critical care was time spent personally by me on the following activities: development of treatment plan with patient and/or surrogate as well as nursing, discussions with consultants, evaluation of patient's response to treatment, examination of patient, obtaining history from patient or surrogate, ordering and performing treatments and interventions, ordering and review of laboratory studies, ordering and review of radiographic studies, pulse oximetry and re-evaluation of patient's condition.   Length of Stay: 2  Loralie Champagne, MD  01/29/2022, 7:37 AM  Advanced Heart Failure Team Pager [phone] (M-F; 7a - 5p)  Please contact [facility] for night-coverage after hours (5p -7a ) and weekends on [email or web address]

## 2022-01-29 NOTE — Progress Notes (Signed)
NAME:  Rita Cole, MRN:  [id number], DOB:  Jun 12, 1961, LOS: 2 ADMISSION DATE:  02/20/2022, CONSULTATION DATE:  8/2 REFERRING MD:  Neena Rhymes FOR CONSULT: Medical management   History of Present Illness:  Rita Cole is a 60 y.o. F with a PMX of stage IIIc Breast cancer L breast, ER negative s/p chemo (carboplatin, paclitaxel, pembrolizumab) radiation and mastectomy, HTN, DM presented to ED 8/2 with SOB.  SOB had started a few weeks ago and was progressive such that on 7/31 pt presented to [facility] Ed in this setting. Associated chest discomfort. CTA chest r/o PE at that time but did have new RA and RV enlargement, as well as LUL consolidation, and extensive lymphadenopathy. She was dc home    Presented to Cancer center 8/1 and noted to be hypoxic, and was ordered home Oxygen, as well as an ECHO with concern for pHTN.    Pt then presented to Cancer center 8/2 for IVF and was found to be hypoxic and in respiratory distress during infusion. Rapid response was called and pt was transported to ED for Hypoxic, Hypotension, Resp Distress. In ED she was started on NE and O2 titrated. Labs revealed a new AKI and acute liver injury. There was concern for cardiogenic shock, so pt transferred [facility] ED - [facility] ED, where Adv HF was consulted for suspected cardiogenic shock.   STAT ECHO obtained and shows marked RV failure -- D shaped septum, severely reduced RV systolic fxn, severely elevated RV, mildly elevated PASP, RVSP 43.1  Patient with right sided cardiogenic shock and was planned for urgent RHC + swan placement    Admitting to Cardiology service with Adv HF and PCCM consulted in this setting   Pertinent  Medical History  She has a past medical history of L breast cancer (5/22, treated w/chemo and radition, s/p mastectomy), crohn's, GERD, HTN.  Significant Hospital Events: Including procedures, antibiotic start and stop dates in addition to other pertinent events   8/2 rapid response  at cancer center for hypoxia hypotension resp distress. ED [facility] eval, found to have acute right sided heart failure with cardiogenic shock. Admit to cards, Adv HF and PCCM consult. Cath lab for [place] + swan.  Interim History / Subjective:  Patient stated feeling about the same not much difference Remained net negative, made 3.1 L of urine Objective   Blood pressure 109/67, pulse (!) 116, temperature 98.6 F (37 C), resp. rate 20, height '5\' 6"'$  (1.676 m), weight 92.7 kg, SpO2 93 %. PAP: (38-74)/(33-60) 38/35 CVP:  [14 mmHg-31 mmHg] 18 mmHg      Intake/Output Summary (Last 24 hours) at 01/29/2022 1339 Last data filed at 01/29/2022 1300 Gross per 24 hour  Intake 1132.01 ml  Output 3470 ml  Net -2337.99 ml   Filed Weights   02/22/2022 1514 01/28/22 0700 01/29/22 0500  Weight: 92.1 kg 94.1 kg 92.7 kg    Examination: Physical exam: General: Acute on chronically ill-appearing obese female, lying on the bed HEENT: [place]/AT, eyes anicteric.  moist mucus membranes Neuro: Alert, awake following commands Chest: Faint bilateral basal crackles, no wheezes or rhonchi Heart: Regular rate and rhythm, no murmurs or gallops Abdomen: Soft, nontender, nondistended, bowel sounds present Skin: No rash  Urine output 3.1 L Lactate elevated 2.4 Coox 49% Serum creatinine down to 1.49   Resolved Hospital Problem list     Assessment & Plan:  Cardiogenic shock secondary to isolated RV failure Pulmonary artery hypertension Lactic  acidosis secondary to above Demand cardiac ischemia ECHO: Showing normal LV systolic function, hypokinetic and volume overloaded RV Questionable venoocclusive cause of pulmonary hypertension versus pembrolizumab therapy that she was on Heart failure team is following Dobutamine was increased to 7.5 Continue Levophed with map goal 65, currently at 13 mics Continue IV Lasix infusion at 12 mg/h Monitor intake and output Lactate is trending down On IV  heparin infusion for possible pulmonary venoocclusive disease Will need HRCT/VQ scan once clinically stable Trend troponins  Acute respiratory failure with hypoxia Secondary to cardiogenic shock and pulmonary edema On high flow nasal cannula oxygen Titrate FiO2 goal SPO2 92-95%  AKI Suspect cardio renal , baseline creatinine is around 1.1 She presented with serum creatinine of 4.2 Now serum creatinine is trending down, currently at 1.49 Avoid nephrotoxic agents Monitor intake and output  Shock liver in the setting of cardiogenic shock LFTs are improving with improvement in cardiogenic shock Closely monitor   Thrombocytopenia, could be related to chemotherapy versus acute illness Platelets count trended down to 69,000 Watch for signs of bleeding  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 Lindi Adie, he will meet with family at 1 PM  Hypertension Hold antihypertensives  Best Practice (right click and "Reselect all SmartList Selections" daily)   Diet/type: Regular consistency DVT prophylaxis: systemic heparin GI prophylaxis: PPI Lines: Central line and yes and it is still needed Foley:  Yes, and it is still needed Code Status:  full code Last date of multidisciplinary goals of care discussion [Per primary team]  Labs   CBC: Recent Labs  Lab 01/25/22 1024 02/20/2022 1132 02/01/2022 1730 01/28/2022 1731 02/15/2022 1920 02/18/2022 2042 01/28/22 0255 01/29/22 0544  WBC 8.5 9.5  --   --  9.8  --  11.2* 9.6  NEUTROABS  --  7.7  --   --   --   --   --   --   HGB 13.6 12.7   < > 13.3 12.2 12.6 12.3 11.6*  HCT 41.4 39.7   < > 39.0 37.9 37.0 36.6 33.9*  MCV 89.0 90.6  --   --  89.6  --  88.0 86.0  PLT 117* 128*  --   --  106*  --  103* 69*   < > = values in this interval not displayed.    Basic Metabolic Panel: Recent Labs  Lab 01/25/22 1024 02/09/2022 1132 02/10/2022 1730  02/21/2022 1731 02/25/2022 1920 02/17/2022 2042 01/28/22 0255 01/29/22 0544  NA 140 139   < > 138 137 137 139 133*  K 3.9 4.7   < > 4.8 4.9 4.7 3.8 3.6  CL 107 103  --   --  103  --  100 100  CO2 21* 20*  --   --  16*  --  21* 21*  GLUCOSE 109* 87  --   --  102*  --  157* 139*  BUN 23* 59*  --   --  68*  --  70* 45*  CREATININE 1.13* 3.43*  --   --  4.22*  --  3.22* 1.49*  CALCIUM 9.2 9.0  --   --  8.7*  --  8.8* 8.2*  MG  --   --   --   --  2.7*  --  2.5* 1.9   < > = values in this interval not displayed.   GFR: Estimated Creatinine Clearance: 46.1 mL/min (A) (by C-G formula  based on SCr of 1.49 mg/dL (H)). Recent Labs  Lab 01/26/2022 1132 02/24/2022 1340 02/21/2022 1859 02/24/2022 1920 01/28/22 0255 01/28/22 1016 01/29/22 0544 01/29/22 0737  WBC 9.5  --   --  9.8 11.2*  --  9.6  --   LATICACIDVEN  --    < > 5.2*  --  4.2* 3.2*  --  2.4*   < > = values in this interval not displayed.    Liver Function Tests: Recent Labs  Lab 01/25/22 1024 02/11/2022 1132 01/28/22 0255 01/29/22 0544  AST 99* 181* 514* 282*  ALT 90* 153* 349* 315*  ALKPHOS 103 126 132* 134*  BILITOT 0.8 1.0 1.4* 1.4*  PROT 7.5 6.8 6.5 6.3*  ALBUMIN 3.8 3.5 3.4* 3.2*   No results for input(s): "LIPASE", "AMYLASE" in the last 168 hours. No results for input(s): "AMMONIA" in the last 168 hours.  ABG    Component Value Date/Time   PHART 7.324 (L) 02/06/2022 2042   PCO2ART 30.8 (L) 02/19/2022 2042   PO2ART 76 (L) 01/26/2022 2042   HCO3 16.0 (L) 01/30/2022 2042   TCO2 17 (L) 02/08/2022 2042   ACIDBASEDEF 9.0 (H) 02/24/2022 2042   O2SAT 49 01/29/2022 0737     Coagulation Profile: No results for input(s): "INR", "PROTIME" in the last 168 hours.  Cardiac Enzymes: No results for input(s): "CKTOTAL", "CKMB", "CKMBINDEX", "TROPONINI" in the last 168 hours.  HbA1C: Hgb A1c MFr Bld  Date/Time Value Ref Range Status  05/11/2021 09:35 AM 5.8 (H) 4.8 - 5.6 % Final    Comment:    (NOTE) Pre diabetes:           5.7%-6.4%  Diabetes:              >6.4%  Glycemic control for   <7.0% adults with diabetes     CBG: Recent Labs  Lab 02/17/2022 1126 02/20/2022 1919  GLUCAP 81 93   Critical care time:     Total critical care time: 33 minutes  Performed by: [place] care time was exclusive of separately billable procedures and treating other patients.   Critical care was necessary to treat or prevent imminent or life-threatening deterioration.   Critical care was time spent personally by me on the following activities: development of treatment plan with patient and/or surrogate as well as nursing, discussions with consultants, evaluation of patient's response to treatment, examination of patient, obtaining history from patient or surrogate, ordering and performing treatments and interventions, ordering and review of laboratory studies, ordering and review of radiographic studies, pulse oximetry and re-evaluation of patient's condition.   Jacky Kindle, MD [place] [facility] See Amion for pager If no response to pager, please call [phone] until 7pm After 7pm, Please call E-link [phone]

## 2022-01-29 NOTE — Progress Notes (Signed)
ANTICOAGULATION CONSULT NOTE - Initial Consult  Pharmacy Consult for heparin Indication: ?pulmonary veno-occlusive disease  No Known Allergies  Patient Measurements: Height: '5\' 6"'$  (167.6 cm) Weight: 92.7 kg (204 lb 5.9 oz) IBW/kg (Calculated) : 59.3 Heparin Dosing Weight: 79.5kg  Vital Signs: Temp: 99 F (37.2 C) (08/04 0600) Pulse Rate: 120 (08/04 0600)  Labs: Recent Labs    02/19/2022 1132 02/08/2022 1332 02/10/2022 1730 02/19/2022 1920 02/24/2022 2042 01/28/22 0255 01/28/22 1514 01/29/22 0544  HGB 12.7  --    < > 12.2 12.6 12.3  --   --   HCT 39.7  --    < > 37.9 37.0 36.6  --   --   PLT 128*  --   --  106*  --  103*  --   --   HEPARINUNFRC  --   --   --   --   --   --  0.59 0.69  CREATININE 3.43*  --   --  4.22*  --  3.22*  --   --   TROPONINIHS 710* 754*  --   --   --   --   --   --    < > = values in this interval not displayed.     Estimated Creatinine Clearance: 21.3 mL/min (A) (by C-G formula based on SCr of 3.22 mg/dL (H)).   Medical History: Past Medical History:  Diagnosis Date   Breast cancer ([place])    left breast IDC   Crohn's colitis ([place])    Family history of lung cancer 10/02/2020   GERD (gastroesophageal reflux disease)    Hypertension    Pre-diabetes     Assessment: 16 YOF presenting with SOB and weakness, now s/p cath showing severe RH failure and cardiogenic shock. CT head showed no acute findings and CT chest revealed possible venoocclusive disease vs microemboli of cancer to small vessels. Heparin consult initially placed for ACS, but was canceled before heparin administration. She is not on anticoagulation PTA. Heparin level continued to trend upwards to 0.69. Given elevated risk of alveolar bleeding with PVOD and upward trend, will decrease rate to remain within therapeutic range. Plts declined from 103 to 69, CBC otherwise stable. No bleeding currently. Will follow up plans for anticoag.  Goal of Therapy:  Heparin level 0.3-0.7  units/ml Monitor platelets by anticoagulation protocol: Yes   Plan:  Decrease Heparin gtt to 1050 units/hr Repeat 8 hour heparin level with rate change Daily heparin level and CBC  Thank you for allowing pharmacy to participate in this patient's care.  Reatha Harps, PharmD PGY2 Pharmacy Resident 01/29/2022 6:29 AM Check [email or web address] for unit specific pharmacy number

## 2022-01-29 NOTE — Progress Notes (Addendum)
ONCOLOGY Hospitalization: CHF with cardiogenic shock with severe pulmonary hypertension resulting from right ventricular failure  Prior history: Left breast cancer: Triple negative stage IIIc diagnosed 10/01/2020 treated with neoadjuvant chemotherapy and mastectomy.  Recent scans: CT angiogram chest: 01/25/2022: New mediastinal, hilar, lower cervical and right axillary lymphadenopathy and new sclerotic T4 lesion.  Treatment plan for breast cancer: Obtain a biopsy of the right axillary lymph node Bone scan to look for extent of distant metastatic disease in the bones Completion CT abdomen and pelvis  Prognosis: Patient had excellent performance status prior to the current episode of shortness of breath over the past 2 to 3 weeks.  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 Even if we diagnose her with distant metastatic disease, based on the CT chest the burden of disease is not huge.  The only drawback is that it is triple negative breast cancer.  We believe that she could be treated with effective treatments and could have a reasonably long life expectancy if the cardiac issues improve.   Addendum Patient was seen and examined and labs reviewed  RV failure: Unclear etiology. No PE on CT Angio. With her H/O crohns disease and positive ANA and SSA, immune mediated cardiomyopathy could be a consideration Ideally this is diagnosed with a cardiac biopsy. -However, we can start steroids and see if she responds - Will check for pituitary insufficiency with TSH, ACTH and cortisol levels  2. Thrombocytopenia: Likely drug induced vs ITP We can watch and monitor for now  3. Breast Cancer: Further steps including Axillary LN biopsy as an outpatient.  4. Renal Failure: and Elevated LFTs: Due to cardiogenic shock  Will follow

## 2022-01-30 ENCOUNTER — Inpatient Hospital Stay (HOSPITAL_COMMUNITY): Payer: Federal, State, Local not specified - PPO

## 2022-01-30 DIAGNOSIS — N179 Acute kidney failure, unspecified: Secondary | ICD-10-CM | POA: Diagnosis not present

## 2022-01-30 DIAGNOSIS — J9601 Acute respiratory failure with hypoxia: Secondary | ICD-10-CM | POA: Diagnosis not present

## 2022-01-30 DIAGNOSIS — I509 Heart failure, unspecified: Secondary | ICD-10-CM | POA: Diagnosis not present

## 2022-01-30 DIAGNOSIS — R570 Cardiogenic shock: Secondary | ICD-10-CM | POA: Diagnosis not present

## 2022-01-30 LAB — COMPREHENSIVE METABOLIC PANEL
ALT: 284 U/L — ABNORMAL HIGH (ref 0–44)
AST: 229 U/L — ABNORMAL HIGH (ref 15–41)
Albumin: 3.2 g/dL — ABNORMAL LOW (ref 3.5–5.0)
Alkaline Phosphatase: 125 U/L (ref 38–126)
Anion gap: 11 (ref 5–15)
BUN: 35 mg/dL — ABNORMAL HIGH (ref 6–20)
CO2: 28 mmol/L (ref 22–32)
Calcium: 8.8 mg/dL — ABNORMAL LOW (ref 8.9–10.3)
Chloride: 96 mmol/L — ABNORMAL LOW (ref 98–111)
Creatinine, Ser: 1.14 mg/dL — ABNORMAL HIGH (ref 0.44–1.00)
GFR, Estimated: 55 mL/min — ABNORMAL LOW (ref >60)
Glucose, Bld: 154 mg/dL — ABNORMAL HIGH (ref 70–99)
Potassium: 3.5 mmol/L (ref 3.5–5.1)
Sodium: 135 mmol/L (ref 135–145)
Total Bilirubin: 1.3 mg/dL — ABNORMAL HIGH (ref 0.3–1.2)
Total Protein: 6.6 g/dL (ref 6.5–8.1)

## 2022-01-30 LAB — CBC
HCT: 35.6 % — ABNORMAL LOW (ref 36.0–46.0)
Hemoglobin: 12.1 g/dL (ref 12.0–15.0)
MCH: 29.9 pg (ref 26.0–34.0)
MCHC: 34 g/dL (ref 30.0–36.0)
MCV: 87.9 fL (ref 80.0–100.0)
Platelets: 52 10*3/uL — ABNORMAL LOW (ref 150–400)
RBC: 4.05 MIL/uL (ref 3.87–5.11)
RDW: 17.2 % — ABNORMAL HIGH (ref 11.5–15.5)
WBC: 8.8 10*3/uL (ref 4.0–10.5)
nRBC: 1.1 % — ABNORMAL HIGH (ref 0.0–0.2)

## 2022-01-30 LAB — COOXEMETRY PANEL
Carboxyhemoglobin: 1 % (ref 0.5–1.5)
Methemoglobin: <0.7 % (ref 0.0–1.5)
O2 Saturation: 56.4 %
Total hemoglobin: 12.4 g/dL (ref 12.0–16.0)

## 2022-01-30 LAB — ACTH: C206 ACTH: 6.2 pg/mL — ABNORMAL LOW (ref 7.2–63.3)

## 2022-01-30 MED ORDER — METOLAZONE 2.5 MG PO TABS
2.5000 mg | ORAL_TABLET | Freq: Once | ORAL | Status: AC
  Administered 2022-01-30: 2.5 mg via ORAL
  Filled 2022-01-30: qty 1

## 2022-01-30 MED ORDER — DIGOXIN 125 MCG PO TABS
0.1250 mg | ORAL_TABLET | Freq: Every day | ORAL | Status: DC
  Administered 2022-01-30: 0.125 mg via ORAL
  Filled 2022-01-30: qty 1

## 2022-01-30 MED ORDER — POTASSIUM CHLORIDE CRYS ER 20 MEQ PO TBCR
40.0000 meq | EXTENDED_RELEASE_TABLET | Freq: Three times a day (TID) | ORAL | Status: AC
  Administered 2022-01-30 (×3): 40 meq via ORAL
  Filled 2022-01-30 (×3): qty 2

## 2022-01-30 MED ORDER — MIDODRINE HCL 5 MG PO TABS
5.0000 mg | ORAL_TABLET | Freq: Three times a day (TID) | ORAL | Status: DC
  Administered 2022-01-30 – 2022-02-01 (×8): 5 mg via ORAL
  Filled 2022-01-30 (×8): qty 1

## 2022-01-30 MED ORDER — SUMATRIPTAN SUCCINATE 50 MG PO TABS
50.0000 mg | ORAL_TABLET | ORAL | Status: DC | PRN
  Administered 2022-01-30: 50 mg via ORAL
  Filled 2022-01-30 (×2): qty 1

## 2022-01-30 NOTE — Progress Notes (Signed)
NAME:  Rita Cole, MRN:  [id number], DOB:  01/28/1961, LOS: 3 ADMISSION DATE:  02/11/2022, CONSULTATION DATE:  8/2 REFERRING MD:  Neena Rhymes FOR CONSULT: Medical management   History of Present Illness:  Rita Cole is a 61 y.o. F with a PMX of stage IIIc Breast cancer L breast, ER negative s/p chemo (carboplatin, paclitaxel, pembrolizumab) radiation and mastectomy, HTN, DM presented to ED 8/2 with SOB.  SOB had started a few weeks ago and was progressive such that on 7/31 pt presented to [facility] Ed in this setting. Associated chest discomfort. CTA chest r/o PE at that time but did have new RA and RV enlargement, as well as LUL consolidation, and extensive lymphadenopathy. She was dc home    Presented to Cancer center 8/1 and noted to be hypoxic, and was ordered home Oxygen, as well as an ECHO with concern for pHTN.    Pt then presented to Cancer center 8/2 for IVF and was found to be hypoxic and in respiratory distress during infusion. Rapid response was called and pt was transported to ED for Hypoxic, Hypotension, Resp Distress. In ED she was started on NE and O2 titrated. Labs revealed a new AKI and acute liver injury. There was concern for cardiogenic shock, so pt transferred [facility] ED - [facility] ED, where Adv HF was consulted for suspected cardiogenic shock.   STAT ECHO obtained and shows marked RV failure -- D shaped septum, severely reduced RV systolic fxn, severely elevated RV, mildly elevated PASP, RVSP 43.1  Patient with right sided cardiogenic shock and was planned for urgent RHC + swan placement    Admitting to Cardiology service with Adv HF and PCCM consulted in this setting   Pertinent  Medical History  She has a past medical history of L breast cancer (5/22, treated w/chemo and radition, s/p mastectomy), crohn's, GERD, HTN.  Significant Hospital Events: Including procedures, antibiotic start and stop dates in addition to other pertinent events   8/2 rapid response  at cancer center for hypoxia hypotension resp distress. [facility] eval, found to have acute right sided heart failure with cardiogenic shock. Admit to cards, Adv HF and PCCM consult. Cath lab for [place] + swan.  Interim History / Subjective:  Patient stated feeling much better, breathing has improved, appetite is back Remained net negative, made 3.6 L of urine, weight is down 3 pounds  Objective   Blood pressure 109/67, pulse (!) 122, temperature 98.2 F (36.8 C), resp. rate (!) 23, height '5\' 6"'$  (1.676 m), weight 94.2 kg, SpO2 94 %. PAP: (15-51)/(12-47) 27/24 CVP:  [3 mmHg-23 mmHg] 11 mmHg CO:  [3.7 L/min] 3.7 L/min CI:  [2.9 L/min/m2] 2.9 L/min/m2      Intake/Output Summary (Last 24 hours) at 01/30/2022 0911 Last data filed at 01/30/2022 0800 Gross per 24 hour  Intake 757.43 ml  Output 4465 ml  Net -3707.57 ml   Filed Weights   01/28/22 0700 01/29/22 0500 01/30/22 0458  Weight: 94.1 kg 92.7 kg 94.2 kg    Examination: Physical exam: General: Acute on chronically ill-appearing obese female, lying on the bed.  On high flow nasal cannula oxygen HEENT: [place]/AT, eyes anicteric.  moist mucus membranes Neuro: Alert, awake following commands Chest: Faint bilateral basal crackles, no wheezes or rhonchi Heart: Regular rhythm, tachycardic no murmurs or gallops Abdomen: Soft, nontender, nondistended, bowel sounds present Skin: No rash  Urine output 3.6 L Coox 56% Serum creatinine down to 1.1  Resolved Hospital Problem list   Lactic acidosis  Assessment & Plan:  Cardiogenic shock secondary to isolated RV failure Pulmonary artery hypertension Demand cardiac ischemia ECHO: Showing normal LV systolic function, hypokinetic and volume overloaded RV Questionable venoocclusive cause of pulmonary hypertension versus pembrolizumab therapy that she was on Heart failure team is following Currently on dobutamine 7.5 Still requiring Levophed though requirement has  decreased to 5 mics Continue IV Lasix infusion at 12 mg/h Monitor intake and output On IV heparin infusion for possible pulmonary venoocclusive disease Will need HRCT/VQ scan once clinically stable Trend troponins  Acute respiratory failure with hypoxia Secondary to cardiogenic shock and pulmonary edema On high flow nasal cannula oxygen Titrate FiO2 goal SPO2 92-95%  AKI Suspect cardio renal , baseline creatinine is around 1.1 She presented with serum creatinine of 4.2 Now serum creatinine is back to baseline of 1.1 Avoid nephrotoxic agents Monitor intake and output  Shock liver in the setting of cardiogenic shock LFTs continue to improve with improvement in cardiogenic shock Closely monitor   Thrombocytopenia, could be related to chemotherapy versus acute illness Platelets count trended down to 52 K Watch for signs of bleeding  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 (5/22, treated w/chemo and radition, s/p mastectomy) carboplatin, paclitaxel, pembrolizumab Repeat CT showed T4 sclerotic lesion and axillary lymphadenopathy Oncology is following, plan for axillary lymph node biopsy once stable  Hypertension Hold antihypertensives  Best Practice (right click and "Reselect all SmartList Selections" daily)   Diet/type: Regular consistency DVT prophylaxis: systemic heparin GI prophylaxis: PPI Lines: Central line and yes and it is still needed Foley:  Yes, and it is still needed Code Status:  full code Last date of multidisciplinary goals of care discussion [Per primary team]  Labs   CBC: Recent Labs  Lab 02/09/2022 1132 01/31/2022 1730 02/06/2022 1920 02/02/2022 2042 01/28/22 0255 01/29/22 0544 01/29/22 1433 01/30/22 0432  WBC 9.5  --  9.8  --  11.2* 9.6 8.6 8.8  NEUTROABS 7.7  --   --   --   --   --   --   --   HGB 12.7   < > 12.2 12.6 12.3 11.6* 11.3* 12.1  HCT 39.7   < > 37.9 37.0 36.6 33.9* 33.8* 35.6*  MCV 90.6  --  89.6  --  88.0 86.0 87.6 87.9  PLT 128*  --   106*  --  103* 69* 61* 52*   < > = values in this interval not displayed.    Basic Metabolic Panel: Recent Labs  Lab 02/17/2022 1920 01/28/2022 2042 01/28/22 0255 01/29/22 0544 01/29/22 1433 01/30/22 0432  NA 137 137 139 133* 135 135  K 4.9 4.7 3.8 3.6 3.4* 3.5  CL 103  --  100 100 100 96*  CO2 16*  --  21* 21* 24 28  GLUCOSE 102*  --  157* 139* 133* 154*  BUN 68*  --  70* 45* 40* 35*  CREATININE 4.22*  --  3.22* 1.49* 1.34* 1.14*  CALCIUM 8.7*  --  8.8* 8.2* 8.3* 8.8*  MG 2.7*  --  2.5* 1.9  --   --    GFR: Estimated Creatinine Clearance: 60.7 mL/min (A) (by C-G formula based on SCr of 1.14 mg/dL (H)). Recent Labs  Lab 01/31/2022 1859 02/05/2022 1920 01/28/22 0255 01/28/22 1016 01/29/22 0544 01/29/22 0737 01/29/22 1433 01/30/22 0432  WBC  --    < > 11.2*  --  9.6  --  8.6 8.8  LATICACIDVEN 5.2*  --  4.2* 3.2*  --  2.4*  --   --    < > = values in this interval not displayed.    Liver Function Tests: Recent Labs  Lab 01/25/22 1024 02/10/2022 1132 01/28/22 0255 01/29/22 0544 01/30/22 0432  AST 99* 181* 514* 282* 229*  ALT 90* 153* 349* 315* 284*  ALKPHOS 103 126 132* 134* 125  BILITOT 0.8 1.0 1.4* 1.4* 1.3*  PROT 7.5 6.8 6.5 6.3* 6.6  ALBUMIN 3.8 3.5 3.4* 3.2* 3.2*   No results for input(s): "LIPASE", "AMYLASE" in the last 168 hours. No results for input(s): "AMMONIA" in the last 168 hours.  ABG    Component Value Date/Time   PHART 7.324 (L) 02/18/2022 2042   PCO2ART 30.8 (L) 02/24/2022 2042   PO2ART 76 (L) 01/30/2022 2042   HCO3 16.0 (L) 01/26/2022 2042   TCO2 17 (L) 02/20/2022 2042   ACIDBASEDEF 9.0 (H) 02/15/2022 2042   O2SAT 56.4 01/30/2022 0432     Coagulation Profile: No results for input(s): "INR", "PROTIME" in the last 168 hours.  Cardiac Enzymes: No results for input(s): "CKTOTAL", "CKMB", "CKMBINDEX", "TROPONINI" in the last 168 hours.  HbA1C: Hgb A1c MFr Bld  Date/Time Value Ref Range Status  05/11/2021 09:35 AM 5.8 (H) 4.8 - 5.6 % Final     Comment:    (NOTE) Pre diabetes:          5.7%-6.4%  Diabetes:              >6.4%  Glycemic control for   <7.0% adults with diabetes     CBG: Recent Labs  Lab 02/10/2022 1126 02/20/2022 1919  GLUCAP 81 93   Critical care time:     Total critical care time: 32 minutes  Performed by: [place] care time was exclusive of separately billable procedures and treating other patients.   Critical care was necessary to treat or prevent imminent or life-threatening deterioration.   Critical care was time spent personally by me on the following activities: development of treatment plan with patient and/or surrogate as well as nursing, discussions with consultants, evaluation of patient's response to treatment, examination of patient, obtaining history from patient or surrogate, ordering and performing treatments and interventions, ordering and review of laboratory studies, ordering and review of radiographic studies, pulse oximetry and re-evaluation of patient's condition.   Jacky Kindle, MD [place] [facility] See Amion for pager If no response to pager, please call [phone] until 7pm After 7pm, Please call E-link [phone]

## 2022-01-30 NOTE — Progress Notes (Signed)
Patient ID: Rita Cole, female   DOB: 1961/05/25, 61 y.o.   MRN: [id number]      Advanced Heart Failure Rounding Note  PCP-Cardiologist: None   Subjective:    This morning, patient is on dobutamine 7.5 + NE 5 stable MAP. CVP 10.  Creatinine 4.22 => 3.22 => 1.49 => 1.14.  I/Os net negative 3614 cc.    Feels better, eating.   Plts 69K => 51K.   ANA+, SSA+   Started on Solumedrol yesterday.   Swan:  RA 10 PA 20/16 CI 2.9 Co-ox 56%  RHC (8/2): Hemodynamics (mmHg) RA mean 19 RV 53/19 PA 47/22, mean 32 PCWP mean 12 Oxygen saturations: PA 47% AO 97% Cardiac Output (Fick) 3.1  Cardiac Index (Fick) 1.54 PVR 6.45 WU PAPi 1.3   Echo: EF 55-60%, small compressed LV, D-shaped septum, severe RV dilation with severely decreased systolic function, IVC dilated.   Objective:   Weight Range: 94.2 kg Body mass index is 33.52 kg/m.   Vital Signs:   Temp:  [98.1 F (36.7 C)-99.9 F (37.7 C)] 98.1 F (36.7 C) (08/05 0615) Pulse Rate:  [115-140] 124 (08/05 0615) Resp:  [16-27] 19 (08/05 0615) SpO2:  [86 %-97 %] 96 % (08/05 0615) Arterial Line BP: (97-140)/(53-76) 114/64 (08/05 0615) Weight:  [94.2 kg] 94.2 kg (08/05 0458) Last BM Date : 02/14/2022  Weight change: Filed Weights   01/28/22 0700 01/29/22 0500 01/30/22 0458  Weight: 94.1 kg 92.7 kg 94.2 kg    Intake/Output:   Intake/Output Summary (Last 24 hours) at 01/30/2022 0740 Last data filed at 01/30/2022 0600 Gross per 24 hour  Intake 701.42 ml  Output 4315 ml  Net -3613.58 ml      Physical Exam    General: NAD Neck: JVP 10-12, no thyromegaly or thyroid nodule.  Lungs: Clear to auscultation bilaterally with normal respiratory effort. CV: Nondisplaced PMI.  Heart tachy, regular S1/S2, no S3/S4, no murmur.  1+ ankle edema.  Abdomen: Soft, nontender, no hepatosplenomegaly, no distention.  Skin: Intact without lesions or rashes.  Neurologic: Alert and oriented x 3.  Psych: Normal affect. Extremities:  No clubbing or cyanosis.  HEENT: Normal.    Telemetry   NSR 120 (personally reviewed)   Labs    CBC Recent Labs    01/26/2022 1132 02/12/2022 1730 01/29/22 1433 01/30/22 0432  WBC 9.5   < > 8.6 8.8  NEUTROABS 7.7  --   --   --   HGB 12.7   < > 11.3* 12.1  HCT 39.7   < > 33.8* 35.6*  MCV 90.6   < > 87.6 87.9  PLT 128*   < > 61* 52*   < > = values in this interval not displayed.   Basic Metabolic Panel Recent Labs    01/28/22 0255 01/29/22 0544 01/29/22 1433 01/30/22 0432  NA 139 133* 135 135  K 3.8 3.6 3.4* 3.5  CL 100 100 100 96*  CO2 21* 21* 24 28  GLUCOSE 157* 139* 133* 154*  BUN 70* 45* 40* 35*  CREATININE 3.22* 1.49* 1.34* 1.14*  CALCIUM 8.8* 8.2* 8.3* 8.8*  MG 2.5* 1.9  --   --    Liver Function Tests Recent Labs    01/29/22 0544 01/30/22 0432  AST 282* 229*  ALT 315* 284*  ALKPHOS 134* 125  BILITOT 1.4* 1.3*  PROT 6.3* 6.6  ALBUMIN 3.2* 3.2*   No results for input(s): "LIPASE", "AMYLASE" in the last 72 hours. Cardiac Enzymes No results  for input(s): "CKTOTAL", "CKMB", "CKMBINDEX", "TROPONINI" in the last 72 hours.  BNP: BNP (last 3 results) Recent Labs    02/18/2022 1132  BNP 750.4*    ProBNP (last 3 results) No results for input(s): "PROBNP" in the last 8760 hours.   D-Dimer No results for input(s): "DDIMER" in the last 72 hours. Hemoglobin A1C No results for input(s): "HGBA1C" in the last 72 hours. Fasting Lipid Panel No results for input(s): "CHOL", "HDL", "LDLCALC", "TRIG", "CHOLHDL", "LDLDIRECT" in the last 72 hours. Thyroid Function Tests Recent Labs    01/29/22 1433  TSH 3.834    Other results:   Imaging    DG CHEST PORT 1 VIEW  Result Date: 01/29/2022 CLINICAL DATA:  Hypotension, CHF, very weak EXAM: PORTABLE CHEST 1 VIEW COMPARISON:  Portable exam 0748 hours compared to 01/28/2022 FINDINGS: RIGHT jugular Swan-Ganz catheter with tip projecting over RIGHT lower lobe pulmonary artery. Enlargement of cardiac silhouette.  Stable mediastinal contours. Persistent peripheral infiltrate LEFT upper lobe and RIGHT basilar atelectasis. Linear subsegmental atelectasis in retrocardiac LEFT lower lobe. No pleural effusion or pneumothorax. IMPRESSION: No change in LEFT upper lobe consolidation and bibasilar atelectasis. Electronically Signed   By: Lavonia Dana M.D.   On: 01/29/2022 08:16     Medications:     Scheduled Medications:  Chlorhexidine Gluconate Cloth  6 each Topical Daily   Chlorhexidine Gluconate Cloth  6 each Topical Daily   digoxin  0.125 mg Oral Daily   enoxaparin (LOVENOX) injection  40 mg Subcutaneous Q24H   lidocaine  1 patch Transdermal QHS   methylPREDNISolone (SOLU-MEDROL) injection  125 mg Intravenous Daily   metolazone  2.5 mg Oral Once   midodrine  5 mg Oral TID WC   pantoprazole  40 mg Oral Daily   potassium chloride  40 mEq Oral TID   sodium chloride flush  10-40 mL Intracatheter Q12H   sodium chloride flush  3 mL Intravenous Q12H   sodium chloride flush  3 mL Intravenous Q12H    Infusions:  sodium chloride     sodium chloride     sodium chloride     sodium chloride     sodium chloride     DOBUTamine 7.5 mcg/kg/min (01/30/22 0600)   furosemide (LASIX) 200 mg in dextrose 5 % 100 mL (2 mg/mL) infusion 12 mg/hr (01/30/22 0717)   norepinephrine (LEVOPHED) Adult infusion 5 mcg/min (01/30/22 0600)    PRN Medications: sodium chloride, sodium chloride, Place/Maintain arterial line **AND** sodium chloride, Place/Maintain arterial line **AND** sodium chloride, acetaminophen, fentaNYL (SUBLIMAZE) injection, ondansetron (ZOFRAN) IV, sodium chloride flush, sodium chloride flush, sodium chloride flush, traMADol   Assessment/Plan   1. Shock: Suspect cardiogenic due to RV failure.  Echo showed EF 55-60%, small compressed LV, D-shaped septum, severe RV dilation with severely decreased systolic function, IVC dilated.  Most recent previous echo in 7/22 was normal. RHC showed elevated RA pressure/RV  failure with low PAPi and CI but normal PCWP.  There was moderate PAH with PVR 6.45.  Cause of RV failure is uncertain. CTA chest 7/31 did not show evidence for acute pulmonary embolus.  Elevated PVR suggests that there could be a pulmonary vascular issue causing the RV failure, but prior echo in 7/22 did not show signs of this.  Cannot fully rule out chronic PE without V/Q scan, would also consider pulmonary veno-occlusive disease in the setting of prior chemotherapy.  Also consider microthrombi from tumor spread.  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  In the case report, the ICI was thought to trigger an SLE-like picture with the onset of PAH (+ANA) => patient found to have +ANA and +SSA.  She is currently on Norepinephrine down to 5 with dobutamine 7.5.  MAP stable, CI 2.9, Co=ox 56%.  CVP down to 10, good UOP with creatinine down to 1.14. Sinus tachy noted.  - Continue dobutamine 7.5 mcg/kg/min for RV support.     - Will try to wean off NE and onto midodrine (start 5 tid).  - When off NE would like to start sildenafil 20 tid.   - Continue Lasix gtt 12 mg/hr and give metolazone 2.5 x 1. Need significant volume off to decrease RV size and thus decompress LV and improve hemodynamics => probably 1 more day of aggressive diuresis based on fall in CVP.  - I do not think she would be a good candidate for RV support device (Protek Duo or Impella RP).  She has elevated PVR suggesting pulmonary vasoconstriction that may respond poorly to increased flow into the PA.  - Will need V/Q scan to look for chronic PEs and high resolution CT chest to look for signs of PVOD when more stable and diuresed.  - Started on methylprednisolone x 5 days with elevated ANA and SSA.  2. Elevated troponin: Suspect demand ischemia from shock  and RV failure.  Mild elevation without significant trend.  3. Headache: Severe at times, constant for several days.  CT head negative.  4. AKI: Creatinine up to 4.22 from 1.13 in setting of shock.  Improving with hemodynamic support. Down to 1.14 today.  5. Elevated LFTs: New, suspect shock liver. Trending down.  6. Breast cancer: On left, diagnosed in 2022, had left mastectomy, chemotherapy, and XRT.  She had carboplatin, pembrolizumab, and paclitaxel.  CT chest in 7/23 was concerning for metastatic disease with T4 lesion and extensive lymphadenopathy. Oncology plan would be lymph node biopsy eventually, think she would have reasonable short-term prognosis as CT burden of potential metastatic disease would not be high.  7. Thrombocytopenia: ?Due to critical illness versus due to heparin (HIT, but seems early).  - Off heparin gtt as she does not have a definitive indication.  - Can use Lovenox for DVT prophylaxis.    CRITICAL CARE Performed by: Loralie Champagne  Total critical care time: 40 minutes  Critical care time was exclusive of separately billable procedures and treating other patients.  Critical care was necessary to treat or prevent imminent or life-threatening deterioration.  Critical care was time spent personally by me on the following activities: development of treatment plan with patient and/or surrogate as well as nursing, discussions with consultants, evaluation of patient's response to treatment, examination of patient, obtaining history from patient or surrogate, ordering and performing treatments and interventions, ordering and review of laboratory studies, ordering and review of radiographic studies, pulse oximetry and re-evaluation of patient's condition.   Length of Stay: 3  Loralie Champagne, MD  01/30/2022, 7:40 AM  Advanced Heart Failure Team Pager [phone] (M-F; 7a - 5p)  Please contact [facility] for night-coverage after hours (5p -7a ) and weekends on [email or web address]

## 2022-01-30 NOTE — Progress Notes (Signed)
eLink Physician-Brief Progress Note Patient Name: Rita Cole DOB: 12-12-1960 MRN: [id number]   Date of Service  01/30/2022  HPI/Events of Note  Patient with migraine type headache, normal head CT 2 days ago.  eICU Interventions  Trial of oral Imitrex ordered.        [name] Ingra Rother 01/30/2022, 11:30 PM

## 2022-01-31 DIAGNOSIS — N179 Acute kidney failure, unspecified: Secondary | ICD-10-CM | POA: Diagnosis not present

## 2022-01-31 DIAGNOSIS — I509 Heart failure, unspecified: Secondary | ICD-10-CM | POA: Diagnosis not present

## 2022-01-31 DIAGNOSIS — R570 Cardiogenic shock: Secondary | ICD-10-CM | POA: Diagnosis not present

## 2022-01-31 DIAGNOSIS — J9601 Acute respiratory failure with hypoxia: Secondary | ICD-10-CM | POA: Diagnosis not present

## 2022-01-31 DIAGNOSIS — I50810 Right heart failure, unspecified: Secondary | ICD-10-CM | POA: Diagnosis not present

## 2022-01-31 LAB — CBC
HCT: 37.2 % (ref 36.0–46.0)
Hemoglobin: 12.3 g/dL (ref 12.0–15.0)
MCH: 29.1 pg (ref 26.0–34.0)
MCHC: 33.1 g/dL (ref 30.0–36.0)
MCV: 87.9 fL (ref 80.0–100.0)
Platelets: 55 10*3/uL — ABNORMAL LOW (ref 150–400)
RBC: 4.23 MIL/uL (ref 3.87–5.11)
RDW: 18.2 % — ABNORMAL HIGH (ref 11.5–15.5)
WBC: 15.7 10*3/uL — ABNORMAL HIGH (ref 4.0–10.5)
nRBC: 0.7 % — ABNORMAL HIGH (ref 0.0–0.2)

## 2022-01-31 LAB — COOXEMETRY PANEL
Carboxyhemoglobin: 0.7 % (ref 0.5–1.5)
Carboxyhemoglobin: 0.5 % (ref 0.5–1.5)
Carboxyhemoglobin: 0.6 % (ref 0.5–1.5)
Methemoglobin: <0.7 % (ref 0.0–1.5)
Methemoglobin: <0.7 % (ref 0.0–1.5)
Methemoglobin: <0.7 % (ref 0.0–1.5)
O2 Saturation: 47.6 %
O2 Saturation: 41.3 %
O2 Saturation: 42.5 %
Total hemoglobin: 12.2 g/dL (ref 12.0–16.0)
Total hemoglobin: 12.5 g/dL (ref 12.0–16.0)
Total hemoglobin: 12.6 g/dL (ref 12.0–16.0)

## 2022-01-31 LAB — COMPREHENSIVE METABOLIC PANEL
ALT: 250 U/L — ABNORMAL HIGH (ref 0–44)
AST: 207 U/L — ABNORMAL HIGH (ref 15–41)
Albumin: 3.5 g/dL (ref 3.5–5.0)
Alkaline Phosphatase: 135 U/L — ABNORMAL HIGH (ref 38–126)
Anion gap: 13 (ref 5–15)
BUN: 48 mg/dL — ABNORMAL HIGH (ref 6–20)
CO2: 28 mmol/L (ref 22–32)
Calcium: 9.1 mg/dL (ref 8.9–10.3)
Chloride: 93 mmol/L — ABNORMAL LOW (ref 98–111)
Creatinine, Ser: 1.55 mg/dL — ABNORMAL HIGH (ref 0.44–1.00)
GFR, Estimated: 38 mL/min — ABNORMAL LOW (ref >60)
Glucose, Bld: 138 mg/dL — ABNORMAL HIGH (ref 70–99)
Potassium: 4.4 mmol/L (ref 3.5–5.1)
Sodium: 134 mmol/L — ABNORMAL LOW (ref 135–145)
Total Bilirubin: 2 mg/dL — ABNORMAL HIGH (ref 0.3–1.2)
Total Protein: 7 g/dL (ref 6.5–8.1)

## 2022-01-31 LAB — GLUCOSE, CAPILLARY
Glucose-Capillary: 166 mg/dL — ABNORMAL HIGH (ref 70–99)
Glucose-Capillary: 153 mg/dL — ABNORMAL HIGH (ref 70–99)
Glucose-Capillary: 187 mg/dL — ABNORMAL HIGH (ref 70–99)

## 2022-01-31 LAB — HEMOGLOBIN A1C
Hgb A1c MFr Bld: 6 % — ABNORMAL HIGH (ref 4.8–5.6)
Mean Plasma Glucose: 125.5 mg/dL

## 2022-01-31 LAB — LACTIC ACID, PLASMA
Lactic Acid, Venous: 4 mmol/L (ref 0.5–1.9)
Lactic Acid, Venous: 4.2 mmol/L (ref 0.5–1.9)

## 2022-01-31 LAB — POTASSIUM: Potassium: 4.1 mmol/L (ref 3.5–5.1)

## 2022-01-31 LAB — MAGNESIUM: Magnesium: 2.5 mg/dL — ABNORMAL HIGH (ref 1.7–2.4)

## 2022-01-31 MED ORDER — POTASSIUM CHLORIDE CRYS ER 20 MEQ PO TBCR
20.0000 meq | EXTENDED_RELEASE_TABLET | Freq: Once | ORAL | Status: AC
  Administered 2022-01-31: 20 meq via ORAL
  Filled 2022-01-31: qty 1

## 2022-01-31 MED ORDER — METOLAZONE 2.5 MG PO TABS
2.5000 mg | ORAL_TABLET | Freq: Once | ORAL | Status: AC
  Administered 2022-01-31: 2.5 mg via ORAL
  Filled 2022-01-31: qty 1

## 2022-01-31 MED ORDER — ORAL CARE MOUTH RINSE: 15.0000 mL | OROMUCOSAL | Status: DC | PRN

## 2022-01-31 MED ORDER — INSULIN ASPART 100 UNIT/ML IJ SOLN
0.0000 [IU] | Freq: Three times a day (TID) | INTRAMUSCULAR | Status: DC
  Administered 2022-01-31 (×2): 3 [IU] via SUBCUTANEOUS
  Administered 2022-02-01 – 2022-02-02 (×4): 2 [IU] via SUBCUTANEOUS
  Administered 2022-02-02: 3 [IU] via SUBCUTANEOUS

## 2022-01-31 MED ORDER — ENOXAPARIN SODIUM 30 MG/0.3ML IJ SOSY
30.0000 mg | PREFILLED_SYRINGE | INTRAMUSCULAR | Status: DC
  Administered 2022-01-31 – 2022-02-01 (×2): 30 mg via SUBCUTANEOUS
  Filled 2022-01-31 (×2): qty 0.3

## 2022-01-31 MED ORDER — MILRINONE LACTATE IN DEXTROSE 20-5 MG/100ML-% IV SOLN
0.5000 ug/kg/min | INTRAVENOUS | Status: DC
  Administered 2022-01-31: 0.5 ug/kg/min via INTRAVENOUS
  Administered 2022-01-31: 0.375 ug/kg/min via INTRAVENOUS
  Administered 2022-01-31 – 2022-02-02 (×7): 0.5 ug/kg/min via INTRAVENOUS
  Filled 2022-01-31 (×10): qty 100

## 2022-01-31 MED ORDER — INSULIN ASPART 100 UNIT/ML IJ SOLN: 0.0000 [IU] | Freq: Every day | INTRAMUSCULAR | Status: DC

## 2022-01-31 MED ORDER — MAGNESIUM SULFATE 2 GM/50ML IV SOLN
2.0000 g | Freq: Once | INTRAVENOUS | Status: AC
  Administered 2022-01-31: 2 g via INTRAVENOUS
  Filled 2022-01-31: qty 50

## 2022-01-31 NOTE — Progress Notes (Signed)
   01/31/22 0745  Clinical Encounter Type  Visited With Patient and family together  Visit Type Initial;Spiritual support  Referral From Nurse  Consult/Referral To Chaplain   Chaplain responded to a request from the patient for prayer. The patient, Davion and her mother Marcie Bal requested prayer for continued healing and reading of scripture. Dreidre a survivor of breast cancer believed she was doing all the things needed for healing but now finds herself on a journey of discovery that has lead to more necessary changes to her lifestyle. As she shared, I am still trying to understand. Dreidre has a strong support team rotating to be with as she learns what healing looks like going forward.   Danice Goltz [facility]  [phone]

## 2022-01-31 NOTE — Progress Notes (Signed)
NAME:  Rita Cole, MRN:  [id number], DOB:  1961/01/15, LOS: 4 ADMISSION DATE:  02/11/2022, CONSULTATION DATE:  8/2 REFERRING MD:  Neena Rhymes FOR CONSULT: Medical management   History of Present Illness:  Rita Cole is a 61 y.o. F with a PMX of stage IIIc Breast cancer L breast, ER negative s/p chemo (carboplatin, paclitaxel, pembrolizumab) radiation and mastectomy, HTN, DM presented to ED 8/2 with SOB.  SOB had started a few weeks ago and was progressive such that on 7/31 pt presented to [facility] Ed in this setting. Associated chest discomfort. CTA chest r/o PE at that time but did have new RA and RV enlargement, as well as LUL consolidation, and extensive lymphadenopathy. She was dc home    Presented to Cancer center 8/1 and noted to be hypoxic, and was ordered home Oxygen, as well as an ECHO with concern for pHTN.    Pt then presented to Cancer center 8/2 for IVF and was found to be hypoxic and in respiratory distress during infusion. Rapid response was called and pt was transported to ED for Hypoxic, Hypotension, Resp Distress. In ED she was started on NE and O2 titrated. Labs revealed a new AKI and acute liver injury. There was concern for cardiogenic shock, so pt transferred [facility] ED - [facility] ED, where Adv HF was consulted for suspected cardiogenic shock.   STAT ECHO obtained and shows marked RV failure -- D shaped septum, severely reduced RV systolic fxn, severely elevated RV, mildly elevated PASP, RVSP 43.1  Patient with right sided cardiogenic shock and was planned for urgent RHC + swan placement    Admitting to Cardiology service with Adv HF and PCCM consulted in this setting   Pertinent  Medical History  She has a past medical history of L breast cancer (5/22, treated w/chemo and radition, s/p mastectomy), crohn's, GERD, HTN.  Significant Hospital Events: Including procedures, antibiotic start and stop dates in addition to other pertinent events   8/2 rapid response  at cancer center for hypoxia hypotension resp distress. ED to ED transfer to [facility] for cards eval, found to have acute right sided heart failure with cardiogenic shock. Admit to cards, Adv HF and PCCM consult. Cath lab for [place] + swan.  Interim History / Subjective:  Feeling more short of breath even while talking  Remains on dobutamine 7.5 and norepinephrine of 8 CVP remains elevated to 14 Serum creatinine trended up to 1.5, lactate is going up again  Objective   Blood pressure 109/67, pulse (!) 115, temperature 97.7 F (36.5 C), temperature source Oral, resp. rate (!) 21, height '5\' 6"'$  (1.676 m), weight 91.5 kg, SpO2 97 %. PAP: (20-53)/(17-47) 53/47 CVP:  [5 mmHg-25 mmHg] 25 mmHg CO:  [2.7 L/min-2.9 L/min] 2.9 L/min CI:  [2.1 L/min/m2-2.2 L/min/m2] 2.2 L/min/m2      Intake/Output Summary (Last 24 hours) at 01/31/2022 1245 Last data filed at 01/31/2022 0600 Gross per 24 hour  Intake 499.99 ml  Output 2035 ml  Net -1535.01 ml   Filed Weights   01/29/22 0500 01/30/22 0458 01/31/22 0500  Weight: 92.7 kg 94.2 kg 91.5 kg    Examination: Physical exam: General: Acute on chronically ill-appearing obese female, lying on the bed.  On high flow nasal cannula oxygen HEENT: [place]/AT, eyes anicteric.  moist mucus membranes Neuro: Alert, awake following commands Chest: Faint bilateral basal crackles, no wheezes or rhonchi Heart: Regular rhythm, tachycardic no murmurs or gallops Abdomen: Soft, nontender, nondistended, bowel sounds present Skin: No  rash  Urine output 3.6 L Coox 48% Serum creatinine down to 1.5   Resolved Hospital Problem list     Assessment & Plan:  Cardiogenic shock secondary to isolated RV failure Pulmonary artery hypertension Demand cardiac ischemia Lactic acidosis ECHO: Showing normal LV systolic function, hypokinetic and volume overloaded RV Questionable venoocclusive cause of pulmonary hypertension versus pembrolizumab therapy that she was on Heart failure team is  following Currently on dobutamine 7.5 and Levophed at 8 mics Continue IV Lasix infusion at 12 mg/h Monitor intake and output On IV heparin infusion for possible pulmonary venoocclusive disease Will need HRCT/VQ scan once clinically stable Trend troponins Coox dropped to 48% Lactate started trending up Appreciate heart failure team recommendations  Acute respiratory failure with hypoxia Secondary to cardiogenic shock and pulmonary edema On high flow nasal cannula oxygen Titrate FiO2 goal SPO2 92-95%  AKI Suspect cardio renal syndrome She presented with serum creatinine of 4.2 Patient serum creatinine started trending back again now at 1.5 Avoid nephrotoxic agents Monitor intake and output  Shock liver in the setting of cardiogenic shock LFTs continue to improve with improvement in cardiogenic shock Closely monitor   Thrombocytopenia, could be related to chemotherapy versus acute illness Platelets count trended down to 76 K Watch for signs of bleeding  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 (5/22, treated w/chemo and radition, s/p mastectomy) carboplatin, paclitaxel, pembrolizumab Repeat CT showed T4 sclerotic lesion and axillary lymphadenopathy Oncology is following, plan for axillary lymph node biopsy once stable  Hypertension Hold antihypertensives while she is on vasopressor and inotrope support Hypervolemic hyponatremia Closely monitor electrolytes  Best Practice (right click and "Reselect all SmartList Selections" daily)   Diet/type: Regular consistency DVT prophylaxis: systemic heparin GI prophylaxis: PPI Lines: Central line and yes and it is still needed Foley:  Yes, and it is still needed Code Status:  full code Last date of multidisciplinary goals of care discussion [Per primary team]  Labs   CBC: Recent Labs  Lab 02/09/2022 1132 01/26/2022 1730 01/28/22 0255 01/29/22 0544 01/29/22 1433 01/30/22 0432 01/31/22 0442  WBC 9.5   < > 11.2* 9.6 8.6 8.8  15.7*  NEUTROABS 7.7  --   --   --   --   --   --   HGB 12.7   < > 12.3 11.6* 11.3* 12.1 12.3  HCT 39.7   < > 36.6 33.9* 33.8* 35.6* 37.2  MCV 90.6   < > 88.0 86.0 87.6 87.9 87.9  PLT 128*   < > 103* 69* 61* 52* 55*   < > = values in this interval not displayed.    Basic Metabolic Panel: Recent Labs  Lab 02/16/2022 1920 02/17/2022 2042 01/28/22 0255 01/29/22 0544 01/29/22 1433 01/30/22 0432 01/31/22 0442  NA 137   < > 139 133* 135 135 134*  K 4.9   < > 3.8 3.6 3.4* 3.5 4.4  CL 103  --  100 100 100 96* 93*  CO2 16*  --  21* 21* '24 28 28  '$ GLUCOSE 102*  --  157* 139* 133* 154* 138*  BUN 68*  --  70* 45* 40* 35* 48*  CREATININE 4.22*  --  3.22* 1.49* 1.34* 1.14* 1.55*  CALCIUM 8.7*  --  8.8* 8.2* 8.3* 8.8* 9.1  MG 2.7*  --  2.5* 1.9  --   --   --    < > = values in this interval not displayed.   GFR: Estimated Creatinine Clearance: 44 mL/min (A) (by C-G  formula based on SCr of 1.55 mg/dL (H)). Recent Labs  Lab 01/28/22 0255 01/28/22 1016 01/29/22 0544 01/29/22 0737 01/29/22 1433 01/30/22 0432 01/31/22 0442 01/31/22 0728  WBC 11.2*  --  9.6  --  8.6 8.8 15.7*  --   LATICACIDVEN 4.2* 3.2*  --  2.4*  --   --   --  4.0*    Liver Function Tests: Recent Labs  Lab 01/29/2022 1132 01/28/22 0255 01/29/22 0544 01/30/22 0432 01/31/22 0442  AST 181* 514* 282* 229* 207*  ALT 153* 349* 315* 284* 250*  ALKPHOS 126 132* 134* 125 135*  BILITOT 1.0 1.4* 1.4* 1.3* 2.0*  PROT 6.8 6.5 6.3* 6.6 7.0  ALBUMIN 3.5 3.4* 3.2* 3.2* 3.5   No results for input(s): "LIPASE", "AMYLASE" in the last 168 hours. No results for input(s): "AMMONIA" in the last 168 hours.  ABG    Component Value Date/Time   PHART 7.324 (L) 02/06/2022 2042   PCO2ART 30.8 (L) 01/29/2022 2042   PO2ART 76 (L) 02/06/2022 2042   HCO3 16.0 (L) 02/18/2022 2042   TCO2 17 (L) 02/14/2022 2042   ACIDBASEDEF 9.0 (H) 01/29/2022 2042   O2SAT 41.3 01/31/2022 0813     Coagulation Profile: No results for input(s): "INR",  "PROTIME" in the last 168 hours.  Cardiac Enzymes: No results for input(s): "CKTOTAL", "CKMB", "CKMBINDEX", "TROPONINI" in the last 168 hours.  HbA1C: Hgb A1c MFr Bld  Date/Time Value Ref Range Status  01/31/2022 04:42 AM 6.0 (H) 4.8 - 5.6 % Final    Comment:    (NOTE) Pre diabetes:          5.7%-6.4%  Diabetes:              >6.4%  Glycemic control for   <7.0% adults with diabetes   05/11/2021 09:35 AM 5.8 (H) 4.8 - 5.6 % Final    Comment:    (NOTE) Pre diabetes:          5.7%-6.4%  Diabetes:              >6.4%  Glycemic control for   <7.0% adults with diabetes     CBG: Recent Labs  Lab 02/15/2022 1126 01/28/2022 1919 01/31/22 1221  GLUCAP 81 93 166*   Critical care time:     Total critical care time: 30 minutes  Performed by: Norris care time was exclusive of separately billable procedures and treating other patients.   Critical care was necessary to treat or prevent imminent or life-threatening deterioration.   Critical care was time spent personally by me on the following activities: development of treatment plan with patient and/or surrogate as well as nursing, discussions with consultants, evaluation of patient's response to treatment, examination of patient, obtaining history from patient or surrogate, ordering and performing treatments and interventions, ordering and review of laboratory studies, ordering and review of radiographic studies, pulse oximetry and re-evaluation of patient's condition.   Jacky Kindle, MD [facility] See Amion for pager If no response to pager, please call [phone] until 7pm After 7pm, Please call E-link [phone]

## 2022-01-31 NOTE — Progress Notes (Signed)
Patient ID: Rita Cole, female   DOB: Nov 22, 1960, 61 y.o.   MRN: [id number]      Advanced Heart Failure Rounding Note  PCP-Cardiologist: None   Subjective:    This morning, patient is on dobutamine 7.5 + NE 8 stable MAP. CVP 14.  Creatinine 4.22 => 3.22 => 1.49 => 1.14 => 1.55.  I/Os net negative 1807 cc, weight down.    Still short of breath moving around.   Plts 69K => 51K => 55K.   ANA+, SSA+   Started on Solumedrol.   Swan:  RA 14 Mean PAP 39 CI 2.2 Co-ox 48%  RHC (8/2): Hemodynamics (mmHg) RA mean 19 RV 53/19 PA 47/22, mean 32 PCWP mean 12 Oxygen saturations: PA 47% AO 97% Cardiac Output (Fick) 3.1  Cardiac Index (Fick) 1.54 PVR 6.45 WU PAPi 1.3   Echo: EF 55-60%, small compressed LV, D-shaped septum, severe RV dilation with severely decreased systolic function, IVC dilated.   Objective:   Weight Range: 91.5 kg Body mass index is 32.56 kg/m.   Vital Signs:   Temp:  [97.7 F (36.5 C)-99.9 F (37.7 C)] 98.4 F (36.9 C) (08/06 0700) Pulse Rate:  [105-133] 115 (08/06 0600) Resp:  [13-23] 21 (08/06 0700) SpO2:  [90 %-98 %] 97 % (08/06 0600) Arterial Line BP: (81-132)/(47-77) 112/67 (08/06 0700) Weight:  [91.5 kg] 91.5 kg (08/06 0500) Last BM Date : 02/12/2022  Weight change: Filed Weights   01/29/22 0500 01/30/22 0458 01/31/22 0500  Weight: 92.7 kg 94.2 kg 91.5 kg    Intake/Output:   Intake/Output Summary (Last 24 hours) at 01/31/2022 0737 Last data filed at 01/31/2022 0600 Gross per 24 hour  Intake 852.86 ml  Output 2660 ml  Net -1807.14 ml      Physical Exam    General: NAD Neck: JVP 12-14, no thyromegaly or thyroid nodule.  Lungs: Clear to auscultation bilaterally with normal respiratory effort. CV: Nondisplaced PMI.  Heart tachy, regular S1/S2, no S3/S4, no murmur.  Trace ankle edema.  Abdomen: Soft, nontender, no hepatosplenomegaly, no distention.  Skin: Intact without lesions or rashes.  Neurologic: Alert and oriented x 3.   Psych: Normal affect. Extremities: No clubbing or cyanosis.  HEENT: Normal.   Telemetry   NSR 120 (personally reviewed)   Labs    CBC Recent Labs    01/30/22 0432 01/31/22 0442  WBC 8.8 15.7*  HGB 12.1 12.3  HCT 35.6* 37.2  MCV 87.9 87.9  PLT 52* 55*   Basic Metabolic Panel Recent Labs    01/29/22 0544 01/29/22 1433 01/30/22 0432 01/31/22 0442  NA 133*   < > 135 134*  K 3.6   < > 3.5 4.4  CL 100   < > 96* 93*  CO2 21*   < > 28 28  GLUCOSE 139*   < > 154* 138*  BUN 45*   < > 35* 48*  CREATININE 1.49*   < > 1.14* 1.55*  CALCIUM 8.2*   < > 8.8* 9.1  MG 1.9  --   --   --    < > = values in this interval not displayed.   Liver Function Tests Recent Labs    01/30/22 0432 01/31/22 0442  AST 229* 207*  ALT 284* 250*  ALKPHOS 125 135*  BILITOT 1.3* 2.0*  PROT 6.6 7.0  ALBUMIN 3.2* 3.5   No results for input(s): "LIPASE", "AMYLASE" in the last 72 hours. Cardiac Enzymes No results for input(s): "CKTOTAL", "CKMB", "CKMBINDEX", "TROPONINI" in  the last 72 hours.  BNP: BNP (last 3 results) Recent Labs    02/13/2022 1132  BNP 750.4*    ProBNP (last 3 results) No results for input(s): "PROBNP" in the last 8760 hours.   D-Dimer No results for input(s): "DDIMER" in the last 72 hours. Hemoglobin A1C No results for input(s): "HGBA1C" in the last 72 hours. Fasting Lipid Panel No results for input(s): "CHOL", "HDL", "LDLCALC", "TRIG", "CHOLHDL", "LDLDIRECT" in the last 72 hours. Thyroid Function Tests Recent Labs    01/29/22 1433  TSH 3.834    Other results:   Imaging    DG Chest 1 View  Result Date: 01/30/2022 CLINICAL DATA:  Central line placement. EXAM: CHEST  1 VIEW COMPARISON:  01/29/2022 and older studies. FINDINGS: Right internal jugular Swan-Ganz catheter has its tip right lower lobe or interlobar pulmonary artery, unchanged. No other evidence of a central line. Left peripheral mid to upper lung airspace opacity is unchanged. No new lung  abnormalities. No pneumothorax. IMPRESSION: 1. No interval change from the previous day's study. Electronically Signed   By: Lajean Manes M.D.   On: 01/30/2022 08:43     Medications:     Scheduled Medications:  Chlorhexidine Gluconate Cloth  6 each Topical Daily   Chlorhexidine Gluconate Cloth  6 each Topical Daily   enoxaparin (LOVENOX) injection  40 mg Subcutaneous Q24H   insulin aspart  0-15 Units Subcutaneous TID WC   insulin aspart  0-5 Units Subcutaneous QHS   lidocaine  1 patch Transdermal QHS   methylPREDNISolone (SOLU-MEDROL) injection  125 mg Intravenous Daily   metolazone  2.5 mg Oral Once   midodrine  5 mg Oral TID WC   pantoprazole  40 mg Oral Daily   potassium chloride  20 mEq Oral Once   sodium chloride flush  10-40 mL Intracatheter Q12H   sodium chloride flush  3 mL Intravenous Q12H   sodium chloride flush  3 mL Intravenous Q12H    Infusions:  sodium chloride     sodium chloride     sodium chloride     sodium chloride     sodium chloride     furosemide (LASIX) 200 mg in dextrose 5 % 100 mL (2 mg/mL) infusion 12 mg/hr (01/31/22 0600)   magnesium sulfate bolus IVPB     milrinone     norepinephrine (LEVOPHED) Adult infusion 8 mcg/min (01/31/22 0600)    PRN Medications: sodium chloride, sodium chloride, Place/Maintain arterial line **AND** sodium chloride, Place/Maintain arterial line **AND** sodium chloride, acetaminophen, fentaNYL (SUBLIMAZE) injection, ondansetron (ZOFRAN) IV, sodium chloride flush, sodium chloride flush, sodium chloride flush, traMADol   Assessment/Plan   1. Shock: Suspect cardiogenic due to RV failure.  Echo showed EF 55-60%, small compressed LV, D-shaped septum, severe RV dilation with severely decreased systolic function, IVC dilated.  Most recent previous echo in 7/22 was normal. RHC showed elevated RA pressure/RV failure with low PAPi and CI but normal PCWP.  There was moderate PAH with PVR 6.45.  Cause of RV failure is uncertain. CTA  chest 7/31 did not show evidence for acute pulmonary embolus.  Elevated PVR suggests that there could be a pulmonary vascular issue causing the RV failure, but prior echo in 7/22 did not show signs of this.  Cannot fully rule out chronic PE without V/Q scan, would also consider pulmonary veno-occlusive disease in the setting of prior chemotherapy.  Also consider microthrombi from tumor spread.  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  In the case report, the ICI was thought to trigger an SLE-like picture with the onset of PAH (+ANA) => patient found to have +ANA and +SSA.  She is currently on Norepinephrine 8 with dobutamine 7.5.  MAP stable, CI 2.2, Co-ox lower today at 48%.  CVP 14. Ongoing sinus tachy noted.  She is on Lasix gtt 12 and had metolazone yesterday, weight coming down.  - Will try transition from dobutamine to milrinone 0.375 for RV support.     - Continue NE as needed to maintain MAP.  - Continue midodrine 5 tid.  - When off NE would like to start sildenafil 20 tid.   - Continue Lasix gtt 12 mg/hr and give metolazone 2.5 x 1 again toady. Need significant volume off to decrease RV size and thus decompress LV and improve hemodynamics. Suspect CVP 10-11 will be the best place for her given RV failure.  - I do not think she would be a good candidate for RV support device (Protek Duo or Impella RP).  She has elevated PVR suggesting pulmonary vasoconstriction that may respond poorly to increased flow into the PA.  - Will need V/Q scan to look for chronic PEs and high resolution CT chest to look for signs of PVOD when more stable and diuresed.  - Started on methylprednisolone x 5 days with elevated ANA and SSA.  2. Elevated troponin: Suspect demand ischemia from shock and RV failure.  Mild elevation  without significant trend.  3. Headache: Severe at times, constant for several days.  CT head negative. Would avoid vasoconstrictor like Imitrex.  4. AKI: Creatinine up to 4.22 from 1.13 in setting of shock.  Mildly higher this morning at 1.55, follow closely.   5. Elevated LFTs: New, suspect shock liver. Trending down.  6. Breast cancer: On left, diagnosed in 2022, had left mastectomy, chemotherapy, and XRT.  She had carboplatin, pembrolizumab, and paclitaxel.  CT chest in 7/23 was concerning for metastatic disease with T4 lesion and extensive lymphadenopathy. Oncology plan would be lymph node biopsy eventually, think she would have reasonable short-term prognosis as CT burden of potential metastatic disease would not be high.  7. Thrombocytopenia: ?Due to critical illness versus due to heparin (HIT, but seems too early).  - Off heparin gtt as she does not have a definitive indication.  - Can use Lovenox for DVT prophylaxis.    CRITICAL CARE Performed by: Loralie Champagne  Total critical care time: 40 minutes  Critical care time was exclusive of separately billable procedures and treating other patients.  Critical care was necessary to treat or prevent imminent or life-threatening deterioration.  Critical care was time spent personally by me on the following activities: development of treatment plan with patient and/or surrogate as well as nursing, discussions with consultants, evaluation of patient's response to treatment, examination of patient, obtaining history from patient or surrogate, ordering and performing treatments and interventions, ordering and review of laboratory studies, ordering and review of radiographic studies, pulse oximetry and re-evaluation of patient's condition.   Length of Stay: 4  Loralie Champagne, MD  01/31/2022, 7:37 AM  Advanced Heart Failure Team Pager [phone] (M-F; 7a - 5p)  Please contact [facility] for night-coverage after hours (5p -7a ) and weekends on  [email or web address]

## 2022-01-31 NOTE — Plan of Care (Signed)

## 2022-02-01 ENCOUNTER — Inpatient Hospital Stay (HOSPITAL_COMMUNITY): Payer: Federal, State, Local not specified - PPO

## 2022-02-01 DIAGNOSIS — R609 Edema, unspecified: Secondary | ICD-10-CM | POA: Diagnosis not present

## 2022-02-01 DIAGNOSIS — N179 Acute kidney failure, unspecified: Secondary | ICD-10-CM | POA: Diagnosis not present

## 2022-02-01 DIAGNOSIS — R570 Cardiogenic shock: Secondary | ICD-10-CM | POA: Diagnosis not present

## 2022-02-01 LAB — GLUCOSE, CAPILLARY
Glucose-Capillary: 122 mg/dL — ABNORMAL HIGH (ref 70–99)
Glucose-Capillary: 96 mg/dL (ref 70–99)
Glucose-Capillary: 138 mg/dL — ABNORMAL HIGH (ref 70–99)
Glucose-Capillary: 144 mg/dL — ABNORMAL HIGH (ref 70–99)

## 2022-02-01 LAB — COMPREHENSIVE METABOLIC PANEL
ALT: 210 U/L — ABNORMAL HIGH (ref 0–44)
AST: 183 U/L — ABNORMAL HIGH (ref 15–41)
Albumin: 3.4 g/dL — ABNORMAL LOW (ref 3.5–5.0)
Alkaline Phosphatase: 139 U/L — ABNORMAL HIGH (ref 38–126)
Anion gap: 16 — ABNORMAL HIGH (ref 5–15)
BUN: 68 mg/dL — ABNORMAL HIGH (ref 6–20)
CO2: 24 mmol/L (ref 22–32)
Calcium: 9 mg/dL (ref 8.9–10.3)
Chloride: 93 mmol/L — ABNORMAL LOW (ref 98–111)
Creatinine, Ser: 1.93 mg/dL — ABNORMAL HIGH (ref 0.44–1.00)
GFR, Estimated: 29 mL/min — ABNORMAL LOW (ref >60)
Glucose, Bld: 118 mg/dL — ABNORMAL HIGH (ref 70–99)
Potassium: 4 mmol/L (ref 3.5–5.1)
Sodium: 133 mmol/L — ABNORMAL LOW (ref 135–145)
Total Bilirubin: 3 mg/dL — ABNORMAL HIGH (ref 0.3–1.2)
Total Protein: 6.6 g/dL (ref 6.5–8.1)

## 2022-02-01 LAB — COOXEMETRY PANEL
Carboxyhemoglobin: 1.3 % (ref 0.5–1.5)
Methemoglobin: <0.7 % (ref 0.0–1.5)
O2 Saturation: 46.3 %
Total hemoglobin: 12 g/dL (ref 12.0–16.0)

## 2022-02-01 LAB — CBC
HCT: 34.7 % — ABNORMAL LOW (ref 36.0–46.0)
Hemoglobin: 11.8 g/dL — ABNORMAL LOW (ref 12.0–15.0)
MCH: 29.2 pg (ref 26.0–34.0)
MCHC: 34 g/dL (ref 30.0–36.0)
MCV: 85.9 fL (ref 80.0–100.0)
Platelets: 49 10*3/uL — ABNORMAL LOW (ref 150–400)
RBC: 4.04 MIL/uL (ref 3.87–5.11)
RDW: 17.9 % — ABNORMAL HIGH (ref 11.5–15.5)
WBC: 17.3 10*3/uL — ABNORMAL HIGH (ref 4.0–10.5)
nRBC: 2.7 % — ABNORMAL HIGH (ref 0.0–0.2)

## 2022-02-01 LAB — LACTIC ACID, PLASMA: Lactic Acid, Venous: 3.4 mmol/L (ref 0.5–1.9)

## 2022-02-01 MED ORDER — STERILE WATER FOR INJECTION IJ SOLN: 50.0000 ng/kg/min | INTRAVENOUS | Status: DC

## 2022-02-01 NOTE — Progress Notes (Signed)
   02/01/22 1410  Clinical Encounter Type  Visited With Patient  Visit Type Follow-up  Referral From Chaplain  Consult/Referral To Chaplain   Chaplain stopped in during rounding to visit with patient as a follow up from Sunday.  The patient, Rita Cole was in good spirits continuing to focus on getting better. She shared that her daughter was on her way back from  holiday and continues to be well aware of the goings on of her family. Rita Cole is very thankful for her family and I can say with confidence they give her the energy to go on.  I wished her continued healing as I departed.   Danice Goltz [facility]  [phone]

## 2022-02-01 NOTE — Progress Notes (Signed)
Lower extremity venous bilateral study completed.   Please see CV Proc for preliminary results.   Ravi Tuccillo, RDMS, RVT  

## 2022-02-01 NOTE — Progress Notes (Signed)
NAME:  Rita Cole, MRN:  [id number], DOB:  1960/11/03, LOS: 5 ADMISSION DATE:  02/10/2022, CONSULTATION DATE:  8/2 REFERRING MD:  Neena Rhymes FOR CONSULT: Medical management   History of Present Illness:  Rita Cole is a 61 y.o. F with a PMX of stage IIIc Breast cancer L breast, ER negative s/p chemo (carboplatin, paclitaxel, pembrolizumab) radiation and mastectomy, HTN, DM presented to ED 8/2 with SOB.  SOB had started a few weeks ago and was progressive such that on 7/31 pt presented to [facility] in this setting. Associated chest discomfort. CTA chest r/o PE at that time but did have new RA and RV enlargement, as well as LUL consolidation, and extensive lymphadenopathy. She was dc home    Presented to Cancer center 8/1 and noted to be hypoxic, and was ordered home Oxygen, as well as an ECHO with concern for pHTN.    Pt then presented to Cancer center 8/2 for IVF and was found to be hypoxic and in respiratory distress during infusion. Rapid response was called and pt was transported to ED for Hypoxic, Hypotension, Resp Distress. In ED she was started on NE and O2 titrated. Labs revealed a new AKI and acute liver injury. There was concern for cardiogenic shock, so pt transferred [facility] ED - [facility] ED, where Adv HF was consulted for suspected cardiogenic shock.   STAT ECHO obtained and shows marked RV failure -- D shaped septum, severely reduced RV systolic fxn, severely elevated RV, mildly elevated PASP, RVSP 43.1  Patient with right sided cardiogenic shock and was planned for urgent RHC + swan placement    Admitting to Cardiology service with Adv HF and PCCM consulted in this setting   Pertinent  Medical History  She has a past medical history of L breast cancer (5/22, treated w/chemo and radition, s/p mastectomy), crohn's, GERD, HTN.  Significant Hospital Events: Including procedures, antibiotic start and stop dates in addition to other pertinent events   8/2 rapid response  at cancer center for hypoxia hypotension resp distress. ED to ED transfer to [facility] for cards eval, found to have acute right sided heart failure with cardiogenic shock. Admit to cards, Adv HF and PCCM consult. Cath lab for [place] + swan.  Interim History / Subjective:  Still weak, SOB with activity.  Objective   Blood pressure 106/62, pulse (!) 137, temperature 98.4 F (36.9 C), resp. rate (!) 22, height 5' 6"  (1.676 m), weight 91.9 kg, SpO2 93 %. PAP: (43-68)/(37-57) 50/44 CVP:  [8 mmHg-26 mmHg] 14 mmHg PCWP:  [20 mmHg] 20 mmHg CO:  [2.6 L/min-3.3 L/min] 2.9 L/min CI:  [2 L/min/m2-2.6 L/min/m2] 2.2 L/min/m2      Intake/Output Summary (Last 24 hours) at 02/01/2022 0755 Last data filed at 02/01/2022 0600 Gross per 24 hour  Intake 699.46 ml  Output 2880 ml  Net -2180.54 ml    Filed Weights   01/30/22 0458 01/31/22 0500 02/01/22 0607  Weight: 94.2 kg 91.5 kg 91.9 kg    Examination: Chronically ill appearing Loud P2 Trace edema Ext warm Moves all 4 ext to command  Coox 46% CBG ok BMP pending  Resolved Hospital Problem list     Assessment & Plan:  Decompensated RV failure with cardiorenal syndrome, congestive hepatopathy, cardiogenic shock, 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  Remains pressor and inotrope dependent. - Lasix and inotropes per CHF team -  Need eventual VQ, check LE duplex - Levophed to MAP 65 - Methylpred x 5 days then taper to 20m/kg/day, duration TBD - Inhaled vasodilator challenge. 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  Repeat CT late 7/31 concerning for metastases -Oncology is following, plan  for axillary lymph node biopsy once stable; may need to move this timeline up more to help GEagletalks   Best Practice (right click and "Reselect all SmartList Selections" daily)   Diet/type: Regular consistency DVT prophylaxis: systemic heparin GI prophylaxis: PPI Lines: Central line and yes and it is still needed Foley:  Yes, and it is still needed Code Status:  full code Last date of multidisciplinary goals of care discussion [Per primary team]  43 min cc time DErskine EmeryMD PCCM

## 2022-02-01 NOTE — Progress Notes (Signed)
Patient ID: Rita Cole, female   DOB: October 17, 1960, 61 y.o.   MRN: [id number]      Advanced Heart Failure Rounding Note  PCP-Cardiologist: None   Subjective:    This morning, patient is on milrinone 0.5 + NE 10, stable MAP.  He was switched off dobutamine 7.5 on 8/6. CVP 14-15.  Creatinine 4.22 => 3.22 => 1.49 => 1.14 => 1.55 => pending.  I/Os net negative 2180 cc on Lasix gtt 12 mg/hr.  Still sinus tachy around 130.   Still short of breath moving around. Still feeling heaviness in chest.   Plts 69K => 51K => 55K => 49.   ANA+, SSA+   Getting IV Solumedrol  [place]wan:  RA 14 PA 50/44 CI 2.2 Co-ox 46%  RHC (8/2): Hemodynamics (mmHg) RA mean 19 RV 53/19 PA 47/22, mean 32 PCWP mean 12 Oxygen saturations: PA 47% AO 97% Cardiac Output (Fick) 3.1  Cardiac Index (Fick) 1.54 PVR 6.45 WU PAPi 1.3   Echo: EF 55-60%, small compressed LV, D-shaped septum, severe RV dilation with severely decreased systolic function, IVC dilated.   Objective:   Weight Range: 91.9 kg Body mass index is 32.7 kg/m.   Vital Signs:   Temp:  [97.3 F (36.3 C)-99.3 F (37.4 C)] 98.4 F (36.9 C) (08/07 0607) Pulse Rate:  [120-138] 137 (08/07 0607) Resp:  [13-28] 22 (08/07 0607) BP: (106)/(62) 106/62 (08/07 0607) SpO2:  [86 %-99 %] 93 % (08/07 0607) Arterial Line BP: (81-114)/(51-70) 88/56 (08/07 0607) Weight:  [91.9 kg] 91.9 kg (08/07 0607) Last BM Date : 01/31/22  Weight change: Filed Weights   01/30/22 0458 01/31/22 0500 02/01/22 0607  Weight: 94.2 kg 91.5 kg 91.9 kg    Intake/Output:   Intake/Output Summary (Last 24 hours) at 02/01/2022 0742 Last data filed at 02/01/2022 0600 Gross per 24 hour  Intake 699.46 ml  Output 2880 ml  Net -2180.54 ml      Physical Exam    General: NAD Neck: JVP 14-16, no thyromegaly or thyroid nodule.  Lungs: Clear to auscultation bilaterally with normal respiratory effort. CV: Nondisplaced PMI.  Heart regular S1/S2, no S3/S4, no murmur.  1+  edema to knees.  Abdomen: Soft, nontender, no hepatosplenomegaly, no distention.  Skin: Intact without lesions or rashes.  Neurologic: Alert and oriented x 3.  Psych: Normal affect. Extremities: No clubbing or cyanosis.  HEENT: Normal.   Telemetry   NSR 130 (personally reviewed)   Labs    CBC Recent Labs    01/31/22 0442 02/01/22 0346  WBC 15.7* 17.3*  HGB 12.3 11.8*  HCT 37.2 34.7*  MCV 87.9 85.9  PLT 55* 49*   Basic Metabolic Panel Recent Labs    01/30/22 0432 01/31/22 0442 01/31/22 1341  NA 135 134*  --   K 3.5 4.4 4.1  CL 96* 93*  --   CO2 28 28  --   GLUCOSE 154* 138*  --   BUN 35* 48*  --   CREATININE 1.14* 1.55*  --   CALCIUM 8.8* 9.1  --   MG  --   --  2.5*   Liver Function Tests Recent Labs    01/30/22 0432 01/31/22 0442  AST 229* 207*  ALT 284* 250*  ALKPHOS 125 135*  BILITOT 1.3* 2.0*  PROT 6.6 7.0  ALBUMIN 3.2* 3.5   No results for input(s): "LIPASE", "AMYLASE" in the last 72 hours. Cardiac Enzymes No results for input(s): "CKTOTAL", "CKMB", "CKMBINDEX", "TROPONINI" in the last 72 hours.  BNP: BNP (last 3 results) Recent Labs    02/24/2022 1132  BNP 750.4*    ProBNP (last 3 results) No results for input(s): "PROBNP" in the last 8760 hours.   D-Dimer No results for input(s): "DDIMER" in the last 72 hours. Hemoglobin A1C Recent Labs    01/31/22 0442  HGBA1C 6.0*   Fasting Lipid Panel No results for input(s): "CHOL", "HDL", "LDLCALC", "TRIG", "CHOLHDL", "LDLDIRECT" in the last 72 hours. Thyroid Function Tests Recent Labs    01/29/22 1433  TSH 3.834    Other results:   Imaging    No results found.   Medications:     Scheduled Medications:  Chlorhexidine Gluconate Cloth  6 each Topical Daily   Chlorhexidine Gluconate Cloth  6 each Topical Daily   enoxaparin (LOVENOX) injection  30 mg Subcutaneous Q24H   insulin aspart  0-15 Units Subcutaneous TID WC   insulin aspart  0-5 Units Subcutaneous QHS   lidocaine  1  patch Transdermal QHS   methylPREDNISolone (SOLU-MEDROL) injection  125 mg Intravenous Daily   midodrine  5 mg Oral TID WC   pantoprazole  40 mg Oral Daily   sodium chloride flush  10-40 mL Intracatheter Q12H   sodium chloride flush  3 mL Intravenous Q12H   sodium chloride flush  3 mL Intravenous Q12H    Infusions:  sodium chloride     sodium chloride     sodium chloride     sodium chloride     sodium chloride     epoprostenol (VELETRI) for inhalation 1.78m/50mL (30,000 ng/mL)     furosemide (LASIX) 200 mg in dextrose 5 % 100 mL (2 mg/mL) infusion 12 mg/hr (02/01/22 0600)   milrinone 0.5 mcg/kg/min (02/01/22 01610   norepinephrine (LEVOPHED) Adult infusion 10 mcg/min (02/01/22 0610)    PRN Medications: sodium chloride, sodium chloride, Place/Maintain arterial line **AND** sodium chloride, Place/Maintain arterial line **AND** sodium chloride, acetaminophen, fentaNYL (SUBLIMAZE) injection, ondansetron (ZOFRAN) IV, mouth rinse, sodium chloride flush, sodium chloride flush, sodium chloride flush, traMADol   Assessment/Plan   1. Shock: Suspect cardiogenic due to RV failure.  Echo showed EF 55-60%, small compressed LV, D-shaped septum, severe RV dilation with severely decreased systolic function, IVC dilated.  Most recent previous echo in 7/22 was normal. RHC showed elevated RA pressure/RV failure with low PAPi and CI but normal PCWP.  There was moderate PAH with PVR 6.45.  Cause of RV failure is uncertain. CTA chest 7/31 did not show evidence for acute pulmonary embolus.  Elevated PVR suggests that there could be a pulmonary vascular issue causing the RV failure, but prior echo in 7/22 did not show signs of this.  Cannot fully rule out chronic PE without V/Q scan, would also consider pulmonary veno-occlusive disease in the setting of prior chemotherapy.  Also consider microthrombi from tumor spread.  We additionally found a case report of immune-checkpoint inhibitor-triggered pulmonary arterial  hypertension with RV failure, patient had pembrolizumab (an ICI) last in 12/22 and did not have an echo since 7/22 prior to this admission.  In the case report, the ICI was thought to trigger an SLE-like picture with the onset of PAH (+ANA) => patient found to have +ANA and +SSA.  Dobutamine transitioned to milrinone on 8/6 with persistently low co-ox.  She is currently on Norepinephrine 10 with milrinone 0.5.  MAP stable, CI 2.2, Co-ox still low at 46%.  Lactate was high yesterday am at 4. CVP 14-15.  PA 50/44. Ongoing sinus tachy noted.  She  is on Lasix gtt 12 and had metolazone yesterday, net negative around 2100 cc urine.  - Continue milrinone 0.5.     - Continue NE as needed to maintain MAP.  - Continue midodrine 5 tid.  - Unfortunately not making much progress, discussed with CCM and will give trial of inhaled NO.  - Continue Lasix gtt 12 mg/hr and give metolazone 2.5 x 1 again today. Need significant volume off to decrease RV size and thus decompress LV and improve hemodynamics. Suspect CVP 10-11 will be the best place for her given RV failure.  - I do not think she would be a good candidate for RV support device (Protek Duo or Impella RP).  She has elevated PVR suggesting pulmonary vasoconstriction that may respond poorly to increased flow into the PA.  - Will need V/Q scan to look for chronic PEs and high resolution CT chest to look for signs of PVOD when more stable and diuresed.  - Started on methylprednisolone x 5 days with elevated ANA and SSA.  2. Elevated troponin: Suspect demand ischemia from shock and RV failure.  Mild elevation without significant trend.  3. Headache: Severe at times, constant for several days.  CT head negative. Would avoid vasoconstrictor like Imitrex.  4. AKI: Creatinine up to 4.22 from 1.13 in setting of shock.  Mildly higher 1.55 yesterday, pending BMET today.  5. Elevated LFTs: New, suspect shock liver. Trending down.  6. Breast cancer: On left, diagnosed in  2022, had left mastectomy, chemotherapy, and XRT.  She had carboplatin, pembrolizumab, and paclitaxel.  CT chest in 7/23 was concerning for metastatic disease with T4 lesion and extensive lymphadenopathy. Oncology plan would be lymph node biopsy eventually, think she would have reasonable short-term prognosis as CT burden of potential metastatic disease would not be high.  7. Thrombocytopenia: ?Due to critical illness versus due to heparin (HIT, but seems too early).  - Off heparin gtt as she does not have a definitive indication.  - Can use Lovenox for DVT prophylaxis.    CRITICAL CARE Performed by: Loralie Champagne  Total critical care time: 45 minutes  Critical care time was exclusive of separately billable procedures and treating other patients.  Critical care was necessary to treat or prevent imminent or life-threatening deterioration.  Critical care was time spent personally by me on the following activities: development of treatment plan with patient and/or surrogate as well as nursing, discussions with consultants, evaluation of patient's response to treatment, examination of patient, obtaining history from patient or surrogate, ordering and performing treatments and interventions, ordering and review of laboratory studies, ordering and review of radiographic studies, pulse oximetry and re-evaluation of patient's condition.   Length of Stay: Dawson, MD  02/01/2022, 7:42 AM  Advanced Heart Failure Team Pager [phone] (M-F; 7a - 5p)  Please contact [facility] for night-coverage after hours (5p -7a ) and weekends on [email or web address]

## 2022-02-01 NOTE — Progress Notes (Signed)
Placed pt on HHFNC with NO per MD order

## 2022-02-02 DIAGNOSIS — R570 Cardiogenic shock: Secondary | ICD-10-CM | POA: Diagnosis not present

## 2022-02-02 DIAGNOSIS — Z7189 Other specified counseling: Secondary | ICD-10-CM | POA: Diagnosis not present

## 2022-02-02 DIAGNOSIS — N179 Acute kidney failure, unspecified: Secondary | ICD-10-CM | POA: Diagnosis not present

## 2022-02-02 DIAGNOSIS — I509 Heart failure, unspecified: Secondary | ICD-10-CM

## 2022-02-02 LAB — COMPREHENSIVE METABOLIC PANEL
ALT: 217 U/L — ABNORMAL HIGH (ref 0–44)
AST: 210 U/L — ABNORMAL HIGH (ref 15–41)
Albumin: 3.3 g/dL — ABNORMAL LOW (ref 3.5–5.0)
Alkaline Phosphatase: 150 U/L — ABNORMAL HIGH (ref 38–126)
Anion gap: 16 — ABNORMAL HIGH (ref 5–15)
BUN: 85 mg/dL — ABNORMAL HIGH (ref 6–20)
CO2: 25 mmol/L (ref 22–32)
Calcium: 8.7 mg/dL — ABNORMAL LOW (ref 8.9–10.3)
Chloride: 91 mmol/L — ABNORMAL LOW (ref 98–111)
Creatinine, Ser: 2.26 mg/dL — ABNORMAL HIGH (ref 0.44–1.00)
GFR, Estimated: 24 mL/min — ABNORMAL LOW (ref >60)
Glucose, Bld: 143 mg/dL — ABNORMAL HIGH (ref 70–99)
Potassium: 3.6 mmol/L (ref 3.5–5.1)
Sodium: 132 mmol/L — ABNORMAL LOW (ref 135–145)
Total Bilirubin: 3.7 mg/dL — ABNORMAL HIGH (ref 0.3–1.2)
Total Protein: 6.6 g/dL (ref 6.5–8.1)

## 2022-02-02 LAB — CBC
HCT: 35.3 % — ABNORMAL LOW (ref 36.0–46.0)
Hemoglobin: 11.7 g/dL — ABNORMAL LOW (ref 12.0–15.0)
MCH: 28.8 pg (ref 26.0–34.0)
MCHC: 33.1 g/dL (ref 30.0–36.0)
MCV: 86.9 fL (ref 80.0–100.0)
Platelets: 34 10*3/uL — ABNORMAL LOW (ref 150–400)
RBC: 4.06 MIL/uL (ref 3.87–5.11)
RDW: 18.8 % — ABNORMAL HIGH (ref 11.5–15.5)
WBC: 17.5 10*3/uL — ABNORMAL HIGH (ref 4.0–10.5)
nRBC: 10.2 % — ABNORMAL HIGH (ref 0.0–0.2)

## 2022-02-02 LAB — COOXEMETRY PANEL
Carboxyhemoglobin: 0.5 % (ref 0.5–1.5)
Methemoglobin: 0.9 % (ref 0.0–1.5)
O2 Saturation: 47.7 %
Total hemoglobin: 12.3 g/dL (ref 12.0–16.0)

## 2022-02-02 LAB — MAGNESIUM: Magnesium: 2.4 mg/dL (ref 1.7–2.4)

## 2022-02-02 LAB — GLUCOSE, CAPILLARY
Glucose-Capillary: 139 mg/dL — ABNORMAL HIGH (ref 70–99)
Glucose-Capillary: 130 mg/dL — ABNORMAL HIGH (ref 70–99)
Glucose-Capillary: 167 mg/dL — ABNORMAL HIGH (ref 70–99)
Glucose-Capillary: 115 mg/dL — ABNORMAL HIGH (ref 70–99)

## 2022-02-02 LAB — PHOSPHORUS: Phosphorus: 5.2 mg/dL — ABNORMAL HIGH (ref 2.5–4.6)

## 2022-02-02 MED ORDER — POTASSIUM CHLORIDE CRYS ER 20 MEQ PO TBCR
20.0000 meq | EXTENDED_RELEASE_TABLET | Freq: Once | ORAL | Status: AC
Start: 2022-02-02 — End: 2022-02-02
  Administered 2022-02-02: 20 meq via ORAL
  Filled 2022-02-02: qty 1

## 2022-02-02 MED ORDER — MIDODRINE HCL 5 MG PO TABS
10.0000 mg | ORAL_TABLET | Freq: Three times a day (TID) | ORAL | Status: DC
  Administered 2022-02-02 (×3): 10 mg via ORAL
  Filled 2022-02-02 (×3): qty 2

## 2022-02-02 MED ORDER — HYDROMORPHONE HCL 1 MG/ML IJ SOLN
1.0000 mg | INTRAMUSCULAR | Status: DC | PRN
  Administered 2022-02-02 – 2022-02-03 (×2): 1 mg via INTRAVENOUS
  Filled 2022-02-02 (×2): qty 1

## 2022-02-02 MED ORDER — MORPHINE SULFATE (PF) 4 MG/ML IV SOLN
4.0000 mg | INTRAVENOUS | Status: DC | PRN
  Administered 2022-02-02: 4 mg via INTRAVENOUS
  Filled 2022-02-02: qty 1

## 2022-02-02 NOTE — Progress Notes (Signed)
NAME:  Rita Cole, MRN:  [id number], DOB:  1961-03-03, LOS: 6 ADMISSION DATE:  02/19/2022, CONSULTATION DATE:  8/2 REFERRING MD:  Neena Rhymes FOR CONSULT: Medical management   History of Present Illness:  Rita Cole is a 61 y.o. F with a PMX of stage IIIc Breast cancer L breast, ER negative s/p chemo (carboplatin, paclitaxel, pembrolizumab) radiation and mastectomy, HTN, DM presented to ED 8/2 with SOB.  SOB had started a few weeks ago and was progressive such that on 7/31 pt presented to [facility] Ed in this setting. Associated chest discomfort. CTA chest r/o PE at that time but did have new RA and RV enlargement, as well as LUL consolidation, and extensive lymphadenopathy. She was dc home    Presented to Cancer center 8/1 and noted to be hypoxic, and was ordered home Oxygen, as well as an ECHO with concern for pHTN.    Pt then presented to Cancer center 8/2 for IVF and was found to be hypoxic and in respiratory distress during infusion. Rapid response was called and pt was transported to ED for Hypoxic, Hypotension, Resp Distress. In ED she was started on NE and O2 titrated. Labs revealed a new AKI and acute liver injury. There was concern for cardiogenic shock, so pt transferred [facility] ED - [facility] ED, where Adv HF was consulted for suspected cardiogenic shock.   STAT ECHO obtained and shows marked RV failure -- D shaped septum, severely reduced RV systolic fxn, severely elevated RV, mildly elevated PASP, RVSP 43.1  Patient with right sided cardiogenic shock and was planned for urgent RHC + swan placement    Admitting to Cardiology service with Adv HF and PCCM consulted in this setting   Pertinent  Medical History  She has a past medical history of L breast cancer (5/22, treated w/chemo and radition, s/p mastectomy), crohn's, GERD, HTN.  Significant Hospital Events: Including procedures, antibiotic start and stop dates in addition to other pertinent events   8/2 rapid response  at cancer center for hypoxia hypotension resp distress. ED to ED [facility] to [facility] for cards eval, found to have acute right sided heart failure with cardiogenic shock. Admit to cards, Adv HF and PCCM consult. Cath lab for [place] + swan.  Interim History / Subjective:  Remains weak and dyspneic with minimal activity.  Objective   Blood pressure 100/61, pulse (!) 132, temperature 97.9 F (36.6 C), resp. rate (!) 24, height 5' 6"  (1.676 m), weight 94.9 kg, SpO2 94 %. PAP: (42-64)/(36-59) 53/50 CVP:  [11 mmHg-22 mmHg] 14 mmHg CO:  [2.8 L/min-3.1 L/min] 3 L/min CI:  [2.2 L/min/m2-2.4 L/min/m2] 2.3 L/min/m2  FiO2 (%):  [60 %-100 %] 60 %   Intake/Output Summary (Last 24 hours) at 02/02/2022 1423 Last data filed at 02/02/2022 0700 Gross per 24 hour  Intake 717.65 ml  Output 1111 ml  Net -393.35 ml    Filed Weights   01/31/22 0500 02/01/22 0607 02/02/22 0500  Weight: 91.5 kg 91.9 kg 94.9 kg    Examination: Chronically ill appearing Weak Ext lukewarm Moves to command AOx3  Coox 47% CBG ok Cr worsening, CVP remains markedly up despite trial of iNO On 100% 35LPM  Resolved Hospital Problem list     Assessment & Plan:  Decompensated RV failure with cardiorenal syndrome, congestive hepatopathy, profound hypoxemia, cardiogenic shock, 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  Remains pressor  and inotrope dependent. 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  Will wean iNO.  Add morphine PRN dyspnea/pain.  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  Repeat CT late 7/31 concerning for metastases -Oncology is following, plan for axillary lymph node biopsy; in light of above, recommend transition to comfort instead   Best Practice (right click and "Reselect all SmartList Selections" daily)   Diet/type: Regular consistency DVT prophylaxis: off for worsening thrombocytopenia GI prophylaxis: PPI Lines: Central line and yes and it is still needed Foley:  Yes, and it is still needed Code Status:  full code Last date of multidisciplinary goals of care discussion [Per primary team]  35 min cc time DErskine EmeryMD PCCM

## 2022-02-02 NOTE — Consult Note (Signed)
Consultation Note Date: 02/02/2022   Patient Name: Rita Cole  DOB: March 04, 1961  MRN: [id number]  Age / Sex: 61 y.o., female  PCP: Riki Sheer, NP Referring Physician: Larey Dresser, MD  Reason for Consultation:  GOC breast cancer and pulmonary hypertension  HPI/Patient Profile: 61 y.o. female  with past medical history of stage IIIc breast cancer, status post carboplatin paclitaxel and pembrolizumab, radiation and mastectomy-was in remission but was under evaluation for possible recurrence prior to admission due to CT scan showing multiple areas of lymphadenopathy and new sclerotic lesion at T4. The CT scan was also concerning for enlargement of the right atrium and the right ventricle this was new since prior exam in April 2022, concerning for pulmonary arterial hypertension as well as LUL consolidation.  She was admitted on 8/2 after presenting to the ED with shortness of breath she she was hypotensive had elevated BNP elevated LFTs serum creatinine trended up as well as trending up serial troponins, concerns for cardiogenic shock. Per HF team note she has intractable RV failure, worsening cardiorenal failure, she is not improving with current therapies. Comfort/hospice is recommended.    Primary Decision Maker PATIENT  Discussion: Chart reviewed including labs, progress notes, imaging.  I met with the patient, her daughter, and her mother.  Patient shares that she understands the doctors have told her they do not believe there is more they can offer in efforts to fix her medical problems. She is hoping for more time to spend with family members.  Her daughter and mother were at the bedside and had many questions regarding patient's illnesses and prognosis- I reviewed chart with them and answered their questions.  We discussed what transition to comfort measures would look like.  Code status  was discussed.  Patient would like to continue current measures as long as she can be awake and interacting with her family. If she becomes more uncomfortable or not able to arouse, then she would want for transition to full comfort measures only.  She is in agreement with change to DNR code status understanding this means that if her heart stops and she stops breathing- she would not want CPR or to be placed on ventilator.     SUMMARY OF RECOMMENDATIONS -Continue current interventions -Patient's GOC are to continue to spend time interacting with family members  -Changed morphine to hydromorphone- avoid morphine in kidney injury -If patient becomes increasingly uncomfortable or her mental status changes, then she agrees to transition to comfort measures only     Code Status/Advance Care Planning: DNR   Prognosis:   Hours - Days  Discharge Planning: Anticipated Hospital Death  Primary Diagnoses: Present on Admission:  Cardiogenic shock ([place])   Review of Systems  Physical Exam  Vital Signs: BP 108/73   Pulse (!) 132   Temp 97.9 F (36.6 C)   Resp (!) 26   Ht 5' 6"  (1.676 m)   Wt 94.9 kg   SpO2 95%   BMI 33.77 kg/m  Pain Scale: 0-10  POSS *See Group Information*: 1-Acceptable,Awake and alert Pain Score: 2    SpO2: SpO2: 95 % O2 Device:SpO2: 95 % O2 Flow Rate: .O2 Flow Rate (L/min): 20 L/min  IO: Intake/output summary:  Intake/Output Summary (Last 24 hours) at 02/02/2022 1413 Last data filed at 02/02/2022 1200 Gross per 24 hour  Intake 696.61 ml  Output 1060 ml  Net -363.39 ml    LBM: Last BM Date : 02/01/22 Baseline Weight: Weight: 92.1 kg Most recent weight: Weight: 94.9 kg       Thank you for this consult. Palliative medicine will continue to follow and assist as needed.  Time Total: 90 minutes Greater than 50%  of this time was spent counseling and coordinating care related to the above assessment and plan.  Signed by: Mariana Kaufman, AGNP-C Palliative  Medicine    Please contact Palliative Medicine Team phone at [phone] for questions and concerns.  For individual provider: See Shea Evans

## 2022-02-02 NOTE — Progress Notes (Signed)
NO wean started at 0804 from 30ppm per PCCM.

## 2022-02-02 NOTE — Progress Notes (Addendum)
Patient ID: Rita Cole, female   DOB: Mar 23, 1961, 61 y.o.   MRN: [id number]      Advanced Heart Failure Rounding Note  PCP-Cardiologist: None   Subjective:    This morning, patient is on milrinone 0.5 + NE 10, stable MAP.  She was switched off dobutamine 7.5 on 8/6.   8/7 Started on iNO  Remains on lasix 12 mg per hour sluggish urine output. Also on milrinone 0.5 + Norepi 17.    Creatinine 4.22 => 3.22 => 1.49 => 1.14 => 1.55 => 1.9>2.26. Sluggish urine output   Plts 69K => 51K => 55K => 49=>34. On lovenox.   ANA+, SSA+   Day 5/5 IV Solumedrol.   Remains SOB with minimal exertion.   Swan:  RA 16 PA 55/52 CI 2.3 Co-ox 48%   RHC (8/2): Hemodynamics (mmHg) RA mean 19 RV 53/19 PA 47/22, mean 32 PCWP mean 12 Oxygen saturations: PA 47% AO 97% Cardiac Output (Fick) 3.1  Cardiac Index (Fick) 1.54 PVR 6.45 WU PAPi 1.3   Echo: EF 55-60%, small compressed LV, D-shaped septum, severe RV dilation with severely decreased systolic function, IVC dilated.   Objective:   Weight Range: 94.9 kg Body mass index is 33.77 kg/m.   Vital Signs:   Temp:  [97.5 F (36.4 C)-98.1 F (36.7 C)] 97.7 F (36.5 C) (08/08 0600) Pulse Rate:  [123-137] 128 (08/08 0600) Resp:  [0-25] 21 (08/08 0600) BP: (73-100)/(57-61) 100/61 (08/07 1356) SpO2:  [90 %-99 %] 95 % (08/08 0600) Arterial Line BP: (74-113)/(49-70) 97/60 (08/08 0600) FiO2 (%):  [60 %-100 %] 60 % (08/08 0147) Weight:  [94.9 kg] 94.9 kg (08/08 0500) Last BM Date : 02/01/22  Weight change: Filed Weights   01/31/22 0500 02/01/22 0607 02/02/22 0500  Weight: 91.5 kg 91.9 kg 94.9 kg    Intake/Output:   Intake/Output Summary (Last 24 hours) at 02/02/2022 0659 Last data filed at 02/02/2022 0600 Gross per 24 hour  Intake 740.24 ml  Output 1211 ml  Net -470.76 ml      Physical Exam   CVP 16 General:   No resp difficulty HEENT: normal Neck: supple. JVP to jaw  Carotids 2+ bilat; no bruits. No lymphadenopathy  or thryomegaly appreciated. RIJ  Cor: PMI nondisplaced. Tachy regular rate & rhythm. No rubs, gallops or murmurs. Lungs: clear on HFNC 20 liter + iNO Abdomen: soft, nontender, nondistended. No hepatosplenomegaly. No bruits or masses. Good bowel sounds. Extremities: no cyanosis, clubbing, rash, edema Neuro: alert & orientedx3, cranial nerves grossly intact. moves all 4 extremities w/o difficulty. Affect pleasant  Telemetry  ST 120-130s one episode 5 beats NSVT.    Labs    CBC Recent Labs    02/01/22 0346 02/02/22 0500  WBC 17.3* 17.5*  HGB 11.8* 11.7*  HCT 34.7* 35.3*  MCV 85.9 86.9  PLT 49* 34*   Basic Metabolic Panel Recent Labs    01/31/22 1341 02/01/22 0827 02/02/22 0500  NA  --  133* 132*  K 4.1 4.0 3.6  CL  --  93* 91*  CO2  --  24 25  GLUCOSE  --  118* 143*  BUN  --  68* 85*  CREATININE  --  1.93* 2.26*  CALCIUM  --  9.0 8.7*  MG 2.5*  --  2.4  PHOS  --   --  5.2*   Liver Function Tests Recent Labs    02/01/22 0827 02/02/22 0500  AST 183* 210*  ALT 210* 217*  ALKPHOS 139* 150*  BILITOT 3.0* 3.7*  PROT 6.6 6.6  ALBUMIN 3.4* 3.3*   No results for input(s): "LIPASE", "AMYLASE" in the last 72 hours. Cardiac Enzymes No results for input(s): "CKTOTAL", "CKMB", "CKMBINDEX", "TROPONINI" in the last 72 hours.  BNP: BNP (last 3 results) Recent Labs    02/01/2022 1132  BNP 750.4*    ProBNP (last 3 results) No results for input(s): "PROBNP" in the last 8760 hours.   D-Dimer No results for input(s): "DDIMER" in the last 72 hours. Hemoglobin A1C Recent Labs    01/31/22 0442  HGBA1C 6.0*   Fasting Lipid Panel No results for input(s): "CHOL", "HDL", "LDLCALC", "TRIG", "CHOLHDL", "LDLDIRECT" in the last 72 hours. Thyroid Function Tests No results for input(s): "TSH", "T4TOTAL", "T3FREE", "THYROIDAB" in the last 72 hours.  Invalid input(s): "FREET3"   Other results:   Imaging    VAS [place] LOWER EXTREMITY VENOUS (DVT)  Result Date: 02/01/2022   Lower Venous DVT Study [facility]  Date of Exam:   02/01/2022 Medical Rec #: [id number]               Accession #:    [id number] Date of Birth: 10-30-1960               Patient Gender: F Patient Age:   67 years Exam Location:  [facility] Procedure:      VAS [place] LOWER EXTREMITY VENOUS (DVT) Referring Phys: Ina Homes --------------------------------------------------------------------------------  Indications: Edema.  Risk Factors: Cancer history - breast. Anticoagulation: Heparin. Comparison Study: No prior lower extremity studies. Performing Technologist: Darlin Coco RDMS, RVT  Examination Guidelines: A complete evaluation includes B-mode imaging, spectral Doppler, color Doppler, and power Doppler as needed of all accessible portions of each vessel. Bilateral testing is considered an integral part of a complete examination. Limited examinations for reoccurring indications may be performed as noted. The reflux portion of the exam is performed with the patient in reverse Trendelenburg.  +---------+---------------+---------+-----------+----------+--------------+ RIGHT    CompressibilityPhasicitySpontaneityPropertiesThrombus Aging +---------+---------------+---------+-----------+----------+--------------+ CFV      Full           No       Yes                                 +---------+---------------+---------+-----------+----------+--------------+ SFJ      Full                                                        +---------+---------------+---------+-----------+----------+--------------+ FV Prox  Full                                                        +---------+---------------+---------+-----------+----------+--------------+ FV Mid   Full                                                        +---------+---------------+---------+-----------+----------+--------------+ FV DistalFull                                                         +---------+---------------+---------+-----------+----------+--------------+  PFV      Full                                                        +---------+---------------+---------+-----------+----------+--------------+ POP      Full           No       Yes                                 +---------+---------------+---------+-----------+----------+--------------+ PTV      Full                                                        +---------+---------------+---------+-----------+----------+--------------+ PERO     Full                                                        +---------+---------------+---------+-----------+----------+--------------+ Gastroc  Full                                                        +---------+---------------+---------+-----------+----------+--------------+   +---------+---------------+---------+-----------+----------+--------------+ LEFT     CompressibilityPhasicitySpontaneityPropertiesThrombus Aging +---------+---------------+---------+-----------+----------+--------------+ CFV      Full           No       Yes                                 +---------+---------------+---------+-----------+----------+--------------+ SFJ      Full                                                        +---------+---------------+---------+-----------+----------+--------------+ FV Prox  Full                                                        +---------+---------------+---------+-----------+----------+--------------+ FV Mid   Full                                                        +---------+---------------+---------+-----------+----------+--------------+ FV DistalFull                                                        +---------+---------------+---------+-----------+----------+--------------+  PFV      Full                                                         +---------+---------------+---------+-----------+----------+--------------+ POP      Full           No       Yes                                 +---------+---------------+---------+-----------+----------+--------------+ PTV      Full                                                        +---------+---------------+---------+-----------+----------+--------------+ PERO     Full                                                        +---------+---------------+---------+-----------+----------+--------------+ Gastroc  Full                                                        +---------+---------------+---------+-----------+----------+--------------+     Summary: RIGHT: - There is no evidence of deep vein thrombosis in the lower extremity.  - No cystic structure found in the popliteal fossa.  - Pulsatile waveforms throughout lower extremity venous system consistent with elevated right heart pressures.  LEFT: - There is no evidence of deep vein thrombosis in the lower extremity.  - No cystic structure found in the popliteal fossa.  - Pulsatile waveforms throughout lower extremity venous system consistent with elevated right heart pressures.  *See table(s) above for measurements and observations. Electronically signed by Servando Snare MD on 02/01/2022 at 1:36:04 PM.    Final    DG CHEST PORT 1 VIEW  Result Date: 02/01/2022 CLINICAL DATA:  Congestive heart failure. EXAM: PORTABLE CHEST 1 VIEW COMPARISON:  February 02, 2022.  January 25, 2022. FINDINGS: Stable cardiomediastinal silhouette. Stable right internal jugular Swan-Ganz catheter is noted. Right lung is clear. No pneumothorax is noted. Stable peripheral opacity is noted in left upper lobe most consistent with pneumonia or post radiation fibrosis. Bony thorax is unremarkable. IMPRESSION: Stable peripheral opacity seen in left upper lobe most consistent with pneumonia or post radiation fibrosis. Electronically Signed   By: Marijo Conception M.D.    On: 02/01/2022 08:41     Medications:     Scheduled Medications:  Chlorhexidine Gluconate Cloth  6 each Topical Daily   Chlorhexidine Gluconate Cloth  6 each Topical Daily   enoxaparin (LOVENOX) injection  30 mg Subcutaneous Q24H   insulin aspart  0-15 Units Subcutaneous TID WC   insulin aspart  0-5 Units Subcutaneous QHS   lidocaine  1 patch Transdermal QHS   methylPREDNISolone (SOLU-MEDROL) injection  125 mg Intravenous Daily   midodrine  5 mg Oral TID WC   pantoprazole  40 mg Oral Daily   sodium chloride flush  10-40 mL Intracatheter Q12H   sodium chloride flush  3 mL Intravenous Q12H   sodium chloride flush  3 mL Intravenous Q12H    Infusions:  sodium chloride     sodium chloride     sodium chloride     sodium chloride     sodium chloride     furosemide (LASIX) 200 mg in dextrose 5 % 100 mL (2 mg/mL) infusion 12 mg/hr (02/02/22 0600)   milrinone 0.5 mcg/kg/min (02/02/22 0610)   norepinephrine (LEVOPHED) Adult infusion 17 mcg/min (02/02/22 0600)    PRN Medications: sodium chloride, sodium chloride, Place/Maintain arterial line **AND** sodium chloride, Place/Maintain arterial line **AND** sodium chloride, acetaminophen, fentaNYL (SUBLIMAZE) injection, ondansetron (ZOFRAN) IV, mouth rinse, sodium chloride flush, sodium chloride flush, sodium chloride flush, traMADol   Assessment/Plan   1. Shock: Suspect cardiogenic due to RV failure.  Echo showed EF 55-60%, small compressed LV, D-shaped septum, severe RV dilation with severely decreased systolic function, IVC dilated.  Most recent previous echo in 7/22 was normal. RHC showed elevated RA pressure/RV failure with low PAPi and CI but normal PCWP.  There was moderate PAH with PVR 6.45.  Cause of RV failure is uncertain. CTA chest 7/31 did not show evidence for acute pulmonary embolus.  Elevated PVR suggests that there could be a pulmonary vascular issue causing the RV failure, but prior echo in 7/22 did not show signs of this.   Cannot fully rule out chronic PE without V/Q scan, would also consider pulmonary veno-occlusive disease in the setting of prior chemotherapy.  Also consider microthrombi from tumor spread.  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  In the case report, the ICI was thought to trigger an SLE-like picture with the onset of PAH (+ANA) => patient found to have +ANA and +SSA.  Dobutamine transitioned to milrinone on 8/6 with persistently low co-ox.  She is currently on Norepinephrine 17 with milrinone 0.5.  CO-OX 48% CI 2.3.  - Sluggish diuresis with lasix drip+ metolazone and with worsening renal function. .  - Continue milrinone 0.5.     - Continue NE to maintain MAP.  - Increase midodrine 10 mg tid. .  - Unfortunately not making much progress, discussed with CCM started on iNO.  -Suspect CVP 10-11 will be the best place for her given RV failure.  -Not thought to be good candidate for RV support device (Protek Duo or Impella RP).  She has elevated PVR suggesting pulmonary vasoconstriction that may respond poorly to increased flow into the PA.  - Will need V/Q scan to look for chronic PEs and high resolution CT chest to look for signs of PVOD when more stable and diuresed.  - Completing 5th day of solumedrol.   2. Elevated troponin: Suspect demand ischemia from shock and RV failure.  Mild elevation without significant trend.  3. Headache: Severe at times, constant for several days.  CT head negative. Would avoid vasoconstrictor like Imitrex.  4. AKI: Creatinine up to 4.22 from 1.13 in setting of shock.  Continues to worsen. Creatinine up to 2.26   5. Elevated LFTs: New, suspect shock liver. LFTs slowly trending up.   6. Breast cancer: On left, diagnosed in 2022, had left mastectomy, chemotherapy, and  XRT.  She had  carboplatin, pembrolizumab, and paclitaxel.  CT chest in 7/23 was concerning for metastatic disease with T4 lesion and extensive lymphadenopathy. Oncology plan would be lymph node biopsy eventually, think she would have reasonable short-term prognosis as CT burden of potential metastatic disease would not be high.  7. Thrombocytopenia: ?Due to critical illness versus due to heparin (HIT, but seems too early).  - Off heparin gtt as she does not have a definitive indication.  - Platelets are down to 34. Check HIT.  - Hold lovenox. Place ted hose. 8. ID Blood Cx 8/4 - NGTD WBC 17.5. Day 5 of solumedrol.  9. GOC Need to consider Palliative Care consult for [place].    Length of Stay: [place], NP  02/02/2022, 6:59 AM  Advanced Heart Failure Team Pager [phone] (M-F; 7a - 5p)  Please contact [facility] for night-coverage after hours (5p -7a ) and weekends on [email or web address]   Patient seen with NP, agree with the above note.   Co-ox 48% today with CI 2.3 from [place].  CVP 16, UOP not vigorous on Lasix gtt 12 mg/hr.  She remains on milrinone 0.5 and NE 17.  BUN/creatinine rising.  She is now on NO 30 ppm without much response.   Chest feels heavy, dyspnea with movement.   Plts down to 34K.   General: NAD Neck: JVP 16 cm, no thyromegaly or thyroid nodule.  Lungs: Clear to auscultation bilaterally with normal respiratory effort. CV: Nondisplaced PMI.  Heart regular S1/S2, no S3/S4, no murmur.  1+ ankle edema.  Abdomen: Soft, nontender, no hepatosplenomegaly, no distention.  Skin: Intact without lesions or rashes.  Neurologic: Alert and oriented x 3.  Psych: Normal affect. Extremities: No clubbing or cyanosis.  HEENT: Normal.   Unfortunately, not making much progress.  She has intractable RV failure with worsening creatinine.  She has not responded to iNO.  Still volume overloaded with low co-ox though CI from thermo is ok.  I do not see that we have any other viable options for her at this  point.  Dr. Tamala Julian and I discussed consideration of comfort/hospice care with her today.  We will continue the current therapies for now and will ask for a palliative care consult.   CRITICAL CARE Performed by: Loralie Champagne  Total critical care time: 40 minutes  Critical care time was exclusive of separately billable procedures and treating other patients.  Critical care was necessary to treat or prevent imminent or life-threatening deterioration.  Critical care was time spent personally by me on the following activities: development of treatment plan with patient and/or surrogate as well as nursing, discussions with consultants, evaluation of patient's response to treatment, examination of patient, obtaining history from patient or surrogate, ordering and performing treatments and interventions, ordering and review of laboratory studies, ordering and review of radiographic studies, pulse oximetry and re-evaluation of patient's condition.  Loralie Champagne. 02/02/2022 7:55 AM

## 2022-02-02 NOTE — TOC CM/SW Note (Signed)
HF TOC CM spoke to pt, dtr, and mother at bedside. They are praying and hopeful for a miracle. Explained TOC CM will continue to assist with needs. [place], Heart Failure TOC CM [phone]

## 2022-02-03 ENCOUNTER — Ambulatory Visit: Payer: Federal, State, Local not specified - PPO | Admitting: Hematology and Oncology

## 2022-02-03 LAB — CULTURE, BLOOD (ROUTINE X 2)
Culture: NO GROWTH
Culture: NO GROWTH
Special Requests: ADEQUATE

## 2022-02-03 LAB — HEPARIN INDUCED PLATELET AB (HIT ANTIBODY): Heparin Induced Plt Ab: 0.064 OD (ref 0.000–0.400)

## 2022-02-04 ENCOUNTER — Ambulatory Visit: Payer: Federal, State, Local not specified - PPO | Admitting: Physical Therapy

## 2022-02-05 LAB — SEROTONIN RELEASE ASSAY (SRA)
SRA .2 IU/mL UFH Ser-aCnc: <1 % (ref 0–20)
SRA 100IU/mL UFH Ser-aCnc: <1 % (ref 0–20)

## 2022-02-08 ENCOUNTER — Ambulatory Visit: Payer: Federal, State, Local not specified - PPO | Admitting: Hematology and Oncology

## 2022-02-26 NOTE — Progress Notes (Signed)
Time of Death 11 pronounced by Karl Luke, RN & Hilarie Fredrickson, RN.  Family present.  Attending MD notified.

## 2022-02-26 NOTE — Death Summary Note (Signed)
  Advanced Heart Failure Death Summary  Death Summary   Patient ID: Rita Cole MRN: [id number], DOB/AGE: 60-Dec-1962 60 y.o. Admit date: 02/09/2022 D/C date:     2022/02/18   Primary Discharge Diagnoses:  Cardiogenic shock RV failure Elevated troponin AKI Shock liver Breast cancer Thrombocytopenia  Hospital Course:   61 y/o AAF w/ h/o HTN, pre-diabetes and left sided breast cancer, diagnosed 4/22, treated w/ chemo + radiation, followed by mastectomy. Chemo regimen = Pembrolizumab + Carboplatin D1 + Paclitaxel .   Baseline echo obtained prior to initiation of chemo 4/22. This showed normal LVEF and RV.   3 month surveillance echo 7/22 showed EF 60-65% w/ LV global longitudinal strain -18.2 %, normal RV.   Completed tx. Had been in remission.   Seen by oncology 7/24 for new onselt left chest wall pain, swelling, tenderness and ROM difficulty. Chest CT showed new mediastinal, hilar, lower cervical, and right axillary lymphadenopathy, and new sclerotic lesion of T4, concerning for disease recurrence/metastases. There was also enlargement of right atrium and right ventricle, new since the prior exam in April 2022, c/w PAH but no acute PE.   Several days later developed worsening CP and dyspnea. Came to [facility] ED, found to be hypotensive. She was transferred to [facility] 08/02 for suspected cardiogenic shock and [facility] consultation. Echo demonstrated preserved EF and severely reduced RV function. RHC showed elevated RA pressure/RV failure with low PAPi and CI but normal PCWP. Moderate PAH with PVR 6.45. Etiology of RV failure unclear. No PE on CTA. Pulmonary veno-occlusive disease in setting of prior chemo considered. 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   She was started on dobutamine which was later switched to milrinone d/t low co-ox. Norepinephrine later added, with marginal CI and low co-ox. Sluggish diuresis  with lasix gtt + metolazone. No improvement with trial on iNO. Course further complicated by progressive renal dysfunction, shock liver and thrombocytopenia.    In view of MSOF and lack of viable options, palliative care consulted 08/08 for [place] discussion. Code status changed to DNR.  She passed at 02:52 am on 02-18-2022 with family present at bedside.  Significant Diagnostic Studies  RHC 02/20/2022: 1. Elevated right heart filling pressures with normal PCWP.  2. Predominantly RV failure with low PAPi and low cardiac index 1.54. 3. Moderate pulmonary arterial hypertension with PVR 6.45 WU  Echo 02/10/2022: EF 55-60%, D shaped interventricular septum, RV severely reduced, RVSP 43 mmhg, mild to moderate MR, dilated IVC  CTA chest 01/25/22: IMPRESSION: Postsurgical changes of left mastectomy, with skin thickening and subcutaneous soft tissue swelling which may be related to residual disease or post radiation change. New mediastinal, hilar, lower cervical, and right axillary lymphadenopathy, and new sclerotic lesion of T4, concerning for disease recurrence/metastases. Prominent lower cervical lymph nodes as well.   Left upper lobe peripheral consolidation, which could be pneumonia or sequela of evolving post radiation change.   No evidence of pulmonary embolism.   New right atrial and right ventricular enlargement, suggesting pulmonary hypertension. Recommend correlation with echocardiogram. New trace pericardial effusion.   Consultations  Oncology Pulmonary/Critical Care Medicine Palliative Care  Duration of Discharge Encounter: Greater than 35 minutes   Signed, Leata Mouse, PA-C 02/18/22, 4:35 PM

## 2022-02-26 DEATH — deceased

## 2022-03-08 ENCOUNTER — Ambulatory Visit: Payer: Federal, State, Local not specified - PPO

## 2022-04-22 IMAGING — CR DG HIP (WITH OR WITHOUT PELVIS) 2-3V*L*
3 series · 3 of 3 positions shown · non-contrast
Comparison: None.

CLINICAL DATA: Left-sided groin pain

EXAM:
DG HIP (WITH OR WITHOUT PELVIS) 2-3V LEFT

[t pelvis ap]
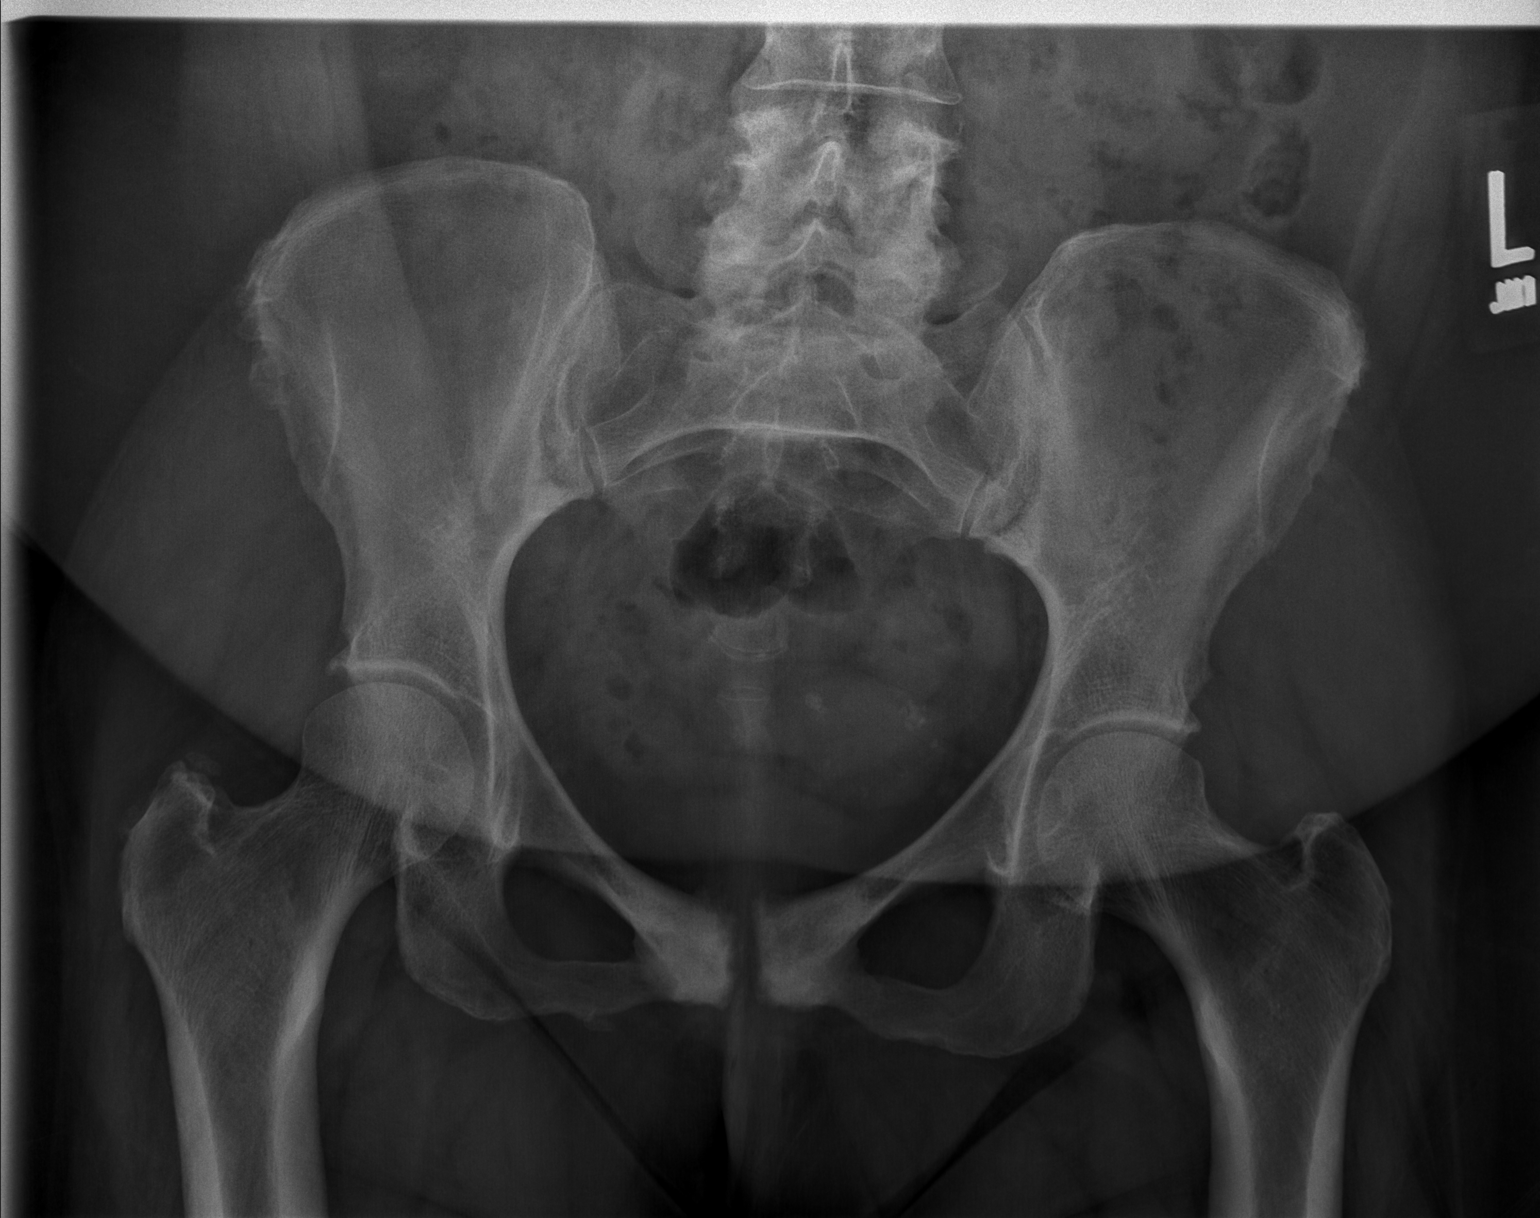

[t hip ap left]
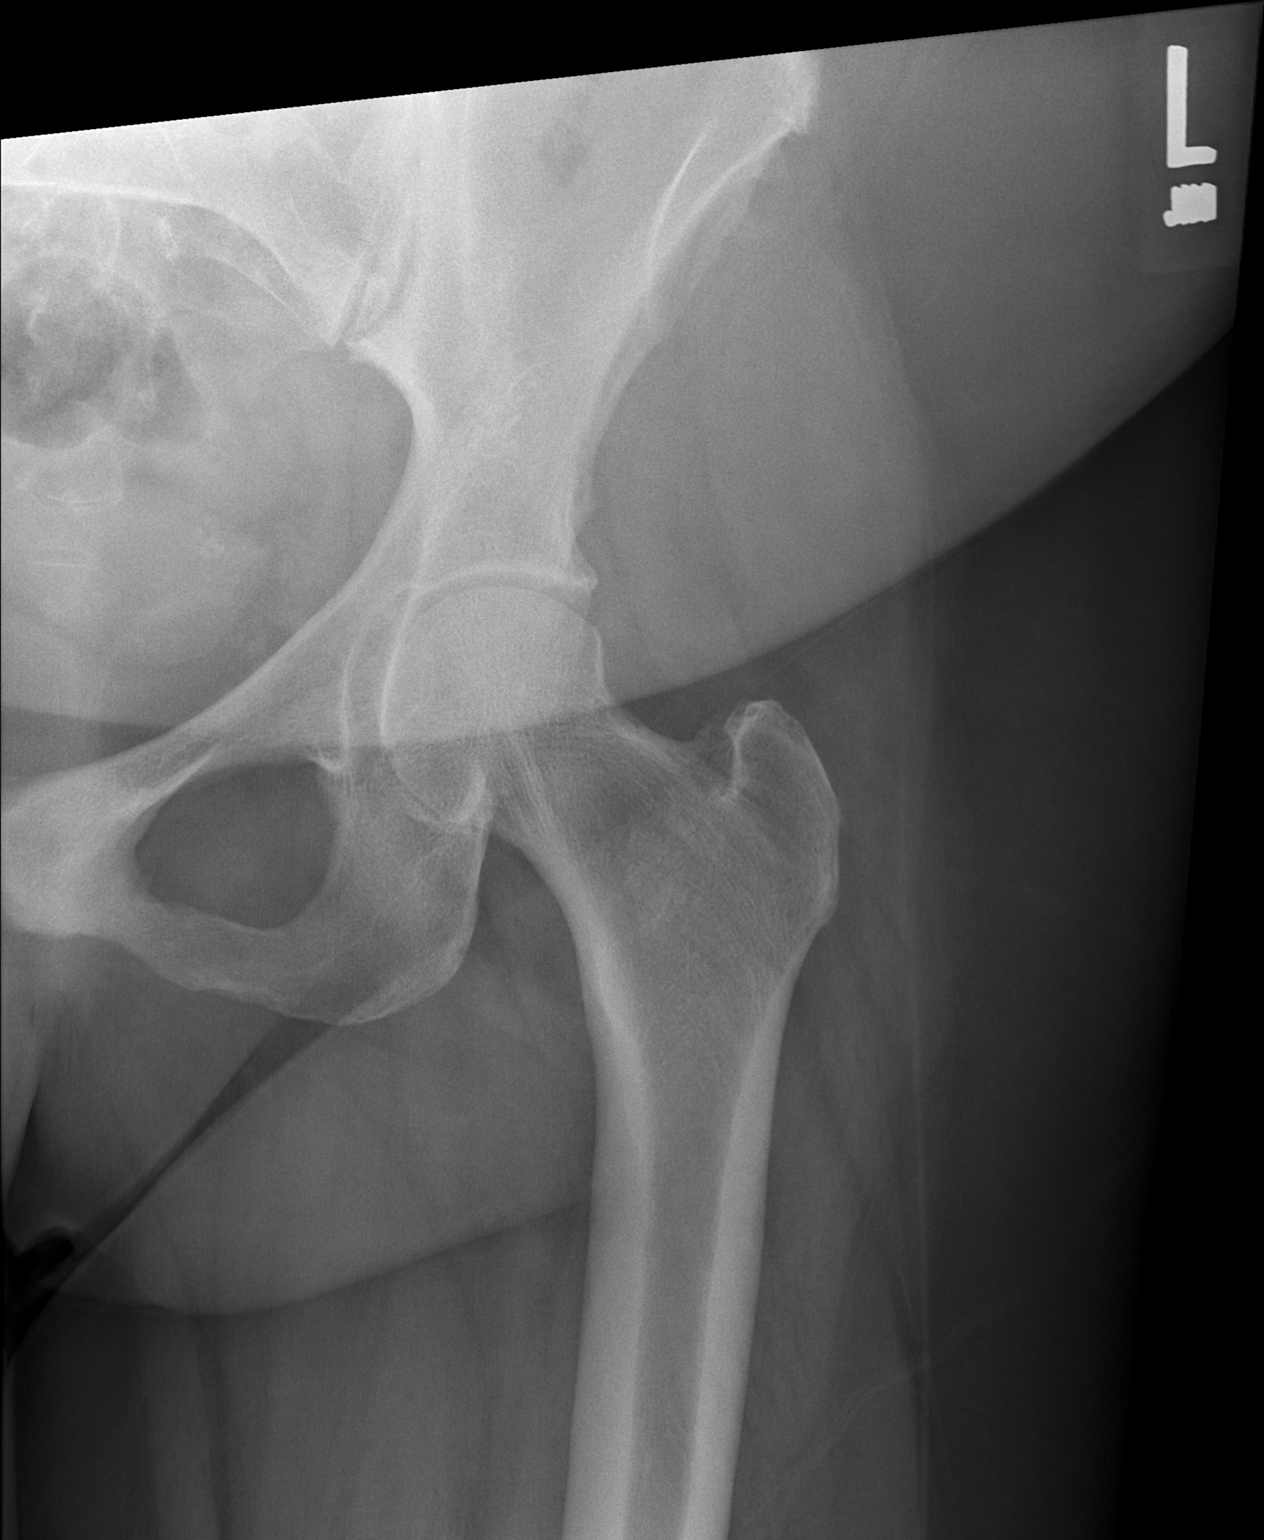

[t hip frog leg left]
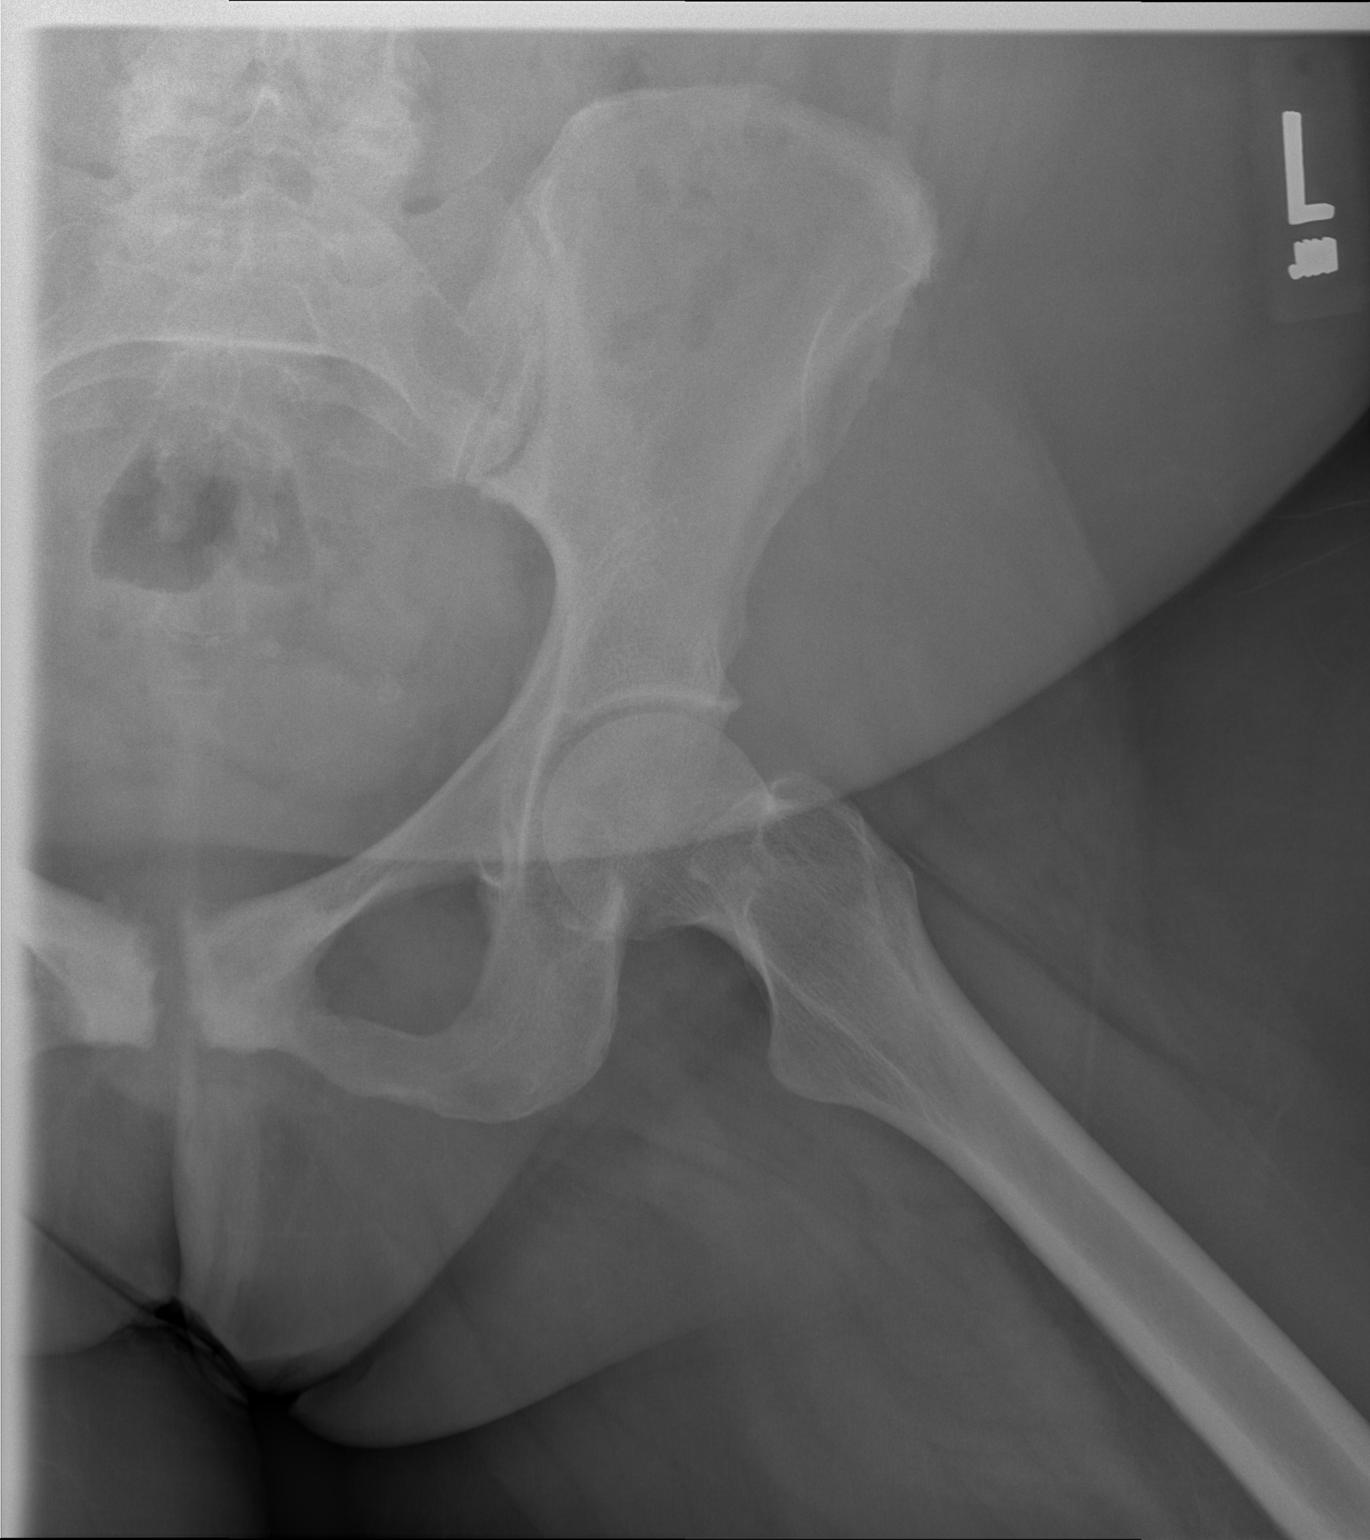

[3 of 3 positions shown; findings below may reference images not displayed]

FINDINGS: There is no evidence of hip fracture or dislocation. There is no
evidence of arthropathy or other focal bone abnormality.
IMPRESSION: Negative.

## 2023-05-18 NOTE — Telephone Encounter (Signed)
Telephone call
